# Patient Record
Sex: Female | Born: 1940 | Race: White | Hispanic: No | Marital: Married | Smoking: Former smoker
Health system: Southern US, Community
[De-identification: ages and names within clinical notes are randomized; demographics above are authoritative.]

## PROBLEM LIST (undated history)

## (undated) DIAGNOSIS — N189 Chronic kidney disease, unspecified: Secondary | ICD-10-CM

## (undated) DIAGNOSIS — F32A Depression, unspecified: Secondary | ICD-10-CM

## (undated) DIAGNOSIS — Z9889 Other specified postprocedural states: Secondary | ICD-10-CM

## (undated) DIAGNOSIS — M4302 Spondylolysis, cervical region: Secondary | ICD-10-CM

## (undated) DIAGNOSIS — E8881 Metabolic syndrome: Secondary | ICD-10-CM

## (undated) DIAGNOSIS — E785 Hyperlipidemia, unspecified: Secondary | ICD-10-CM

## (undated) DIAGNOSIS — E88819 Insulin resistance, unspecified: Secondary | ICD-10-CM

## (undated) DIAGNOSIS — J45909 Unspecified asthma, uncomplicated: Secondary | ICD-10-CM

## (undated) DIAGNOSIS — I739 Peripheral vascular disease, unspecified: Secondary | ICD-10-CM

## (undated) DIAGNOSIS — R55 Syncope and collapse: Secondary | ICD-10-CM

## (undated) DIAGNOSIS — M1990 Unspecified osteoarthritis, unspecified site: Secondary | ICD-10-CM

## (undated) DIAGNOSIS — D751 Secondary polycythemia: Secondary | ICD-10-CM

## (undated) DIAGNOSIS — F329 Major depressive disorder, single episode, unspecified: Secondary | ICD-10-CM

## (undated) DIAGNOSIS — I10 Essential (primary) hypertension: Secondary | ICD-10-CM

## (undated) DIAGNOSIS — R112 Nausea with vomiting, unspecified: Secondary | ICD-10-CM

## (undated) DIAGNOSIS — E063 Autoimmune thyroiditis: Secondary | ICD-10-CM

## (undated) DIAGNOSIS — F419 Anxiety disorder, unspecified: Secondary | ICD-10-CM

## (undated) HISTORY — PX: CHOLECYSTECTOMY: SHX55

## (undated) HISTORY — PX: BREAST SURGERY: SHX581

## (undated) HISTORY — PX: APPENDECTOMY: SHX54

## (undated) HISTORY — DX: Essential (primary) hypertension: I10

## (undated) HISTORY — PX: OVARY SURGERY: SHX727

## (undated) HISTORY — PX: TONSILLECTOMY: SUR1361

## (undated) HISTORY — DX: Hyperlipidemia, unspecified: E78.5

## (undated) HISTORY — PX: DENTAL SURGERY: SHX609

---

## 2009-11-30 ENCOUNTER — Encounter: Admission: RE | Admit: 2009-11-30 | Discharge: 2009-11-30 | Payer: Self-pay | Admitting: Internal Medicine

## 2010-04-06 ENCOUNTER — Encounter: Admit: 2010-04-06 | Payer: Self-pay | Admitting: Internal Medicine

## 2011-02-28 ENCOUNTER — Other Ambulatory Visit: Payer: Self-pay | Admitting: Family Medicine

## 2011-02-28 DIAGNOSIS — Z1231 Encounter for screening mammogram for malignant neoplasm of breast: Secondary | ICD-10-CM

## 2011-03-23 ENCOUNTER — Ambulatory Visit: Payer: Self-pay

## 2011-04-05 ENCOUNTER — Ambulatory Visit
Admission: RE | Admit: 2011-04-05 | Discharge: 2011-04-05 | Disposition: A | Discharge status: Home / Self Care | Payer: Federal, State, Local not specified - PPO | Source: Ambulatory Visit | Attending: Family Medicine | Admitting: Family Medicine

## 2011-04-05 DIAGNOSIS — Z1231 Encounter for screening mammogram for malignant neoplasm of breast: Secondary | ICD-10-CM

## 2011-10-13 ENCOUNTER — Other Ambulatory Visit (HOSPITAL_COMMUNITY)
Admission: RE | Admit: 2011-10-13 | Discharge: 2011-10-13 | Disposition: A | Discharge status: Home / Self Care | Payer: Federal, State, Local not specified - PPO | Source: Ambulatory Visit | Attending: Family Medicine | Admitting: Family Medicine

## 2011-10-13 DIAGNOSIS — Z0000 Encounter for general adult medical examination without abnormal findings: Secondary | ICD-10-CM | POA: Insufficient documentation

## 2012-02-23 ENCOUNTER — Other Ambulatory Visit: Payer: Self-pay | Admitting: Family Medicine

## 2012-02-23 DIAGNOSIS — Z780 Asymptomatic menopausal state: Secondary | ICD-10-CM

## 2012-02-23 DIAGNOSIS — M549 Dorsalgia, unspecified: Secondary | ICD-10-CM

## 2012-02-23 DIAGNOSIS — Z1231 Encounter for screening mammogram for malignant neoplasm of breast: Secondary | ICD-10-CM

## 2012-04-11 ENCOUNTER — Ambulatory Visit
Admission: RE | Admit: 2012-04-11 | Discharge: 2012-04-11 | Disposition: A | Discharge status: Home / Self Care | Payer: 59 | Source: Ambulatory Visit | Attending: Family Medicine | Admitting: Family Medicine

## 2012-04-11 ENCOUNTER — Other Ambulatory Visit: Payer: Federal, State, Local not specified - PPO

## 2012-04-11 DIAGNOSIS — Z1231 Encounter for screening mammogram for malignant neoplasm of breast: Secondary | ICD-10-CM

## 2012-12-17 ENCOUNTER — Telehealth: Payer: Self-pay | Admitting: Hematology & Oncology

## 2012-12-17 NOTE — Telephone Encounter (Signed)
Faxed pt referral to Dr. Myna Hidalgo.   Pt lives in area

## 2013-02-14 ENCOUNTER — Telehealth: Payer: Self-pay | Admitting: Hematology & Oncology

## 2013-02-14 NOTE — Telephone Encounter (Signed)
I spoke w NEW PATIENT today to remind them of their appointment with Dr. Myna Hidalgo. Also, advised them to bring all meds and insurance information.  Pt advised she has to be out by 3p. She will have to be at work at 5p.

## 2013-02-17 ENCOUNTER — Other Ambulatory Visit (HOSPITAL_BASED_OUTPATIENT_CLINIC_OR_DEPARTMENT_OTHER): Payer: 59 | Admitting: Lab

## 2013-02-17 ENCOUNTER — Ambulatory Visit: Payer: 59

## 2013-02-17 ENCOUNTER — Ambulatory Visit (HOSPITAL_BASED_OUTPATIENT_CLINIC_OR_DEPARTMENT_OTHER): Payer: 59 | Admitting: Hematology & Oncology

## 2013-02-17 VITALS — BP 169/70 | HR 71 | Temp 98.1°F | Resp 14 | Ht 62.0 in | Wt 179.0 lb

## 2013-02-17 DIAGNOSIS — D45 Polycythemia vera: Secondary | ICD-10-CM

## 2013-02-17 LAB — CBC WITH DIFFERENTIAL (CANCER CENTER ONLY)
BASO#: 0.1 10*3/uL (ref 0.0–0.2)
BASO%: 0.6 % (ref 0.0–2.0)
EOS%: 4 % (ref 0.0–7.0)
Eosinophils Absolute: 0.3 10*3/uL (ref 0.0–0.5)
HCT: 43.5 % (ref 34.8–46.6)
HGB: 14.9 g/dL (ref 11.6–15.9)
LYMPH#: 2.4 10*3/uL (ref 0.9–3.3)
LYMPH%: 30.7 % (ref 14.0–48.0)
MCH: 31.1 pg (ref 26.0–34.0)
MCHC: 34.3 g/dL (ref 32.0–36.0)
MCV: 91 fL (ref 81–101)
MONO#: 0.8 10*3/uL (ref 0.1–0.9)
MONO%: 10.3 % (ref 0.0–13.0)
NEUT#: 4.3 10*3/uL (ref 1.5–6.5)
NEUT%: 54.4 % (ref 39.6–80.0)
Platelets: 275 10*3/uL (ref 145–400)
RBC: 4.79 10*6/uL (ref 3.70–5.32)
RDW: 14.2 % (ref 11.1–15.7)
WBC: 7.8 10*3/uL (ref 3.9–10.0)

## 2013-02-17 LAB — IRON AND TIBC
%SAT: 46 % (ref 20–55)
Iron: 170 ug/dL — ABNORMAL HIGH (ref 42–145)
TIBC: 371 ug/dL (ref 250–470)
UIBC: 201 ug/dL (ref 125–400)

## 2013-02-17 LAB — FERRITIN: Ferritin: 26 ng/mL (ref 10–291)

## 2013-02-17 LAB — CHCC SATELLITE - SMEAR

## 2013-02-17 NOTE — Progress Notes (Signed)
This office note has been dictated.

## 2013-02-18 LAB — LACTATE DEHYDROGENASE: LDH: 148 U/L (ref 94–250)

## 2013-02-18 LAB — ERYTHROPOIETIN: Erythropoietin: 11.2 m[IU]/mL (ref 2.6–18.5)

## 2013-03-11 NOTE — Progress Notes (Signed)
CC:   Regina Fulp, MD  DIAGNOSIS:  Polycythemia vera.  HISTORY OF PRESENT ILLNESS:  Regina [name] is a very charming 72-year-old white female.  She and her husband are from [place].  They have been down here now probably about 4 years.  They have just been seen by I think their family doctor.  She apparently was diagnosed with polycythemia up in [place] back in 2000.  She did not do well with phlebotomies.  She gets severe vasovagal reaction and really has not been phlebotomized.  She sees Dr. Jillyn Hidden at [facility].  It is felt that we needed to have her establish with Hematology down here.  She is feeling well.  She had no problems with headaches.  She has had no issues with the polycythemia.  She has had no burning in the hands or feet.  She has had no nausea or vomiting.  She has had no cough or shortness of breath.  There has been no change in bowel or bladder habits.  She has had no rashes.  There may be some occasional pruritus.  She has had no weight loss or weight gain.  She has had no change in medications.  She really do not have any records from [place].  A lot of the history is provided from the patient as she is very reliable.  PAST MEDICAL HISTORY:  Remarkable for: 1. Hypothyroidism. 2. Insulin resistance. 3. Hypertension.  ALLERGIES: 1. Cephalosporins. 2. Sulfa antibiotics. 3. Naprosyn.  MEDICATIONS:  Xanax 0.25 mg p.o. daily p.r.n., Norvasc 5 mg p.o. daily, Synthroid 0.075 mg p.o. daily, and Glucophage 850 mg p.o. b.i.d.  SOCIAL HISTORY:  Remarkable for remote tobacco use.  She has planned not to smoke for 20 years or more.  There is no significant alcohol use. She currently works at [employer].  FAMILY HISTORY:  Remarkable for I think her mother with breast cancer.  REVIEW OF SYSTEMS:  As stated in history of present illness.  No additional findings noted on a 12-system review.  PHYSICAL EXAMINATION:  General:  This is a  well-developed, well- nourished white female, in no obvious distress.  Vital signs: Temperature of 98.1, pulse 71, respiratory rate 14, blood pressure 169/70, weight is 179 pounds.  Head and Neck:  Normocephalic, atraumatic skull.  There are no ocular or oral lesions.  She has no palpable cervical or supraclavicular lymph nodes.  She has no facial plethora. There is no conjunctival inflammation.  Thyroid is nonpalpable.  Lungs: Clear bilaterally.  Cardiac:  Regular rate and rhythm with a normal S1 and S2.  There are no murmurs, rubs, or bruits.  Abdomen:  Soft.  She has good bowel sounds.  There is no fluid wave.  There is no palpable hepatosplenomegaly.  Back:  No tenderness over the spine, ribs, or hips. Extremities:  Shows no clubbing, cyanosis or edema.  She has good range motion of her joints.  Appears there is good strength in upper and lower extremities.  Skin:  No rashes, ecchymosis, or petechia.  Neurological: Shows no focal neurological deficits.  LABORATORY STUDIES:  White cell count is 7.8, hemoglobin 14.9, hematocrit 43.5, platelet count 275.  MCV is 91.  Peripheral smear shows a normochromic, normocytic population of red blood cells.  There are no nucleated red blood cells.  I see no teardrop cells.  She has no target cells.  There is no schistocytes or spherocytes.  White cells appear normal in morphology and maturation. There are no immature  myeloid or lymphoid forms.  I see no atypical lymphocytes.  There are no blasts.  Platelets are adequate in number and size.  IMPRESSION:  Regina [name] is a very nice 72 year old white female.  She has polycythemia.  She has been pretty asymptomatic with this.  Her blood smear certainly looks unremarkable.  She does not need to be phlebotomized.  Again, she really cannot be phlebotomized because of severe vasovagal reaction that she has.  We will see what her iron studies show.  I will check an erythropoietin level on  her.  She never had a bone marrow biopsy for this.  I do not think that we have to do this by any means.  She definitely has been on aspirin.  I told her that this is a must for her.  I told that 1 baby aspirin clearly has been shown to improve survival with polycythemia as it does tend to help prevent thromboembolic events associated with polycythemia.  She promises that she will take 1 baby aspirin a day.  I told her to make sure she takes it with food.  Again, I do not see that we have to embark upon a major workup for this since this apparently has been diagnosed up in [place].  I would like to get a JAK2 assay on her.  I will do this when we see her back.  For now, we will plan to get her back in 4 months' time.  I think this is reasonable.  I spent a good 45 minutes with Regina [name] and her husband.  It was very nice talking to them about [place].  He is [place] and my family is [place], so we had a lot to talk about there.  Again, we will get her back in 4 months.   ______________________________ Josph Macho, M.D. PRE/MEDQ  D:  02/17/2013  T:  02/22/2013  Job:  [id number]

## 2013-06-13 ENCOUNTER — Telehealth: Payer: Self-pay | Admitting: Hematology & Oncology

## 2013-06-13 NOTE — Telephone Encounter (Signed)
Pt cx 3-16 said she had people coming into town and she would call back to reschedule

## 2013-06-16 ENCOUNTER — Ambulatory Visit: Payer: 59 | Admitting: Hematology & Oncology

## 2013-06-16 ENCOUNTER — Other Ambulatory Visit: Payer: 59 | Admitting: Lab

## 2013-07-04 ENCOUNTER — Other Ambulatory Visit: Payer: Self-pay

## 2013-07-22 ENCOUNTER — Other Ambulatory Visit: Payer: Self-pay

## 2013-07-22 DIAGNOSIS — Z1231 Encounter for screening mammogram for malignant neoplasm of breast: Secondary | ICD-10-CM

## 2013-07-30 ENCOUNTER — Ambulatory Visit
Admission: RE | Admit: 2013-07-30 | Discharge: 2013-07-30 | Disposition: A | Discharge status: Home / Self Care | Payer: 59 | Source: Ambulatory Visit

## 2013-07-30 ENCOUNTER — Encounter (INDEPENDENT_AMBULATORY_CARE_PROVIDER_SITE_OTHER): Payer: Self-pay

## 2013-07-30 DIAGNOSIS — Z1231 Encounter for screening mammogram for malignant neoplasm of breast: Secondary | ICD-10-CM

## 2014-07-20 ENCOUNTER — Ambulatory Visit
Admission: RE | Admit: 2014-07-20 | Discharge: 2014-07-20 | Disposition: A | Discharge status: Home / Self Care | Payer: 59 | Source: Ambulatory Visit | Attending: Family Medicine | Admitting: Family Medicine

## 2014-07-20 ENCOUNTER — Other Ambulatory Visit: Payer: Self-pay | Admitting: Family Medicine

## 2014-07-20 DIAGNOSIS — R05 Cough: Secondary | ICD-10-CM

## 2014-07-20 DIAGNOSIS — R059 Cough, unspecified: Secondary | ICD-10-CM

## 2015-08-06 ENCOUNTER — Other Ambulatory Visit: Payer: Self-pay | Admitting: Family Medicine

## 2015-08-06 DIAGNOSIS — Z1231 Encounter for screening mammogram for malignant neoplasm of breast: Secondary | ICD-10-CM

## 2015-08-23 ENCOUNTER — Ambulatory Visit
Admission: RE | Admit: 2015-08-23 | Discharge: 2015-08-23 | Disposition: A | Discharge status: Home / Self Care | Payer: Federal, State, Local not specified - PPO | Source: Ambulatory Visit | Attending: Family Medicine | Admitting: Family Medicine

## 2015-08-23 DIAGNOSIS — Z1231 Encounter for screening mammogram for malignant neoplasm of breast: Secondary | ICD-10-CM

## 2015-09-14 ENCOUNTER — Other Ambulatory Visit: Payer: Self-pay | Admitting: Family Medicine

## 2015-09-14 ENCOUNTER — Ambulatory Visit
Admission: RE | Admit: 2015-09-14 | Discharge: 2015-09-14 | Disposition: A | Discharge status: Home / Self Care | Payer: Federal, State, Local not specified - PPO | Source: Ambulatory Visit | Attending: Family Medicine | Admitting: Family Medicine

## 2015-09-14 DIAGNOSIS — R52 Pain, unspecified: Secondary | ICD-10-CM

## 2015-09-14 DIAGNOSIS — R059 Cough, unspecified: Secondary | ICD-10-CM

## 2015-09-14 DIAGNOSIS — R05 Cough: Secondary | ICD-10-CM

## 2017-11-05 ENCOUNTER — Other Ambulatory Visit: Payer: Self-pay | Admitting: Family Medicine

## 2017-11-05 DIAGNOSIS — M5412 Radiculopathy, cervical region: Secondary | ICD-10-CM

## 2017-11-06 ENCOUNTER — Encounter: Payer: Self-pay | Admitting: Neurology

## 2017-11-15 ENCOUNTER — Ambulatory Visit
Admission: RE | Admit: 2017-11-15 | Discharge: 2017-11-15 | Disposition: A | Discharge status: Home / Self Care | Payer: Federal, State, Local not specified - PPO | Source: Ambulatory Visit | Attending: Family Medicine | Admitting: Family Medicine

## 2017-11-15 DIAGNOSIS — M5412 Radiculopathy, cervical region: Secondary | ICD-10-CM

## 2017-11-19 ENCOUNTER — Other Ambulatory Visit: Payer: Federal, State, Local not specified - PPO

## 2017-11-21 ENCOUNTER — Other Ambulatory Visit: Payer: Self-pay | Admitting: Neurosurgery

## 2017-11-22 ENCOUNTER — Other Ambulatory Visit: Payer: Self-pay | Admitting: Neurosurgery

## 2017-11-28 NOTE — Pre-Procedure Instructions (Signed)
Regina [name]  11/28/2017      [facility] [address] [place] [address] [place] [address] Phone: [phone] Fax: [phone]    Your procedure is scheduled on Thursday September 5th.  Report to [facility] Admitting at 1030 A.M.  Call this number if you have problems the morning of surgery:  [phone]   Remember:  Do not eat or drink after midnight.     Take these medicines the morning of surgery with A SIP OF WATER    Tylenol (if needed)  Xanax  Debrox ear drops (if needed)  Lexapro  Allegra  Synthroid    WHAT DO I DO ABOUT MY DIABETES MEDICATION?   Marland Kitchen Do not take oral diabetes medicines (pills) the morning of surgery.   How to Manage Your Diabetes Before and After Surgery  Why is it important to control my blood sugar before and after surgery? . Improving blood sugar levels before and after surgery helps healing and can limit problems. . A way of improving blood sugar control is eating a healthy diet by: o  Eating less sugar and carbohydrates o  Increasing activity/exercise o  Talking with your doctor about reaching your blood sugar goals . High blood sugars (greater than 180 mg/dL) can raise your risk of infections and slow your recovery, so you will need to focus on controlling your diabetes during the weeks before surgery. . Make sure that the doctor who takes care of your diabetes knows about your planned surgery including the date and location.  How do I manage my blood sugar before surgery? . Check your blood sugar at least 4 times a day, starting 2 days before surgery, to make sure that the level is not too high or low. o Check your blood sugar the morning of your surgery when you wake up and every 2 hours until you get to the Short Stay unit. . If your blood sugar is less than 70 mg/dL, you will need to treat for low blood sugar: o Do not take insulin. o Treat a low blood  sugar (less than 70 mg/dL) with  cup of clear juice (cranberry or apple), 4 glucose tablets, OR glucose gel. o Recheck blood sugar in 15 minutes after treatment (to make sure it is greater than 70 mg/dL). If your blood sugar is not greater than 70 mg/dL on recheck, call [phone] for further instructions. . Report your blood sugar to the short stay nurse when you get to Short Stay.  . If you are admitted to the hospital after surgery: o Your blood sugar will be checked by the staff and you will probably be given insulin after surgery (instead of oral diabetes medicines) to make sure you have good blood sugar levels. o The goal for blood sugar control after surgery is 80-180 mg/dL.     Do not wear jewelry, make-up or nail polish.  Do not wear lotions, powders, or perfumes, or deodorant.  Do not shave 48 hours prior to surgery.  Men may shave face and neck.  Do not bring valuables to the hospital.  [facility] is not responsible for any belongings or valuables.  Contacts, dentures or bridgework may not be worn into surgery.  Leave your suitcase in the car.  After surgery it may be brought to your room.  For patients admitted to the hospital, discharge time will be determined by your treatment team.  Patients discharged the day of  surgery will not be allowed to drive home.   [facility]- Preparing For Surgery  Before surgery, you can play an important role. Because skin is not sterile, your skin needs to be as free of germs as possible. You can reduce the number of germs on your skin by washing with CHG (chlorahexidine gluconate) Soap before surgery.  CHG is an antiseptic cleaner which kills germs and bonds with the skin to continue killing germs even after washing.    Oral Hygiene is also important to reduce your risk of infection.  Remember - BRUSH YOUR TEETH THE MORNING OF SURGERY WITH YOUR REGULAR TOOTHPASTE  Please do not use if you have an allergy to CHG or antibacterial soaps. If  your skin becomes reddened/irritated stop using the CHG.  Do not shave (including legs and underarms) for at least 48 hours prior to first CHG shower. It is OK to shave your face.  Please follow these instructions carefully.   1. Shower the NIGHT BEFORE SURGERY and the MORNING OF SURGERY with CHG.   2. If you chose to wash your hair, wash your hair first as usual with your normal shampoo.  3. After you shampoo, rinse your hair and body thoroughly to remove the shampoo.  4. Use CHG as you would any other liquid soap. You can apply CHG directly to the skin and wash gently with a scrungie or a clean washcloth.   5. Apply the CHG Soap to your body ONLY FROM THE NECK DOWN.  Do not use on open wounds or open sores. Avoid contact with your eyes, ears, mouth and genitals (private parts). Wash Face and genitals (private parts)  with your normal soap.  6. Wash thoroughly, paying special attention to the area where your surgery will be performed.  7. Thoroughly rinse your body with warm water from the neck down.  8. DO NOT shower/wash with your normal soap after using and rinsing off the CHG Soap.  9. Pat yourself dry with a CLEAN TOWEL.  10. Wear CLEAN PAJAMAS to bed the night before surgery, wear comfortable clothes the morning of surgery  11. Place CLEAN SHEETS on your bed the night of your first shower and DO NOT SLEEP WITH PETS.    Day of Surgery:  Do not apply any deodorants/lotions.  Please wear clean clothes to the hospital/surgery center.   Remember to brush your teeth WITH YOUR REGULAR TOOTHPASTE.    Please read over the following fact sheets that you were given. Coughing and Deep Breathing, MRSA Information and Surgical Site Infection Prevention

## 2017-11-29 ENCOUNTER — Encounter (HOSPITAL_COMMUNITY): Payer: Self-pay

## 2017-11-29 ENCOUNTER — Other Ambulatory Visit: Payer: Self-pay

## 2017-11-29 ENCOUNTER — Encounter (HOSPITAL_COMMUNITY)
Admission: RE | Admit: 2017-11-29 | Discharge: 2017-11-29 | Disposition: A | Discharge status: Home / Self Care | Payer: Federal, State, Local not specified - PPO | Source: Ambulatory Visit | Attending: Neurosurgery | Admitting: Neurosurgery

## 2017-11-29 DIAGNOSIS — Z79890 Hormone replacement therapy (postmenopausal): Secondary | ICD-10-CM | POA: Insufficient documentation

## 2017-11-29 DIAGNOSIS — Z87891 Personal history of nicotine dependence: Secondary | ICD-10-CM | POA: Diagnosis not present

## 2017-11-29 DIAGNOSIS — F419 Anxiety disorder, unspecified: Secondary | ICD-10-CM | POA: Insufficient documentation

## 2017-11-29 DIAGNOSIS — Z79899 Other long term (current) drug therapy: Secondary | ICD-10-CM | POA: Diagnosis not present

## 2017-11-29 DIAGNOSIS — J45909 Unspecified asthma, uncomplicated: Secondary | ICD-10-CM | POA: Insufficient documentation

## 2017-11-29 DIAGNOSIS — Z01812 Encounter for preprocedural laboratory examination: Secondary | ICD-10-CM | POA: Diagnosis present

## 2017-11-29 DIAGNOSIS — Z7982 Long term (current) use of aspirin: Secondary | ICD-10-CM | POA: Diagnosis not present

## 2017-11-29 DIAGNOSIS — E063 Autoimmune thyroiditis: Secondary | ICD-10-CM | POA: Insufficient documentation

## 2017-11-29 DIAGNOSIS — F329 Major depressive disorder, single episode, unspecified: Secondary | ICD-10-CM | POA: Diagnosis not present

## 2017-11-29 DIAGNOSIS — I10 Essential (primary) hypertension: Secondary | ICD-10-CM | POA: Diagnosis not present

## 2017-11-29 DIAGNOSIS — M47812 Spondylosis without myelopathy or radiculopathy, cervical region: Secondary | ICD-10-CM | POA: Insufficient documentation

## 2017-11-29 DIAGNOSIS — Z7984 Long term (current) use of oral hypoglycemic drugs: Secondary | ICD-10-CM | POA: Diagnosis not present

## 2017-11-29 HISTORY — DX: Other specified postprocedural states: Z98.890

## 2017-11-29 HISTORY — DX: Unspecified asthma, uncomplicated: J45.909

## 2017-11-29 HISTORY — DX: Depression, unspecified: F32.A

## 2017-11-29 HISTORY — DX: Spondylolysis, cervical region: M43.02

## 2017-11-29 HISTORY — DX: Nausea with vomiting, unspecified: R11.2

## 2017-11-29 HISTORY — DX: Unspecified osteoarthritis, unspecified site: M19.90

## 2017-11-29 HISTORY — DX: Anxiety disorder, unspecified: F41.9

## 2017-11-29 HISTORY — DX: Major depressive disorder, single episode, unspecified: F32.9

## 2017-11-29 HISTORY — DX: Metabolic syndrome: E88.81

## 2017-11-29 HISTORY — DX: Insulin resistance, unspecified: E88.819

## 2017-11-29 HISTORY — DX: Autoimmune thyroiditis: E06.3

## 2017-11-29 LAB — CBC
HCT: 38.6 % (ref 36.0–46.0)
Hemoglobin: 12.8 g/dL (ref 12.0–15.0)
MCH: 28.6 pg (ref 26.0–34.0)
MCHC: 33.2 g/dL (ref 30.0–36.0)
MCV: 86.4 fL (ref 78.0–100.0)
Platelets: 344 10*3/uL (ref 150–400)
RBC: 4.47 MIL/uL (ref 3.87–5.11)
RDW: 14.5 % (ref 11.5–15.5)
WBC: 7.7 10*3/uL (ref 4.0–10.5)

## 2017-11-29 LAB — BASIC METABOLIC PANEL
Anion gap: 13 (ref 5–15)
BUN: 13 mg/dL (ref 8–23)
CO2: 27 mmol/L (ref 22–32)
Calcium: 9.5 mg/dL (ref 8.9–10.3)
Chloride: 88 mmol/L — ABNORMAL LOW (ref 98–111)
Creatinine, Ser: 0.86 mg/dL (ref 0.44–1.00)
GFR calc Af Amer: >60 mL/min (ref >60)
GFR calc non Af Amer: >60 mL/min (ref >60)
Glucose, Bld: 95 mg/dL (ref 70–99)
Potassium: 3.7 mmol/L (ref 3.5–5.1)
Sodium: 128 mmol/L — ABNORMAL LOW (ref 135–145)

## 2017-11-29 LAB — SURGICAL PCR SCREEN
MRSA, PCR: NEGATIVE
Staphylococcus aureus: NEGATIVE

## 2017-11-29 LAB — TYPE AND SCREEN
ABO/RH(D): O POS
Antibody Screen: NEGATIVE

## 2017-11-29 LAB — ABO/RH: ABO/RH(D): O POS

## 2017-11-29 NOTE — Progress Notes (Signed)
PCP - Maurice Small MD  EKG- 11/29/17  Aspirin Instructions: Last does 11/29/17  Anesthesia review: none  Patient denies shortness of breath, fever, cough and chest pain at PAT appointment   Patient verbalized understanding of instructions that were given to them at the PAT appointment. Patient was also instructed that they will need to review over the PAT instructions again at home before surgery.

## 2017-11-30 ENCOUNTER — Encounter (HOSPITAL_COMMUNITY): Payer: Self-pay

## 2017-11-30 NOTE — Progress Notes (Signed)
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 Pertinent hx includes PONV, Hashimoto's, Anxiety, Asthma, Depression, HTN.  On PAT labs pt with mild hypoNA 128. No history for comparison in Epic or care everywhere. Faxed results to PCP Dr. Maurice Small for input on whether or not this is aberrant from pt baseline. Results also called to Dr. Cleotilde Neer office.  Will order istat4 DOS. Anticipate she can proceed with surgery as planned barring acute status change and labs acceptable on DOS.  VS: BP 124/61   Pulse 83   Temp 36.8 C   Resp 20   Ht 5\' 2"  (1.575 m)   Wt 71.9 kg   SpO2 100%   BMI 28.99 kg/m   PROVIDERS: Maurice Small, MD is PCP   LABS: HypoNA. istat4 ordered DOS. Dr. Kathyrn Sheriff and pt PCP notified (all labs ordered are listed, but only abnormal results are displayed)  Labs Reviewed  BASIC METABOLIC PANEL - Abnormal; Notable for the following components:      Result Value   Sodium 128 (*)    Chloride 88 (*)    All other components within normal limits  SURGICAL PCR SCREEN  CBC  TYPE AND SCREEN  ABO/RH     IMAGES:  CHEST  2 VIEW 09/14/15  COMPARISON:  July 20, 2014  FINDINGS: There is no edema or consolidation. The heart size and pulmonary vascularity are normal. No adenopathy. There is degenerative change in the thoracic spine.  IMPRESSION: No edema or consolidation.  EKG: 11/29/17: Normal sinus rhythm. Nonspecific ST abnormality  CV: N/A  Past  Medical History:  Diagnosis Date  . Anxiety   . Arthritis    neck  . Asthma   . Cervical spondylolysis   . Cervical spondylolysis   . Depression   . Hashimoto's disease   . Insulin resistance   . PONV (postoperative nausea and vomiting)    hasn't had surgery since 1993    Past Surgical History:  Procedure Laterality Date  . APPENDECTOMY    . BREAST SURGERY     left biopsy  . CESAREAN SECTION     x2  . CHOLECYSTECTOMY    . DENTAL SURGERY     teeth removal  . OVARY SURGERY     ovarian wedge  . TONSILLECTOMY      MEDICATIONS: . triamcinolone cream (KENALOG) 0.1 %  . acetaminophen (TYLENOL) 500 MG tablet  . albuterol (PROVENTIL HFA;VENTOLIN HFA) 108 (90 Base) MCG/ACT inhaler  . ALPRAZolam (XANAX) 0.25 MG tablet  . APPLE CIDER VINEGAR PO  . aspirin EC 325 MG tablet  . carbamide peroxide (DEBROX) 6.5 % OTIC solution  . escitalopram (LEXAPRO) 10 MG tablet  . fexofenadine (ALLEGRA) 180 MG tablet  . ibuprofen (ADVIL,MOTRIN) 200 MG tablet  . levothyroxine (SYNTHROID) 75 MCG tablet  . metaxalone (SKELAXIN) 800 MG tablet  . metFORMIN (GLUCOPHAGE) 850 MG tablet  . montelukast (SINGULAIR) 10 MG tablet  . triamterene-hydrochlorothiazide (MAXZIDE-25) 37.5-25 MG tablet   No current  facility-administered medications for this encounter.     Wynonia Musty [facility] Short Stay Center/Anesthesiology Phone [phone] 11/30/2017 10:39 AM

## 2017-12-01 ENCOUNTER — Inpatient Hospital Stay (HOSPITAL_COMMUNITY)
Admission: EM | Admit: 2017-12-01 | Discharge: 2017-12-10 | DRG: 472 | Disposition: A | Discharge status: Rehabilitation Facility | Payer: Medicare Other | Attending: Internal Medicine | Admitting: Internal Medicine

## 2017-12-01 ENCOUNTER — Emergency Department (HOSPITAL_COMMUNITY): Payer: Medicare Other

## 2017-12-01 ENCOUNTER — Encounter (HOSPITAL_COMMUNITY): Payer: Self-pay

## 2017-12-01 DIAGNOSIS — F329 Major depressive disorder, single episode, unspecified: Secondary | ICD-10-CM | POA: Diagnosis present

## 2017-12-01 DIAGNOSIS — E119 Type 2 diabetes mellitus without complications: Secondary | ICD-10-CM | POA: Diagnosis present

## 2017-12-01 DIAGNOSIS — E063 Autoimmune thyroiditis: Secondary | ICD-10-CM | POA: Diagnosis present

## 2017-12-01 DIAGNOSIS — R1310 Dysphagia, unspecified: Secondary | ICD-10-CM | POA: Diagnosis not present

## 2017-12-01 DIAGNOSIS — E222 Syndrome of inappropriate secretion of antidiuretic hormone: Secondary | ICD-10-CM | POA: Diagnosis present

## 2017-12-01 DIAGNOSIS — M5000 Cervical disc disorder with myelopathy, unspecified cervical region: Secondary | ICD-10-CM

## 2017-12-01 DIAGNOSIS — E871 Hypo-osmolality and hyponatremia: Secondary | ICD-10-CM | POA: Diagnosis present

## 2017-12-01 DIAGNOSIS — E88819 Insulin resistance, unspecified: Secondary | ICD-10-CM | POA: Diagnosis present

## 2017-12-01 DIAGNOSIS — R55 Syncope and collapse: Secondary | ICD-10-CM | POA: Diagnosis not present

## 2017-12-01 DIAGNOSIS — Z79899 Other long term (current) drug therapy: Secondary | ICD-10-CM

## 2017-12-01 DIAGNOSIS — J45909 Unspecified asthma, uncomplicated: Secondary | ICD-10-CM

## 2017-12-01 DIAGNOSIS — E8881 Metabolic syndrome: Secondary | ICD-10-CM | POA: Diagnosis present

## 2017-12-01 DIAGNOSIS — Z79890 Hormone replacement therapy (postmenopausal): Secondary | ICD-10-CM

## 2017-12-01 DIAGNOSIS — Z882 Allergy status to sulfonamides status: Secondary | ICD-10-CM

## 2017-12-01 DIAGNOSIS — Z886 Allergy status to analgesic agent status: Secondary | ICD-10-CM

## 2017-12-01 DIAGNOSIS — W1830XA Fall on same level, unspecified, initial encounter: Secondary | ICD-10-CM | POA: Diagnosis present

## 2017-12-01 DIAGNOSIS — Z23 Encounter for immunization: Secondary | ICD-10-CM

## 2017-12-01 DIAGNOSIS — R2681 Unsteadiness on feet: Secondary | ICD-10-CM | POA: Diagnosis present

## 2017-12-01 DIAGNOSIS — Z880 Allergy status to penicillin: Secondary | ICD-10-CM

## 2017-12-01 DIAGNOSIS — E039 Hypothyroidism, unspecified: Secondary | ICD-10-CM | POA: Diagnosis present

## 2017-12-01 DIAGNOSIS — I10 Essential (primary) hypertension: Secondary | ICD-10-CM

## 2017-12-01 DIAGNOSIS — M2578 Osteophyte, vertebrae: Secondary | ICD-10-CM | POA: Diagnosis present

## 2017-12-01 DIAGNOSIS — M4302 Spondylolysis, cervical region: Secondary | ICD-10-CM

## 2017-12-01 DIAGNOSIS — Z888 Allergy status to other drugs, medicaments and biological substances status: Secondary | ICD-10-CM

## 2017-12-01 DIAGNOSIS — E860 Dehydration: Secondary | ICD-10-CM | POA: Diagnosis present

## 2017-12-01 DIAGNOSIS — M436 Torticollis: Secondary | ICD-10-CM | POA: Diagnosis present

## 2017-12-01 DIAGNOSIS — Z7982 Long term (current) use of aspirin: Secondary | ICD-10-CM

## 2017-12-01 DIAGNOSIS — Z87891 Personal history of nicotine dependence: Secondary | ICD-10-CM

## 2017-12-01 DIAGNOSIS — M1990 Unspecified osteoarthritis, unspecified site: Secondary | ICD-10-CM | POA: Diagnosis present

## 2017-12-01 DIAGNOSIS — D72829 Elevated white blood cell count, unspecified: Secondary | ICD-10-CM | POA: Diagnosis present

## 2017-12-01 DIAGNOSIS — M4712 Other spondylosis with myelopathy, cervical region: Principal | ICD-10-CM | POA: Diagnosis present

## 2017-12-01 DIAGNOSIS — J449 Chronic obstructive pulmonary disease, unspecified: Secondary | ICD-10-CM | POA: Diagnosis present

## 2017-12-01 DIAGNOSIS — Z7984 Long term (current) use of oral hypoglycemic drugs: Secondary | ICD-10-CM

## 2017-12-01 DIAGNOSIS — Y92002 Bathroom of unspecified non-institutional (private) residence single-family (private) house as the place of occurrence of the external cause: Secondary | ICD-10-CM

## 2017-12-01 DIAGNOSIS — F419 Anxiety disorder, unspecified: Secondary | ICD-10-CM | POA: Diagnosis present

## 2017-12-01 DIAGNOSIS — M4802 Spinal stenosis, cervical region: Secondary | ICD-10-CM | POA: Diagnosis present

## 2017-12-01 DIAGNOSIS — Z419 Encounter for procedure for purposes other than remedying health state, unspecified: Secondary | ICD-10-CM

## 2017-12-01 DIAGNOSIS — Z881 Allergy status to other antibiotic agents status: Secondary | ICD-10-CM

## 2017-12-01 DIAGNOSIS — M50222 Other cervical disc displacement at C5-C6 level: Secondary | ICD-10-CM | POA: Diagnosis present

## 2017-12-01 LAB — CBC WITH DIFFERENTIAL/PLATELET
Abs Immature Granulocytes: 0 10*3/uL (ref 0.0–0.1)
Basophils Absolute: 0 10*3/uL (ref 0.0–0.1)
Basophils Relative: 0 %
Eosinophils Absolute: 0 10*3/uL (ref 0.0–0.7)
Eosinophils Relative: 0 %
HCT: 38.2 % (ref 36.0–46.0)
Hemoglobin: 12.9 g/dL (ref 12.0–15.0)
Immature Granulocytes: 0 %
Lymphocytes Relative: 6 %
Lymphs Abs: 0.7 10*3/uL (ref 0.7–4.0)
MCH: 28.6 pg (ref 26.0–34.0)
MCHC: 33.8 g/dL (ref 30.0–36.0)
MCV: 84.7 fL (ref 78.0–100.0)
Monocytes Absolute: 1.1 10*3/uL — ABNORMAL HIGH (ref 0.1–1.0)
Monocytes Relative: 9 %
Neutro Abs: 9.9 10*3/uL — ABNORMAL HIGH (ref 1.7–7.7)
Neutrophils Relative %: 85 %
Platelets: 314 10*3/uL (ref 150–400)
RBC: 4.51 MIL/uL (ref 3.87–5.11)
RDW: 14.3 % (ref 11.5–15.5)
WBC: 11.8 10*3/uL — ABNORMAL HIGH (ref 4.0–10.5)

## 2017-12-01 LAB — COMPREHENSIVE METABOLIC PANEL
ALT: 12 U/L (ref 0–44)
AST: 17 U/L (ref 15–41)
Albumin: 3.8 g/dL (ref 3.5–5.0)
Alkaline Phosphatase: 59 U/L (ref 38–126)
Anion gap: 14 (ref 5–15)
BUN: 14 mg/dL (ref 8–23)
CO2: 23 mmol/L (ref 22–32)
Calcium: 9.1 mg/dL (ref 8.9–10.3)
Chloride: 85 mmol/L — ABNORMAL LOW (ref 98–111)
Creatinine, Ser: 0.8 mg/dL (ref 0.44–1.00)
GFR calc Af Amer: >60 mL/min (ref >60)
GFR calc non Af Amer: >60 mL/min (ref >60)
Glucose, Bld: 131 mg/dL — ABNORMAL HIGH (ref 70–99)
Potassium: 3.5 mmol/L (ref 3.5–5.1)
Sodium: 122 mmol/L — ABNORMAL LOW (ref 135–145)
Total Bilirubin: 0.7 mg/dL (ref 0.3–1.2)
Total Protein: 6.2 g/dL — ABNORMAL LOW (ref 6.5–8.1)

## 2017-12-01 LAB — I-STAT CHEM 8, ED
BUN: 17 mg/dL (ref 8–23)
Calcium, Ion: 1.1 mmol/L — ABNORMAL LOW (ref 1.15–1.40)
Chloride: 87 mmol/L — ABNORMAL LOW (ref 98–111)
Creatinine, Ser: 0.7 mg/dL (ref 0.44–1.00)
Glucose, Bld: 127 mg/dL — ABNORMAL HIGH (ref 70–99)
HCT: 40 % (ref 36.0–46.0)
Hemoglobin: 13.6 g/dL (ref 12.0–15.0)
Potassium: 3.5 mmol/L (ref 3.5–5.1)
Sodium: 124 mmol/L — ABNORMAL LOW (ref 135–145)
TCO2: 28 mmol/L (ref 22–32)

## 2017-12-01 LAB — CK: Total CK: 96 U/L (ref 38–234)

## 2017-12-01 LAB — I-STAT TROPONIN, ED: Troponin i, poc: 0.02 ng/mL (ref 0.00–0.08)

## 2017-12-01 LAB — TSH: TSH: 0.895 u[IU]/mL (ref 0.350–4.500)

## 2017-12-01 MED ORDER — DEXAMETHASONE SODIUM PHOSPHATE 4 MG/ML IJ SOLN
4.0000 mg | Freq: Once | INTRAMUSCULAR | Status: AC
  Administered 2017-12-01: 4 mg via INTRAVENOUS
  Filled 2017-12-01: qty 1

## 2017-12-01 MED ORDER — METAXALONE 800 MG PO TABS
800.0000 mg | ORAL_TABLET | Freq: Three times a day (TID) | ORAL | Status: DC | PRN
  Administered 2017-12-03 – 2017-12-10 (×11): 800 mg via ORAL
  Filled 2017-12-01 (×16): qty 1

## 2017-12-01 MED ORDER — LEVOTHYROXINE SODIUM 75 MCG PO TABS
75.0000 ug | ORAL_TABLET | Freq: Every day | ORAL | Status: DC
  Administered 2017-12-02 – 2017-12-10 (×9): 75 ug via ORAL
  Filled 2017-12-01 (×9): qty 1

## 2017-12-01 MED ORDER — ACETAMINOPHEN 500 MG PO TABS
1000.0000 mg | ORAL_TABLET | Freq: Four times a day (QID) | ORAL | Status: DC | PRN
  Administered 2017-12-02 – 2017-12-09 (×6): 1000 mg via ORAL
  Filled 2017-12-01 (×7): qty 2

## 2017-12-01 MED ORDER — ALBUTEROL SULFATE (2.5 MG/3ML) 0.083% IN NEBU
3.0000 mL | INHALATION_SOLUTION | Freq: Four times a day (QID) | RESPIRATORY_TRACT | Status: DC | PRN
  Administered 2017-12-04 (×2): 3 mL via RESPIRATORY_TRACT
  Filled 2017-12-01 (×2): qty 3

## 2017-12-01 MED ORDER — SODIUM CHLORIDE 0.9 % IV BOLUS
1000.0000 mL | Freq: Once | INTRAVENOUS | Status: AC
  Administered 2017-12-01: 1000 mL via INTRAVENOUS

## 2017-12-01 MED ORDER — DEXAMETHASONE SODIUM PHOSPHATE 10 MG/ML IJ SOLN
4.0000 mg | Freq: Four times a day (QID) | INTRAMUSCULAR | Status: DC
  Administered 2017-12-01 – 2017-12-03 (×7): 4 mg via INTRAVENOUS
  Filled 2017-12-01 (×6): qty 1

## 2017-12-01 MED ORDER — ENOXAPARIN SODIUM 40 MG/0.4ML ~~LOC~~ SOLN
40.0000 mg | SUBCUTANEOUS | Status: DC
  Administered 2017-12-01 – 2017-12-05 (×5): 40 mg via SUBCUTANEOUS
  Filled 2017-12-01 (×5): qty 0.4

## 2017-12-01 MED ORDER — LORATADINE 10 MG PO TABS
10.0000 mg | ORAL_TABLET | Freq: Every day | ORAL | Status: DC
  Administered 2017-12-03: 10 mg via ORAL
  Filled 2017-12-01 (×2): qty 1

## 2017-12-01 MED ORDER — ALPRAZOLAM 0.25 MG PO TABS
0.2500 mg | ORAL_TABLET | Freq: Three times a day (TID) | ORAL | Status: DC
  Administered 2017-12-01 – 2017-12-02 (×2): 0.25 mg via ORAL
  Filled 2017-12-01 (×2): qty 1

## 2017-12-01 MED ORDER — SODIUM CHLORIDE 0.9 % IV SOLN
INTRAVENOUS | Status: DC
  Administered 2017-12-01 – 2017-12-04 (×3): via INTRAVENOUS

## 2017-12-01 MED ORDER — MONTELUKAST SODIUM 10 MG PO TABS
10.0000 mg | ORAL_TABLET | Freq: Every day | ORAL | Status: DC
  Administered 2017-12-01 – 2017-12-09 (×9): 10 mg via ORAL
  Filled 2017-12-01 (×9): qty 1

## 2017-12-01 NOTE — H&P (Signed)
History and Physical    [name] PFX:[id number] DOB: 09-11-1940 DOA: 12/01/2017  PCP: Maurice Small, MD Patient coming from:  Home.   I have personally briefly reviewed patient's old medical records in [place]  Chief Complaint: syncope.   HPI: Regina [name] is a 77 y.o. female with medical history significant of cervical spondylosis, was seen by neuro surgery last week and was tentatively on schedule for surgery on Thursday, comes in for an episode of syncope, . She reports she got up to go to the bathroom last night and felt dizzy and passed out. She denies any chest pain or sob, palpitations, urinary symptoms, fever , chills, nausea, vomiting, or diarrhea. She reports headache, generalized weakness, stiff neck, and weakness of the upper extremities.    ED Course: on arrival to ED, she underwent CT head and neck  Which did not show any acute changes.  Lab work significant for hyponatremia , and mild leukocytosis.  Neuro surgery consulted and recommended IV decadron.  She was referred to medical service for evaluation and management of hyponatremia  Review of Systems: As per HPI otherwise 10 point review of systems negative.  .    Past Medical History:  Diagnosis Date  . Anxiety   . Arthritis    neck  . Asthma   . Cervical spondylolysis   . Cervical spondylolysis   . Depression   . Hashimoto's disease   . Insulin resistance   . PONV (postoperative nausea and vomiting)    hasn't had surgery since 1993    Past Surgical History:  Procedure Laterality Date  . APPENDECTOMY    . BREAST SURGERY     left biopsy  . CESAREAN SECTION     x2  . CHOLECYSTECTOMY    . DENTAL SURGERY     teeth removal  . OVARY SURGERY     ovarian wedge  . TONSILLECTOMY       reports that she quit smoking about 37 years ago. She has never used smokeless tobacco. She reports that she drinks alcohol. She reports that she does not use drugs.  Allergies    Allergen Reactions  . Ceclor [Cefaclor] Rash  . Naproxen Hives and Swelling    Mouth swelling  . Sulfa Antibiotics Nausea And Vomiting and Other (See Comments)    Syncope/headaches   . Amlodipine Cough  . Augmentin [Amoxicillin-Pot Clavulanate] Nausea And Vomiting    Has patient had a PCN reaction causing immediate rash, facial/tongue/throat swelling, SOB or lightheadedness with hypotension: No Has patient had a PCN reaction causing severe rash involving mucus membranes or skin necrosis: No Has patient had a PCN reaction that required hospitalization: No Has patient had a PCN reaction occurring within the last 10 years: No If all of the above answers are "NO", then may proceed with Cephalosporin use.   . Other Nausea And Vomiting    "pain medications" or opoids/Anesthesia  . Valsartan Cough    History reviewed. No pertinent family history.   Prior to Admission medications   Medication Sig Start Date End Date Taking? Authorizing Provider  acetaminophen (TYLENOL) 500 MG tablet Take 1,000 mg by mouth every 6 (six) hours as needed (for headaches.).   Yes [provider]  albuterol (PROVENTIL HFA;VENTOLIN HFA) 108 (90 Base) MCG/ACT inhaler Inhale 1-2 puffs into the lungs every 6 (six) hours as needed for wheezing or shortness of breath (asthma/coughing).   Yes [provider]  ALPRAZolam (XANAX) 0.25 MG tablet Take 0.25 mg  by mouth 3 (three) times daily.    Yes [provider]  carbamide peroxide (DEBROX) 6.5 % OTIC solution Place 5 drops into both ears daily as needed (wax build up).   Yes [provider]  CHERATUSSIN AC 100-10 MG/5ML syrup Take 5 mLs by mouth 4 (four) times daily. For 5 days. 11/23/17  Yes [provider]  diphenhydrAMINE (BENADRYL) 25 MG tablet Take 25 mg by mouth every 6 (six) hours as needed for allergies.   Yes [provider]  escitalopram (LEXAPRO) 10 MG tablet Take 10 mg by mouth at bedtime.   Yes [provider]  fexofenadine (ALLEGRA) 180 MG tablet Take 180 mg by mouth daily.   Yes [provider]  ibuprofen (ADVIL,MOTRIN) 200 MG tablet Take 600 mg by mouth every 8 (eight) hours as needed (for pain).   Yes [provider]  levothyroxine (SYNTHROID) 75 MCG tablet Take 75 mcg by mouth daily before breakfast.   Yes [provider]  metaxalone (SKELAXIN) 800 MG tablet Take 800 mg by mouth 3 (three) times daily as needed for muscle spasms.   Yes [provider]  metFORMIN (GLUCOPHAGE) 850 MG tablet Take 850 mg by mouth 2 (two) times daily.    Yes [provider]  montelukast (SINGULAIR) 10 MG tablet Take 10 mg by mouth at bedtime. 09/07/17  Yes [provider]  triamcinolone cream (KENALOG) 0.1 % Apply 1 application topically as needed (once a week).   Yes [provider]  triamterene-hydrochlorothiazide (MAXZIDE-25) 37.5-25 MG tablet Take 1 tablet by mouth every morning.  11/19/17  Yes [provider]  APPLE CIDER VINEGAR PO Take 1 capsule by mouth daily with lunch.    [provider]  aspirin EC 325 MG tablet Take 325 mg by mouth at bedtime.    [provider]    Physical Exam: Vitals:   12/01/17 1600 12/01/17 1615 12/01/17 1630 12/01/17 1645  BP: (!) 154/66 (!) 154/63 (!) 147/62 (!) 146/58  Pulse: 72 64 72 75  Resp: 17 18 17 16   Temp:      TempSrc:      SpO2: 99% 99% 98% 98%  Weight:      Height:        Constitutional: NAD, calm, comfortable Vitals:   12/01/17 1600 12/01/17 1615 12/01/17 1630 12/01/17 1645  BP: (!) 154/66 (!) 154/63 (!) 147/62 (!) 146/58  Pulse: 72 64 72 75  Resp: 17 18 17 16   Temp:      TempSrc:      SpO2: 99% 99% 98% 98%  Weight:      Height:       Eyes: PERRL, lids and conjunctivae normal ENMT: Mucous membranes are dry.  Neck: normal, supple, no masses, no thyromegaly Respiratory: clear to auscultation bilaterally, no wheezing, no crackles. Normal respiratory  effort. No accessory muscle use.  Cardiovascular: Regular rate and rhythm, no murmurs / rubs / gallops.  No emity edema. 2+ pedal pulses.  Abdomen: no tenderness, no masses palpated. No hepatosplenomegaly. Bowel sounds positive.  Musculoskeletal: no clubbing / cyanosis.  Skin: no rashes, lesions, ulcers. No induration Neurologic: CN 2-12 grossly intact. Sensation intact, DTR normal. Strength 5/5 in all 4.  Psychiatric: Normal judgment and insight. Alert and oriented x 3. Normal mood.     Labs on Admission: I have personally reviewed following labs and imaging studies  CBC: Recent Labs  Lab 11/29/17 1337 12/01/17 1202 12/01/17 1209  WBC 7.7 11.8*  --  NEUTROABS  --  9.9*  --   HGB 12.8 12.9 13.6  HCT 38.6 38.2 40.0  MCV 86.4 84.7  --   PLT 344 314  --    Basic Metabolic Panel: Recent Labs  Lab 11/29/17 1337 12/01/17 1202 12/01/17 1209  NA 128* 122* 124*  K 3.7 3.5 3.5  CL 88* 85* 87*  CO2 27 23  --   GLUCOSE 95 131* 127*  BUN 13 14 17   CREATININE 0.86 0.80 0.70  CALCIUM 9.5 9.1  --    GFR: Estimated Creatinine Clearance: 54.6 mL/min (by C-G formula based on SCr of 0.7 mg/dL). Liver Function Tests: Recent Labs  Lab 12/01/17 1202  AST 17  ALT 12  ALKPHOS 59  BILITOT 0.7  PROT 6.2*  ALBUMIN 3.8   No results for input(s): LIPASE, AMYLASE in the last 168 hours. No results for input(s): AMMONIA in the last 168 hours. Coagulation Profile: No results for input(s): INR, PROTIME in the last 168 hours. Cardiac Enzymes: Recent Labs  Lab 12/01/17 1202  CKTOTAL 96   BNP (last 3 results) No results for input(s): PROBNP in the last 8760 hours. HbA1C: No results for input(s): HGBA1C in the last 72 hours. CBG: No results for input(s): GLUCAP in the last 168 hours. Lipid Profile: No results for input(s): CHOL, HDL, LDLCALC, TRIG, CHOLHDL, LDLDIRECT in the last 72 hours. Thyroid Function Tests: No results for input(s): TSH, T4TOTAL, FREET4, T3FREE, THYROIDAB in the  last 72 hours. Anemia Panel: No results for input(s): VITAMINB12, FOLATE, FERRITIN, TIBC, IRON, RETICCTPCT in the last 72 hours. Urine analysis: No results found for: COLORURINE, APPEARANCEUR, LABSPEC, PHURINE, GLUCOSEU, HGBUR, BILIRUBINUR, KETONESUR, PROTEINUR, UROBILINOGEN, NITRITE, LEUKOCYTESUR  Radiological Exams on Admission: Dg Chest 2 View  Result Date: 12/01/2017 CLINICAL DATA:  Syncopal episode earlier this morning at approximately 1:30 a.m., causing a fall in her bathroom. Acute chest pain. EXAM: CHEST - 2 VIEW COMPARISON:  09/14/2015, 07/20/2014. FINDINGS: AP SEMI-ERECT and LATERAL images were obtained. Cardiac silhouette normal in size, unchanged. Thoracic aorta atherosclerotic, unchanged. Hilar and mediastinal contours otherwise unremarkable. Lungs clear. Bronchovascular markings normal. Pulmonary vascularity normal. No visible pleural effusions. No pneumothorax. Degenerative changes involving the thoracic spine. IMPRESSION: No acute cardiopulmonary disease.  Stable examination. Electronically Signed   By: Evangeline Dakin M.D.   On: 12/01/2017 13:12   Ct Head Wo Contrast  Result Date: 12/01/2017 CLINICAL DATA:  Syncopal episode at 1:30 a.m. last night resulting in a fall and blow to the left side of the head. Initial encounter. EXAM: CT HEAD WITHOUT CONTRAST CT CERVICAL SPINE WITHOUT CONTRAST TECHNIQUE: Multidetector CT imaging of the head and cervical spine was performed following the standard protocol without intravenous contrast. Multiplanar CT image reconstructions of the cervical spine were also generated. COMPARISON:  Cervical spine MRI 11/15/2017. FINDINGS: CT HEAD FINDINGS Brain: No evidence of acute infarction, hemorrhage, hydrocephalus, extra-axial collection or mass lesion/mass effect. Extensive hypoattenuation in the periventricular and subcortical deep white matter consistent chronic microvascular ischemic change is noted. Vascular: No hyperdense vessel or unexpected  calcification. Skull: Intact. Sinuses/Orbits: Mucosal thickening is seen in the right sphenoid sinus. Mild right ethmoid air cell disease also noted. Other: None. CT CERVICAL SPINE FINDINGS Alignment: Trace facet mediated anterolisthesis C3 and C4 noted, unchanged. Skull base and vertebrae: No acute fracture. No primary bone lesion or focal pathologic process. Soft tissues and spinal canal: No prevertebral fluid or swelling. No visible canal hematoma. Disc levels: Severe multilevel degenerative disc disease identified as seen on prior  MRI. Upper chest: Lung apices clear. Other: None. IMPRESSION: No acute abnormality head or cervical spine. 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 Electronically Signed   By: Inge Rise M.D.   On: 12/01/2017 13:56   Ct Cervical Spine Wo Contrast  Result Date: 12/01/2017 CLINICAL DATA:  Syncopal episode at 1:30 a.m. last night resulting in a fall and blow to the left side of the head. Initial encounter. EXAM: CT HEAD WITHOUT CONTRAST CT CERVICAL SPINE WITHOUT CONTRAST TECHNIQUE: Multidetector CT imaging of the head and cervical spine was performed following the standard protocol without intravenous contrast. Multiplanar CT image reconstructions of the cervical spine were also generated. COMPARISON:  Cervical spine MRI 11/15/2017. FINDINGS: CT HEAD FINDINGS Brain: No evidence of acute infarction, hemorrhage, hydrocephalus, extra-axial collection or mass lesion/mass effect. Extensive hypoattenuation in the periventricular and subcortical deep white matter consistent chronic microvascular ischemic change is noted. Vascular: No hyperdense vessel or unexpected calcification. Skull: Intact. Sinuses/Orbits: Mucosal thickening is seen in the right sphenoid sinus. Mild right ethmoid air cell disease also noted. Other: None. CT CERVICAL SPINE FINDINGS Alignment: Trace facet mediated anterolisthesis C3 and C4 noted, unchanged.  Skull base and vertebrae: No acute fracture. No primary bone lesion or focal pathologic process. Soft tissues and spinal canal: No prevertebral fluid or swelling. No visible canal hematoma. Disc levels: Severe multilevel degenerative disc disease identified as seen on prior MRI. Upper chest: Lung apices clear. Other: None. IMPRESSION: No acute abnormality head or cervical spine. 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 Electronically Signed   By: Inge Rise M.D.   On: 12/01/2017 13:56    EKG: Independently reviewed. NSR, non specific st t wave abnormalities.   Assessment/Plan Active Problems:   Hyponatremia   Hyponatremia:  Probably secondary to a combination of hydrochlorothiazide and lexapro, dehydration, severe pain in the neck.  Start her on gentle hydration, stop HCTZ, and lexapro.  Pain control. Evaluate for SIADH if sodium does not improve.     Syncope:  Suspect from hyponatremia, orthostatic hypotension and dehydration.  Initial CT head and neck does not show acute changes. poc troponin neg.  EKG does not show any ischemic changes.  Get EKG in am, monitor on telemetry overnight for arrhythmias, and get echocardiogram for any valvular abnormalities.     Hypothyroidism: Resume synthroid.    Cervical spondylosis:  She has neck collar, and was seen by neuro surgery in ED, recommended decadron.   Mild leukocytosis:  - get UA.  She has been afebrile.    DVT prophylaxis: lovenox.  Code Status: full code.  Family Communication: family at bedside.  Disposition Plan: pending further evaluation.  Consults called: neurosurgery.  Admission status: obs/ tele.    Hosie Poisson MD [facility] Pager [phone]  If 7PM-7AM, please contact night-coverage [email or web address] Password [facility]  12/01/2017, 5:21 PM

## 2017-12-01 NOTE — ED Notes (Signed)
Pt back from CT

## 2017-12-01 NOTE — ED Triage Notes (Signed)
[facility] reports pt had syncopal episode around 1:30 am and has been down on bathroom floor since. Pt has hx of vasovagal response.   Pt reports hitting head on left side and 2/10 neck pain and right arm heaviness. Pt is scheduled for sx next week for cervical issues.  Denies N, V. Husband was home and with her since 3:30 am.  160/100, hr 80, 98% RA, NSR, cbg 121

## 2017-12-01 NOTE — ED Notes (Signed)
Pt given food and beverage per MD

## 2017-12-01 NOTE — ED Notes (Signed)
c-collar removed and beverage provided per Dr. Roderic Palau

## 2017-12-01 NOTE — ED Provider Notes (Addendum)
[facility] EMERGENCY DEPARTMENT Provider Note   CSN: [id number] Arrival date & time: 12/01/17  1139     History   Chief Complaint Chief Complaint  Patient presents with  . Loss of Consciousness    HPI Regina [name] is a 77 y.o. female.  Patient states that she fell last night and was unable to get up in the bathroom.  She was helped up by the paramedics this morning.  Patient has weakness in her arms and is scheduled Thursday to have this surgery on her neck.  Now she is complaining of some additional weakness her legs  The history is provided by the patient. No language interpreter was used.  Loss of Consciousness   This is a new problem. The current episode started 6 to 12 hours ago. The problem occurs rarely. The problem has been resolved. She lost consciousness for a period of less than one minute. The problem is associated with normal activity. Pertinent negatives include abdominal pain, back pain, chest pain, congestion, fever, headaches and seizures. She has tried nothing for the symptoms. The treatment provided no relief.    Past Medical History:  Diagnosis Date  . Anxiety   . Arthritis    neck  . Asthma   . Cervical spondylolysis   . Cervical spondylolysis   . Depression   . Hashimoto's disease   . Insulin resistance   . PONV (postoperative nausea and vomiting)    hasn't had surgery since 1993    There are no active problems to display for this patient.   Past Surgical History:  Procedure Laterality Date  . APPENDECTOMY    . BREAST SURGERY     left biopsy  . CESAREAN SECTION     x2  . CHOLECYSTECTOMY    . DENTAL SURGERY     teeth removal  . OVARY SURGERY     ovarian wedge  . TONSILLECTOMY       OB History   None      Home Medications    Prior to Admission medications   Medication Sig Start Date End Date Taking? Authorizing Provider  acetaminophen (TYLENOL) 500 MG tablet Take 1,000 mg by mouth every 6 (six)  hours as needed (for headaches.).   Yes [provider]  albuterol (PROVENTIL HFA;VENTOLIN HFA) 108 (90 Base) MCG/ACT inhaler Inhale 1-2 puffs into the lungs every 6 (six) hours as needed for wheezing or shortness of breath (asthma/coughing).   Yes [provider]  ALPRAZolam (XANAX) 0.25 MG tablet Take 0.25 mg by mouth 3 (three) times daily.    Yes [provider]  carbamide peroxide (DEBROX) 6.5 % OTIC solution Place 5 drops into both ears daily as needed (wax build up).   Yes [provider]  CHERATUSSIN AC 100-10 MG/5ML syrup Take 5 mLs by mouth 4 (four) times daily. For 5 days. 11/23/17  Yes [provider]  diphenhydrAMINE (BENADRYL) 25 MG tablet Take 25 mg by mouth every 6 (six) hours as needed for allergies.   Yes [provider]  escitalopram (LEXAPRO) 10 MG tablet Take 10 mg by mouth at bedtime.   Yes [provider]  fexofenadine (ALLEGRA) 180 MG tablet Take 180 mg by mouth daily.   Yes [provider]  ibuprofen (ADVIL,MOTRIN) 200 MG tablet Take 600 mg by mouth every 8 (eight) hours as needed (for pain).   Yes [provider]  levothyroxine (SYNTHROID) 75 MCG tablet Take 75 mcg by mouth daily before breakfast.  Yes [provider]  metaxalone (SKELAXIN) 800 MG tablet Take 800 mg by mouth 3 (three) times daily as needed for muscle spasms.   Yes [provider]  metFORMIN (GLUCOPHAGE) 850 MG tablet Take 850 mg by mouth 2 (two) times daily.    Yes [provider]  montelukast (SINGULAIR) 10 MG tablet Take 10 mg by mouth at bedtime. 09/07/17  Yes [provider]  triamcinolone cream (KENALOG) 0.1 % Apply 1 application topically as needed (once a week).   Yes [provider]  triamterene-hydrochlorothiazide (MAXZIDE-25) 37.5-25 MG tablet Take 1 tablet by mouth every morning.  11/19/17  Yes [provider]  APPLE CIDER VINEGAR PO Take 1 capsule by mouth daily with  lunch.    [provider]  aspirin EC 325 MG tablet Take 325 mg by mouth at bedtime.    [provider]    Family History History reviewed. No pertinent family history.  Social History Social History   Tobacco Use  . Smoking status: Former Smoker    Last attempt to quit: 1982    Years since quitting: 37.6  . Smokeless tobacco: Never Used  Substance Use Topics  . Alcohol use: Yes    Comment: very rare  . Drug use: Never     Allergies   Ceclor [cefaclor]; Naproxen; Sulfa antibiotics; Amlodipine; Augmentin [amoxicillin-pot clavulanate]; Other; and Valsartan   Review of Systems Review of Systems  Constitutional: Negative for appetite change, fatigue and fever.  HENT: Negative for congestion, ear discharge and sinus pressure.   Eyes: Negative for discharge.  Respiratory: Negative for cough.   Cardiovascular: Positive for syncope. Negative for chest pain.  Gastrointestinal: Negative for abdominal pain and diarrhea.  Genitourinary: Negative for frequency and hematuria.  Musculoskeletal: Negative for back pain.       Weakness in upper and lower extremities  Skin: Negative for rash.  Neurological: Negative for seizures and headaches.  Psychiatric/Behavioral: Negative for hallucinations.     Physical Exam Updated Vital Signs BP 133/66   Pulse 79   Temp 98.2 F (36.8 C) (Oral)   Resp 18   Ht 5\' 2"  (1.575 m)   Wt 71.7 kg   SpO2 98%   BMI 28.90 kg/m   Physical Exam  Constitutional: She is oriented to person, place, and time. She appears well-developed.  HENT:  Head: Normocephalic.  Eyes: Conjunctivae and EOM are normal. No scleral icterus.  Neck: Neck supple. No thyromegaly present.  Cardiovascular: Normal rate and regular rhythm. Exam reveals no gallop and no friction rub.  No murmur heard. Pulmonary/Chest: No stridor. She has no wheezes. She has no rales. She exhibits no tenderness.  Abdominal: She exhibits no distension. There is no tenderness.  There is no rebound.  Musculoskeletal: Normal range of motion. She exhibits no edema.  Mild weakness in the upper extremities with very minimal weakness in her legs  Lymphadenopathy:    She has no cervical adenopathy.  Neurological: She is oriented to person, place, and time. She exhibits normal muscle tone. Coordination normal.  Skin: No rash noted. No erythema.  Psychiatric: She has a normal mood and affect. Her behavior is normal.     ED Treatments / Results  Labs (all labs ordered are listed, but only abnormal results are displayed) Labs Reviewed  CBC WITH DIFFERENTIAL/PLATELET - Abnormal; Notable for the following components:      Result Value   WBC 11.8 (*)    Neutro Abs 9.9 (*)    Monocytes Absolute  1.1 (*)    All other components within normal limits  COMPREHENSIVE METABOLIC PANEL - Abnormal; Notable for the following components:   Sodium 122 (*)    Chloride 85 (*)    Glucose, Bld 131 (*)    Total Protein 6.2 (*)    All other components within normal limits  I-STAT CHEM 8, ED - Abnormal; Notable for the following components:   Sodium 124 (*)    Chloride 87 (*)    Glucose, Bld 127 (*)    Calcium, Ion 1.10 (*)    All other components within normal limits  CK  I-STAT TROPONIN, ED    EKG None  Radiology Dg Chest 2 View  Result Date: 12/01/2017 CLINICAL DATA:  Syncopal episode earlier this morning at approximately 1:30 a.m., causing a fall in her bathroom. Acute chest pain. EXAM: CHEST - 2 VIEW COMPARISON:  09/14/2015, 07/20/2014. FINDINGS: AP SEMI-ERECT and LATERAL images were obtained. Cardiac silhouette normal in size, unchanged. Thoracic aorta atherosclerotic, unchanged. Hilar and mediastinal contours otherwise unremarkable. Lungs clear. Bronchovascular markings normal. Pulmonary vascularity normal. No visible pleural effusions. No pneumothorax. Degenerative changes involving the thoracic spine. IMPRESSION: No acute cardiopulmonary disease.  Stable examination.  Electronically Signed   By: Evangeline Dakin M.D.   On: 12/01/2017 13:12   Ct Head Wo Contrast  Result Date: 12/01/2017 CLINICAL DATA:  Syncopal episode at 1:30 a.m. last night resulting in a fall and blow to the left side of the head. Initial encounter. EXAM: CT HEAD WITHOUT CONTRAST CT CERVICAL SPINE WITHOUT CONTRAST TECHNIQUE: Multidetector CT imaging of the head and cervical spine was performed following the standard protocol without intravenous contrast. Multiplanar CT image reconstructions of the cervical spine were also generated. COMPARISON:  Cervical spine MRI 11/15/2017. FINDINGS: CT HEAD FINDINGS Brain: No evidence of acute infarction, hemorrhage, hydrocephalus, extra-axial collection or mass lesion/mass effect. Extensive hypoattenuation in the periventricular and subcortical deep white matter consistent chronic microvascular ischemic change is noted. Vascular: No hyperdense vessel or unexpected calcification. Skull: Intact. Sinuses/Orbits: Mucosal thickening is seen in the right sphenoid sinus. Mild right ethmoid air cell disease also noted. Other: None. CT CERVICAL SPINE FINDINGS Alignment: Trace facet mediated anterolisthesis C3 and C4 noted, unchanged. Skull base and vertebrae: No acute fracture. No primary bone lesion or focal pathologic process. Soft tissues and spinal canal: No prevertebral fluid or swelling. No visible canal hematoma. Disc levels: Severe multilevel degenerative disc disease identified as seen on prior MRI. Upper chest: Lung apices clear. Other: None. IMPRESSION: No acute abnormality head or cervical spine. 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 Electronically Signed   By: Inge Rise M.D.   On: 12/01/2017 13:56   Ct Cervical Spine Wo Contrast  Result Date: 12/01/2017 CLINICAL DATA:  Syncopal episode at 1:30 a.m. last night resulting in a fall and blow to the left side of the head. Initial encounter. EXAM: CT  HEAD WITHOUT CONTRAST CT CERVICAL SPINE WITHOUT CONTRAST TECHNIQUE: Multidetector CT imaging of the head and cervical spine was performed following the standard protocol without intravenous contrast. Multiplanar CT image reconstructions of the cervical spine were also generated. COMPARISON:  Cervical spine MRI 11/15/2017. FINDINGS: CT HEAD FINDINGS Brain: No evidence of acute infarction, hemorrhage, hydrocephalus, extra-axial collection or mass lesion/mass effect. Extensive hypoattenuation in the periventricular and subcortical deep white matter consistent chronic microvascular ischemic change is noted. Vascular: No hyperdense vessel or unexpected calcification. Skull: Intact. Sinuses/Orbits: Mucosal thickening is seen in the right sphenoid sinus. Mild right ethmoid  air cell disease also noted. Other: None. CT CERVICAL SPINE FINDINGS Alignment: Trace facet mediated anterolisthesis C3 and C4 noted, unchanged. Skull base and vertebrae: No acute fracture. No primary bone lesion or focal pathologic process. Soft tissues and spinal canal: No prevertebral fluid or swelling. No visible canal hematoma. Disc levels: Severe multilevel degenerative disc disease identified as seen on prior MRI. Upper chest: Lung apices clear. Other: None. IMPRESSION: No acute abnormality head or cervical spine. 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 Electronically Signed   By: Inge Rise M.D.   On: 12/01/2017 13:56    Procedures Procedures (including critical care time)  Medications Ordered in ED Medications  sodium chloride 0.9 % bolus 1,000 mL (1,000 mLs Intravenous New Bag/Given 12/01/17 1513)     Initial Impression / Assessment and Plan / ED Course  I have reviewed the triage vital signs and the nursing notes.  Pertinent labs & imaging results that were available during my care of the patient were reviewed by me and considered in my medical decision making (see  chart for details).     Patient with hyponatremia and cervical disc disease causing weakness in extremities.  She will be admitted by medicine with neurosurgery consult.   Neurosurgery has recommended an Aspen collar and Decadron 4 mg every 6 IV Final Clinical Impressions(s) / ED Diagnoses   Final diagnoses:  Hyponatremia    ED Discharge Orders    None       Milton Ferguson, MD 12/01/17 [place]    Milton Ferguson, MD 12/01/17 [id number]

## 2017-12-01 NOTE — Consult Note (Signed)
Reason for Consult:weakness, fall, LOC Referring Physician: Sonia Bromell Regina [name] is an 77 y.o. female.  HPI:   Patient states that she fell last night and was unable to get up in the bathroom.  She was helped up by the paramedics this morning.  Patient has weakness in her arms and is scheduled Thursday to have this surgery on her neck with Dr. Kathyrn Sheriff.  Now she is complaining of some additional weakness her legs  The history is provided by the patient. No language interpreter was used.  Loss of Consciousness   This is a new problem. The current episode started 6 to 12 hours ago. The problem occurs rarely. The problem has been resolved. She lost consciousness for a period of less than one minute. The problem is associated with normal activity. Pertinent negatives include abdominal pain, back pain, chest pain, congestion, fever, headaches and seizures. She has tried nothing for the symptoms. The treatment provided no relief.   Since the patient has been in the ER, she was given decadron and says that she is getting better in terms of her strength.  She was found to have severe hyponatremia (Na 122) and is being admitted to Medicine to correct this.  Past Medical History:  Diagnosis Date  . Anxiety   . Arthritis    neck  . Asthma   . Cervical spondylolysis   . Cervical spondylolysis   . Depression   . Hashimoto's disease   . Insulin resistance   . PONV (postoperative nausea and vomiting)    hasn't had surgery since 1993    Past Surgical History:  Procedure Laterality Date  . APPENDECTOMY    . BREAST SURGERY     left biopsy  . CESAREAN SECTION     x2  . CHOLECYSTECTOMY    . DENTAL SURGERY     teeth removal  . OVARY SURGERY     ovarian wedge  . TONSILLECTOMY      History reviewed. No pertinent family history.  Social History:  reports that she quit smoking about 37 years ago. She has never used smokeless tobacco. She reports that she drinks alcohol. She  reports that she does not use drugs.  Allergies:  Allergies  Allergen Reactions  . Ceclor [Cefaclor] Rash  . Naproxen Hives and Swelling    Mouth swelling  . Sulfa Antibiotics Nausea And Vomiting and Other (See Comments)    Syncope/headaches   . Amlodipine Cough  . Augmentin [Amoxicillin-Pot Clavulanate] Nausea And Vomiting    Has patient had a PCN reaction causing immediate rash, facial/tongue/throat swelling, SOB or lightheadedness with hypotension: No Has patient had a PCN reaction causing severe rash involving mucus membranes or skin necrosis: No Has patient had a PCN reaction that required hospitalization: No Has patient had a PCN reaction occurring within the last 10 years: No If all of the above answers are "NO", then may proceed with Cephalosporin use.   . Other Nausea And Vomiting    "pain medications" or opoids/Anesthesia  . Valsartan Cough    Medications: I have reviewed the patient's current medications.  Results for orders placed or performed during the hospital encounter of 12/01/17 (from the past 48 hour(s))  CK     Status: None   Collection Time: 12/01/17 12:02 PM  Result Value Ref Range   Total CK 96 38 - 234 U/L    Comment: Performed at [facility], [place] [address], [place], [place] [address]  CBC with Differential/Platelet  Status: Abnormal   Collection Time: 12/01/17 12:02 PM  Result Value Ref Range   WBC 11.8 (H) 4.0 - 10.5 K/uL   RBC 4.51 3.87 - 5.11 MIL/uL   Hemoglobin 12.9 12.0 - 15.0 g/dL   HCT 38.2 36.0 - 46.0 %   MCV 84.7 78.0 - 100.0 fL   MCH 28.6 26.0 - 34.0 pg   MCHC 33.8 30.0 - 36.0 g/dL   RDW 14.3 11.5 - 15.5 %   Platelets 314 150 - 400 K/uL   Neutrophils Relative % 85 %   Neutro Abs 9.9 (H) 1.7 - 7.7 K/uL   Lymphocytes Relative 6 %   Lymphs Abs 0.7 0.7 - 4.0 K/uL   Monocytes Relative 9 %   Monocytes Absolute 1.1 (H) 0.1 - 1.0 K/uL   Eosinophils Relative 0 %   Eosinophils Absolute 0.0 0.0 - 0.7 K/uL   Basophils Relative 0 %    Basophils Absolute 0.0 0.0 - 0.1 K/uL   Immature Granulocytes 0 %   Abs Immature Granulocytes 0.0 0.0 - 0.1 K/uL    Comment: Performed at [facility], [address], [place], [place] [address]  Comprehensive metabolic panel     Status: Abnormal   Collection Time: 12/01/17 12:02 PM  Result Value Ref Range   Sodium 122 (L) 135 - 145 mmol/L   Potassium 3.5 3.5 - 5.1 mmol/L   Chloride 85 (L) 98 - 111 mmol/L   CO2 23 22 - 32 mmol/L   Glucose, Bld 131 (H) 70 - 99 mg/dL   BUN 14 8 - 23 mg/dL   Creatinine, Ser 0.80 0.44 - 1.00 mg/dL   Calcium 9.1 8.9 - 10.3 mg/dL   Total Protein 6.2 (L) 6.5 - 8.1 g/dL   Albumin 3.8 3.5 - 5.0 g/dL   AST 17 15 - 41 U/L   ALT 12 0 - 44 U/L   Alkaline Phosphatase 59 38 - 126 U/L   Total Bilirubin 0.7 0.3 - 1.2 mg/dL   GFR calc non Af Amer >60 >60 mL/min   GFR calc Af Amer >60 >60 mL/min    Comment: (NOTE) The eGFR has been calculated using the CKD EPI equation. This calculation has not been validated in all clinical situations. eGFR's persistently <60 mL/min signify possible Chronic Kidney Disease.    Anion gap 14 5 - 15    Comment: Performed at [facility] [address] [place],  [address]  I-stat troponin, ED     Status: None   Collection Time: 12/01/17 12:07 PM  Result Value Ref Range   Troponin i, poc 0.02 0.00 - 0.08 ng/mL   Comment 3            Comment: Due to the release kinetics of cTnI, a negative result within the first hours of the onset of symptoms does not rule out myocardial infarction with certainty. If myocardial infarction is still suspected, repeat the test at appropriate intervals.   I-stat chem 8, ed     Status: Abnormal   Collection Time: 12/01/17 12:09 PM  Result Value Ref Range   Sodium 124 (L) 135 - 145 mmol/L   Potassium 3.5 3.5 - 5.1 mmol/L   Chloride 87 (L) 98 - 111 mmol/L   BUN 17 8 - 23 mg/dL   Creatinine, Ser 0.70 0.44 - 1.00 mg/dL   Glucose, Bld 127 (H) 70 - 99 mg/dL   Calcium, Ion  1.10 (L) 1.15 - 1.40 mmol/L   TCO2 28  22 - 32 mmol/L   Hemoglobin 13.6 12.0 - 15.0 g/dL   HCT 40.0 36.0 - 46.0 %    Dg Chest 2 View  Result Date: 12/01/2017 CLINICAL DATA:  Syncopal episode earlier this morning at approximately 1:30 a.m., causing a fall in her bathroom. Acute chest pain. EXAM: CHEST - 2 VIEW COMPARISON:  09/14/2015, 07/20/2014. FINDINGS: AP SEMI-ERECT and LATERAL images were obtained. Cardiac silhouette normal in size, unchanged. Thoracic aorta atherosclerotic, unchanged. Hilar and mediastinal contours otherwise unremarkable. Lungs clear. Bronchovascular markings normal. Pulmonary vascularity normal. No visible pleural effusions. No pneumothorax. Degenerative changes involving the thoracic spine. IMPRESSION: No acute cardiopulmonary disease.  Stable examination. Electronically Signed   By: Evangeline Dakin M.D.   On: 12/01/2017 13:12   Ct Head Wo Contrast  Result Date: 12/01/2017 CLINICAL DATA:  Syncopal episode at 1:30 a.m. last night resulting in a fall and blow to the left side of the head. Initial encounter. EXAM: CT HEAD WITHOUT CONTRAST CT CERVICAL SPINE WITHOUT CONTRAST TECHNIQUE: Multidetector CT imaging of the head and cervical spine was performed following the standard protocol without intravenous contrast. Multiplanar CT image reconstructions of the cervical spine were also generated. COMPARISON:  Cervical spine MRI 11/15/2017. FINDINGS: CT HEAD FINDINGS Brain: No evidence of acute infarction, hemorrhage, hydrocephalus, extra-axial collection or mass lesion/mass effect. Extensive hypoattenuation in the periventricular and subcortical deep white matter consistent chronic microvascular ischemic change is noted. Vascular: No hyperdense vessel or unexpected calcification. Skull: Intact. Sinuses/Orbits: Mucosal thickening is seen in the right sphenoid sinus. Mild right ethmoid air cell disease also noted. Other: None. CT CERVICAL SPINE FINDINGS Alignment: Trace facet mediated  anterolisthesis C3 and C4 noted, unchanged. Skull base and vertebrae: No acute fracture. No primary bone lesion or focal pathologic process. Soft tissues and spinal canal: No prevertebral fluid or swelling. No visible canal hematoma. Disc levels: Severe multilevel degenerative disc disease identified as seen on prior MRI. Upper chest: Lung apices clear. Other: None. IMPRESSION: No acute abnormality head or cervical spine. 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 Electronically Signed   By: Inge Rise M.D.   On: 12/01/2017 13:56   Ct Cervical Spine Wo Contrast  Result Date: 12/01/2017 CLINICAL DATA:  Syncopal episode at 1:30 a.m. last night resulting in a fall and blow to the left side of the head. Initial encounter. EXAM: CT HEAD WITHOUT CONTRAST CT CERVICAL SPINE WITHOUT CONTRAST TECHNIQUE: Multidetector CT imaging of the head and cervical spine was performed following the standard protocol without intravenous contrast. Multiplanar CT image reconstructions of the cervical spine were also generated. COMPARISON:  Cervical spine MRI 11/15/2017. FINDINGS: CT HEAD FINDINGS Brain: No evidence of acute infarction, hemorrhage, hydrocephalus, extra-axial collection or mass lesion/mass effect. Extensive hypoattenuation in the periventricular and subcortical deep white matter consistent chronic microvascular ischemic change is noted. Vascular: No hyperdense vessel or unexpected calcification. Skull: Intact. Sinuses/Orbits: Mucosal thickening is seen in the right sphenoid sinus. Mild right ethmoid air cell disease also noted. Other: None. CT CERVICAL SPINE FINDINGS Alignment: Trace facet mediated anterolisthesis C3 and C4 noted, unchanged. Skull base and vertebrae: No acute fracture. No primary bone lesion or focal pathologic process. Soft tissues and spinal canal: No prevertebral fluid or swelling. No visible canal hematoma. Disc levels: Severe multilevel  degenerative disc disease identified as seen on prior MRI. Upper chest: Lung apices clear. Other: None. IMPRESSION: No acute abnormality head or cervical spine. 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 Electronically Signed  By: Inge Rise M.D.   On: 12/01/2017 13:56    Review of Systems - Negative except per HPI    Blood pressure (!) 146/58, pulse 75, temperature 98.2 F (36.8 C), temperature source Oral, resp. rate 16, height _0  (1.575 m), weight 71.7 kg, SpO2 98 %. Physical Exam  Constitutional: She is oriented to person, place, and time. She appears well-developed and well-nourished.  HENT:  Head: Normocephalic and atraumatic.  Eyes: Pupils are equal, round, and reactive to light. EOM are normal.  Neck:  In cervical collar  Neurological: She is oriented to person, place, and time. She displays abnormal reflex. No cranial nerve deficit or sensory deficit. GCS eye subscore is 4. GCS verbal subscore is 5. GCS motor subscore is 6.  Mild bilateral deltoid weakness, otherwise full strength both upper and lower extremities    Assessment/Plan: Since the patient has been in the ER, she was given decadron and says that she is getting better in terms of her strength.  She was found to have severe hyponatremia (Na 122) and is being admitted to Medicine to correct this.  On exam, she does not appear to have significant weakness.  She is wearing a cervical collar.  She will be admitted to Medicine and observed.  Dr. Kathyrn Sheriff will follow patient.  I reviewed patient's head CT and cervical CT.  I do not see any worrisome new findings.  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   Peggyann Shoals, MD 12/01/2017, 5:06 PM

## 2017-12-01 NOTE — ED Notes (Signed)
Pt reports that she was lying on right side with right arm tucked underneath her body, which may be related to the heaviness on the right side.

## 2017-12-01 NOTE — ED Notes (Signed)
HH dinner tray ordered

## 2017-12-01 NOTE — ED Notes (Signed)
neurosurgeon at bedside

## 2017-12-02 ENCOUNTER — Observation Stay (HOSPITAL_BASED_OUTPATIENT_CLINIC_OR_DEPARTMENT_OTHER): Payer: Medicare Other

## 2017-12-02 ENCOUNTER — Encounter (HOSPITAL_COMMUNITY): Payer: Self-pay | Admitting: Internal Medicine

## 2017-12-02 ENCOUNTER — Other Ambulatory Visit: Payer: Self-pay

## 2017-12-02 DIAGNOSIS — I340 Nonrheumatic mitral (valve) insufficiency: Secondary | ICD-10-CM

## 2017-12-02 DIAGNOSIS — M4302 Spondylolysis, cervical region: Secondary | ICD-10-CM

## 2017-12-02 DIAGNOSIS — E871 Hypo-osmolality and hyponatremia: Secondary | ICD-10-CM | POA: Diagnosis not present

## 2017-12-02 DIAGNOSIS — E039 Hypothyroidism, unspecified: Secondary | ICD-10-CM | POA: Diagnosis not present

## 2017-12-02 DIAGNOSIS — E063 Autoimmune thyroiditis: Secondary | ICD-10-CM | POA: Diagnosis present

## 2017-12-02 DIAGNOSIS — F419 Anxiety disorder, unspecified: Secondary | ICD-10-CM | POA: Diagnosis present

## 2017-12-02 DIAGNOSIS — E88819 Insulin resistance, unspecified: Secondary | ICD-10-CM | POA: Diagnosis present

## 2017-12-02 DIAGNOSIS — E8881 Metabolic syndrome: Secondary | ICD-10-CM | POA: Diagnosis present

## 2017-12-02 LAB — GLUCOSE, CAPILLARY
Glucose-Capillary: 123 mg/dL — ABNORMAL HIGH (ref 70–99)
Glucose-Capillary: 141 mg/dL — ABNORMAL HIGH (ref 70–99)

## 2017-12-02 LAB — BASIC METABOLIC PANEL
Anion gap: 10 (ref 5–15)
BUN: 9 mg/dL (ref 8–23)
CO2: 24 mmol/L (ref 22–32)
Calcium: 8.8 mg/dL — ABNORMAL LOW (ref 8.9–10.3)
Chloride: 94 mmol/L — ABNORMAL LOW (ref 98–111)
Creatinine, Ser: 0.78 mg/dL (ref 0.44–1.00)
GFR calc Af Amer: >60 mL/min (ref >60)
GFR calc non Af Amer: >60 mL/min (ref >60)
Glucose, Bld: 132 mg/dL — ABNORMAL HIGH (ref 70–99)
Potassium: 3.5 mmol/L (ref 3.5–5.1)
Sodium: 128 mmol/L — ABNORMAL LOW (ref 135–145)

## 2017-12-02 LAB — URINALYSIS, ROUTINE W REFLEX MICROSCOPIC
Bilirubin Urine: NEGATIVE
Glucose, UA: NEGATIVE mg/dL
Hgb urine dipstick: NEGATIVE
Ketones, ur: NEGATIVE mg/dL
Leukocytes, UA: NEGATIVE
Nitrite: NEGATIVE
Protein, ur: NEGATIVE mg/dL
Specific Gravity, Urine: 1.008 (ref 1.005–1.030)
pH: 6 (ref 5.0–8.0)

## 2017-12-02 LAB — OSMOLALITY: Osmolality: 269 mOsm/kg — ABNORMAL LOW (ref 275–295)

## 2017-12-02 LAB — ECHOCARDIOGRAM COMPLETE
Height: 62 in
Weight: 2567.92 oz

## 2017-12-02 LAB — SODIUM, URINE, RANDOM: Sodium, Ur: 44 mmol/L

## 2017-12-02 LAB — OSMOLALITY, URINE: Osmolality, Ur: 298 mOsm/kg — ABNORMAL LOW (ref 300–900)

## 2017-12-02 MED ORDER — ALPRAZOLAM 0.25 MG PO TABS
0.2500 mg | ORAL_TABLET | Freq: Two times a day (BID) | ORAL | Status: DC | PRN
  Administered 2017-12-02 – 2017-12-03 (×2): 0.25 mg via ORAL
  Filled 2017-12-02 (×2): qty 1

## 2017-12-02 NOTE — Progress Notes (Signed)
[facility] PROGRESS NOTE  Regina [name] TIW:[id number] DOB: [birth year] DOA: 12/01/2017 PCP: Maurice Small, MD  Assessment/Plan:  #1.Hyponatremia - likely related to  hydrochlorothiazide and lexapro, dehydration, severe pain in the neck.  -sodium 128 this am from 122 yesterday -holding HCTZ and lexapro -continue IV fluids -serum osmolality and urine sodium and osmolality -recheck in am. Pt reports baseline is low end of normal. Likely discharge tomorrow   #2.Syncope:  -Suspect from hyponatremia, orthostatic hypotension and dehydration. Mild leukocytosis otherwise no s/sx infection. Initial CT head and neck does not show acute changes. poc troponin neg. EKG does not show any ischemic changes. No events on tele -follow echo -awaiting orthostatic VS  -obtain urinalysis -see #1  #3.Hypothyroidism: -tsh 0.895 -continue synthroid.  #4. Cervical spondylosis:  -She has neck collar, and was seen by neuro surgery in ED, recommended decadron.  -scheduled for surgery 4 days from now -reports improvement with decadron -physical therapy as patient complains of unsteady gait  #5. Mild leukocytosis:  - get UA.    Code Status: full Family Communication: husband and daughter at bedside Disposition Plan: home hopefully tomorrow   Consultants:  Costella PA for neurosurgery  Procedures:  none  Antibiotics:  none  HPI/Subjective: Regina [name] is a 77 y.o. female with medical history significant of cervical spondylosis, was seen by neuro surgery last week and was tentatively on schedule for surgery on Thursday, presented for an episode of syncope, . She reported she got up to go to the bathroom and felt dizzy and passed out. She denied any chest pain or sob, palpitations, urinary symptoms, fever , chills, nausea, vomiting, or diarrhea. She reported headache, generalized weakness, stiff neck, and weakness of the upper extremities  Objective: Vitals:    12/01/17 2140 12/02/17 0441  BP: (!) 144/62 (!) 168/62  Pulse: 76 71  Resp: 16 18  Temp: 99.8 F (37.7 C) 98.5 F (36.9 C)  SpO2: 96% 94%    Intake/Output Summary (Last 24 hours) at 12/02/2017 1212 Last data filed at 12/02/2017 0538 Gross per 24 hour  Intake 1750 ml  Output 1 ml  Net 1749 ml   Filed Weights   12/01/17 1131 12/01/17 1133 12/01/17 1909  Weight: 71.7 kg 71.7 kg 72.8 kg    Exam:   General:  Sitting up in bed in no acute distress  Cardiovascular: rrr no mgr no le edema PPP  Respiratory: normal effort bs clear bilaterally no wheeze  Abdomen: obese soft +BS non tender to palpation no guarding or rebounding  Musculoskeletal: joints without swelling/eerythema    Data Reviewed: Basic Metabolic Panel: Recent Labs  Lab 11/29/17 1337 12/01/17 1202 12/01/17 1209 12/02/17 0514  NA 128* 122* 124* 128*  K 3.7 3.5 3.5 3.5  CL 88* 85* 87* 94*  CO2 27 23  --  24  GLUCOSE 95 131* 127* 132*  BUN 13 14 17 9   CREATININE 0.86 0.80 0.70 0.78  CALCIUM 9.5 9.1  --  8.8*   Liver Function Tests: Recent Labs  Lab 12/01/17 1202  AST 17  ALT 12  ALKPHOS 59  BILITOT 0.7  PROT 6.2*  ALBUMIN 3.8   No results for input(s): LIPASE, AMYLASE in the last 168 hours. No results for input(s): AMMONIA in the last 168 hours. CBC: Recent Labs  Lab 11/29/17 1337 12/01/17 1202 12/01/17 1209  WBC 7.7 11.8*  --   NEUTROABS  --  9.9*  --   HGB 12.8 12.9 13.6  HCT 38.6 38.2 40.0  MCV 86.4 84.7  --   PLT 344 314  --    Cardiac Enzymes: Recent Labs  Lab 12/01/17 1202  CKTOTAL 96   BNP (last 3 results) No results for input(s): BNP in the last 8760 hours.  ProBNP (last 3 results) No results for input(s): PROBNP in the last 8760 hours.  CBG: Recent Labs  Lab 12/02/17 0909  GLUCAP 123*    Recent Results (from the past 240 hour(s))  Surgical pcr screen     Status: None   Collection Time: 11/29/17  1:37 PM  Result Value Ref Range Status   MRSA, PCR NEGATIVE  NEGATIVE Final   Staphylococcus aureus NEGATIVE NEGATIVE Final    Comment: (NOTE) The Xpert SA Assay (FDA approved for NASAL specimens in patients 18 years of age and older), is one component of a comprehensive surveillance program. It is not intended to diagnose infection nor to guide or monitor treatment. Performed at [facility], [place] [address], [place], [place] [address]      Studies: Dg Chest 2 View  Result Date: 12/01/2017 CLINICAL DATA:  Syncopal episode earlier this morning at approximately 1:30 a.m., causing a fall in her bathroom. Acute chest pain. EXAM: CHEST - 2 VIEW COMPARISON:  09/14/2015, 07/20/2014. FINDINGS: AP SEMI-ERECT and LATERAL images were obtained. Cardiac silhouette normal in size, unchanged. Thoracic aorta atherosclerotic, unchanged. Hilar and mediastinal contours otherwise unremarkable. Lungs clear. Bronchovascular markings normal. Pulmonary vascularity normal. No visible pleural effusions. No pneumothorax. Degenerative changes involving the thoracic spine. IMPRESSION: No acute cardiopulmonary disease.  Stable examination. Electronically Signed   By: Evangeline Dakin M.D.   On: 12/01/2017 13:12   Ct Head Wo Contrast  Result Date: 12/01/2017 CLINICAL DATA:  Syncopal episode at 1:30 a.m. last night resulting in a fall and blow to the left side of the head. Initial encounter. EXAM: CT HEAD WITHOUT CONTRAST CT CERVICAL SPINE WITHOUT CONTRAST TECHNIQUE: Multidetector CT imaging of the head and cervical spine was performed following the standard protocol without intravenous contrast. Multiplanar CT image reconstructions of the cervical spine were also generated. COMPARISON:  Cervical spine MRI 11/15/2017. FINDINGS: CT HEAD FINDINGS Brain: No evidence of acute infarction, hemorrhage, hydrocephalus, extra-axial collection or mass lesion/mass effect. Extensive hypoattenuation in the periventricular and subcortical deep white matter consistent chronic microvascular ischemic  change is noted. Vascular: No hyperdense vessel or unexpected calcification. Skull: Intact. Sinuses/Orbits: Mucosal thickening is seen in the right sphenoid sinus. Mild right ethmoid air cell disease also noted. Other: None. CT CERVICAL SPINE FINDINGS Alignment: Trace facet mediated anterolisthesis C3 and C4 noted, unchanged. Skull base and vertebrae: No acute fracture. No primary bone lesion or focal pathologic process. Soft tissues and spinal canal: No prevertebral fluid or swelling. No visible canal hematoma. Disc levels: Severe multilevel degenerative disc disease identified as seen on prior MRI. Upper chest: Lung apices clear. Other: None. IMPRESSION: No acute abnormality head or cervical spine. 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 Electronically Signed   By: Inge Rise M.D.   On: 12/01/2017 13:56   Ct Cervical Spine Wo Contrast  Result Date: 12/01/2017 CLINICAL DATA:  Syncopal episode at 1:30 a.m. last night resulting in a fall and blow to the left side of the head. Initial encounter. EXAM: CT HEAD WITHOUT CONTRAST CT CERVICAL SPINE WITHOUT CONTRAST TECHNIQUE: Multidetector CT imaging of the head and cervical spine was performed following the standard protocol without intravenous contrast. Multiplanar CT image reconstructions of the cervical spine were also generated.  COMPARISON:  Cervical spine MRI 11/15/2017. FINDINGS: CT HEAD FINDINGS Brain: No evidence of acute infarction, hemorrhage, hydrocephalus, extra-axial collection or mass lesion/mass effect. Extensive hypoattenuation in the periventricular and subcortical deep white matter consistent chronic microvascular ischemic change is noted. Vascular: No hyperdense vessel or unexpected calcification. Skull: Intact. Sinuses/Orbits: Mucosal thickening is seen in the right sphenoid sinus. Mild right ethmoid air cell disease also noted. Other: None. CT CERVICAL SPINE FINDINGS Alignment: Trace  facet mediated anterolisthesis C3 and C4 noted, unchanged. Skull base and vertebrae: No acute fracture. No primary bone lesion or focal pathologic process. Soft tissues and spinal canal: No prevertebral fluid or swelling. No visible canal hematoma. Disc levels: Severe multilevel degenerative disc disease identified as seen on prior MRI. Upper chest: Lung apices clear. Other: None. IMPRESSION: No acute abnormality head or cervical spine. 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 Electronically Signed   By: Inge Rise M.D.   On: 12/01/2017 13:56    Scheduled Meds: . ALPRAZolam  0.25 mg Oral TID  . dexamethasone  4 mg Intravenous Q6H  . enoxaparin (LOVENOX) injection  40 mg Subcutaneous Q24H  . levothyroxine  75 mcg Oral QAC breakfast  . loratadine  10 mg Oral Daily  . montelukast  10 mg Oral QHS   Continuous Infusions: . sodium chloride 75 mL/hr at 12/01/17 1628    Principal Problem:   Hyponatremia Active Problems:   Hypothyroidism   Insulin resistance   Hashimoto's disease   Cervical spondylolysis   Anxiety    Time spent: 65 minutes    Evan. If 7PM-7AM, please contact night-coverage at [email or web address], password [facility] 12/02/2017, 12:12 PM  LOS: 0 days

## 2017-12-02 NOTE — Progress Notes (Addendum)
Neurosurgery Progress Note  Reviewed admission history. Patient tells me she was using restroom and woke up on bathroom floor with RUE under her. Work up significant for hyponatremia. Currently admitted under [facility] for management.  Initially she was having BUE "heaviness" R>L after the syncopal episode. Denies focal deficit, but just felt as though she couldn't move her UE as well, particular in shoulders. This has since resolved this am. She reports essentially being at baseline strength wise.  Scheduled for ACDF due to known cervical spinal stenosis on Thursday with Dr Kathyrn Sheriff.   EXAM:  BP (!) 168/62 (BP Location: Right Arm)   Pulse 71   Temp 98.5 F (36.9 C) (Oral)   Resp 18   Ht 5\' 2"  (1.575 m)   Wt 72.8 kg   SpO2 94%   BMI 29.35 kg/m   Awake, alert, oriented  Speech fluent, appropriate  CN grossly intact  MAEW with 5/5 strength  PLAN Doing well this am from NS standpoint. She is at baseline strength wise.  She is scheduled for ACDF C3-6 on Thursday due to known cervical spinal stenosis. Will await medical clearance to proceed with surgery. If deemed medically stable & she can be d/c home, she can return Thursday as originally planned for surgery, or can remain in hospital until surgery. Will follow from [facility].

## 2017-12-02 NOTE — Plan of Care (Signed)

## 2017-12-02 NOTE — Progress Notes (Signed)
  Echocardiogram 2D Echocardiogram has been performed.  Regina [name] Regina [name] 12/02/2017, 8:49 AM

## 2017-12-03 DIAGNOSIS — E039 Hypothyroidism, unspecified: Secondary | ICD-10-CM | POA: Diagnosis not present

## 2017-12-03 DIAGNOSIS — M4302 Spondylolysis, cervical region: Secondary | ICD-10-CM | POA: Diagnosis not present

## 2017-12-03 DIAGNOSIS — E871 Hypo-osmolality and hyponatremia: Secondary | ICD-10-CM | POA: Diagnosis not present

## 2017-12-03 LAB — BASIC METABOLIC PANEL
Anion gap: 8 (ref 5–15)
BUN: 9 mg/dL (ref 8–23)
CO2: 27 mmol/L (ref 22–32)
Calcium: 9.1 mg/dL (ref 8.9–10.3)
Chloride: 99 mmol/L (ref 98–111)
Creatinine, Ser: 0.72 mg/dL (ref 0.44–1.00)
GFR calc Af Amer: >60 mL/min (ref >60)
GFR calc non Af Amer: >60 mL/min (ref >60)
Glucose, Bld: 137 mg/dL — ABNORMAL HIGH (ref 70–99)
Potassium: 3.9 mmol/L (ref 3.5–5.1)
Sodium: 134 mmol/L — ABNORMAL LOW (ref 135–145)

## 2017-12-03 MED ORDER — ESCITALOPRAM OXALATE 10 MG PO TABS
10.0000 mg | ORAL_TABLET | Freq: Every day | ORAL | Status: DC
  Administered 2017-12-03 – 2017-12-09 (×7): 10 mg via ORAL
  Filled 2017-12-03 (×7): qty 1

## 2017-12-03 MED ORDER — DEXAMETHASONE SODIUM PHOSPHATE 10 MG/ML IJ SOLN
4.0000 mg | Freq: Two times a day (BID) | INTRAMUSCULAR | Status: DC
  Administered 2017-12-03 – 2017-12-04 (×2): 4 mg via INTRAVENOUS
  Filled 2017-12-03 (×2): qty 1

## 2017-12-03 MED ORDER — HYDRALAZINE HCL 10 MG PO TABS
10.0000 mg | ORAL_TABLET | Freq: Three times a day (TID) | ORAL | Status: DC
  Administered 2017-12-03 – 2017-12-05 (×6): 10 mg via ORAL
  Filled 2017-12-03 (×6): qty 1

## 2017-12-03 MED ORDER — FLUTICASONE PROPIONATE 50 MCG/ACT NA SUSP
1.0000 | Freq: Every day | NASAL | Status: DC
  Administered 2017-12-03 – 2017-12-10 (×7): 1 via NASAL
  Filled 2017-12-03 (×2): qty 16

## 2017-12-03 MED ORDER — ALPRAZOLAM 0.25 MG PO TABS
0.2500 mg | ORAL_TABLET | Freq: Three times a day (TID) | ORAL | Status: DC | PRN
  Administered 2017-12-03 – 2017-12-10 (×13): 0.25 mg via ORAL
  Filled 2017-12-03 (×13): qty 1

## 2017-12-03 NOTE — Evaluation (Signed)
Physical Therapy Evaluation Patient Details Name: Regina [name] MRN: [id number] DOB: 03-25-1941 Today's Date: 12/03/2017   History of Present Illness  Pt is a 77 y/o female admitted secondary to possible syncope. Pt found to have hyponatremia as well. 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 PMH includes hashimotos disease, asthma, and cervical spondylosis.   Clinical Impression  Pt admitted secondary to problem above with deficits below. Pt presenting with weakness and numbness in bilateral UEs and weakness in LEs, poor balance, and dizziness. BP fluctuating when taking orthostatics (see vitals flowsheet) and pt with increased dizziness with prolonged standing. Required mod A for bed mobility and to stand at EOB. Pt with increased sway when standing at EOB. Feel pt is at increased risk for falls and pt very concerned about falling at home as she has a cervical surgery scheduled for later in the week. Feel pt would be excellent candidate for CIR as she was very independent prior to admission and was still working/travelling. Will continue to follow acutely to maximize functional mobility independence and safety.     Follow Up Recommendations CIR;Supervision/Assistance - 24 hour    Equipment Recommendations  Rolling walker with 5" wheels    Recommendations for Other Services OT consult     Precautions / Restrictions Precautions Precautions: Fall Restrictions Weight Bearing Restrictions: No      Mobility  Bed Mobility Overal bed mobility: Needs Assistance Bed Mobility: Supine to Sit;Sit to Supine     Supine to sit: Mod assist Sit to supine: Min assist   General bed mobility comments: Mod A for trunk elevation and assist to scoot hips to EOB. Increased time required. Min A for LE assist to return to supine.   Transfers Overall transfer level: Needs assistance Equipment used: Rolling walker (2 wheeled) Transfers: Sit  to/from Stand Sit to Stand: Mod assist         General transfer comment: Mod A for lift assist and steadying. Pt with increased sway during standing and increased dizziness. Was able to tolerate orthostatics, but further mobility deferred secondary to worsening dizziness. See vitals flowsheets for orthostatics.   Ambulation/Gait             General Gait Details: Deferred   Stairs            Wheelchair Mobility    Modified Rankin (Stroke Patients Only)       Balance Overall balance assessment: Needs assistance Sitting-balance support: No upper extremity supported;Feet supported Sitting balance-Leahy Scale: Fair     Standing balance support: Bilateral upper extremity supported;During functional activity Standing balance-Leahy Scale: Poor Standing balance comment: Reliant on BUE support                              Pertinent Vitals/Pain Pain Assessment: No/denies pain    Home Living Family/patient expects to be discharged to:: Private residence Living Arrangements: Spouse/significant other;Children Available Help at Discharge: Family;Available 24 hours/day Type of Home: House Home Access: Stairs to enter Entrance Stairs-Rails: None Entrance Stairs-Number of Steps: 1(threshold step ) Home Layout: One level Home Equipment: None Additional Comments: Wife is caregiver for husband who has had stroke. Mostly cognitive side and no physical assist.     Prior Function Level of Independence: Independent               Hand Dominance   Dominant Hand: Right    Extremity/Trunk Assessment  Upper Extremity Assessment Upper Extremity Assessment: Defer to OT evaluation;Generalized weakness(Reports numbness in bilat hands and weakness in BUE )    Lower Extremity Assessment Lower Extremity Assessment: Generalized weakness    Cervical / Trunk Assessment Cervical / Trunk Assessment: Other exceptions Cervical / Trunk Exceptions: cervical  spondylosis   Communication   Communication: No difficulties  Cognition Arousal/Alertness: Awake/alert Behavior During Therapy: WFL for tasks assessed/performed Overall Cognitive Status: Within Functional Limits for tasks assessed                                        General Comments General comments (skin integrity, edema, etc.): [name] present throughout session. Very worried about returning home prior to surgery.     Exercises     Assessment/Plan    PT Assessment Patient needs continued PT services  PT Problem List Decreased strength;Decreased balance;Decreased mobility;Decreased knowledge of use of DME;Decreased knowledge of precautions       PT Treatment Interventions DME instruction;Gait training;Functional mobility training;Therapeutic activities;Therapeutic exercise;Balance training;Patient/family education    PT Goals (Current goals can be found in the Care Plan section)  Acute Rehab PT Goals Patient Stated Goal: "I don't want to fall again before my surgery"  PT Goal Formulation: With patient Time For Goal Achievement: 12/17/17 Potential to Achieve Goals: Good    Frequency Min 3X/week   Barriers to discharge Decreased caregiver support Husband unable to physically assist.     Co-evaluation               AM-PAC PT "6 Clicks" Daily Activity  Outcome Measure Difficulty turning over in bed (including adjusting bedclothes, sheets and blankets)?: A Little Difficulty moving from lying on back to sitting on the side of the bed? : Unable Difficulty sitting down on and standing up from a chair with arms (e.g., wheelchair, bedside commode, etc,.)?: Unable Help needed moving to and from a bed to chair (including a wheelchair)?: A Lot Help needed walking in hospital room?: A Lot Help needed climbing 3-5 steps with a railing? : Total 6 Click Score: 10    End of Session Equipment Utilized During Treatment: Gait belt Activity Tolerance:  Treatment limited secondary to medical complications (Comment)(dizziness ) Patient left: in bed;with bed alarm set;with call bell/phone within reach Nurse Communication: Mobility status;Other (comment)(orthostatics ) PT Visit Diagnosis: Unsteadiness on feet (R26.81);Muscle weakness (generalized) (M62.81);History of falling (Z91.81)    Time: 3664-4034 PT Time Calculation (min) (ACUTE ONLY): 35 min   Charges:   PT Evaluation $PT Eval Moderate Complexity: 1 Mod PT Treatments $Therapeutic Activity: 8-22 mins        Leighton Ruff, PT, DPT  Acute Rehabilitation Services  Pager: [phone]   Rudean Hitt 12/03/2017, 3:37 PM

## 2017-12-03 NOTE — Progress Notes (Signed)
Rehab Admissions Coordinator Note:  Per PT recommendation, Patient was screened by Jhonnie Garner for appropriateness for an Inpatient Acute Rehab Consult. Noted pt has scheduled surgery on 9/5. Due to pending surgery, we will hold on consult request until completion of surgery. If, after surgery, pt continues to demonsrates functional deficits, AC will ask for consult order. If pending surgery is cancelled and pt remains in house with deficits, please contact AC for immediate screen.   Jhonnie Garner 12/03/2017, 5:39 PM  I can be reached at [phone].

## 2017-12-03 NOTE — Evaluation (Signed)
Occupational Therapy Evaluation Patient Details Name: Regina [name] MRN: [id number] DOB: 1941/01/19 Today's Date: 12/03/2017    History of Present Illness Pt is a 77 y/o female admitted secondary to possible syncope. Pt found to have hyponatremia as well. 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 PMH includes hashimotos disease, asthma, and cervical spondylosis.    Clinical Impression   This 77 yo female admitted with above presents with decreased use of Bil UEs, decreased bed mobility, decreased standing balance and mobility all affecting her PLOF of being Independent to Mod I with all basic ADLs and IADLs. She will benefit from acute OT with follow up OT on CIR.    Follow Up Recommendations  CIR;Supervision/Assistance - 24 hour    Equipment Recommendations  Other (comment)(TBD at next venue)       Precautions / Restrictions Precautions Precautions: Fall Restrictions Weight Bearing Restrictions: No      Mobility Bed Mobility Overal bed mobility: Needs Assistance Bed Mobility: Rolling;Sidelying to Sit;Sit to Sidelying Rolling: Min assist(VCs for sequencing) Sidelying to sit: Mod assist(HOB flat and use of rail, A for legs and up with trunk) Supine to sit: Mod assist Sit to supine: Min assist Sit to sidelying: Mod assist(A for legs)  Transfers Overall transfer level: Needs assistance Equipment used: Rolling walker (2 wheeled) Transfers: Sit to/from Stand Sit to Stand: Mod assist            Balance Overall balance assessment: Needs assistance Sitting-balance support: No upper extremity supported;Feet supported Sitting balance-Leahy Scale: Fair     Standing balance support: Bilateral upper extremity supported Standing balance-Leahy Scale: Poor Standing balance comment: Reliant on BUE support                            ADL either performed or assessed with clinical judgement   ADL Overall  ADL's : Needs assistance/impaired Eating/Feeding: Set up;Supervision/ safety;With adaptive utensils;Bed level Eating/Feeding Details (indicate cue type and reason): red tubing for fork and spoon Grooming: Bed level;With adaptive equipment;Moderate assistance Grooming Details (indicate cue type and reason): red tubing for toothbrush Upper Body Bathing: Moderate assistance;Bed level   Lower Body Bathing: Total assistance;Bed level   Upper Body Dressing : Moderate assistance;Sitting Upper Body Dressing Details (indicate cue type and reason): putting on gown as a robe Lower Body Dressing: Total assistance Lower Body Dressing Details (indicate cue type and reason): Mod A sit<>stand Toilet Transfer: Moderate assistance Toilet Transfer Details (indicate cue type and reason): side step 3 steps up towards [place] with Bil HHA Toileting- Clothing Manipulation and Hygiene: Total assistance Toileting - Clothing Manipulation Details (indicate cue type and reason): Mod A sit<>stand       General ADL Comments: Issued pt 3 pieces of red tubing     Vision Patient Visual Report: No change from baseline              Pertinent Vitals/Pain Pain Assessment: No/denies pain     Hand Dominance Right   Extremity/Trunk Assessment Upper Extremity Assessment Upper Extremity Assessment: RUE deficits/detail;LUE deficits/detail RUE Deficits / Details: Supine grossly 2-/5 shoulder flexion; 3/5 elbow flexion/extension with increased effort and time, grip 3/5 (does report dropping items) RUE Sensation: decreased light touch(reports it feels like her fingers are swollen but they are not) RUE Coordination: decreased fine motor;decreased gross motor LUE Deficits / Details: Supine grossly 2-/5 shoulder flexion; 3/5 elbow flexion/extension with increased effort and time, grip 3/5 (  does report dropping items) LUE Sensation: decreased light touch(reports it feels like her fingers are swollen but they are not) LUE  Coordination: decreased fine motor     Communication Communication Communication: No difficulties   Cognition Arousal/Alertness: Awake/alert Behavior During Therapy: WFL for tasks assessed/performed Overall Cognitive Status: Within Functional Limits for tasks assessed                                     General Comments  [name] present throughout session. Very worried about returning home prior to surgery.             Home Living Family/patient expects to be discharged to:: Inpatient rehab Living Arrangements: Spouse/significant other;Children Available Help at Discharge: Family;Available 24 hours/day Type of Home: House Home Access: Stairs to enter [employer] of Steps: threshold Entrance Stairs-Rails: None Home Layout: One level     Bathroom Shower/Tub: Occupational psychologist: Standard     Home Equipment: None   Additional Comments: Wife is caregiver for husband who has had stroke. Mostly cognitive side and no physical assist.       Prior Functioning/Environment Level of Independence: Independent                 OT Problem List: Decreased strength;Decreased range of motion;Impaired balance (sitting and/or standing);Impaired sensation;Impaired UE functional use      OT Treatment/Interventions: Self-care/ADL training;Balance training;DME and/or AE instruction;Patient/family education;Therapeutic activities;Therapeutic exercise    OT Goals(Current goals can be found in the care plan section) Acute Rehab OT Goals Patient Stated Goal: To be able to take care of myself again OT Goal Formulation: With patient Time For Goal Achievement: 12/17/17 Potential to Achieve Goals: Good  OT Frequency: Min 3X/week              AM-PAC PT "6 Clicks" Daily Activity     Outcome Measure Help from another person eating meals?: A Little Help from another person taking care of personal grooming?: A Lot Help from another person  toileting, which includes using toliet, bedpan, or urinal?: Total Help from another person bathing (including washing, rinsing, drying)?: A Lot Help from another person to put on and taking off regular upper body clothing?: A Lot Help from another person to put on and taking off regular lower body clothing?: Total 6 Click Score: 11   End of Session    Activity Tolerance: Patient tolerated treatment well Patient left: in bed;with call bell/phone within reach;with bed alarm set  OT Visit Diagnosis: Other abnormalities of gait and mobility (R26.89);Unsteadiness on feet (R26.81);History of falling (Z91.81);Muscle weakness (generalized) (M62.81)                Time: 8032-1224 OT Time Calculation (min): 30 min Charges:  OT General Charges $OT Visit: 1 Visit OT Evaluation $OT Eval Moderate Complexity: 1 Mod OT Treatments $Self Care/Home Management : 8-22 mins  [address], OTR/L Acute [employer] Pager [phone] Office [phone]

## 2017-12-03 NOTE — Progress Notes (Signed)
[facility] PROGRESS NOTE  Urvi Imes Almquist ZOX:[id number] DOB: 10-15-1940 DOA: 12/01/2017 PCP: Maurice Small, MD  Carolanne Mercier Regina [name] is a 77 y.o. female with medical history significant of cervical spondylosis, was seen by neuro surgery last week and was tentatively on schedule for surgery on Thursday, comes in for an episode of syncope, . She reports she got up to go to the bathroom last night and felt dizzy and passed out.   Assessment/Plan: Hyponatremia - likely related to  hydrochlorothiazide and lexapro, dehydration, severe pain in the neck.  -sodium slowly improving with IVF -holding HCTZ -continue IV fluids  Syncope:  -Suspect from hyponatremia, orthostatic hypotension and dehydration.  -possible pain as well -echo: - Left ventricle: The cavity size was normal. Wall thickness was   normal. Systolic function was normal. The estimated ejection   fraction was in the range of 60% to 65%. Wall motion was normal;   there were no regional wall motion abnormalities. Left   ventricular diastolic function parameters were normal.  Hypothyroidism: -tsh 0.895 -continue synthroid.   Cervical spondylosis:  -surgery scheduled for Thursday -PT eval-- suspect patient is not stable enough to return home prior to surgery -decadron (appears to be started in the ER but not recommended by neurosurgery-- not mentioned in their note so will wean off)   Code Status: full Family Communication: none Disposition Plan: pending PT Eval and safe d/c plan prior to Surgery on Thursday   Consultants:  neurosurgery   HPI/Subjective: Was not stable getting from bed to chair C/o cough due to allergies  Objective: Vitals:   12/02/17 2147 12/03/17 0534  BP: (!) 167/62 (!) 152/65  Pulse: 64 80  Resp:    Temp: 99.2 F (37.3 C) 98.6 F (37 C)  SpO2: 99% 95%    Intake/Output Summary (Last 24 hours) at 12/03/2017 1342 Last data filed at 12/03/2017 1100 Gross per 24 hour   Intake 1182.5 ml  Output 1300 ml  Net -117.5 ml   Filed Weights   12/01/17 1131 12/01/17 1133 12/01/17 1909  Weight: 71.7 kg 71.7 kg 72.8 kg    Exam:  In chair, eating breakfast No increased work of breathing Neck brace removed No LE edema rrr (tele shows: PVCs)  Data Reviewed: Basic Metabolic Panel: Recent Labs  Lab 11/29/17 1337 12/01/17 1202 12/01/17 1209 12/02/17 0514 12/03/17 0424  NA 128* 122* 124* 128* 134*  K 3.7 3.5 3.5 3.5 3.9  CL 88* 85* 87* 94* 99  CO2 27 23  --  24 27  GLUCOSE 95 131* 127* 132* 137*  BUN 13 14 17 9 9   CREATININE 0.86 0.80 0.70 0.78 0.72  CALCIUM 9.5 9.1  --  8.8* 9.1   Liver Function Tests: Recent Labs  Lab 12/01/17 1202  AST 17  ALT 12  ALKPHOS 59  BILITOT 0.7  PROT 6.2*  ALBUMIN 3.8   No results for input(s): LIPASE, AMYLASE in the last 168 hours. No results for input(s): AMMONIA in the last 168 hours. CBC: Recent Labs  Lab 11/29/17 1337 12/01/17 1202 12/01/17 1209  WBC 7.7 11.8*  --   NEUTROABS  --  9.9*  --   HGB 12.8 12.9 13.6  HCT 38.6 38.2 40.0  MCV 86.4 84.7  --   PLT 344 314  --    Cardiac Enzymes: Recent Labs  Lab 12/01/17 1202  CKTOTAL 96   BNP (last 3 results) No results for input(s): BNP in the last 8760 hours.  ProBNP (last 3 results)  No results for input(s): PROBNP in the last 8760 hours.  CBG: Recent Labs  Lab 12/02/17 0909 12/02/17 1212  GLUCAP 123* 141*    Recent Results (from the past 240 hour(s))  Surgical pcr screen     Status: None   Collection Time: 11/29/17  1:37 PM  Result Value Ref Range Status   MRSA, PCR NEGATIVE NEGATIVE Final   Staphylococcus aureus NEGATIVE NEGATIVE Final    Comment: (NOTE) The Xpert SA Assay (FDA approved for NASAL specimens in patients 67 years of age and older), is one component of a comprehensive surveillance program. It is not intended to diagnose infection nor to guide or monitor treatment. Performed at [facility], [place] [address], [place], [place] [address]      Studies: No results found.  Scheduled Meds: . dexamethasone  4 mg Intravenous Q6H  . enoxaparin (LOVENOX) injection  40 mg Subcutaneous Q24H  . escitalopram  10 mg Oral QHS  . fluticasone  1 spray Each Nare Daily  . hydrALAZINE  10 mg Oral Q8H  . levothyroxine  75 mcg Oral QAC breakfast  . montelukast  10 mg Oral QHS   Continuous Infusions: . sodium chloride 75 mL/hr at 12/02/17 2257    Principal Problem:   Hyponatremia Active Problems:   Hypothyroidism   Insulin resistance   Hashimoto's disease   Cervical spondylolysis   Anxiety    Time spent: 35 minutes    [place]. If 7PM-7AM, please contact night-coverage at [email or web address], password [facility] 12/03/2017, 1:42 PM  LOS: 0 days

## 2017-12-04 DIAGNOSIS — E871 Hypo-osmolality and hyponatremia: Secondary | ICD-10-CM | POA: Diagnosis not present

## 2017-12-04 DIAGNOSIS — R2681 Unsteadiness on feet: Secondary | ICD-10-CM | POA: Diagnosis not present

## 2017-12-04 DIAGNOSIS — M4302 Spondylolysis, cervical region: Secondary | ICD-10-CM | POA: Diagnosis not present

## 2017-12-04 DIAGNOSIS — E039 Hypothyroidism, unspecified: Secondary | ICD-10-CM | POA: Diagnosis not present

## 2017-12-04 LAB — BASIC METABOLIC PANEL
Anion gap: 7 (ref 5–15)
BUN: 11 mg/dL (ref 8–23)
CO2: 25 mmol/L (ref 22–32)
Calcium: 8.7 mg/dL — ABNORMAL LOW (ref 8.9–10.3)
Chloride: 100 mmol/L (ref 98–111)
Creatinine, Ser: 0.8 mg/dL (ref 0.44–1.00)
GFR calc Af Amer: >60 mL/min (ref >60)
GFR calc non Af Amer: >60 mL/min (ref >60)
Glucose, Bld: 122 mg/dL — ABNORMAL HIGH (ref 70–99)
Potassium: 3.9 mmol/L (ref 3.5–5.1)
Sodium: 132 mmol/L — ABNORMAL LOW (ref 135–145)

## 2017-12-04 LAB — CBC
HCT: 35.4 % — ABNORMAL LOW (ref 36.0–46.0)
Hemoglobin: 11.6 g/dL — ABNORMAL LOW (ref 12.0–15.0)
MCH: 28 pg (ref 26.0–34.0)
MCHC: 32.8 g/dL (ref 30.0–36.0)
MCV: 85.5 fL (ref 78.0–100.0)
Platelets: 358 10*3/uL (ref 150–400)
RBC: 4.14 MIL/uL (ref 3.87–5.11)
RDW: 14.9 % (ref 11.5–15.5)
WBC: 11.9 10*3/uL — ABNORMAL HIGH (ref 4.0–10.5)

## 2017-12-04 LAB — MAGNESIUM: Magnesium: 1.6 mg/dL — ABNORMAL LOW (ref 1.7–2.4)

## 2017-12-04 MED ORDER — MAGNESIUM SULFATE 2 GM/50ML IV SOLN
2.0000 g | Freq: Once | INTRAVENOUS | Status: AC
  Administered 2017-12-04: 2 g via INTRAVENOUS
  Filled 2017-12-04: qty 50

## 2017-12-04 NOTE — Progress Notes (Signed)
Reviewed notes. I see patient is going to remain inpatient until surgery Thursday due to concerns with home safety. Will continue with plan for ACDF Thursday.

## 2017-12-04 NOTE — Progress Notes (Addendum)
[facility] PROGRESS NOTE  Loraina Stauffer Carchi TFT:[id number] DOB: [birth year] DOA: 12/01/2017 PCP: Maurice Small, MD  Regina [name] is a 77 y.o. female with medical history significant of cervical spondylosis, was seen by neuro surgery last week and was tentatively on schedule for surgery on Thursday, comes in for an episode of syncope, . She reports she got up to go to the bathroom last night and felt dizzy and passed out.   Assessment/Plan: Hyponatremia - likely related to  hydrochlorothiazide and lexapro, dehydration, severe pain in the neck.  -sodium slowly improved with IVF -holding HCTZ -slight drop on 9/3 with resumption of lexapro on 9/2 -fluid restrict  Syncope:  -Suspect from hyponatremia, orthostatic hypotension and dehydration  -possible pain as well or weakness from neck -echo: - Left ventricle: The cavity size was normal. Wall thickness was   normal. Systolic function was normal. The estimated ejection   fraction was in the range of 60% to 65%. Wall motion was normal;   there were no regional wall motion abnormalities. Left   ventricular diastolic function parameters were normal.  Hypothyroidism: -tsh 0.895 -continue synthroid.  hypomagnesium -replace   Cervical spondylosis:  -surgery scheduled for Thursday -PT eval-- PT confirmed my suspicions, that patient is at increased risk of falls due to weakness from cervical spondylosis -d/c decadron (appears to be started in the ER but not recommended by neurosurgery)  Code Status: full Family Communication: none Disposition Plan: surgery on Thursday- CIR is suggested by PT after surgery-- patient is not safe to go home before surgery due to fall risk from unsteady gait   Consultants:  neurosurgery   HPI/Subjective: Continues to have trouble with ambulation  Objective: Vitals:   12/04/17 0255 12/04/17 0500  BP: (!) 158/90 (!) 155/67  Pulse:  69  Resp:  18  Temp:  98.5 F (36.9  C)  SpO2:  98%    Intake/Output Summary (Last 24 hours) at 12/04/2017 1250 Last data filed at 12/04/2017 7062 Gross per 24 hour  Intake 1053.99 ml  Output -  Net 1053.99 ml   Filed Weights   12/01/17 1131 12/01/17 1133 12/01/17 1909  Weight: 71.7 kg 71.7 kg 72.8 kg    Exam: Requiring assistance getting up from chair to go to restroom rrr No increased work of breathing, lung fields clear No LE edema No rashes or lesions In good spirits  Data Reviewed: Basic Metabolic Panel: Recent Labs  Lab 11/29/17 1337 12/01/17 1202 12/01/17 1209 12/02/17 0514 12/03/17 0424 12/04/17 0351  NA 128* 122* 124* 128* 134* 132*  K 3.7 3.5 3.5 3.5 3.9 3.9  CL 88* 85* 87* 94* 99 100  CO2 27 23  --  24 27 25   GLUCOSE 95 131* 127* 132* 137* 122*  BUN 13 14 17 9 9 11   CREATININE 0.86 0.80 0.70 0.78 0.72 0.80  CALCIUM 9.5 9.1  --  8.8* 9.1 8.7*  MG  --   --   --   --   --  1.6*   Liver Function Tests: Recent Labs  Lab 12/01/17 1202  AST 17  ALT 12  ALKPHOS 59  BILITOT 0.7  PROT 6.2*  ALBUMIN 3.8   No results for input(s): LIPASE, AMYLASE in the last 168 hours. No results for input(s): AMMONIA in the last 168 hours. CBC: Recent Labs  Lab 11/29/17 1337 12/01/17 1202 12/01/17 1209 12/04/17 0351  WBC 7.7 11.8*  --  11.9*  NEUTROABS  --  9.9*  --   --  HGB 12.8 12.9 13.6 11.6*  HCT 38.6 38.2 40.0 35.4*  MCV 86.4 84.7  --  85.5  PLT 344 314  --  358   Cardiac Enzymes: Recent Labs  Lab 12/01/17 1202  CKTOTAL 96   BNP (last 3 results) No results for input(s): BNP in the last 8760 hours.  ProBNP (last 3 results) No results for input(s): PROBNP in the last 8760 hours.  CBG: Recent Labs  Lab 12/02/17 0909 12/02/17 1212  GLUCAP 123* 141*    Recent Results (from the past 240 hour(s))  Surgical pcr screen     Status: None   Collection Time: 11/29/17  1:37 PM  Result Value Ref Range Status   MRSA, PCR NEGATIVE NEGATIVE Final   Staphylococcus aureus NEGATIVE NEGATIVE  Final    Comment: (NOTE) The Xpert SA Assay (FDA approved for NASAL specimens in patients 55 years of age and older), is one component of a comprehensive surveillance program. It is not intended to diagnose infection nor to guide or monitor treatment. Performed at [facility], [place] [address] [place], [place] [address]      Studies: No results found.  Scheduled Meds: . dexamethasone  4 mg Intravenous Q12H  . enoxaparin (LOVENOX) injection  40 mg Subcutaneous Q24H  . escitalopram  10 mg Oral QHS  . fluticasone  1 spray Each Nare Daily  . hydrALAZINE  10 mg Oral Q8H  . levothyroxine  75 mcg Oral QAC breakfast  . montelukast  10 mg Oral QHS   Continuous Infusions:   Principal Problem:   Hyponatremia Active Problems:   Hypothyroidism   Insulin resistance   Hashimoto's disease   Cervical spondylolysis   Anxiety    Time spent: 35 minutes    [place]. If 7PM-7AM, please contact night-coverage at [email or web address], password [facility] 12/04/2017, 12:50 PM  LOS: 0 days

## 2017-12-05 DIAGNOSIS — E871 Hypo-osmolality and hyponatremia: Secondary | ICD-10-CM | POA: Diagnosis not present

## 2017-12-05 LAB — OSMOLALITY: Osmolality: 272 mOsm/kg — ABNORMAL LOW (ref 275–295)

## 2017-12-05 LAB — BASIC METABOLIC PANEL
Anion gap: 9 (ref 5–15)
BUN: 10 mg/dL (ref 8–23)
CO2: 26 mmol/L (ref 22–32)
Calcium: 8.8 mg/dL — ABNORMAL LOW (ref 8.9–10.3)
Chloride: 99 mmol/L (ref 98–111)
Creatinine, Ser: 0.91 mg/dL (ref 0.44–1.00)
GFR calc Af Amer: >60 mL/min (ref >60)
GFR calc non Af Amer: 59 mL/min — ABNORMAL LOW (ref >60)
Glucose, Bld: 98 mg/dL (ref 70–99)
Potassium: 3.8 mmol/L (ref 3.5–5.1)
Sodium: 134 mmol/L — ABNORMAL LOW (ref 135–145)

## 2017-12-05 LAB — GLUCOSE, CAPILLARY: Glucose-Capillary: 127 mg/dL — ABNORMAL HIGH (ref 70–99)

## 2017-12-05 LAB — MAGNESIUM: Magnesium: 1.9 mg/dL (ref 1.7–2.4)

## 2017-12-05 MED ORDER — HYDRALAZINE HCL 25 MG PO TABS
25.0000 mg | ORAL_TABLET | Freq: Three times a day (TID) | ORAL | Status: DC
  Administered 2017-12-05 – 2017-12-09 (×11): 25 mg via ORAL
  Filled 2017-12-05 (×10): qty 1

## 2017-12-05 NOTE — Progress Notes (Signed)
Occupational Therapy Treatment Patient Details Name: Regina [name] MRN: [id number] DOB: 1940/09/12 Today's Date: 12/05/2017    History of present illness Pt is a 77 y/o female admitted secondary to possible syncope. Pt found to have hyponatremia as well. 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 PMH includes hashimotos disease, asthma, and cervical spondylosis.    OT comments  Pt ambulated in room with RW and min guard assist. Stood at sink to comb hair and then needed to sit to complete denture care. Instructed in compensatory strategies to avoid denture breakage as pt has decreased sensation and strength in hands. Pt highly motivated and determined to return to independence. Pt to undergo cervical surgery tomorrow.   Follow Up Recommendations  CIR;Supervision/Assistance - 24 hour    Equipment Recommendations       Recommendations for Other Services      Precautions / Restrictions Precautions Precautions: Fall Restrictions Weight Bearing Restrictions: No       Mobility Bed Mobility               General bed mobility comments: pt in chair  Transfers Overall transfer level: Needs assistance Equipment used: Rolling walker (2 wheeled) Transfers: Sit to/from Stand Sit to Stand: Min guard         General transfer comment: no physical assist, increased time    Balance     Sitting balance-Leahy Scale: Fair       Standing balance-Leahy Scale: Poor Standing balance comment: able to stand at sink with min guard assist                           ADL either performed or assessed with clinical judgement   ADL Overall ADL's : Needs assistance/impaired Eating/Feeding: Minimal assistance;Sitting;With adaptive utensils Eating/Feeding Details (indicate cue type and reason): assist to open containers Grooming: Oral care;Brushing hair;Sitting;Standing;Min guard Grooming Details (indicate cue type and  reason): cues to use towel under dentures to avoid breakage, use of red tubing on toothbrush                 Toilet Transfer: Min guard;Ambulation;RW   Toileting- Clothing Manipulation and Hygiene: Minimal assistance;Sit to/from stand       Functional mobility during ADLs: Min guard;Rolling walker General ADL Comments: Pt requires visual monitoring during fine motor activities due to impaired sensation.     Vision       Perception     Praxis      Cognition Arousal/Alertness: Awake/alert Behavior During Therapy: WFL for tasks assessed/performed Overall Cognitive Status: Within Functional Limits for tasks assessed                                          Exercises     Shoulder Instructions       General Comments      Pertinent Vitals/ Pain       Pain Assessment: No/denies pain  Home Living                                          Prior Functioning/Environment              Frequency  Min 3X/week  Progress Toward Goals  OT Goals(current goals can now be found in the care plan section)  Progress towards OT goals: Progressing toward goals  Acute Rehab OT Goals Patient Stated Goal: To be able to take care of myself again OT Goal Formulation: With patient Time For Goal Achievement: 12/17/17 Potential to Achieve Goals: Good  Plan Discharge plan remains appropriate    Co-evaluation                 AM-PAC PT "6 Clicks" Daily Activity     Outcome Measure   Help from another person eating meals?: A Little Help from another person taking care of personal grooming?: A Little Help from another person toileting, which includes using toliet, bedpan, or urinal?: A Lot Help from another person bathing (including washing, rinsing, drying)?: A Little Help from another person to put on and taking off regular upper body clothing?: A Lot Help from another person to put on and taking off regular lower body  clothing?: Total 6 Click Score: 14    End of Session Equipment Utilized During Treatment: Rolling walker;Gait belt  OT Visit Diagnosis: Other abnormalities of gait and mobility (R26.89);Unsteadiness on feet (R26.81);History of falling (Z91.81);Muscle weakness (generalized) (M62.81)   Activity Tolerance Patient tolerated treatment well   Patient Left in chair;with call bell/phone within reach   Nurse Communication          Time: 8882-8003 OT Time Calculation (min): 58 min  Charges: OT General Charges $OT Visit: 1 Visit OT Treatments $Self Care/Home Management : 53-67 mins  12/05/2017 Nestor Lewandowsky, OTR/L Pager: [phone]   Werner Lean, Haze Boyden 12/05/2017, 11:31 AM

## 2017-12-05 NOTE — Progress Notes (Signed)
[facility] PROGRESS NOTE  Regina [name] EYC:[id number] DOB: 09/14/1940 DOA: 12/01/2017 PCP: Maurice Small, MD  Regina [name] is a 77 y.o. female with medical history significant of cervical spondylosis, was seen by neuro surgery last week and was tentatively scheduled for surgery tomorrow.  Patient was admitted with syncope and hyponatremia.  According to the patient, the cervical spine is impinging on the spinal cause, leading to weakness of the hands and legs.  Hyponatremia has improved significantly.  We will continue to work patient will follow with primary etiology.  No fever or chills, no chest pain, no shortness of breath, no other constitutional symptoms reported.  Assessment/Plan: Hyponatremia - likely related to  hydrochlorothiazide and lexapro, dehydration, severe pain in the neck.  -sodium slowly improved with IVF -holding HCTZ -slight drop on 9/3 with resumption of lexapro on 9/2 -fluid restrict  12/05/2017: Will repeat urine sodium, urine osmolality and serum osmolality.  Syncope:  -Suspect from hyponatremia, orthostatic hypotension and dehydration  -possible pain as well or weakness from neck -echo: - Left ventricle: The cavity size was normal. Wall thickness was   normal. Systolic function was normal. The estimated ejection   fraction was in the range of 60% to 65%. Wall motion was normal;   there were no regional wall motion abnormalities. Left   ventricular diastolic function parameters were normal.  12/05/2017: Definitive cause is unclear, however, thought to be secondary to volume depletion and orthostasis.  Hypothyroidism: -tsh 0.895 -continue synthroid.  hypomagnesium -Monitor and replace   Cervical spondylosis:  -surgery scheduled for Thursday -PT eval-- PT confirmed my suspicions, that patient is at increased risk of falls due to weakness from cervical spondylosis -d/c decadron (appears to be started in the ER but not  recommended by neurosurgery) 12/05/2017: As per Neurosurgery team.  Code Status: full Family Communication: none Disposition Plan: surgery on Thursday- CIR is suggested by PT after surgery-- patient is not safe to go home before surgery due to fall risk from unsteady gait   Consultants:  neurosurgery   HPI/Subjective: No new complaints.  No fever chills.  No chest pain or shortness of breath.    Objective: Vitals:   12/05/17 0545 12/05/17 1515  BP: (!) 177/68   Pulse: 66   Resp:  20  Temp: 98.3 F (36.8 C) 98 F (36.7 C)  SpO2: 100% 99%    Intake/Output Summary (Last 24 hours) at 12/05/2017 1940 Last data filed at 12/04/2017 2300 Gross per 24 hour  Intake -  Output 1 ml  Net -1 ml   Filed Weights   12/01/17 1131 12/01/17 1133 12/01/17 1909  Weight: 71.7 kg 71.7 kg 72.8 kg    Exam: Nothing in any distress.  Patient is awake and alert.   Neck is supple.  There is no JVD.  Lungs clear to auscultation. CVS: S1-S2  Abdomen: Soft and nontender.  Organs are not palpable.  Bowel sounds are present. Neuro: Awake and alert.  Patient moves all limbs.   Extremities: No leg edema.  Data Reviewed: Basic Metabolic Panel: Recent Labs  Lab 12/01/17 1202 12/01/17 1209 12/02/17 0514 12/03/17 0424 12/04/17 0351 12/05/17 0347 12/05/17 1444  NA 122* 124* 128* 134* 132* 134*  --   K 3.5 3.5 3.5 3.9 3.9 3.8  --   CL 85* 87* 94* 99 100 99  --   CO2 23  --  24 27 25 26   --   GLUCOSE 131* 127* 132* 137* 122* 98  --  BUN 14 17 9 9 11 10   --   CREATININE 0.80 0.70 0.78 0.72 0.80 0.91  --   CALCIUM 9.1  --  8.8* 9.1 8.7* 8.8*  --   MG  --   --   --   --  1.6*  --  1.9   Liver Function Tests: Recent Labs  Lab 12/01/17 1202  AST 17  ALT 12  ALKPHOS 59  BILITOT 0.7  PROT 6.2*  ALBUMIN 3.8   No results for input(s): LIPASE, AMYLASE in the last 168 hours. No results for input(s): AMMONIA in the last 168 hours. CBC: Recent Labs  Lab 11/29/17 1337 12/01/17 1202  12/01/17 1209 12/04/17 0351  WBC 7.7 11.8*  --  11.9*  NEUTROABS  --  9.9*  --   --   HGB 12.8 12.9 13.6 11.6*  HCT 38.6 38.2 40.0 35.4*  MCV 86.4 84.7  --  85.5  PLT 344 314  --  358   Cardiac Enzymes: Recent Labs  Lab 12/01/17 1202  CKTOTAL 96   BNP (last 3 results) No results for input(s): BNP in the last 8760 hours.  ProBNP (last 3 results) No results for input(s): PROBNP in the last 8760 hours.  CBG: Recent Labs  Lab 12/02/17 0909 12/02/17 1212 12/05/17 1741  GLUCAP 123* 141* 127*    Recent Results (from the past 240 hour(s))  Surgical pcr screen     Status: None   Collection Time: 11/29/17  1:37 PM  Result Value Ref Range Status   MRSA, PCR NEGATIVE NEGATIVE Final   Staphylococcus aureus NEGATIVE NEGATIVE Final    Comment: (NOTE) The Xpert SA Assay (FDA approved for NASAL specimens in patients 48 years of age and older), is one component of a comprehensive surveillance program. It is not intended to diagnose infection nor to guide or monitor treatment. Performed at [facility], [place] [address], [place],  [address]      Studies: No results found.  Scheduled Meds: . enoxaparin (LOVENOX) injection  40 mg Subcutaneous Q24H  . escitalopram  10 mg Oral QHS  . fluticasone  1 spray Each Nare Daily  . hydrALAZINE  25 mg Oral Q8H  . levothyroxine  75 mcg Oral QAC breakfast  . montelukast  10 mg Oral QHS   Continuous Infusions:   Principal Problem:   Hyponatremia Active Problems:   Hypothyroidism   Insulin resistance   Hashimoto's disease   Cervical spondylolysis   Anxiety    Time spent: 25 minutes    [place]. If 7PM-7AM, please contact night-coverage at [email or web address], password [facility] 12/05/2017, 7:40 PM  LOS: 0 days

## 2017-12-06 ENCOUNTER — Encounter (HOSPITAL_COMMUNITY)
Admission: EM | Disposition: A | Discharge status: Rehabilitation Facility | Payer: Self-pay | Source: Home / Self Care | Attending: Internal Medicine

## 2017-12-06 ENCOUNTER — Inpatient Hospital Stay (HOSPITAL_COMMUNITY): Admission: RE | Admit: 2017-12-06 | Payer: Medicare Other | Source: Ambulatory Visit | Admitting: Neurosurgery

## 2017-12-06 ENCOUNTER — Inpatient Hospital Stay (HOSPITAL_COMMUNITY): Payer: Medicare Other

## 2017-12-06 ENCOUNTER — Encounter (HOSPITAL_COMMUNITY): Payer: Self-pay | Admitting: Certified Registered"

## 2017-12-06 ENCOUNTER — Observation Stay (HOSPITAL_COMMUNITY): Payer: Medicare Other | Admitting: Physician Assistant

## 2017-12-06 ENCOUNTER — Observation Stay (HOSPITAL_COMMUNITY): Payer: Medicare Other | Admitting: Certified Registered"

## 2017-12-06 DIAGNOSIS — Z79899 Other long term (current) drug therapy: Secondary | ICD-10-CM | POA: Diagnosis not present

## 2017-12-06 DIAGNOSIS — R2681 Unsteadiness on feet: Secondary | ICD-10-CM | POA: Diagnosis present

## 2017-12-06 DIAGNOSIS — M4302 Spondylolysis, cervical region: Secondary | ICD-10-CM | POA: Diagnosis not present

## 2017-12-06 DIAGNOSIS — Z880 Allergy status to penicillin: Secondary | ICD-10-CM | POA: Diagnosis not present

## 2017-12-06 DIAGNOSIS — E876 Hypokalemia: Secondary | ICD-10-CM | POA: Diagnosis present

## 2017-12-06 DIAGNOSIS — J45909 Unspecified asthma, uncomplicated: Secondary | ICD-10-CM | POA: Diagnosis not present

## 2017-12-06 DIAGNOSIS — M7989 Other specified soft tissue disorders: Secondary | ICD-10-CM | POA: Diagnosis not present

## 2017-12-06 DIAGNOSIS — E860 Dehydration: Secondary | ICD-10-CM | POA: Diagnosis present

## 2017-12-06 DIAGNOSIS — M4712 Other spondylosis with myelopathy, cervical region: Secondary | ICD-10-CM | POA: Diagnosis present

## 2017-12-06 DIAGNOSIS — M50222 Other cervical disc displacement at C5-C6 level: Secondary | ICD-10-CM | POA: Diagnosis present

## 2017-12-06 DIAGNOSIS — Z882 Allergy status to sulfonamides status: Secondary | ICD-10-CM | POA: Diagnosis not present

## 2017-12-06 DIAGNOSIS — Z886 Allergy status to analgesic agent status: Secondary | ICD-10-CM | POA: Diagnosis not present

## 2017-12-06 DIAGNOSIS — R1310 Dysphagia, unspecified: Secondary | ICD-10-CM | POA: Diagnosis not present

## 2017-12-06 DIAGNOSIS — D62 Acute posthemorrhagic anemia: Secondary | ICD-10-CM | POA: Diagnosis present

## 2017-12-06 DIAGNOSIS — E46 Unspecified protein-calorie malnutrition: Secondary | ICD-10-CM | POA: Diagnosis not present

## 2017-12-06 DIAGNOSIS — E063 Autoimmune thyroiditis: Secondary | ICD-10-CM | POA: Diagnosis present

## 2017-12-06 DIAGNOSIS — Z4789 Encounter for other orthopedic aftercare: Secondary | ICD-10-CM | POA: Diagnosis present

## 2017-12-06 DIAGNOSIS — Z881 Allergy status to other antibiotic agents status: Secondary | ICD-10-CM | POA: Diagnosis not present

## 2017-12-06 DIAGNOSIS — M2578 Osteophyte, vertebrae: Secondary | ICD-10-CM | POA: Diagnosis present

## 2017-12-06 DIAGNOSIS — E871 Hypo-osmolality and hyponatremia: Secondary | ICD-10-CM | POA: Diagnosis not present

## 2017-12-06 DIAGNOSIS — D72829 Elevated white blood cell count, unspecified: Secondary | ICD-10-CM | POA: Diagnosis present

## 2017-12-06 DIAGNOSIS — E8809 Other disorders of plasma-protein metabolism, not elsewhere classified: Secondary | ICD-10-CM | POA: Diagnosis present

## 2017-12-06 DIAGNOSIS — K5900 Constipation, unspecified: Secondary | ICD-10-CM | POA: Diagnosis present

## 2017-12-06 DIAGNOSIS — F419 Anxiety disorder, unspecified: Secondary | ICD-10-CM | POA: Diagnosis present

## 2017-12-06 DIAGNOSIS — Z888 Allergy status to other drugs, medicaments and biological substances status: Secondary | ICD-10-CM | POA: Diagnosis not present

## 2017-12-06 DIAGNOSIS — Z23 Encounter for immunization: Secondary | ICD-10-CM | POA: Diagnosis present

## 2017-12-06 DIAGNOSIS — E119 Type 2 diabetes mellitus without complications: Secondary | ICD-10-CM | POA: Diagnosis present

## 2017-12-06 DIAGNOSIS — Y92002 Bathroom of unspecified non-institutional (private) residence single-family (private) house as the place of occurrence of the external cause: Secondary | ICD-10-CM | POA: Diagnosis not present

## 2017-12-06 DIAGNOSIS — E8881 Metabolic syndrome: Secondary | ICD-10-CM | POA: Diagnosis present

## 2017-12-06 DIAGNOSIS — Z7982 Long term (current) use of aspirin: Secondary | ICD-10-CM | POA: Diagnosis not present

## 2017-12-06 DIAGNOSIS — W1830XA Fall on same level, unspecified, initial encounter: Secondary | ICD-10-CM | POA: Diagnosis present

## 2017-12-06 DIAGNOSIS — G959 Disease of spinal cord, unspecified: Secondary | ICD-10-CM | POA: Diagnosis present

## 2017-12-06 DIAGNOSIS — I10 Essential (primary) hypertension: Secondary | ICD-10-CM | POA: Diagnosis present

## 2017-12-06 DIAGNOSIS — Z7984 Long term (current) use of oral hypoglycemic drugs: Secondary | ICD-10-CM | POA: Diagnosis not present

## 2017-12-06 DIAGNOSIS — E222 Syndrome of inappropriate secretion of antidiuretic hormone: Secondary | ICD-10-CM | POA: Diagnosis present

## 2017-12-06 DIAGNOSIS — M1990 Unspecified osteoarthritis, unspecified site: Secondary | ICD-10-CM | POA: Diagnosis present

## 2017-12-06 DIAGNOSIS — I169 Hypertensive crisis, unspecified: Secondary | ICD-10-CM | POA: Diagnosis not present

## 2017-12-06 DIAGNOSIS — M4802 Spinal stenosis, cervical region: Secondary | ICD-10-CM | POA: Diagnosis present

## 2017-12-06 DIAGNOSIS — R55 Syncope and collapse: Secondary | ICD-10-CM | POA: Diagnosis present

## 2017-12-06 DIAGNOSIS — G8918 Other acute postprocedural pain: Secondary | ICD-10-CM | POA: Diagnosis not present

## 2017-12-06 DIAGNOSIS — F329 Major depressive disorder, single episode, unspecified: Secondary | ICD-10-CM | POA: Diagnosis present

## 2017-12-06 DIAGNOSIS — J449 Chronic obstructive pulmonary disease, unspecified: Secondary | ICD-10-CM | POA: Diagnosis present

## 2017-12-06 DIAGNOSIS — M5000 Cervical disc disorder with myelopathy, unspecified cervical region: Secondary | ICD-10-CM | POA: Diagnosis not present

## 2017-12-06 DIAGNOSIS — M436 Torticollis: Secondary | ICD-10-CM | POA: Diagnosis present

## 2017-12-06 DIAGNOSIS — Z9049 Acquired absence of other specified parts of digestive tract: Secondary | ICD-10-CM | POA: Diagnosis not present

## 2017-12-06 DIAGNOSIS — Z87891 Personal history of nicotine dependence: Secondary | ICD-10-CM | POA: Diagnosis not present

## 2017-12-06 HISTORY — PX: ANTERIOR CERVICAL DECOMP/DISCECTOMY FUSION: SHX1161

## 2017-12-06 LAB — RENAL FUNCTION PANEL
Albumin: 3.1 g/dL — ABNORMAL LOW (ref 3.5–5.0)
Anion gap: 7 (ref 5–15)
BUN: 11 mg/dL (ref 8–23)
CO2: 27 mmol/L (ref 22–32)
Calcium: 8.7 mg/dL — ABNORMAL LOW (ref 8.9–10.3)
Chloride: 100 mmol/L (ref 98–111)
Creatinine, Ser: 0.93 mg/dL (ref 0.44–1.00)
GFR calc Af Amer: >60 mL/min (ref >60)
GFR calc non Af Amer: 58 mL/min — ABNORMAL LOW (ref >60)
Glucose, Bld: 94 mg/dL (ref 70–99)
Phosphorus: 4.1 mg/dL (ref 2.5–4.6)
Potassium: 4.8 mmol/L (ref 3.5–5.1)
Sodium: 134 mmol/L — ABNORMAL LOW (ref 135–145)

## 2017-12-06 LAB — SURGICAL PCR SCREEN
MRSA, PCR: NEGATIVE
Staphylococcus aureus: NEGATIVE

## 2017-12-06 LAB — MAGNESIUM: Magnesium: 1.9 mg/dL (ref 1.7–2.4)

## 2017-12-06 SURGERY — ANTERIOR CERVICAL DECOMPRESSION/DISCECTOMY FUSION 3 LEVELS
Anesthesia: General | Site: Spine Cervical

## 2017-12-06 MED ORDER — VANCOMYCIN HCL IN DEXTROSE 1-5 GM/200ML-% IV SOLN
1000.0000 mg | Freq: Two times a day (BID) | INTRAVENOUS | Status: AC
  Administered 2017-12-06: 1000 mg via INTRAVENOUS
  Filled 2017-12-06: qty 200

## 2017-12-06 MED ORDER — CHLORHEXIDINE GLUCONATE CLOTH 2 % EX PADS: 6.0000 | MEDICATED_PAD | Freq: Once | CUTANEOUS | Status: DC

## 2017-12-06 MED ORDER — POLYETHYLENE GLYCOL 3350 17 G PO PACK: 17.0000 g | PACK | Freq: Every day | ORAL | Status: DC | PRN

## 2017-12-06 MED ORDER — LIDOCAINE 2% (20 MG/ML) 5 ML SYRINGE
INTRAMUSCULAR | Status: AC
  Filled 2017-12-06: qty 5

## 2017-12-06 MED ORDER — OXYCODONE HCL 5 MG/5ML PO SOLN: 5.0000 mg | Freq: Once | ORAL | Status: DC | PRN

## 2017-12-06 MED ORDER — FENTANYL CITRATE (PF) 100 MCG/2ML IJ SOLN
25.0000 ug | INTRAMUSCULAR | Status: DC | PRN
  Administered 2017-12-06 (×2): 50 ug via INTRAVENOUS

## 2017-12-06 MED ORDER — FENTANYL CITRATE (PF) 250 MCG/5ML IJ SOLN
INTRAMUSCULAR | Status: DC | PRN
  Administered 2017-12-06: 25 ug via INTRAVENOUS
  Administered 2017-12-06 (×2): 50 ug via INTRAVENOUS
  Administered 2017-12-06: 75 ug via INTRAVENOUS
  Administered 2017-12-06: 50 ug via INTRAVENOUS

## 2017-12-06 MED ORDER — DOCUSATE SODIUM 100 MG PO CAPS
100.0000 mg | ORAL_CAPSULE | Freq: Two times a day (BID) | ORAL | Status: DC
  Administered 2017-12-06: 100 mg via ORAL
  Filled 2017-12-06: qty 1

## 2017-12-06 MED ORDER — ONDANSETRON HCL 4 MG/2ML IJ SOLN
INTRAMUSCULAR | Status: DC | PRN
  Administered 2017-12-06: 4 mg via INTRAVENOUS

## 2017-12-06 MED ORDER — BUPIVACAINE HCL 0.5 % IJ SOLN
INTRAMUSCULAR | Status: DC | PRN
  Administered 2017-12-06: 4 mL

## 2017-12-06 MED ORDER — ROCURONIUM BROMIDE 10 MG/ML (PF) SYRINGE
PREFILLED_SYRINGE | INTRAVENOUS | Status: DC | PRN
  Administered 2017-12-06: 20 mg via INTRAVENOUS
  Administered 2017-12-06: 10 mg via INTRAVENOUS
  Administered 2017-12-06: 50 mg via INTRAVENOUS

## 2017-12-06 MED ORDER — DEXAMETHASONE SODIUM PHOSPHATE 10 MG/ML IJ SOLN
INTRAMUSCULAR | Status: DC | PRN
  Administered 2017-12-06: 10 mg via INTRAVENOUS

## 2017-12-06 MED ORDER — THROMBIN 5000 UNITS EX SOLR
OROMUCOSAL | Status: DC | PRN
  Administered 2017-12-06: 5 mL via TOPICAL

## 2017-12-06 MED ORDER — SUGAMMADEX SODIUM 200 MG/2ML IV SOLN
INTRAVENOUS | Status: DC | PRN
  Administered 2017-12-06: 160 mg via INTRAVENOUS

## 2017-12-06 MED ORDER — PROPOFOL 10 MG/ML IV BOLUS
INTRAVENOUS | Status: DC | PRN
  Administered 2017-12-06: 140 mg via INTRAVENOUS

## 2017-12-06 MED ORDER — PANTOPRAZOLE SODIUM 20 MG PO TBEC
20.0000 mg | DELAYED_RELEASE_TABLET | Freq: Every day | ORAL | Status: DC
  Administered 2017-12-06: 20 mg via ORAL
  Filled 2017-12-06 (×2): qty 1

## 2017-12-06 MED ORDER — GLYCOPYRROLATE PF 0.2 MG/ML IJ SOSY
PREFILLED_SYRINGE | INTRAMUSCULAR | Status: DC | PRN
  Administered 2017-12-06: .2 mg via INTRAVENOUS

## 2017-12-06 MED ORDER — ONDANSETRON HCL 4 MG PO TABS: 4.0000 mg | ORAL_TABLET | Freq: Four times a day (QID) | ORAL | Status: DC | PRN

## 2017-12-06 MED ORDER — VANCOMYCIN HCL IN DEXTROSE 1-5 GM/200ML-% IV SOLN
INTRAVENOUS | Status: AC
  Filled 2017-12-06: qty 200

## 2017-12-06 MED ORDER — FLEET ENEMA 7-19 GM/118ML RE ENEM: 1.0000 | ENEMA | Freq: Once | RECTAL | Status: DC | PRN

## 2017-12-06 MED ORDER — ROCURONIUM BROMIDE 50 MG/5ML IV SOSY
PREFILLED_SYRINGE | INTRAVENOUS | Status: AC
  Filled 2017-12-06: qty 10

## 2017-12-06 MED ORDER — SODIUM CHLORIDE 0.9 % IV SOLN
INTRAVENOUS | Status: DC | PRN
  Administered 2017-12-06: 50 ug/min via INTRAVENOUS

## 2017-12-06 MED ORDER — ESMOLOL HCL 100 MG/10ML IV SOLN
INTRAVENOUS | Status: DC | PRN
  Administered 2017-12-06: 30 mg via INTRAVENOUS

## 2017-12-06 MED ORDER — 0.9 % SODIUM CHLORIDE (POUR BTL) OPTIME
TOPICAL | Status: DC | PRN
  Administered 2017-12-06: 1000 mL

## 2017-12-06 MED ORDER — SODIUM CHLORIDE 0.9 % IV SOLN
INTRAVENOUS | Status: DC
  Administered 2017-12-06 – 2017-12-07 (×2): via INTRAVENOUS

## 2017-12-06 MED ORDER — GLYCOPYRROLATE PF 0.2 MG/ML IJ SOSY
PREFILLED_SYRINGE | INTRAMUSCULAR | Status: AC
  Filled 2017-12-06: qty 1

## 2017-12-06 MED ORDER — LIDOCAINE-EPINEPHRINE 1 %-1:100000 IJ SOLN
INTRAMUSCULAR | Status: DC | PRN
  Administered 2017-12-06: 4 mL

## 2017-12-06 MED ORDER — INFLUENZA VAC SPLIT HIGH-DOSE 0.5 ML IM SUSY
0.5000 mL | PREFILLED_SYRINGE | INTRAMUSCULAR | Status: AC
  Administered 2017-12-07: 0.5 mL via INTRAMUSCULAR
  Filled 2017-12-06: qty 0.5

## 2017-12-06 MED ORDER — OXYCODONE HCL 5 MG PO TABS
5.0000 mg | ORAL_TABLET | ORAL | Status: DC | PRN
  Administered 2017-12-07 – 2017-12-09 (×2): 5 mg via ORAL
  Filled 2017-12-06 (×2): qty 1

## 2017-12-06 MED ORDER — THROMBIN 20000 UNITS EX KIT
PACK | CUTANEOUS | Status: DC | PRN
  Administered 2017-12-06: 20000 [IU] via TOPICAL

## 2017-12-06 MED ORDER — SODIUM CHLORIDE 0.9 % IV SOLN
250.0000 mL | INTRAVENOUS | Status: DC
  Administered 2017-12-08: 1000 mL via INTRAVENOUS

## 2017-12-06 MED ORDER — DEXAMETHASONE SODIUM PHOSPHATE 10 MG/ML IJ SOLN
INTRAMUSCULAR | Status: AC
  Filled 2017-12-06: qty 1

## 2017-12-06 MED ORDER — EPHEDRINE 5 MG/ML INJ
INTRAVENOUS | Status: AC
  Filled 2017-12-06: qty 10

## 2017-12-06 MED ORDER — FENTANYL CITRATE (PF) 250 MCG/5ML IJ SOLN
INTRAMUSCULAR | Status: AC
  Filled 2017-12-06: qty 5

## 2017-12-06 MED ORDER — SODIUM CHLORIDE 0.9% FLUSH
3.0000 mL | Freq: Two times a day (BID) | INTRAVENOUS | Status: DC
  Administered 2017-12-06 – 2017-12-10 (×6): 3 mL via INTRAVENOUS

## 2017-12-06 MED ORDER — ONDANSETRON HCL 4 MG/2ML IJ SOLN: 4.0000 mg | Freq: Once | INTRAMUSCULAR | Status: DC | PRN

## 2017-12-06 MED ORDER — OXYCODONE HCL 5 MG PO TABS
10.0000 mg | ORAL_TABLET | ORAL | Status: DC | PRN
  Administered 2017-12-06 – 2017-12-08 (×9): 10 mg via ORAL
  Filled 2017-12-06 (×9): qty 2

## 2017-12-06 MED ORDER — BISACODYL 10 MG RE SUPP: 10.0000 mg | Freq: Every day | RECTAL | Status: DC | PRN

## 2017-12-06 MED ORDER — MUPIROCIN 2 % EX OINT
1.0000 "application " | TOPICAL_OINTMENT | Freq: Two times a day (BID) | CUTANEOUS | Status: AC
  Administered 2017-12-06 – 2017-12-08 (×4): 1 via NASAL
  Filled 2017-12-06: qty 22

## 2017-12-06 MED ORDER — CHLORHEXIDINE GLUCONATE 4 % EX LIQD
Freq: Once | CUTANEOUS | Status: DC
  Filled 2017-12-06: qty 15

## 2017-12-06 MED ORDER — LACTATED RINGERS IV SOLN
INTRAVENOUS | Status: DC | PRN
  Administered 2017-12-06 (×2): via INTRAVENOUS

## 2017-12-06 MED ORDER — SODIUM CHLORIDE 0.9 % IV SOLN
INTRAVENOUS | Status: DC | PRN
  Administered 2017-12-06: 500 mL

## 2017-12-06 MED ORDER — SODIUM CHLORIDE 0.9% FLUSH: 3.0000 mL | INTRAVENOUS | Status: DC | PRN

## 2017-12-06 MED ORDER — PHENOL 1.4 % MT LIQD: 1.0000 | OROMUCOSAL | Status: DC | PRN

## 2017-12-06 MED ORDER — PROPOFOL 10 MG/ML IV BOLUS
INTRAVENOUS | Status: AC
  Filled 2017-12-06: qty 20

## 2017-12-06 MED ORDER — MENTHOL 3 MG MT LOZG
1.0000 | LOZENGE | OROMUCOSAL | Status: DC | PRN
  Administered 2017-12-08: 3 mg via ORAL
  Filled 2017-12-06: qty 9

## 2017-12-06 MED ORDER — HEMOSTATIC AGENTS (NO CHARGE) OPTIME
TOPICAL | Status: DC | PRN
  Administered 2017-12-06: 1 via TOPICAL

## 2017-12-06 MED ORDER — OXYCODONE HCL 5 MG PO TABS: 5.0000 mg | ORAL_TABLET | Freq: Once | ORAL | Status: DC | PRN

## 2017-12-06 MED ORDER — ESMOLOL HCL 100 MG/10ML IV SOLN
INTRAVENOUS | Status: AC
  Filled 2017-12-06: qty 10

## 2017-12-06 MED ORDER — ONDANSETRON HCL 4 MG/2ML IJ SOLN
INTRAMUSCULAR | Status: AC
  Filled 2017-12-06: qty 2

## 2017-12-06 MED ORDER — LIDOCAINE 2% (20 MG/ML) 5 ML SYRINGE
INTRAMUSCULAR | Status: DC | PRN
  Administered 2017-12-06: 80 mg via INTRAVENOUS

## 2017-12-06 MED ORDER — ONDANSETRON HCL 4 MG/2ML IJ SOLN: 4.0000 mg | Freq: Four times a day (QID) | INTRAMUSCULAR | Status: DC | PRN

## 2017-12-06 MED ORDER — VANCOMYCIN HCL IN DEXTROSE 1-5 GM/200ML-% IV SOLN
1000.0000 mg | INTRAVENOUS | Status: AC
  Administered 2017-12-06: 1000 mg via INTRAVENOUS

## 2017-12-06 MED ORDER — SENNA 8.6 MG PO TABS
1.0000 | ORAL_TABLET | Freq: Two times a day (BID) | ORAL | Status: DC
  Administered 2017-12-06 – 2017-12-09 (×7): 8.6 mg via ORAL
  Filled 2017-12-06 (×7): qty 1

## 2017-12-06 MED ORDER — PHENYLEPHRINE 40 MCG/ML (10ML) SYRINGE FOR IV PUSH (FOR BLOOD PRESSURE SUPPORT)
PREFILLED_SYRINGE | INTRAVENOUS | Status: AC
  Filled 2017-12-06: qty 10

## 2017-12-06 SURGICAL SUPPLY — 68 items
BAG DECANTER FOR FLEXI CONT (MISCELLANEOUS) ×2 IMPLANT
BENZOIN TINCTURE PRP APPL 2/3 (GAUZE/BANDAGES/DRESSINGS) IMPLANT
BLADE CLIPPER SURG (BLADE) IMPLANT
BLADE SURG 11 STRL SS (BLADE) ×2 IMPLANT
BLADE ULTRA TIP 2M (BLADE) IMPLANT
BUR MATCHSTICK NEURO 3.0 LAGG (BURR) ×2 IMPLANT
CAGE EXPANSE 8X6X6 (Cage) ×6 IMPLANT
CAGE PEEK 6X14X11 (Cage) ×6 IMPLANT
CANISTER SUCT 3000ML PPV (MISCELLANEOUS) ×2 IMPLANT
CARTRIDGE OIL MAESTRO DRILL (MISCELLANEOUS) ×1 IMPLANT
DECANTER SPIKE VIAL GLASS SM (MISCELLANEOUS) ×2 IMPLANT
DERMABOND ADVANCED (GAUZE/BANDAGES/DRESSINGS) ×1
DERMABOND ADVANCED .7 DNX12 (GAUZE/BANDAGES/DRESSINGS) ×1 IMPLANT
DIFFUSER DRILL AIR PNEUMATIC (MISCELLANEOUS) ×2 IMPLANT
DRAIN CHANNEL 10M FLAT 3/4 FLT (DRAIN) IMPLANT
DRAPE C-ARM 42X72 X-RAY (DRAPES) ×4 IMPLANT
DRAPE HALF SHEET 40X57 (DRAPES) ×2 IMPLANT
DRAPE LAPAROTOMY 100X72 PEDS (DRAPES) ×2 IMPLANT
DRAPE MICROSCOPE LEICA (MISCELLANEOUS) ×2 IMPLANT
DRSG OPSITE POSTOP 3X4 (GAUZE/BANDAGES/DRESSINGS) ×4 IMPLANT
DURAPREP 6ML APPLICATOR 50/CS (WOUND CARE) ×2 IMPLANT
ELECT COATED BLADE 2.86 ST (ELECTRODE) ×2 IMPLANT
EVACUATOR SILICONE 100CC (DRAIN) IMPLANT
GAUZE 4X4 16PLY RFD (DISPOSABLE) IMPLANT
GLOVE BIO SURGEON STRL SZ 6.5 (GLOVE) ×2 IMPLANT
GLOVE BIO SURGEON STRL SZ7.5 (GLOVE) ×2 IMPLANT
GLOVE BIO SURGEON STRL SZ8 (GLOVE) ×2 IMPLANT
GLOVE BIOGEL PI IND STRL 6.5 (GLOVE) ×1 IMPLANT
GLOVE BIOGEL PI IND STRL 7.0 (GLOVE) ×4 IMPLANT
GLOVE BIOGEL PI IND STRL 7.5 (GLOVE) ×2 IMPLANT
GLOVE BIOGEL PI INDICATOR 6.5 (GLOVE) ×1
GLOVE BIOGEL PI INDICATOR 7.0 (GLOVE) ×4
GLOVE BIOGEL PI INDICATOR 7.5 (GLOVE) ×2
GLOVE ECLIPSE 7.0 STRL STRAW (GLOVE) ×4 IMPLANT
GLOVE EXAM NITRILE LRG STRL (GLOVE) IMPLANT
GLOVE EXAM NITRILE XL STR (GLOVE) IMPLANT
GLOVE EXAM NITRILE XS STR PU (GLOVE) IMPLANT
GLOVE INDICATOR 8.5 STRL (GLOVE) ×2 IMPLANT
GOWN STRL REUS W/ TWL LRG LVL3 (GOWN DISPOSABLE) ×3 IMPLANT
GOWN STRL REUS W/ TWL XL LVL3 (GOWN DISPOSABLE) ×3 IMPLANT
GOWN STRL REUS W/TWL 2XL LVL3 (GOWN DISPOSABLE) IMPLANT
GOWN STRL REUS W/TWL LRG LVL3 (GOWN DISPOSABLE) ×3
GOWN STRL REUS W/TWL XL LVL3 (GOWN DISPOSABLE) ×3
HEMOSTAT POWDER KIT SURGIFOAM (HEMOSTASIS) ×2 IMPLANT
KIT BASIN OR (CUSTOM PROCEDURE TRAY) ×2 IMPLANT
KIT TURNOVER KIT B (KITS) ×2 IMPLANT
NEEDLE HYPO 22GX1.5 SAFETY (NEEDLE) ×2 IMPLANT
NEEDLE SPNL 22GX3.5 QUINCKE BK (NEEDLE) ×2 IMPLANT
NS IRRIG 1000ML POUR BTL (IV SOLUTION) ×2 IMPLANT
OIL CARTRIDGE MAESTRO DRILL (MISCELLANEOUS) ×2
PACK LAMINECTOMY NEURO (CUSTOM PROCEDURE TRAY) ×2 IMPLANT
PAD ARMBOARD 7.5X6 YLW CONV (MISCELLANEOUS) ×6 IMPLANT
PIN DISTRACTION 14MM (PIN) ×4 IMPLANT
PLATE 3 57.5XLCK NS SPNE CVD (Plate) ×1 IMPLANT
PLATE 3 ATLANTIS TRANS (Plate) ×1 IMPLANT
RUBBERBAND STERILE (MISCELLANEOUS) ×4 IMPLANT
SCREW SELF TAP VAR 4.0X13 (Screw) ×16 IMPLANT
SPONGE INTESTINAL PEANUT (DISPOSABLE) ×2 IMPLANT
SPONGE SURGIFOAM ABS GEL 100 (HEMOSTASIS) ×2 IMPLANT
STRIP CLOSURE SKIN 1/2X4 (GAUZE/BANDAGES/DRESSINGS) IMPLANT
SUT VIC AB 0 CT1 27 (SUTURE) ×1
SUT VIC AB 0 CT1 27XBRD ANBCTR (SUTURE) ×1 IMPLANT
SUT VIC AB 3-0 SH 8-18 (SUTURE) ×2 IMPLANT
SUT VICRYL 3-0 RB1 18 ABS (SUTURE) ×2 IMPLANT
TAPE CLOTH 3X10 TAN LF (GAUZE/BANDAGES/DRESSINGS) ×2 IMPLANT
TOWEL GREEN STERILE (TOWEL DISPOSABLE) ×2 IMPLANT
TOWEL GREEN STERILE FF (TOWEL DISPOSABLE) ×2 IMPLANT
WATER STERILE IRR 1000ML POUR (IV SOLUTION) ×2 IMPLANT

## 2017-12-06 NOTE — Progress Notes (Signed)
Patient has been kept NPO after midnight for ACDF. BP elevated 189/79, scheduled hydralazine 25 mg administered. Will recheck BP in an hour and continue to monitor.

## 2017-12-06 NOTE — Anesthesia Preprocedure Evaluation (Addendum)
Anesthesia Evaluation  Patient identified by MRN, date of birth, ID band Patient awake    Reviewed: Allergy & Precautions, NPO status , Patient's Chart, lab work & pertinent test results  History of Anesthesia Complications (+) PONV and history of anesthetic complications  Airway Mallampati: III  TM Distance: >3 FB Neck ROM: Limited    Dental  (+) Dental Advisory Given, Edentulous Upper   Pulmonary asthma , former smoker,    breath sounds clear to auscultation       Cardiovascular hypertension, Pt. on medications + Valvular Problems/Murmurs  Rhythm:Regular Rate:Normal   '19 TTE - EF 60% to 65%. Moderate MR. Left atrium was at the upper limits of normal in size. Mild-moderate TR. PASP: 41 mm Hg    Neuro/Psych Anxiety Depression  Bilateral UE weakness, numbness, tingling - constant, no change with neck movement     GI/Hepatic negative GI ROS, Neg liver ROS,   Endo/Other  diabetes, Type 2, Oral Hypoglycemic AgentsHypothyroidism  Hashimoto's thyroiditis   Renal/GU negative Renal ROS  negative genitourinary   Musculoskeletal  (+) Arthritis ,   Abdominal   Peds  Hematology  (+) anemia ,   Anesthesia Other Findings Admitted earlier in week with syncopal episode. Workup thus far not pointing towards cardiac cause, more likely dehydration/hyponatremia and orthostasis. Hyponatremia improved and fluid deficit corrected.  Reproductive/Obstetrics                            Anesthesia Physical Anesthesia Plan  ASA: II  Anesthesia Plan: General   Post-op Pain Management:    Induction: Intravenous  PONV Risk Score and Plan: 4 or greater and Treatment may vary due to age or medical condition, Ondansetron, Propofol infusion and Dexamethasone  Airway Management Planned: Oral ETT and Video Laryngoscope Planned  Additional Equipment: None  Intra-op Plan:   Post-operative Plan: Extubation in  OR  Informed Consent: I have reviewed the patients History and Physical, chart, labs and discussed the procedure including the risks, benefits and alternatives for the proposed anesthesia with the patient or authorized representative who has indicated his/her understanding and acceptance.   Dental advisory given  Plan Discussed with: CRNA and Anesthesiologist  Anesthesia Plan Comments:        Anesthesia Quick Evaluation

## 2017-12-06 NOTE — Anesthesia Postprocedure Evaluation (Signed)
Anesthesia Post Note  Patient: Regina [name]  Procedure(s) Performed: ANTERIOR CERVICAL DECOMPRESSION/DISCECTOMY FUSION, CERVICAL 3- CERVICAL 4, CERVICAL 4- CERVICAL 5, CERVICAL 5- CERVICAL 6 (N/A Spine Cervical)     Patient location during evaluation: PACU Anesthesia Type: General Level of consciousness: awake Pain management: pain level controlled Vital Signs Assessment: post-procedure vital signs reviewed and stable Respiratory status: spontaneous breathing Cardiovascular status: stable Postop Assessment: no apparent nausea or vomiting Anesthetic complications: no    Last Vitals:  Vitals:   12/06/17 1830 12/06/17 1838  BP:  (!) 147/59  Pulse: 74 72  Resp: 17 18  Temp: 36.4 C   SpO2: 96% 93%    Last Pain:  Vitals:   12/06/17 1830  TempSrc:   PainSc: Asleep    LLE Motor Response: Purposeful movement;Responds to commands (12/06/17 1830) LLE Sensation: Numbness (12/06/17 1830) RLE Motor Response: Purposeful movement;Responds to commands (12/06/17 1830) RLE Sensation: Numbness(as prev) (12/06/17 1830)      Shaka Zech

## 2017-12-06 NOTE — Transfer of Care (Signed)
Immediate Anesthesia Transfer of Care Note  Patient: Regina [name]  Procedure(s) Performed: ANTERIOR CERVICAL DECOMPRESSION/DISCECTOMY FUSION, CERVICAL 3- CERVICAL 4, CERVICAL 4- CERVICAL 5, CERVICAL 5- CERVICAL 6 (N/A Spine Cervical)  Patient Location: PACU  Anesthesia Type:General  Level of Consciousness: awake and alert   Airway & Oxygen Therapy: Patient Spontanous Breathing and Patient connected to nasal cannula oxygen  Post-op Assessment: Report given to RN and Post -op Vital signs reviewed and stable  Post vital signs: Reviewed and stable  Last Vitals:  Vitals Value Taken Time  BP 179/89 12/06/2017  5:37 PM  Temp    Pulse 103 12/06/2017  5:39 PM  Resp 15 12/06/2017  5:39 PM  SpO2 100 % 12/06/2017  5:39 PM  Vitals shown include unvalidated device data.  Last Pain:  Vitals:   12/06/17 0800  TempSrc: Oral  PainSc: 0-No pain      Patients Stated Pain Goal: 3 (67/20/94 7096)  Complications: No apparent anesthesia complications

## 2017-12-06 NOTE — H&P (Signed)
Interval H&P  Chief Complaint   Chief Complaint  Patient presents with  . Loss of Consciousness    HPI   HPI: Regina [name] is a 77 y.o. female who initially presented outpatient for gait instability and generalized weakness over the past 2 years.  In addition she was having difficulties with fine motor skills like buttoning buttons and with handwriting. She underwent MRI of her cervical spine which showed severe cervical spinal stenosis with myelopathy. She was scheduled for elective surgery, but unfortunately was admitted over the weekend due to a syncopal episode likely due to hyponatremia. She remained inpatient due to safety reasons at home. She has been given medical clearance to proceed with surgery today. She is without any concerns.   Patient Active Problem List   Diagnosis Date Noted  . Insulin resistance   . Hashimoto's disease   . Cervical spondylolysis   . Anxiety   . Hyponatremia 12/01/2017  . Hypothyroidism 12/01/2017    PMH: Past Medical History:  Diagnosis Date  . Anxiety   . Arthritis    neck  . Asthma   . Cervical spondylolysis   . Cervical spondylolysis   . Depression   . Hashimoto's disease   . Insulin resistance   . PONV (postoperative nausea and vomiting)    hasn't had surgery since 1993    PSH: Past Surgical History:  Procedure Laterality Date  . APPENDECTOMY    . BREAST SURGERY     left biopsy  . CESAREAN SECTION     x2  . CHOLECYSTECTOMY    . DENTAL SURGERY     teeth removal  . OVARY SURGERY     ovarian wedge  . TONSILLECTOMY      Medications Prior to Admission  Medication Sig Dispense Refill Last Dose  . acetaminophen (TYLENOL) 500 MG tablet Take 1,000 mg by mouth every 6 (six) hours as needed (for headaches.).   11/30/2017 at Unknown time  . albuterol (PROVENTIL HFA;VENTOLIN HFA) 108 (90 Base) MCG/ACT inhaler Inhale 1-2 puffs into the lungs every 6 (six) hours as needed for wheezing or shortness of breath  (asthma/coughing).   unk at prn  . ALPRAZolam (XANAX) 0.25 MG tablet Take 0.25 mg by mouth 3 (three) times daily.    12/01/2017 at Unknown time  . carbamide peroxide (DEBROX) 6.5 % OTIC solution Place 5 drops into both ears daily as needed (wax build up).   unk at prn  . CHERATUSSIN AC 100-10 MG/5ML syrup Take 5 mLs by mouth 4 (four) times daily. For 5 days.  0 Past Week at Unknown time  . diphenhydrAMINE (BENADRYL) 25 MG tablet Take 25 mg by mouth every 6 (six) hours as needed for allergies.    at prn  . escitalopram (LEXAPRO) 10 MG tablet Take 10 mg by mouth at bedtime.   11/30/2017 at Unknown time  . fexofenadine (ALLEGRA) 180 MG tablet Take 180 mg by mouth daily.   11/30/2017 at Unknown time  . ibuprofen (ADVIL,MOTRIN) 200 MG tablet Take 600 mg by mouth every 8 (eight) hours as needed (for pain).   11/30/2017 at Unknown time  . levothyroxine (SYNTHROID) 75 MCG tablet Take 75 mcg by mouth daily before breakfast.   11/30/2017 at Unknown time  . metaxalone (SKELAXIN) 800 MG tablet Take 800 mg by mouth 3 (three) times daily as needed for muscle spasms.   11/30/2017 at Unknown time  . metFORMIN (GLUCOPHAGE) 850 MG tablet Take 850 mg by mouth 2 (two) times daily.  11/30/2017 at Unknown time  . montelukast (SINGULAIR) 10 MG tablet Take 10 mg by mouth at bedtime.  3 11/30/2017 at Unknown time  . triamcinolone cream (KENALOG) 0.1 % Apply 1 application topically as needed (once a week).   unk at prn  . triamterene-hydrochlorothiazide (MAXZIDE-25) 37.5-25 MG tablet Take 1 tablet by mouth every morning.   11 11/30/2017 at Unknown time  . APPLE CIDER VINEGAR PO Take 1 capsule by mouth daily with lunch.   Not Taking at Unknown time  . aspirin EC 325 MG tablet Take 325 mg by mouth at bedtime.   Not Taking at Unknown time    SH: Social History   Tobacco Use  . Smoking status: Former Smoker    Last attempt to quit: 1982    Years since quitting: 37.7  . Smokeless tobacco: Never Used  Substance Use Topics  .  Alcohol use: Yes    Comment: very rare  . Drug use: Never    MEDS: Prior to Admission medications   Medication Sig Start Date End Date Taking? Authorizing Provider  acetaminophen (TYLENOL) 500 MG tablet Take 1,000 mg by mouth every 6 (six) hours as needed (for headaches.).   Yes [provider]  albuterol (PROVENTIL HFA;VENTOLIN HFA) 108 (90 Base) MCG/ACT inhaler Inhale 1-2 puffs into the lungs every 6 (six) hours as needed for wheezing or shortness of breath (asthma/coughing).   Yes [provider]  ALPRAZolam (XANAX) 0.25 MG tablet Take 0.25 mg by mouth 3 (three) times daily.    Yes [provider]  carbamide peroxide (DEBROX) 6.5 % OTIC solution Place 5 drops into both ears daily as needed (wax build up).   Yes [provider]  CHERATUSSIN AC 100-10 MG/5ML syrup Take 5 mLs by mouth 4 (four) times daily. For 5 days. 11/23/17  Yes [provider]  diphenhydrAMINE (BENADRYL) 25 MG tablet Take 25 mg by mouth every 6 (six) hours as needed for allergies.   Yes [provider]  escitalopram (LEXAPRO) 10 MG tablet Take 10 mg by mouth at bedtime.   Yes [provider]  fexofenadine (ALLEGRA) 180 MG tablet Take 180 mg by mouth daily.   Yes [provider]  ibuprofen (ADVIL,MOTRIN) 200 MG tablet Take 600 mg by mouth every 8 (eight) hours as needed (for pain).   Yes [provider]  levothyroxine (SYNTHROID) 75 MCG tablet Take 75 mcg by mouth daily before breakfast.   Yes [provider]  metaxalone (SKELAXIN) 800 MG tablet Take 800 mg by mouth 3 (three) times daily as needed for muscle spasms.   Yes [provider]  metFORMIN (GLUCOPHAGE) 850 MG tablet Take 850 mg by mouth 2 (two) times daily.    Yes [provider]  montelukast (SINGULAIR) 10 MG tablet Take 10 mg by mouth at bedtime. 09/07/17  Yes [provider]  triamcinolone cream (KENALOG) 0.1 % Apply 1 application topically as needed  (once a week).   Yes [provider]  triamterene-hydrochlorothiazide (MAXZIDE-25) 37.5-25 MG tablet Take 1 tablet by mouth every morning.  11/19/17  Yes [provider]  APPLE CIDER VINEGAR PO Take 1 capsule by mouth daily with lunch.    [provider]  aspirin EC 325 MG tablet Take 325 mg by mouth at bedtime.    [provider]    ALLERGY: Allergies  Allergen Reactions  . Ceclor [Cefaclor] Rash  . Naproxen Hives and Swelling    Mouth swelling  . Sulfa Antibiotics Nausea  And Vomiting and Other (See Comments)    Syncope/headaches   . Amlodipine Cough  . Augmentin [Amoxicillin-Pot Clavulanate] Nausea And Vomiting    Has patient had a PCN reaction causing immediate rash, facial/tongue/throat swelling, SOB or lightheadedness with hypotension: No Has patient had a PCN reaction causing severe rash involving mucus membranes or skin necrosis: No Has patient had a PCN reaction that required hospitalization: No Has patient had a PCN reaction occurring within the last 10 years: No If all of the above answers are "NO", then may proceed with Cephalosporin use.   . Other Nausea And Vomiting    "pain medications" or opoids/Anesthesia  . Valsartan Cough    Social History   Tobacco Use  . Smoking status: Former Smoker    Last attempt to quit: 1982    Years since quitting: 37.7  . Smokeless tobacco: Never Used  Substance Use Topics  . Alcohol use: Yes    Comment: very rare     History reviewed. No pertinent family history.   ROS   ROS  Exam   Vitals:   12/06/17 0648 12/06/17 0800  BP: (!) 188/79 (!) 162/77  Pulse:  61  Resp:    Temp:    SpO2:  100%   General appearance: WDWN, NAD Eyes: PERRL, Fundoscopic: normal Cardiovascular: Regular rate and rhythm without murmurs, rubs, gallops. No edema or variciosities. Distal pulses normal. Pulmonary: Clear to auscultation Musculoskeletal:     Muscle tone upper extremities: Normal    Muscle tone  lower extremities: Normal    Motor exam: Upper Extremities Deltoid Bicep Tricep Grip  Right 5/5 5/5 5/5 4/5  Left 5/5 5/5 5/5 4/5   Lower Extremity IP Quad PF DF EHL  Right 5/5 5/5 5/5 5/5 5/5  Left 5/5 5/5 5/5 5/5 5/5   Neurological Awake, alert, oriented Memory and concentration grossly intact Speech fluent, appropriate CNII: Visual fields normal CNIII/IV/VI: EOMI CNV: Facial sensation normal CNVII: Symmetric, normal strength CNVIII: Grossly normal CNIX: Normal palate movement CNXI: Trap and SCM strength normal CN XII: Tongue protrusion normal Sensation grossly intact to LT DTR: diffusely increased Coordination (finger/nose & heel/shin): Normal + hoffmans b/l  Results - Imaging/Labs   Results for orders placed or performed during the hospital encounter of 12/01/17 (from the past 48 hour(s))  Basic metabolic panel     Status: Abnormal   Collection Time: 12/05/17  3:47 AM  Result Value Ref Range   Sodium 134 (L) 135 - 145 mmol/L   Potassium 3.8 3.5 - 5.1 mmol/L   Chloride 99 98 - 111 mmol/L   CO2 26 22 - 32 mmol/L   Glucose, Bld 98 70 - 99 mg/dL   BUN 10 8 - 23 mg/dL   Creatinine, Ser 0.91 0.44 - 1.00 mg/dL   Calcium 8.8 (L) 8.9 - 10.3 mg/dL   GFR calc non Af Amer 59 (L) >60 mL/min   GFR calc Af Amer >60 >60 mL/min    Comment: (NOTE) The eGFR has been calculated using the CKD EPI equation. This calculation has not been validated in all clinical situations. eGFR's persistently <60 mL/min signify possible Chronic Kidney Disease.    Anion gap 9 5 - 15    Comment: Performed at [facility] [address], [place], [place] [address]  Magnesium     Status: None   Collection Time: 12/05/17  2:44 PM  Result Value Ref Range   Magnesium 1.9 1.7 - 2.4 mg/dL    Comment: Performed at  [facility], [place] [address], [place], [place] [address]  Osmolality     Status: Abnormal   Collection Time: 12/05/17  2:44 PM  Result Value Ref Range   Osmolality 272 (L) 275 -  295 mOsm/kg    Comment: Performed at [facility], [place] [address], [place], [place] [address]  Glucose, capillary     Status: Abnormal   Collection Time: 12/05/17  5:41 PM  Result Value Ref Range   Glucose-Capillary 127 (H) 70 - 99 mg/dL  Renal function panel     Status: Abnormal   Collection Time: 12/06/17  4:30 AM  Result Value Ref Range   Sodium 134 (L) 135 - 145 mmol/L   Potassium 4.8 3.5 - 5.1 mmol/L    Comment: DELTA CHECK NOTED   Chloride 100 98 - 111 mmol/L   CO2 27 22 - 32 mmol/L   Glucose, Bld 94 70 - 99 mg/dL   BUN 11 8 - 23 mg/dL   Creatinine, Ser 0.93 0.44 - 1.00 mg/dL   Calcium 8.7 (L) 8.9 - 10.3 mg/dL   Phosphorus 4.1 2.5 - 4.6 mg/dL   Albumin 3.1 (L) 3.5 - 5.0 g/dL   GFR calc non Af Amer 58 (L) >60 mL/min   GFR calc Af Amer >60 >60 mL/min    Comment: (NOTE) The eGFR has been calculated using the CKD EPI equation. This calculation has not been validated in all clinical situations. eGFR's persistently <60 mL/min signify possible Chronic Kidney Disease.    Anion gap 7 5 - 15    Comment: Performed at [facility] [address] [place], [place] [address]  Magnesium     Status: None   Collection Time: 12/06/17  4:30 AM  Result Value Ref Range   Magnesium 1.9 1.7 - 2.4 mg/dL    Comment: Performed at [facility] [address] [place], [place] [address]    No results found.  MRI of cervical spine dated 11/15/17 was reviewed. Primary finding is at C3-4, C4-5 and C5-6 where there is a broad based disc protusion with resultant severe canal stenosis. There is subtle T2 signal change within the spinal cord extending from C3-4 to C5-6.  Impression/Plan   77 y.o. 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 It was recommended that she undergo treatment in the form of ACDF at C3-4, C4-5 and C5-6.   While in the office risks, benefits and alternatives to procedure were discussed. Patient states in  own language understand and wishes to proceed. She has received medical clearance from recent syncopal episode. Will proceed today as planned.

## 2017-12-06 NOTE — Progress Notes (Signed)
[facility] PROGRESS NOTE  Regina [name] VOJ:[id number] DOB: 1940-10-17 DOA: 12/01/2017 PCP: Maurice Small, MD  Assessment/Plan: Hyponatremia -resolved 12/06/17 - likely related to  hydrochlorothiazide and lexapro, dehydration, severe pain in the neck.  -continue to hold HCTZ -slight drop on 9/3 with resumption of lexapro on 9/2 -serum osmolality 272 -urine sodium and urine osmolality pending -fluid restrict  Syncope:  -Suspect from hyponatremia, orthostatic hypotension and dehydration  -possible pain as well or weakness from neck -echo: - Left ventricle: The cavity size was normal. Wall thickness was normal. Systolic function was normal. The estimated ejection fraction was in the range of 60% to 65%. Wall motion was normal; there were no regional wall motion abnormalities. Left ventricular diastolic function parameters were normal. -etiology remains unclear, presumably related to volume depletion and orthostasis -surgery today, will monitor closely post op  Hypothyroidism: -tsh 0.895 -continue synthroid.  hypomagnesium -mag level 1.9 12/05/17 -Monitor and replace   Cervical spondylosis:  -surgery today -PT eval-- PT confirmed  that patient was at increased risk of falls due to weakness from cervical spondylosis -As per Neurosurgery team.  Code Status: full Family Communication: daughter at bedside Disposition Plan: home when ready   Consultants:  [facility] neurosurgery  Procedures:  ACDF 12/06/17  Antibiotics:  none  HPI/Subjective: Regina [name] a very pleasant 77 y.o.femalewith medical history significant ofcervical spondylosis, was seen by neuro surgery last week and was tentatively scheduled for surgery 9/5.  Patient was admitted 9/1 with syncope and hyponatremia.  According to the patient, the cervical spine is impinging on the spinal cause, leading to weakness of the hands and legs.  Hyponatremia has improved  significantly.  [facility] continue to work patient will follow with primary etiology.  No fever or chills, no chest pain, no shortness of breath, no other constitutional symptoms reported  Objective: Vitals:   12/06/17 0648 12/06/17 0800  BP: (!) 188/79   Pulse:    Resp:    Temp:    SpO2:  100%   No intake or output data in the 24 hours ending 12/06/17 0850 Filed Weights   12/01/17 1131 12/01/17 1133 12/01/17 1909  Weight: 71.7 kg 71.7 kg 72.8 kg    Exam:   General:  Eyes closed lying in bed. Arouses to verbal stimuli. No acute distress  Cardiovascular: RRR no mgr no LE edema  Respiratory: normal effort BS clear bilaterally no wheeze  Abdomen: obese, non-distended, soft +BS non tender to palpation  Musculoskeletal: joints without swelling/erythema   Data Reviewed: Basic Metabolic Panel: Recent Labs  Lab 12/02/17 0514 12/03/17 0424 12/04/17 0351 12/05/17 0347 12/05/17 1444 12/06/17 0430  NA 128* 134* 132* 134*  --  134*  K 3.5 3.9 3.9 3.8  --  4.8  CL 94* 99 100 99  --  100  CO2 24 27 25 26   --  27  GLUCOSE 132* 137* 122* 98  --  94  BUN 9 9 11 10   --  11  CREATININE 0.78 0.72 0.80 0.91  --  0.93  CALCIUM 8.8* 9.1 8.7* 8.8*  --  8.7*  MG  --   --  1.6*  --  1.9 1.9  PHOS  --   --   --   --   --  4.1   Liver Function Tests: Recent Labs  Lab 12/01/17 1202 12/06/17 0430  AST 17  --   ALT 12  --   ALKPHOS 59  --   BILITOT 0.7  --  PROT 6.2*  --   ALBUMIN 3.8 3.1*   No results for input(s): LIPASE, AMYLASE in the last 168 hours. No results for input(s): AMMONIA in the last 168 hours. CBC: Recent Labs  Lab 11/29/17 1337 12/01/17 1202 12/01/17 1209 12/04/17 0351  WBC 7.7 11.8*  --  11.9*  NEUTROABS  --  9.9*  --   --   HGB 12.8 12.9 13.6 11.6*  HCT 38.6 38.2 40.0 35.4*  MCV 86.4 84.7  --  85.5  PLT 344 314  --  358   Cardiac Enzymes: Recent Labs  Lab 12/01/17 1202  CKTOTAL 96   BNP (last 3 results) No results for input(s): BNP in  the last 8760 hours.  ProBNP (last 3 results) No results for input(s): PROBNP in the last 8760 hours.  CBG: Recent Labs  Lab 12/02/17 0909 12/02/17 1212 12/05/17 1741  GLUCAP 123* 141* 127*    Recent Results (from the past 240 hour(s))  Surgical pcr screen     Status: None   Collection Time: 11/29/17  1:37 PM  Result Value Ref Range Status   MRSA, PCR NEGATIVE NEGATIVE Final   Staphylococcus aureus NEGATIVE NEGATIVE Final    Comment: (NOTE) The Xpert SA Assay (FDA approved for NASAL specimens in patients 73 years of age and older), is one component of a comprehensive surveillance program. It is not intended to diagnose infection nor to guide or monitor treatment. Performed at [facility], [place] [address], [place],  [address]      Studies: No results found.  Scheduled Meds: . chlorhexidine   Topical Once  . enoxaparin (LOVENOX) injection  40 mg Subcutaneous Q24H  . escitalopram  10 mg Oral QHS  . fluticasone  1 spray Each Nare Daily  . hydrALAZINE  25 mg Oral Q8H  . levothyroxine  75 mcg Oral QAC breakfast  . montelukast  10 mg Oral QHS   Continuous Infusions:  Principal Problem:   Hyponatremia Active Problems:   Hypothyroidism   Insulin resistance   Hashimoto's disease   Cervical spondylolysis   Anxiety    Time spent: 65 minutes    [facility]  If 7PM-7AM, please contact night-coverage at [email or web address], password [facility] 12/06/2017, 8:50 AM  LOS: 0 days

## 2017-12-06 NOTE — Anesthesia Procedure Notes (Signed)
Procedure Name: Intubation Date/Time: 12/06/2017 1:40 PM Performed by: Imagene Riches, CRNA Pre-anesthesia Checklist: Patient identified, Emergency Drugs available, Suction available and Patient being monitored Patient Re-evaluated:Patient Re-evaluated prior to induction Oxygen Delivery Method: Circle System Utilized Preoxygenation: Pre-oxygenation with 100% oxygen Induction Type: IV induction Ventilation: Mask ventilation without difficulty Laryngoscope Size: Glidescope and 3 Grade View: Grade I Tube type: Oral Tube size: 7.0 mm Number of attempts: 1 Airway Equipment and Method: Stylet and Oral airway Placement Confirmation: ETT inserted through vocal cords under direct vision,  positive ETCO2 and breath sounds checked- equal and bilateral Secured at: 20 cm Tube secured with: Tape Dental Injury: Teeth and Oropharynx as per pre-operative assessment

## 2017-12-06 NOTE — Op Note (Signed)
NEUROSURGERY OPERATIVE NOTE   PREOP DIAGNOSIS: Cervical stenosis with myelopathy, C3-4, C4-5, C5-6  POSTOP DIAGNOSIS: Same  PROCEDURE: 1. Discectomy at C3-4, C4-5, C5-6 for decompression of spinal cord and exiting nerve roots  2. Placement of intervertebral biomechanical device, Medtronic PTC PEEK 80m lordotic cage x3 3. Placement of anterior instrumentation consisting of interbody plate and screws spanning C3-C6 - Medtronic Atlantis translation plate 4. Use of morselized bone allograft  5. Arthrodesis C3-4, C4-5, C5-6, anterior interbody technique  6. Use of intraoperative microscope  SURGEON: Dr. NConsuella Lose MD  ASSISTANT: Dr. GKary Kos MD  ANESTHESIA: General Endotracheal  EBL: 50cc  SPECIMENS: None  DRAINS: None  COMPLICATIONS: None immediate  CONDITION: Hemodynamically stable to PACU  HISTORY: Regina [name] is a 77y.o. woman initially followed in the outpatient neurosurgery clinic with progressive symptoms of myelopathy.  Her MRI did demonstrate significant cervical stenosis due primarily to broad-based disc protrusions at C3-4, C4-5, and C5-6.  She was therefore electively scheduled for surgery.  She presented to the hospital approximately 5 days prior to surgery after a syncopal episode likely related to hyponatremia.  She was medically stabilized, however preoperative therapy evaluations suggested she would be a good candidate for inpatient rehab given her myelopathy.  She was therefore kept as an inpatient and brought to the operating room for the planned surgery.  The risks and benefits of the surgery were explained in detail to the patient and her family previously in the office.  After all their questions were answered, informed consent was obtained and witnessed.  PROCEDURE IN DETAIL: The patient was brought to the operating room and transferred to the operative table. After induction of general anesthesia, the patient was positioned on the  operative table in the supine position with all pressure points meticulously padded. The skin of the neck was then prepped and draped in the usual sterile fashion.  After timeout was conducted, the skin was infiltrated with local anesthetic. Skin incision was then made sharply and Bovie electrocautery was used to dissect the subcutaneous tissue until the platysma was identified. The platysma was then divided and undermined. The sternocleidomastoid muscle was then identified and, utilizing natural fascial planes in the neck, the prevertebral fascia was identified and the carotid sheath was retracted laterally and the trachea and esophagus retracted medially.  There was a relatively large external jugular vein coursing across the operative field which required skeletonization and mobilization laterally.  Again using fluoroscopy, the correct disc spaces were identified. Bovie electrocautery was used to dissect in the subperiosteal plane and elevate the bilateral longus coli muscles. Table mounted retractors were then placed. At this point, the microscope was draped and brought into the field, and the remainder of the case was done under the microscope using microdissecting technique.  The C3-4 disc space was incised sharply and rongeurs were use to initially complete a discectomy. The high-speed drill was then used to complete discectomy until the posterior annulus was identified and removed and the posterior longitudinal ligament was identified. Using a nerve hook, the PLL was elevated, and Kerrison rongeurs were used to remove the posterior longitudinal ligament and the ventral thecal sac was identified. Using a combination of curettes and rongeurs, complete decompression of the thecal sac and exiting nerve roots at this level was completed, and verified using micro-nerve hook. The PLL was noted to be significantly hypertrophic, and partially calcified.  At this point, a 6 mm lordotic interbody cage was sized  and packed with morcellized bone allograft.  This was then inserted and tapped into place.  Attention was then turned to the C4-5 level.  There were significant bridging osteophytes across the disc space.  Once these were removed, the disc space was noted to be significantly collapsed.  I therefore elected to place a cast bar distractor within the C4 and C5 vertebral body in order to gain access to the disc space. Discectomy was completed initially with curettes and rongeurs, and completed with the drill. The PLL was again identified, elevated and incised. Using Kerrison rongeurs, decompression of the spinal cord and exiting roots was completed and confirmed with a dissector.  Similar to the C3-4 level, I did note significant thickening of the posterior longitudinal ligament, as well as partial calcification.  A 6 mm lordotic interbody cage was then sized and filled with bone allograft, and tapped into place.  Vertebral body distraction was then released, and the C4 cast bar pin was removed.  This pin was then replaced in the C6 vertebral body.  Attention was then turned to the C5-6 level.  Initially, large anterior osteophytes were drilled down, and the bridging osteophytes across the anterior portion of the disc space were removed.  Caspar distractor was then placed to allow access to the C5-6 disc space.  In a similar fashion, discectomy was completed initially with curettes and rongeurs, and completed with the drill. The PLL was again identified, elevated and incised.  The thecal sac and exiting nerve roots at C5-6 were then decompressed.  Similar to the above levels, the posterior longitudinal ligament was significantly thickened, although there was not a significant amount of calcification at this level.  A 6 mm lordotic interbody cage was then sized and filled with bone allograft, and tapped into place.  Caspari distraction was then released, and the pins were removed.  The pin holes were filled with  morselized Gelfoam with thrombin.  After placement of the intervertebral devices, the anterior cervical plate was selected, and placed across the interspaces. Using a high-speed drill, the cortex of the cervical vertebral bodies was punctured, and screws inserted in the C3, C4, C5, and C6 levels. Final fluoroscopic images in lateral projection was taken to confirm good hardware placement.  At this point, after all counts were verified to be correct, meticulous hemostasis was secured using a combination of bipolar electrocautery and passive hemostatics. The platysma muscle was then closed using interrupted 3-0 Vicryl sutures, and the skin was closed with a interrupted subcuticular stitch. Sterile dressings were then applied and the drapes removed.  The patient tolerated the procedure well and was extubated in the room and taken to the postanesthesia care unit in stable condition.

## 2017-12-07 ENCOUNTER — Encounter (HOSPITAL_COMMUNITY): Payer: Self-pay | Admitting: Neurosurgery

## 2017-12-07 DIAGNOSIS — M4302 Spondylolysis, cervical region: Secondary | ICD-10-CM

## 2017-12-07 LAB — COMPREHENSIVE METABOLIC PANEL
ALT: 13 U/L (ref 0–44)
AST: 16 U/L (ref 15–41)
Albumin: 2.9 g/dL — ABNORMAL LOW (ref 3.5–5.0)
Alkaline Phosphatase: 44 U/L (ref 38–126)
Anion gap: 9 (ref 5–15)
BUN: 9 mg/dL (ref 8–23)
CO2: 24 mmol/L (ref 22–32)
Calcium: 8.4 mg/dL — ABNORMAL LOW (ref 8.9–10.3)
Chloride: 97 mmol/L — ABNORMAL LOW (ref 98–111)
Creatinine, Ser: 0.82 mg/dL (ref 0.44–1.00)
GFR calc Af Amer: >60 mL/min (ref >60)
GFR calc non Af Amer: >60 mL/min (ref >60)
Glucose, Bld: 120 mg/dL — ABNORMAL HIGH (ref 70–99)
Potassium: 4 mmol/L (ref 3.5–5.1)
Sodium: 130 mmol/L — ABNORMAL LOW (ref 135–145)
Total Bilirubin: 0.8 mg/dL (ref 0.3–1.2)
Total Protein: 5 g/dL — ABNORMAL LOW (ref 6.5–8.1)

## 2017-12-07 LAB — CBC
HCT: 34.7 % — ABNORMAL LOW (ref 36.0–46.0)
Hemoglobin: 11.5 g/dL — ABNORMAL LOW (ref 12.0–15.0)
MCH: 28.5 pg (ref 26.0–34.0)
MCHC: 33.1 g/dL (ref 30.0–36.0)
MCV: 85.9 fL (ref 78.0–100.0)
Platelets: 348 10*3/uL (ref 150–400)
RBC: 4.04 MIL/uL (ref 3.87–5.11)
RDW: 14.8 % (ref 11.5–15.5)
WBC: 10.6 10*3/uL — ABNORMAL HIGH (ref 4.0–10.5)

## 2017-12-07 MED ORDER — PANTOPRAZOLE SODIUM 40 MG PO PACK
20.0000 mg | PACK | Freq: Every day | ORAL | Status: DC
  Administered 2017-12-07: 20 mg via ORAL
  Filled 2017-12-07 (×2): qty 20

## 2017-12-07 MED ORDER — DOCUSATE SODIUM 50 MG/5ML PO LIQD
100.0000 mg | Freq: Two times a day (BID) | ORAL | Status: DC
  Administered 2017-12-07 – 2017-12-10 (×7): 100 mg via ORAL
  Filled 2017-12-07 (×8): qty 10

## 2017-12-07 NOTE — Progress Notes (Signed)
Physical Therapy Treatment Patient Details Name: Regina [name] MRN: [id number] DOB: September 30, 1940 Today's Date: 12/07/2017    History of Present Illness Pt is a 77 y/o female admitted secondary to possible syncope. Pt found to have hyponatremia as well. 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 PMH includes hashimotos disease, asthma, and cervical spondylosis. Pt now s/p ANTERIOR CERVICAL DECOMPRESSION/DISCECTOMY FUSION 3 levels    PT Comments    Pt admitted with above diagnosis. Pt currently with functional limitations due to balance and endurance deficits. Pt was able to ambulate with RW with min assist and cues.  Right LE weakness noted and pt states it feels like it is "concrete".  Progressing slowly.  CIR appropriate for pt to reach maximal functional level.   Pt will benefit from skilled PT to increase their independence and safety with mobility to allow discharge to the venue listed below.     Follow Up Recommendations  CIR;Supervision/Assistance - 24 hour     Equipment Recommendations  Rolling walker with 5" wheels    Recommendations for Other Services OT consult     Precautions / Restrictions Precautions Precautions: Fall Restrictions Weight Bearing Restrictions: No    Mobility  Bed Mobility Overal bed mobility: Needs Assistance Bed Mobility: Sit to Sidelying         Sit to sidelying: Mod assist(A for legs) General bed mobility comments: pt in chair on arrival  Transfers Overall transfer level: Needs assistance Equipment used: Rolling walker (2 wheeled) Transfers: Sit to/from Stand Sit to Stand: Min assist         General transfer comment: min A to stand with cues. Steadying assist  thereafter  Ambulation/Gait Ambulation/Gait assistance: Min assist;+2 safety/equipment Gait Distance (Feet): 65 Feet(50 feet and then 15 feet) Assistive device: Rolling walker (2 wheeled) Gait Pattern/deviations:  Step-through pattern;Decreased step length - right;Decreased weight shift to right;Decreased dorsiflexion - right;Decreased stride length;Wide base of support;Antalgic   Gait velocity interpretation: <1.31 ft/sec, indicative of household ambulator General Gait Details: Pt was able to ambulate wtih RW with right foot decr stride length.  As pt fatigued, right foot more difficult to move per pt and pt states it feels like "concrete".  Followed with chair and pt needed seated rest break.   Did walk from door of room back to bed to rest at end of session.    Stairs             Wheelchair Mobility    Modified Rankin (Stroke Patients Only)       Balance Overall balance assessment: Needs assistance Sitting-balance support: No upper extremity supported;Feet supported Sitting balance-Leahy Scale: Fair     Standing balance support: Bilateral upper extremity supported Standing balance-Leahy Scale: Poor Standing balance comment: needed min assist to steady pt in standing with RW.                             Cognition Arousal/Alertness: Awake/alert Behavior During Therapy: WFL for tasks assessed/performed Overall Cognitive Status: Within Functional Limits for tasks assessed                                        Exercises      General Comments General comments (skin integrity, edema, etc.): husband present and attentive.      Pertinent Vitals/Pain Pain Assessment: Faces  Faces Pain Scale: Hurts a little bit Pain Location: neck Pain Descriptors / Indicators: Aching;Grimacing;Guarding Pain Intervention(s): Limited activity within patient's tolerance;Monitored during session;Premedicated before session;Repositioned    Home Living                      Prior Function            PT Goals (current goals can now be found in the care plan section) Acute Rehab PT Goals Patient Stated Goal: To be able to take care of myself again Progress  towards PT goals: Progressing toward goals    Frequency    Min 3X/week      PT Plan Current plan remains appropriate    Co-evaluation              AM-PAC PT "6 Clicks" Daily Activity  Outcome Measure  Difficulty turning over in bed (including adjusting bedclothes, sheets and blankets)?: A Little Difficulty moving from lying on back to sitting on the side of the bed? : Unable Difficulty sitting down on and standing up from a chair with arms (e.g., wheelchair, bedside commode, etc,.)?: A Little Help needed moving to and from a bed to chair (including a wheelchair)?: A Little Help needed walking in hospital room?: A Little Help needed climbing 3-5 steps with a railing? : A Lot 6 Click Score: 15    End of Session Equipment Utilized During Treatment: Gait belt Activity Tolerance: Patient limited by fatigue(dizziness ) Patient left: with call bell/phone within reach;with family/visitor present;in bed Nurse Communication: Mobility status PT Visit Diagnosis: Unsteadiness on feet (R26.81);Muscle weakness (generalized) (M62.81);History of falling (Z91.81)     Time: 1572-6203 PT Time Calculation (min) (ACUTE ONLY): 27 min  Charges:  $Gait Training: 23-37 mins                     [place] Pager:  [phone]  Office:  [place] 12/07/2017, 3:38 PM

## 2017-12-07 NOTE — Progress Notes (Signed)
Occupational Therapy Re-Evaluation Patient Details Name: Regina [name] MRN: [id number] DOB: 09/02/1940 Today's Date: 12/07/2017    History of Present Illness Pt is a 77 y/o female admitted secondary to possible syncope. Pt found to have hyponatremia as well. 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 PMH includes hashimotos disease, asthma, and cervical spondylosis. Pt now s/p ANTERIOR CERVICAL DECOMPRESSION/DISCECTOMY FUSION 3 levels   Clinical Impression   Pt seen for OT re-evaluation following surgery. Pt remains with deficits in sensation, strength, gross and FMC of BUE and impaired balance impacting level of assist with ADLs. Pt does report she is now able to hold a light cup and bring to mouth which is an improvement since surgery. Pt remains very motivated to participate in therapy and eager to return home and to PLOF.     Follow Up Recommendations  CIR;Supervision/Assistance - 24 hour    Equipment Recommendations  Other (comment)(TBD at next venue)    Recommendations for Other Services       Precautions / Restrictions Precautions Precautions: Fall Restrictions Weight Bearing Restrictions: No      Mobility Bed Mobility               General bed mobility comments: pt in chair  Transfers Overall transfer level: Needs assistance Equipment used: Rolling walker (2 wheeled) Transfers: Sit to/from Stand Sit to Stand: Min assist;Mod assist         General transfer comment: mod A on initial stand, min guard thereafter    Balance Overall balance assessment: Needs assistance Sitting-balance support: No upper extremity supported;Feet supported Sitting balance-Leahy Scale: Fair     Standing balance support: Bilateral upper extremity supported Standing balance-Leahy Scale: Poor Standing balance comment: able to stand at sink with min guard assist                           ADL either performed  or assessed with clinical judgement   ADL Overall ADL's : Needs assistance/impaired Eating/Feeding: Sitting;Set up;With adaptive utensils;Minimal assistance Eating/Feeding Details (indicate cue type and reason): pt reports difficulty grasping red tubing on fork. able to hold cup and drink from straw Grooming: Moderate assistance;Standing Grooming Details (indicate cue type and reason): min guard for balance. assist due to decreased [facility] and gross motor Upper Body Bathing: Moderate assistance;Sitting   Lower Body Bathing: Sit to/from stand;Maximal assistance   Upper Body Dressing : Moderate assistance;Sitting   Lower Body Dressing: Maximal assistance;Sit to/from stand   Toilet Transfer: Min guard;Ambulation;RW   Toileting- Clothing Manipulation and Hygiene: Minimal assistance;Sit to/from stand       Functional mobility during ADLs: Min guard;Rolling walker General ADL Comments: Close min guard for functional mobility. Completed toilet transfer and grooming task. Assist to powerup at times during transfers and assist for gross and [facility] tasks.     Vision         Perception     Praxis      Pertinent Vitals/Pain Pain Assessment: Faces Faces Pain Scale: Hurts a little bit Pain Location: neck Pain Intervention(s): Monitored during session     Hand Dominance Right   Extremity/Trunk Assessment Upper Extremity Assessment Upper Extremity Assessment: RUE deficits/detail;LUE deficits/detail RUE Deficits / Details: 3/5 elbow and distal, 2+/5 at shoulder level. Pt reports being able to hold and drink from lidded cup with straw which she was not able to do just prior to surgery. Struggled with reaching soap and paper  towel dispensers. Using whole hand movement to turn faucet on and off.  RUE Sensation: decreased light touch RUE Coordination: decreased fine motor;decreased gross motor LUE Deficits / Details: 3/5 elbow and distal, 2+/5 at shoulder level. Struggled with reaching soap and  paper towel dispensers. Using whole hand movement to turn faucet on and off.  LUE Sensation: decreased light touch LUE Coordination: decreased fine motor;decreased gross motor   Lower Extremity Assessment Lower Extremity Assessment: Defer to PT evaluation   Cervical / Trunk Assessment Cervical / Trunk Assessment: Other exceptions Cervical / Trunk Exceptions: cervical spondylosis    Communication Communication Communication: No difficulties   Cognition Arousal/Alertness: Awake/alert Behavior During Therapy: WFL for tasks assessed/performed Overall Cognitive Status: Within Functional Limits for tasks assessed                                     General Comments       Exercises     Shoulder Instructions      Home Living Family/patient expects to be discharged to:: Inpatient rehab Living Arrangements: Spouse/significant other;Children Available Help at Discharge: Family;Available 24 hours/day Type of Home: House Home Access: Stairs to enter [employer] of Steps: threshold Entrance Stairs-Rails: None Home Layout: One level     Bathroom Shower/Tub: Occupational psychologist: Standard     Home Equipment: None   Additional Comments: Wife is caregiver for husband who has had stroke. Mostly cognitive side and no physical assist.       Prior Functioning/Environment Level of Independence: Independent                 OT Problem List: Decreased strength;Decreased range of motion;Impaired balance (sitting and/or standing);Impaired sensation;Impaired UE functional use;Decreased knowledge of use of DME or AE;Decreased knowledge of precautions;Decreased coordination      OT Treatment/Interventions: Self-care/ADL training;Balance training;DME and/or AE instruction;Patient/family education;Therapeutic activities;Therapeutic exercise    OT Goals(Current goals can be found in the care plan section) Acute Rehab OT Goals Patient Stated Goal: To  be able to take care of myself again OT Goal Formulation: With patient Time For Goal Achievement: 12/17/17 Potential to Achieve Goals: Good  OT Frequency: Min 3X/week   Barriers to D/C:            Co-evaluation              AM-PAC PT "6 Clicks" Daily Activity     Outcome Measure Help from another person eating meals?: A Little Help from another person taking care of personal grooming?: A Little Help from another person toileting, which includes using toliet, bedpan, or urinal?: A Lot Help from another person bathing (including washing, rinsing, drying)?: A Little Help from another person to put on and taking off regular upper body clothing?: A Lot Help from another person to put on and taking off regular lower body clothing?: Total 6 Click Score: 14   End of Session Equipment Utilized During Treatment: Rolling walker;Gait belt  Activity Tolerance: Patient tolerated treatment well Patient left: in chair;with call bell/phone within reach  OT Visit Diagnosis: Other abnormalities of gait and mobility (R26.89);Unsteadiness on feet (R26.81);History of falling (Z91.81);Muscle weakness (generalized) (M62.81)                Time: 4580-9983 OT Time Calculation (min): 25 min Charges:  OT General Charges $OT Visit: 1 Visit OT Evaluation $OT Re-eval: 1 Re-eval OT Treatments $Self Care/Home Management :  8-22 mins    Hortencia Pilar 12/07/2017, 11:55 AM

## 2017-12-07 NOTE — Progress Notes (Signed)
Inpatient Rehabilitation Admissions Coordinator  I met with patient and her spouse at bedside. We discussed goals and expectations of an inpt rehab admit. They are in agreement. I am hopeful for a bed available Monday. I will follow up on Monday.  Barbara Boyette, RN, MSN Rehab Admissions Coordinator [phone] 12/07/2017 3:21 PM  

## 2017-12-07 NOTE — Consult Note (Signed)
Physical Medicine and Rehabilitation Consult Reason for Consult: Decreased functional mobility Referring Physician: Internal medicine   HPI: Regina [name] is a 77 y.o. right-handed female with history of Hashimoto's disease, diabetes mellitus.  Per chart review patient lives with spouse reported to be independent prior to admission.  One level home.  Husband with history of CVA and limited physically.  Noted recent hospital admission for syncope and hyponatremia.  Presented 12/01/2017 with history of cervical stenosis and myelopathy followed outpatient by neurosurgery.  Recent MRI and imaging revealed broad-based disc protrusions C3-4, C4-5 and C5-6 with cervical stenosis and myelopathy.  Underwent discectomy C3-4, 4-5 and 5-6 for decompression of spinal cord and existing nerve roots 12/06/2017 per Dr. Kathyrn Sheriff.  Hospital course pain management-cervical collar.  Hospital course pain management.  Sodium stable 130.  Therapy evaluations completed with recommendations of physical medicine rehab consult.   Review of Systems  Constitutional: Negative for chills and fever.  HENT: Negative for hearing loss.   Eyes: Negative for blurred vision and double vision.  Respiratory: Negative for cough and shortness of breath.   Cardiovascular: Negative for chest pain, palpitations and leg swelling.  Gastrointestinal: Positive for constipation. Negative for nausea and vomiting.  Genitourinary: Negative for dysuria, flank pain and hematuria.  Musculoskeletal: Positive for joint pain and myalgias.  Skin: Negative for rash.  Neurological: Positive for tingling and weakness.  Psychiatric/Behavioral: Positive for depression.       Anxiety  All other systems reviewed and are negative.  Past Medical History:  Diagnosis Date  . Anxiety   . Arthritis    neck  . Asthma   . Cervical spondylolysis   . Cervical spondylolysis   . Depression   . Hashimoto's disease   . Insulin resistance   .  PONV (postoperative nausea and vomiting)    hasn't had surgery since 1993   Past Surgical History:  Procedure Laterality Date  . APPENDECTOMY    . BREAST SURGERY     left biopsy  . CESAREAN SECTION     x2  . CHOLECYSTECTOMY    . DENTAL SURGERY     teeth removal  . OVARY SURGERY     ovarian wedge  . TONSILLECTOMY     History reviewed. No pertinent family history. Social History:  reports that she quit smoking about 37 years ago. She has never used smokeless tobacco. She reports that she drinks alcohol. She reports that she does not use drugs. Allergies:  Allergies  Allergen Reactions  . Ceclor [Cefaclor] Rash  . Naproxen Hives and Swelling    Mouth swelling  . Sulfa Antibiotics Nausea And Vomiting and Other (See Comments)    Syncope/headaches   . Amlodipine Cough  . Augmentin [Amoxicillin-Pot Clavulanate] Nausea And Vomiting    Has patient had a PCN reaction causing immediate rash, facial/tongue/throat swelling, SOB or lightheadedness with hypotension: No Has patient had a PCN reaction causing severe rash involving mucus membranes or skin necrosis: No Has patient had a PCN reaction that required hospitalization: No Has patient had a PCN reaction occurring within the last 10 years: No If all of the above answers are "NO", then may proceed with Cephalosporin use.   . Other Nausea And Vomiting    "pain medications" or opoids/Anesthesia  . Valsartan Cough   Medications Prior to Admission  Medication Sig Dispense Refill  . acetaminophen (TYLENOL) 500 MG tablet Take 1,000 mg by mouth every 6 (six) hours as needed (for headaches.).    Marland Kitchen  albuterol (PROVENTIL HFA;VENTOLIN HFA) 108 (90 Base) MCG/ACT inhaler Inhale 1-2 puffs into the lungs every 6 (six) hours as needed for wheezing or shortness of breath (asthma/coughing).    . ALPRAZolam (XANAX) 0.25 MG tablet Take 0.25 mg by mouth 3 (three) times daily.     . carbamide peroxide (DEBROX) 6.5 % OTIC solution Place 5 drops into both  ears daily as needed (wax build up).    . CHERATUSSIN AC 100-10 MG/5ML syrup Take 5 mLs by mouth 4 (four) times daily. For 5 days.  0  . diphenhydrAMINE (BENADRYL) 25 MG tablet Take 25 mg by mouth every 6 (six) hours as needed for allergies.    Marland Kitchen escitalopram (LEXAPRO) 10 MG tablet Take 10 mg by mouth at bedtime.    . fexofenadine (ALLEGRA) 180 MG tablet Take 180 mg by mouth daily.    Marland Kitchen ibuprofen (ADVIL,MOTRIN) 200 MG tablet Take 600 mg by mouth every 8 (eight) hours as needed (for pain).    Marland Kitchen levothyroxine (SYNTHROID) 75 MCG tablet Take 75 mcg by mouth daily before breakfast.    . metaxalone (SKELAXIN) 800 MG tablet Take 800 mg by mouth 3 (three) times daily as needed for muscle spasms.    . metFORMIN (GLUCOPHAGE) 850 MG tablet Take 850 mg by mouth 2 (two) times daily.     . montelukast (SINGULAIR) 10 MG tablet Take 10 mg by mouth at bedtime.  3  . triamcinolone cream (KENALOG) 0.1 % Apply 1 application topically as needed (once a week).    . triamterene-hydrochlorothiazide (MAXZIDE-25) 37.5-25 MG tablet Take 1 tablet by mouth every morning.   11  . APPLE CIDER VINEGAR PO Take 1 capsule by mouth daily with lunch.    Marland Kitchen aspirin EC 325 MG tablet Take 325 mg by mouth at bedtime.      Home: Home Living Family/patient expects to be discharged to:: Inpatient rehab Living Arrangements: Spouse/significant other, Children Available Help at Discharge: Family, Available 24 hours/day Type of Home: House Home Access: Stairs to enter [employer] of Steps: threshold Entrance Stairs-Rails: None Home Layout: One level Bathroom Shower/Tub: Multimedia programmer: Standard Home Equipment: None Additional Comments: Wife is caregiver for husband who has had stroke. Mostly cognitive side and no physical assist.   Functional History: Prior Function Level of Independence: Independent Functional Status:  Mobility: Bed Mobility Overal bed mobility: Needs Assistance Bed Mobility:  Rolling, Sidelying to Sit, Sit to Sidelying Rolling: Min assist(VCs for sequencing) Sidelying to sit: Mod assist(HOB flat and use of rail, A for legs and up with trunk) Supine to sit: Mod assist Sit to supine: Min assist Sit to sidelying: Mod assist(A for legs) General bed mobility comments: pt in chair Transfers Overall transfer level: Needs assistance Equipment used: Rolling walker (2 wheeled) Transfers: Sit to/from Stand Sit to Stand: Min assist, Mod assist General transfer comment: mod A on initial stand, min guard thereafter Ambulation/Gait General Gait Details: Deferred     ADL: ADL Overall ADL's : Needs assistance/impaired Eating/Feeding: Sitting, Set up, With adaptive utensils, Minimal assistance Eating/Feeding Details (indicate cue type and reason): pt reports difficulty grasping red tubing on fork. able to hold cup and drink from straw Grooming: Moderate assistance, Standing Grooming Details (indicate cue type and reason): min guard for balance. assist due to decreased [facility] and gross motor Upper Body Bathing: Moderate assistance, Sitting Lower Body Bathing: Sit to/from stand, Maximal assistance Upper Body Dressing : Moderate assistance, Sitting Upper Body Dressing Details (indicate cue type and reason): putting  on gown as a robe Lower Body Dressing: Maximal assistance, Sit to/from stand Lower Body Dressing Details (indicate cue type and reason): Mod A sit<>stand Toilet Transfer: Min guard, Ambulation, RW Toilet Transfer Details (indicate cue type and reason): side step 3 steps up towards [place] with Bil HHA Toileting- Clothing Manipulation and Hygiene: Minimal assistance, Sit to/from stand Toileting - Clothing Manipulation Details (indicate cue type and reason): Mod A sit<>stand Functional mobility during ADLs: Min guard, Rolling walker General ADL Comments: Close min guard for functional mobility. Completed toilet transfer and grooming task. Assist to powerup at times during  transfers and assist for gross and [facility] tasks.  Cognition: Cognition Overall Cognitive Status: Within Functional Limits for tasks assessed Orientation Level: Oriented X4 Cognition Arousal/Alertness: Awake/alert Behavior During Therapy: WFL for tasks assessed/performed Overall Cognitive Status: Within Functional Limits for tasks assessed  Blood pressure (!) 116/46, pulse 67, temperature 98.3 F (36.8 C), temperature source Oral, resp. rate 13, height 5\' 2"  (1.575 m), weight 72.8 kg, SpO2 97 %. Physical Exam  Vitals reviewed. Constitutional: She is oriented to person, place, and time. She appears well-developed. No distress.  HENT:  Head: Normocephalic and atraumatic.  Eyes: EOM are normal.  Neck:  c- collar  Cardiovascular: Normal rate.  Respiratory: Effort normal. No respiratory distress.  GI: Soft. She exhibits no distension.  Neurological: She is alert and oriented to person, place, and time. She exhibits normal muscle tone.  UE 3+ to 4/5. Decreased FMC of fingers/hands. LE: 3+/5 HF, 4/5 KE and ADF/PF. Senses pain and LT for the most part with some decrease in fingers.   Skin: Skin is warm.  Psychiatric: She has a normal mood and affect. Her behavior is normal. Judgment and thought content normal.    Results for orders placed or performed during the hospital encounter of 12/01/17 (from the past 24 hour(s))  Comprehensive metabolic panel     Status: Abnormal   Collection Time: 12/07/17  3:38 AM  Result Value Ref Range   Sodium 130 (L) 135 - 145 mmol/L   Potassium 4.0 3.5 - 5.1 mmol/L   Chloride 97 (L) 98 - 111 mmol/L   CO2 24 22 - 32 mmol/L   Glucose, Bld 120 (H) 70 - 99 mg/dL   BUN 9 8 - 23 mg/dL   Creatinine, Ser 0.82 0.44 - 1.00 mg/dL   Calcium 8.4 (L) 8.9 - 10.3 mg/dL   Total Protein 5.0 (L) 6.5 - 8.1 g/dL   Albumin 2.9 (L) 3.5 - 5.0 g/dL   AST 16 15 - 41 U/L   ALT 13 0 - 44 U/L   Alkaline Phosphatase 44 38 - 126 U/L   Total Bilirubin 0.8 0.3 - 1.2 mg/dL   GFR calc  non Af Amer >60 >60 mL/min   GFR calc Af Amer >60 >60 mL/min   Anion gap 9 5 - 15  CBC     Status: Abnormal   Collection Time: 12/07/17  3:38 AM  Result Value Ref Range   WBC 10.6 (H) 4.0 - 10.5 K/uL   RBC 4.04 3.87 - 5.11 MIL/uL   Hemoglobin 11.5 (L) 12.0 - 15.0 g/dL   HCT 34.7 (L) 36.0 - 46.0 %   MCV 85.9 78.0 - 100.0 fL   MCH 28.5 26.0 - 34.0 pg   MCHC 33.1 30.0 - 36.0 g/dL   RDW 14.8 11.5 - 15.5 %   Platelets 348 150 - 400 K/uL   Dg Cervical Spine 1 View  Result Date: 12/06/2017 CLINICAL  DATA:  ACDF of C3-4, C4-5, C5-6.   OR-19. EXAM: DG CERVICAL SPINE - 1 VIEW; DG C-ARM 61-120 MIN COMPARISON:  None. FINDINGS: Single lateral intraoperative fluoroscopic spot image of the cervical spine is provided. Anterior cervical fusion hardware appears appropriately positioned at the C3 through C6 levels, with intervening disc grafts. Hardware appears intact. Fluoroscopy was provided for 3 seconds. IMPRESSION: Intraoperative fluoroscopic spot image of the cervical spine demonstrating anterior cervical fusion hardware at the C3 through C6 levels. No evidence of surgical complicating feature. Electronically Signed   By: Franki Cabot M.D.   On: 12/06/2017 17:30   Dg C-arm 1-60 Min  Result Date: 12/06/2017 CLINICAL DATA:  ACDF of C3-4, C4-5, C5-6.   OR-19. EXAM: DG CERVICAL SPINE - 1 VIEW; DG C-ARM 61-120 MIN COMPARISON:  None. FINDINGS: Single lateral intraoperative fluoroscopic spot image of the cervical spine is provided. Anterior cervical fusion hardware appears appropriately positioned at the C3 through C6 levels, with intervening disc grafts. Hardware appears intact. Fluoroscopy was provided for 3 seconds. IMPRESSION: Intraoperative fluoroscopic spot image of the cervical spine demonstrating anterior cervical fusion hardware at the C3 through C6 levels. No evidence of surgical complicating feature. Electronically Signed   By: Franki Cabot M.D.   On: 12/06/2017 17:30      Assessment/Plan: Diagnosis: Cervical stenosis/spondylosis with myelopathy 1. Does the need for close, 24 hr/day medical supervision in concert with the patient's rehab needs make it unreasonable for this patient to be served in a less intensive setting? Yes 2. Co-Morbidities requiring supervision/potential complications: hyponatremia, dysphagia, pain mgt, post-op considerations 3. Due to bladder management, bowel management, safety, skin/wound care, disease management, medication administration, pain management and patient education, does the patient require 24 hr/day rehab nursing? Yes 4. Does the patient require coordinated care of a physician, rehab nurse, PT (1-2 hrs/day, 5 days/week) and OT (1-2 hrs/day, 5 days/week) to address physical and functional deficits in the context of the above medical diagnosis(es)? Yes Addressing deficits in the following areas: balance, endurance, locomotion, strength, transferring, bowel/bladder control, bathing, dressing, feeding, grooming, toileting, psychosocial support and potentially swallowing 5. Can the patient actively participate in an intensive therapy program of at least 3 hrs of therapy per day at least 5 days per week? Yes 6. The potential for patient to make measurable gains while on inpatient rehab is excellent 7. Anticipated functional outcomes upon discharge from inpatient rehab are modified independent  with PT, modified independent with OT, modified independent with SLP. 8. Estimated rehab length of stay to reach the above functional goals is: 7-10 days 9. Anticipated D/C setting: Home 10. Anticipated post D/C treatments: HH therapy and Outpatient therapy 11. Overall Rehab/Functional Prognosis: excellent  RECOMMENDATIONS: This patient's condition is appropriate for continued rehabilitative care in the following setting: CIR Patient has agreed to participate in recommended program. Yes Note that insurance prior authorization may be  required for reimbursement for recommended care.  Comment: Rehab Admissions Coordinator to follow up.  Thanks,  Meredith Staggers, MD, Mellody Drown  I have personally performed a face to face diagnostic evaluation of this patient. Additionally, I have reviewed and concur with the physician assistant's documentation above.    Lavon Paganini Angiulli, PA-C 12/07/2017

## 2017-12-07 NOTE — Progress Notes (Signed)
Pt c/o right thigh feeling heavy. She stated it feels like lead when she tries to move it, otherwise does not c/o pain, numbness or tingling to the area. Will continue to monitor for improvement and/or worsening.

## 2017-12-07 NOTE — Plan of Care (Signed)

## 2017-12-07 NOTE — Progress Notes (Addendum)
  NEUROSURGERY PROGRESS NOTE   No issues overnight. Pt has appropriate neck soreness, reports subjective improvement in BUE coordination.  EXAM:  BP (!) 158/52 (BP Location: Left Arm)   Pulse 80   Temp 99.7 F (37.6 C) (Oral)   Resp 20   Ht 5\' 2"  (1.575 m)   Wt 72.8 kg   SpO2 98%   BMI 29.35 kg/m   Awake, alert, oriented  Speech fluent, appropriate  CN grossly intact  5/5 BUE/BLE, 4/5 bilateral grip Wound c/d/i, soft  IMPRESSION:  77 y.o. female POD#1 s/p C34, C45, C56 ACDF, doing well  PLAN: - Cont to mobilize - Was seen preop by PT/OT, will likely be good CIR candidate

## 2017-12-07 NOTE — Progress Notes (Signed)
PROGRESS NOTE    Regina [name]  DPO:[id number] DOB: 04/03/1941 DOA: 12/01/2017 PCP: Maurice Small, MD    Brief Narrative:  77 year old with past medical history relevant for hypothyroidism, hypertension, anxiety/depression, type 2 diabetes on oral hypoglycemics, sniffing and cervical spondylolysis pending neurosurgical evaluation who was admitted with syncope and significant hyponatremia.   Assessment & Plan:   Principal Problem:   Hyponatremia Active Problems:   Hypothyroidism   Insulin resistance   Hashimoto's disease   Cervical spondylolysis   Anxiety   #) Syncope secondary to hyponatremia: Suspect there is a degree of pain, medications, dehydration that contributed to this.  She has had no evidence of arrhythmias and her echo is reassuring.  Her hyponatremia has been somewhat difficult to manage.  Currently her HCTZ is being held however her S-Citalopram was restarted.  Her TSH is normal.  There is no evidence of adrenal insufficiency.  It is clearly hypoosmolar based on the serum osmolality.  It is responded somewhat to IV fluids. -Monitor, patient is not particularly symptomatic -Discontinue IV fluids - Echo on 12/02/2017 was unremarkable  #) Cervical spondylolysis with myelopathy: Patient is status post cervical spine surgery on 12/06/2017.  Patient appears to recovering some increasing bilateral upper extremity strength. -Physical therapy is recommending [facility] - Continue pain control -Hold aspirin 325  #) Hypertension: - Hold full dose aspirin - Hold HCTZ and triamterene  #) Hypothyroidism: -Continue levothyroxine 75 mcg  #) Type 2 diabetes: -Hold home metformin 850 mg twice daily -Sliding scale insulin, AC at bedtime  #)  pain/psych: -Continue escitalopram 10 mg nightly  #) Asthma/COPD: -Due PRN albuterol -Continue montelukast 10 mg nightly  Fluids: Tolerating p.o. Electrodes: Monitor and supplement Nutrition: Regular  Prophylaxis:  SCDs  Disposition: Pending discussion with [facility] inpatient rehab  Full code  Consultants:   Neurosurgery  [facility]  Procedures:  Echo 12/02/17: - Left ventricle: The cavity size was normal. Wall thickness was   normal. Systolic function was normal. The estimated ejection   fraction was in the range of 60% to 65%. Wall motion was normal;   there were no regional wall motion abnormalities. Left   ventricular diastolic function parameters were normal. - Aortic valve: Mildly calcified annulus. Trileaflet; mildly   calcified leaflets. - Mitral valve: Mildly thickened leaflets. There was moderate   regurgitation. - Left atrium: The atrium was at the upper limits of normal in   size. - Right atrium: Central venous pressure (est): 8 mm Hg. - Atrial septum: No defect or patent foramen ovale was identified. - Tricuspid valve: There was mild-moderate regurgitation. - Pulmonary arteries: PA peak pressure: 41 mm Hg (S).  - Pericardium, extracardiac: There was no pericardial effusion.  12/05/17 NSU surgery: PROCEDURE: 1. Discectomy at C3-4, C4-5, C5-6 for decompression of spinal cord and exiting nerve roots  2. Placement of intervertebral biomechanical device, Medtronic PTC PEEK 60m lordotic cage x3 3. Placement of anterior instrumentation consisting of interbody plate and screws spanning C3-C6 - Medtronic Atlantis translation plate 4. Use of morselized bone allograft  5. Arthrodesis C3-4, C4-5, C5-6, anterior interbody technique  6. Use of intraoperative microscope   Antimicrobials:  None   Subjective: Patient reports she is doing fairly well after the surgery.  She does not have any complaints at this time.  Her pain is well controlled.  She reports improving upper extremity strength however reports that her sensation continues to be diminished.  Objective: Vitals:   12/07/17 0431509/06/19 0400809/06/19 0676109/06/19  1151  BP: (!) 138/51 (!) 138/51 120/68 (!) 116/46    Pulse: 72  74 67  Resp:      Temp:   98.5 F (36.9 C) 98.3 F (36.8 C)  TempSrc:   Oral Oral  SpO2: 99%  97% 97%  Weight:      Height:        Intake/Output Summary (Last 24 hours) at 12/07/2017 1222 Last data filed at 12/07/2017 0500 Gross per 24 hour  Intake 1670 ml  Output 1350 ml  Net 320 ml   Filed Weights   12/01/17 1131 12/01/17 1133 12/01/17 1909  Weight: 71.7 kg 71.7 kg 72.8 kg    Examination:  General exam: Appears calm and comfortable, currently in neck brace Respiratory system: Clear to auscultation. Respiratory effort normal. Cardiovascular system: Regular rate and rhythm, no murmurs Gastrointestinal system: Abdomen is nondistended, soft and nontender. No organomegaly or masses felt. Normal bowel sounds heard. Central nervous system: Alert and oriented.  Bilateral upper extremity strength is 3-4 out of 5, diminished sensation in upper extremity, bilateral lower extremity strength is 5 out of 5, intact sensation Extremities: Lower extremity edema Skin: Incision site is covered Psychiatry: Judgement and insight appear normal. Mood & affect appropriate.     Data Reviewed: I have personally reviewed following labs and imaging studies  CBC: Recent Labs  Lab 12/01/17 1202 12/01/17 1209 12/04/17 0351 12/07/17 0338  WBC 11.8*  --  11.9* 10.6*  NEUTROABS 9.9*  --   --   --   HGB 12.9 13.6 11.6* 11.5*  HCT 38.2 40.0 35.4* 34.7*  MCV 84.7  --  85.5 85.9  PLT 314  --  358 683   Basic Metabolic Panel: Recent Labs  Lab 12/03/17 0424 12/04/17 0351 12/05/17 0347 12/05/17 1444 12/06/17 0430 12/07/17 0338  NA 134* 132* 134*  --  134* 130*  K 3.9 3.9 3.8  --  4.8 4.0  CL 99 100 99  --  100 97*  CO2 '27 25 26  ' --  27 24  GLUCOSE 137* 122* 98  --  94 120*  BUN '9 11 10  ' --  11 9  CREATININE 0.72 0.80 0.91  --  0.93 0.82  CALCIUM 9.1 8.7* 8.8*  --  8.7* 8.4*  MG  --  1.6*  --  1.9 1.9  --   PHOS  --   --   --   --  4.1  --    GFR: Estimated Creatinine  Clearance: 53.7 mL/min (by C-G formula based on SCr of 0.82 mg/dL). Liver Function Tests: Recent Labs  Lab 12/01/17 1202 12/06/17 0430 12/07/17 0338  AST 17  --  16  ALT 12  --  13  ALKPHOS 59  --  44  BILITOT 0.7  --  0.8  PROT 6.2*  --  5.0*  ALBUMIN 3.8 3.1* 2.9*   No results for input(s): LIPASE, AMYLASE in the last 168 hours. No results for input(s): AMMONIA in the last 168 hours. Coagulation Profile: No results for input(s): INR, PROTIME in the last 168 hours. Cardiac Enzymes: Recent Labs  Lab 12/01/17 1202  CKTOTAL 96   BNP (last 3 results) No results for input(s): PROBNP in the last 8760 hours. HbA1C: No results for input(s): HGBA1C in the last 72 hours. CBG: Recent Labs  Lab 12/02/17 0909 12/02/17 1212 12/05/17 1741  GLUCAP 123* 141* 127*   Lipid Profile: No results for input(s): CHOL, HDL, LDLCALC, TRIG, CHOLHDL, LDLDIRECT in the last  72 hours. Thyroid Function Tests: No results for input(s): TSH, T4TOTAL, FREET4, T3FREE, THYROIDAB in the last 72 hours. Anemia Panel: No results for input(s): VITAMINB12, FOLATE, FERRITIN, TIBC, IRON, RETICCTPCT in the last 72 hours. Sepsis Labs: No results for input(s): PROCALCITON, LATICACIDVEN in the last 168 hours.  Recent Results (from the past 240 hour(s))  Surgical pcr screen     Status: None   Collection Time: 11/29/17  1:37 PM  Result Value Ref Range Status   MRSA, PCR NEGATIVE NEGATIVE Final   Staphylococcus aureus NEGATIVE NEGATIVE Final    Comment: (NOTE) The Xpert SA Assay (FDA approved for NASAL specimens in patients 60 years of age and older), is one component of a comprehensive surveillance program. It is not intended to diagnose infection nor to guide or monitor treatment. Performed at [facility], [place] [address], [place], [place] [address]   Surgical PCR screen     Status: None   Collection Time: 12/06/17 12:42 PM  Result Value Ref Range Status   MRSA, PCR NEGATIVE NEGATIVE Final    Staphylococcus aureus NEGATIVE NEGATIVE Final    Comment: (NOTE) The Xpert SA Assay (FDA approved for NASAL specimens in patients 10 years of age and older), is one component of a comprehensive surveillance program. It is not intended to diagnose infection nor to guide or monitor treatment. Performed at [facility], [place] [address] [place], [place] [address]          Radiology Studies: Dg Cervical Spine 1 View  Result Date: 12/06/2017 CLINICAL DATA:  ACDF of C3-4, C4-5, C5-6.  [place] OR-19. EXAM: DG CERVICAL SPINE - 1 VIEW; DG C-ARM 61-120 MIN COMPARISON:  None. FINDINGS: Single lateral intraoperative fluoroscopic spot image of the cervical spine is provided. Anterior cervical fusion hardware appears appropriately positioned at the C3 through C6 levels, with intervening disc grafts. Hardware appears intact. Fluoroscopy was provided for 3 seconds. IMPRESSION: Intraoperative fluoroscopic spot image of the cervical spine demonstrating anterior cervical fusion hardware at the C3 through C6 levels. No evidence of surgical complicating feature. Electronically Signed   By: Franki Cabot M.D.   On: 12/06/2017 17:30   Dg C-arm 1-60 Min  Result Date: 12/06/2017 CLINICAL DATA:  ACDF of C3-4, C4-5, C5-6.  [place] OR-19. EXAM: DG CERVICAL SPINE - 1 VIEW; DG C-ARM 61-120 MIN COMPARISON:  None. FINDINGS: Single lateral intraoperative fluoroscopic spot image of the cervical spine is provided. Anterior cervical fusion hardware appears appropriately positioned at the C3 through C6 levels, with intervening disc grafts. Hardware appears intact. Fluoroscopy was provided for 3 seconds. IMPRESSION: Intraoperative fluoroscopic spot image of the cervical spine demonstrating anterior cervical fusion hardware at the C3 through C6 levels. No evidence of surgical complicating feature. Electronically Signed   By: Franki Cabot M.D.   On: 12/06/2017 17:30        Scheduled Meds: . chlorhexidine   Topical  Once  . docusate  100 mg Oral BID  . escitalopram  10 mg Oral QHS  . fluticasone  1 spray Each Nare Daily  . hydrALAZINE  25 mg Oral Q8H  . levothyroxine  75 mcg Oral QAC breakfast  . montelukast  10 mg Oral QHS  . mupirocin ointment  1 application Nasal BID  . pantoprazole sodium  20 mg Oral Daily  . senna  1 tablet Oral BID  . sodium chloride flush  3 mL Intravenous Q12H   Continuous Infusions: . sodium chloride 75 mL/hr at 12/07/17 1021  .  sodium chloride       LOS: 1 day    Time spent: [place], MD [facility]  If 7PM-7AM, please contact night-coverage [email or web address] Password [email or web address] 12/07/2017, 12:22 PM

## 2017-12-08 LAB — BASIC METABOLIC PANEL
Anion gap: 10 (ref 5–15)
BUN: 7 mg/dL — ABNORMAL LOW (ref 8–23)
CO2: 23 mmol/L (ref 22–32)
Calcium: 8.4 mg/dL — ABNORMAL LOW (ref 8.9–10.3)
Chloride: 92 mmol/L — ABNORMAL LOW (ref 98–111)
Creatinine, Ser: 0.74 mg/dL (ref 0.44–1.00)
GFR calc Af Amer: >60 mL/min (ref >60)
GFR calc non Af Amer: >60 mL/min (ref >60)
Glucose, Bld: 120 mg/dL — ABNORMAL HIGH (ref 70–99)
Potassium: 3.6 mmol/L (ref 3.5–5.1)
Sodium: 125 mmol/L — ABNORMAL LOW (ref 135–145)

## 2017-12-08 LAB — SODIUM: Sodium: 126 mmol/L — ABNORMAL LOW (ref 135–145)

## 2017-12-08 MED ORDER — DEXAMETHASONE SODIUM PHOSPHATE 10 MG/ML IJ SOLN
10.0000 mg | Freq: Four times a day (QID) | INTRAMUSCULAR | Status: AC
  Administered 2017-12-08 – 2017-12-09 (×4): 10 mg via INTRAVENOUS
  Filled 2017-12-08 (×4): qty 1

## 2017-12-08 MED ORDER — SODIUM CHLORIDE 0.9 % IV BOLUS
1000.0000 mL | Freq: Once | INTRAVENOUS | Status: AC
  Administered 2017-12-08: 1000 mL via INTRAVENOUS

## 2017-12-08 MED ORDER — PANTOPRAZOLE SODIUM 40 MG PO TBEC
40.0000 mg | DELAYED_RELEASE_TABLET | Freq: Two times a day (BID) | ORAL | Status: DC
  Administered 2017-12-08 – 2017-12-10 (×5): 40 mg via ORAL
  Filled 2017-12-08 (×5): qty 1

## 2017-12-08 MED ORDER — ALUM & MAG HYDROXIDE-SIMETH 200-200-20 MG/5ML PO SUSP
30.0000 mL | ORAL | Status: DC | PRN
  Administered 2017-12-08 – 2017-12-09 (×2): 30 mL via ORAL
  Filled 2017-12-08 (×2): qty 30

## 2017-12-08 MED ORDER — SODIUM CHLORIDE 1 G PO TABS
2.0000 g | ORAL_TABLET | Freq: Three times a day (TID) | ORAL | Status: DC
  Administered 2017-12-08 – 2017-12-10 (×7): 2 g via ORAL
  Filled 2017-12-08 (×9): qty 2

## 2017-12-08 NOTE — Progress Notes (Signed)
Subjective: Patient reports Doing well although still fairly sore in the back of her neck difficulty swallowing her arms and hands a little bit better from preop.  She does say that she is having a little bit of difficulty swallowing but it is not improved from yesterday as well as a little difficulty breathing but that is also improved from yesterday.  Objective: Vital signs in last 24 hours: Temp:  [98.3 F (36.8 C)-99.7 F (37.6 C)] 98.8 F (37.1 C) (09/07 0732) Pulse Rate:  [67-86] 79 (09/07 0732) Resp:  [17-20] 17 (09/07 0457) BP: (116-175)/(46-70) 175/70 (09/07 0732) SpO2:  [93 %-98 %] 93 % (09/07 0732)  Intake/Output from previous day: 09/06 0701 - 09/07 0700 In: 243 [P.O.:240; I.V.:3] Out: 751 [Urine:751] Intake/Output this shift: No intake/output data recorded.  Generalized weakness no focal deficits strength globally about 4 to 4+ out of 5 incision shows moderate amount of swelling possible subcutaneous seroma trachea appears midline  Lab Results: Recent Labs    12/07/17 0338  WBC 10.6*  HGB 11.5*  HCT 34.7*  PLT 348   BMET Recent Labs    12/07/17 0338 12/08/17 0305  NA 130* 125*  K 4.0 3.6  CL 97* 92*  CO2 24 23  GLUCOSE 120* 120*  BUN 9 7*  CREATININE 0.82 0.74  CALCIUM 8.4* 8.4*    Studies/Results: Dg Cervical Spine 1 View  Result Date: 12/06/2017 CLINICAL DATA:  ACDF of C3-4, C4-5, C5-6.  [place] OR-19. EXAM: DG CERVICAL SPINE - 1 VIEW; DG C-ARM 61-120 MIN COMPARISON:  None. FINDINGS: Single lateral intraoperative fluoroscopic spot image of the cervical spine is provided. Anterior cervical fusion hardware appears appropriately positioned at the C3 through C6 levels, with intervening disc grafts. Hardware appears intact. Fluoroscopy was provided for 3 seconds. IMPRESSION: Intraoperative fluoroscopic spot image of the cervical spine demonstrating anterior cervical fusion hardware at the C3 through C6 levels. No evidence of surgical complicating feature.  Electronically Signed   By: Franki Cabot M.D.   On: 12/06/2017 17:30   Dg C-arm 1-60 Min  Result Date: 12/06/2017 CLINICAL DATA:  ACDF of C3-4, C4-5, C5-6.   OR-19. EXAM: DG CERVICAL SPINE - 1 VIEW; DG C-ARM 61-120 MIN COMPARISON:  None. FINDINGS: Single lateral intraoperative fluoroscopic spot image of the cervical spine is provided. Anterior cervical fusion hardware appears appropriately positioned at the C3 through C6 levels, with intervening disc grafts. Hardware appears intact. Fluoroscopy was provided for 3 seconds. IMPRESSION: Intraoperative fluoroscopic spot image of the cervical spine demonstrating anterior cervical fusion hardware at the C3 through C6 levels. No evidence of surgical complicating feature. Electronically Signed   By: Franki Cabot M.D.   On: 12/06/2017 17:30    Assessment/Plan: Recommend continued observation in the progressive unit to ensure that the breathing and swallowing continue to improve.  Decadron for throat swelling.  Continue physical therapy and Occupational Therapy  LOS: 2 days     Vraj Denardo P 12/08/2017, 8:44 AM

## 2017-12-08 NOTE — Plan of Care (Signed)

## 2017-12-08 NOTE — Progress Notes (Signed)
PROGRESS NOTE    Regina [name]  UTM:[id number] DOB: 01/06/1941 DOA: 12/01/2017 PCP: Maurice Small, MD    Brief Narrative:  77 year old with past medical history relevant for hypothyroidism, hypertension, anxiety/depression, type 2 diabetes on oral hypoglycemics, sniffing and cervical spondylolysis pending neurosurgical evaluation who was admitted with syncope and significant hyponatremia.   Assessment & Plan:   Principal Problem:   Hyponatremia Active Problems:   Hypothyroidism   Insulin resistance   Hashimoto's disease   Cervical spondylolysis   Anxiety   #) Syncope secondary to hyponatremia: Hyponatremia is worse today.  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 -Monitor, patient is not particularly symptomatic -Give IV bolus and IV fluids for 1 day, will start sodium tablets -Recheck sodium, urine sodium, urine osmolality, urine creatinine - Echo on 12/02/2017 was unremarkable  #) Cervical spondylolysis with myelopathy: Patient is status post cervical spine surgery on 12/06/2017.  Patient appears to recovering some increasing bilateral upper extremity strength. -Physical therapy is recommending [facility] - Continue pain control -Hold aspirin 325  #) Hypertension: - Hold full dose aspirin - Hold HCTZ and triamterene  #) Hypothyroidism: -Continue levothyroxine 75 mcg  #) Type 2 diabetes: -Hold home metformin 850 mg twice daily -Sliding scale insulin, AC at bedtime  #)  pain/psych: -Continue escitalopram 10 mg nightly  #) Asthma/COPD: -Due PRN albuterol -Continue montelukast 10 mg nightly  Fluids: Tolerating p.o. Electrodes: Per above Nutrition: Regular  Prophylaxis: SCDs  Disposition: Pending stabilization of sodium and admission to [facility] inpatient [facility]  Full code  Consultants:   Neurosurgery  [facility]  Procedures:  Echo 12/02/17: - Left ventricle: The cavity size was normal.  Wall thickness was   normal. Systolic function was normal. The estimated ejection   fraction was in the range of 60% to 65%. Wall motion was normal;   there were no regional wall motion abnormalities. Left   ventricular diastolic function parameters were normal. - Aortic valve: Mildly calcified annulus. Trileaflet; mildly   calcified leaflets. - Mitral valve: Mildly thickened leaflets. There was moderate   regurgitation. - Left atrium: The atrium was at the upper limits of normal in   size. - Right atrium: Central venous pressure (est): 8 mm Hg. - Atrial septum: No defect or patent foramen ovale was identified. - Tricuspid valve: There was mild-moderate regurgitation. - Pulmonary arteries: PA peak pressure: 41 mm Hg (S).  - Pericardium, extracardiac: There was no pericardial effusion.  12/05/17 NSU surgery: PROCEDURE: 1. Discectomy at C3-4, C4-5, C5-6 for decompression of spinal cord and exiting nerve roots  2. Placement of intervertebral biomechanical device, Medtronic PTC PEEK 48m lordotic cage x3 3. Placement of anterior instrumentation consisting of interbody plate and screws spanning C3-C6 - Medtronic Atlantis translation plate 4. Use of morselized bone allograft  5. Arthrodesis C3-4, C4-5, C5-6, anterior interbody technique  6. Use of intraoperative microscope   Antimicrobials:  None   Subjective: Patient reports she i today that she is somewhat tired.  She continues to have significant pain and some dysphasia.  She denies any nausea, vomiting, diarrhea.  She is easily fatigable in her upper extremities.  Objective: Vitals:   12/07/17 2322 12/08/17 0457 12/08/17 0650 12/08/17 0732  BP: (!) 156/59 (!) 168/70 (!) 162/69 (!) 175/70  Pulse: 85 86  79  Resp: 18 17    Temp: 99.7 F (37.6 C) 99.5 F (37.5 C)  98.8 F (37.1 C)  TempSrc: Oral Oral  Oral  SpO2: 96% 95%  93%  Weight:      Height:        Intake/Output Summary (Last 24 hours) at 12/08/2017 1037 Last data  filed at 12/08/2017 0648 Gross per 24 hour  Intake 243 ml  Output 751 ml  Net -508 ml   Filed Weights   12/01/17 1131 12/01/17 1133 12/01/17 1909  Weight: 71.7 kg 71.7 kg 72.8 kg    Examination:  General exam: Appears calm and comfortable, currently in neck brace Respiratory system: Clear to auscultation. Respiratory effort normal. Cardiovascular system: Regular rate and rhythm, no murmurs Gastrointestinal system: Abdomen is nondistended, soft and nontender. No organomegaly or masses felt. Normal bowel sounds heard. Central nervous system: Alert and oriented.  Bilateral upper extremity strength is 3-4 out of 5, diminished sensation in upper extremity, bilateral lower extremity strength is 5 out of 5, intact sensation Extremities: Lower extremity edema Skin: Incision site is covered Psychiatry: Judgement and insight appear normal. Mood & affect appropriate.     Data Reviewed: I have personally reviewed following labs and imaging studies  CBC: Recent Labs  Lab 12/01/17 1202 12/01/17 1209 12/04/17 0351 12/07/17 0338  WBC 11.8*  --  11.9* 10.6*  NEUTROABS 9.9*  --   --   --   HGB 12.9 13.6 11.6* 11.5*  HCT 38.2 40.0 35.4* 34.7*  MCV 84.7  --  85.5 85.9  PLT 314  --  358 412   Basic Metabolic Panel: Recent Labs  Lab 12/04/17 0351 12/05/17 0347 12/05/17 1444 12/06/17 0430 12/07/17 0338 12/08/17 0305  NA 132* 134*  --  134* 130* 125*  K 3.9 3.8  --  4.8 4.0 3.6  CL 100 99  --  100 97* 92*  CO2 25 26  --  '27 24 23  ' GLUCOSE 122* 98  --  94 120* 120*  BUN 11 10  --  11 9 7*  CREATININE 0.80 0.91  --  0.93 0.82 0.74  CALCIUM 8.7* 8.8*  --  8.7* 8.4* 8.4*  MG 1.6*  --  1.9 1.9  --   --   PHOS  --   --   --  4.1  --   --    GFR: Estimated Creatinine Clearance: 55 mL/min (by C-G formula based on SCr of 0.74 mg/dL). Liver Function Tests: Recent Labs  Lab 12/01/17 1202 12/06/17 0430 12/07/17 0338  AST 17  --  16  ALT 12  --  13  ALKPHOS 59  --  44  BILITOT 0.7  --   0.8  PROT 6.2*  --  5.0*  ALBUMIN 3.8 3.1* 2.9*   No results for input(s): LIPASE, AMYLASE in the last 168 hours. No results for input(s): AMMONIA in the last 168 hours. Coagulation Profile: No results for input(s): INR, PROTIME in the last 168 hours. Cardiac Enzymes: Recent Labs  Lab 12/01/17 1202  CKTOTAL 96   BNP (last 3 results) No results for input(s): PROBNP in the last 8760 hours. HbA1C: No results for input(s): HGBA1C in the last 72 hours. CBG: Recent Labs  Lab 12/02/17 0909 12/02/17 1212 12/05/17 1741  GLUCAP 123* 141* 127*   Lipid Profile: No results for input(s): CHOL, HDL, LDLCALC, TRIG, CHOLHDL, LDLDIRECT in the last 72 hours. Thyroid Function Tests: No results for input(s): TSH, T4TOTAL, FREET4, T3FREE, THYROIDAB in the last 72 hours. Anemia Panel: No results for input(s): VITAMINB12, FOLATE, FERRITIN, TIBC, IRON, RETICCTPCT in the last 72 hours. Sepsis Labs: No results  for input(s): PROCALCITON, LATICACIDVEN in the last 168 hours.  Recent Results (from the past 240 hour(s))  Surgical pcr screen     Status: None   Collection Time: 11/29/17  1:37 PM  Result Value Ref Range Status   MRSA, PCR NEGATIVE NEGATIVE Final   Staphylococcus aureus NEGATIVE NEGATIVE Final    Comment: (NOTE) The Xpert SA Assay (FDA approved for NASAL specimens in patients 50 years of age and older), is one component of a comprehensive surveillance program. It is not intended to diagnose infection nor to guide or monitor treatment. Performed at [facility], [place] [address], [place], [place] [address]   Surgical PCR screen     Status: None   Collection Time: 12/06/17 12:42 PM  Result Value Ref Range Status   MRSA, PCR NEGATIVE NEGATIVE Final   Staphylococcus aureus NEGATIVE NEGATIVE Final    Comment: (NOTE) The Xpert SA Assay (FDA approved for NASAL specimens in patients 80 years of age and older), is one component of a comprehensive surveillance program. It is not  intended to diagnose infection nor to guide or monitor treatment. Performed at [facility], [place] [address], [place], [place] [address]          Radiology Studies: Dg Cervical Spine 1 View  Result Date: 12/06/2017 CLINICAL DATA:  ACDF of C3-4, C4-5, C5-6.  [place] OR-19. EXAM: DG CERVICAL SPINE - 1 VIEW; DG C-ARM 61-120 MIN COMPARISON:  None. FINDINGS: Single lateral intraoperative fluoroscopic spot image of the cervical spine is provided. Anterior cervical fusion hardware appears appropriately positioned at the C3 through C6 levels, with intervening disc grafts. Hardware appears intact. Fluoroscopy was provided for 3 seconds. IMPRESSION: Intraoperative fluoroscopic spot image of the cervical spine demonstrating anterior cervical fusion hardware at the C3 through C6 levels. No evidence of surgical complicating feature. Electronically Signed   By: Franki Cabot M.D.   On: 12/06/2017 17:30   Dg C-arm 1-60 Min  Result Date: 12/06/2017 CLINICAL DATA:  ACDF of C3-4, C4-5, C5-6.  [place] OR-19. EXAM: DG CERVICAL SPINE - 1 VIEW; DG C-ARM 61-120 MIN COMPARISON:  None. FINDINGS: Single lateral intraoperative fluoroscopic spot image of the cervical spine is provided. Anterior cervical fusion hardware appears appropriately positioned at the C3 through C6 levels, with intervening disc grafts. Hardware appears intact. Fluoroscopy was provided for 3 seconds. IMPRESSION: Intraoperative fluoroscopic spot image of the cervical spine demonstrating anterior cervical fusion hardware at the C3 through C6 levels. No evidence of surgical complicating feature. Electronically Signed   By: Franki Cabot M.D.   On: 12/06/2017 17:30        Scheduled Meds: . chlorhexidine   Topical Once  . dexamethasone  10 mg Intravenous Q6H  . docusate  100 mg Oral BID  . escitalopram  10 mg Oral QHS  . fluticasone  1 spray Each Nare Daily  . hydrALAZINE  25 mg Oral Q8H  . levothyroxine  75 mcg Oral QAC breakfast  .  montelukast  10 mg Oral QHS  . mupirocin ointment  1 application Nasal BID  . pantoprazole  40 mg Oral BID  . senna  1 tablet Oral BID  . sodium chloride flush  3 mL Intravenous Q12H  . sodium chloride  2 g Oral TID WC   Continuous Infusions: . sodium chloride    . sodium chloride 1,000 mL (12/08/17 1010)     LOS: 2 days    Time spent: 35    Regina Anchondo C  Shamya Macfadden, MD [facility]  If 7PM-7AM, please contact night-coverage [email or web address] Password [email or web address] 12/08/2017, 10:37 AM

## 2017-12-09 LAB — BASIC METABOLIC PANEL
Anion gap: 13 (ref 5–15)
BUN: 9 mg/dL (ref 8–23)
CO2: 23 mmol/L (ref 22–32)
Calcium: 8.8 mg/dL — ABNORMAL LOW (ref 8.9–10.3)
Chloride: 93 mmol/L — ABNORMAL LOW (ref 98–111)
Creatinine, Ser: 0.64 mg/dL (ref 0.44–1.00)
GFR calc Af Amer: >60 mL/min (ref >60)
GFR calc non Af Amer: >60 mL/min (ref >60)
Glucose, Bld: 133 mg/dL — ABNORMAL HIGH (ref 70–99)
Potassium: 3.4 mmol/L — ABNORMAL LOW (ref 3.5–5.1)
Sodium: 129 mmol/L — ABNORMAL LOW (ref 135–145)

## 2017-12-09 MED ORDER — POTASSIUM CHLORIDE CRYS ER 20 MEQ PO TBCR
40.0000 meq | EXTENDED_RELEASE_TABLET | Freq: Once | ORAL | Status: AC
  Administered 2017-12-09: 40 meq via ORAL
  Filled 2017-12-09: qty 2

## 2017-12-09 MED ORDER — HYDRALAZINE HCL 50 MG PO TABS
50.0000 mg | ORAL_TABLET | Freq: Three times a day (TID) | ORAL | Status: DC
  Administered 2017-12-09 – 2017-12-10 (×4): 50 mg via ORAL
  Filled 2017-12-09 (×4): qty 1

## 2017-12-09 NOTE — Progress Notes (Signed)
Overall patient doing somewhat better today.  Throat pain and swelling improved after starting Decadron.  Patient swallowing reasonably well.  Her voice is strong.  Neurologic exam is stable.  She continues to have some distal sensory loss with this is also slowly improving.  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  Continue efforts at mobilization.  Patient okay for inpatient rehabilitation when bed available.

## 2017-12-09 NOTE — Progress Notes (Addendum)
PROGRESS NOTE    Regina [name]  YIR:[id number] DOB: 17-Dec-1940 DOA: 12/01/2017 PCP: Maurice Small, MD    Brief Narrative:  77 year old with past medical history relevant for hypothyroidism, hypertension, anxiety/depression, type 2 diabetes on oral hypoglycemics, sniffing and cervical spondylolysis pending neurosurgical evaluation who was admitted with syncope and significant hyponatremia.   Assessment & Plan:   Principal Problem:   Hyponatremia Active Problems:   Hypothyroidism   Insulin resistance   Hashimoto's disease   Cervical spondylolysis   Anxiety   #) Syncope secondary to hyponatremia: Hyponatremia improving with IV bolus and salt tablets -Monitor, patient is not particularly symptomatic -Continue salt tablets - Echo on 12/02/2017 was unremarkable  #) Cervical spondylolysis with myelopathy: Patient is status post cervical spine surgery on 12/06/2017.  Patient appears to recovering some increasing bilateral upper extremity strength. -Physical therapy is recommending [facility] inpatient rehab - Continue pain control -Hold aspirin 325  #) Hypertension: - Hold full dose aspirin - Hold HCTZ and triamterene -Increase hydralazine to 50 mg 3 times daily  #) Hypothyroidism: -Continue levothyroxine 75 mcg  #) Type 2 diabetes: -Hold home metformin 850 mg twice daily -Sliding scale insulin, AC at bedtime  #)  pain/psych: -Continue escitalopram 10 mg nightly  #) Asthma/COPD: -Due PRN albuterol -Continue montelukast 10 mg nightly  Fluids: Tolerating p.o. Electrodes: Per above Nutrition: Regular  Prophylaxis: SCDs  Disposition: Pending admission to [facility] inpatient rehab  Full code  Consultants:   Neurosurgery  [facility] inpatient [facility]  Procedures:  Echo 12/02/17: - Left ventricle: The cavity size was normal. Wall thickness was   normal. Systolic function was normal. The estimated ejection   fraction was in the range of 60% to 65%. Wall motion was normal;  there were no regional wall motion abnormalities. Left   ventricular diastolic function parameters were normal. - Aortic valve: Mildly calcified annulus. Trileaflet; mildly   calcified leaflets. - Mitral valve: Mildly thickened leaflets. There was moderate   regurgitation. - Left atrium: The atrium was at the upper limits of normal in   size. - Right atrium: Central venous pressure (est): 8 mm Hg. - Atrial septum: No defect or patent foramen ovale was identified. - Tricuspid valve: There was mild-moderate regurgitation. - Pulmonary arteries: PA peak pressure: 41 mm Hg (S).  - Pericardium, extracardiac: There was no pericardial effusion.  12/05/17 [facility] surgery: PROCEDURE: 1. Discectomy at C3-4, C4-5, C5-6 for decompression of spinal cord and exiting nerve roots  2. Placement of intervertebral biomechanical device, Medtronic PTC PEEK 62m lordotic cage x3 3. Placement of anterior instrumentation consisting of interbody plate and screws spanning C3-C6 - Medtronic Atlantis translation plate 4. Use of morselized bone allograft  5. Arthrodesis C3-4, C4-5, C5-6, anterior interbody technique  6. Use of intraoperative microscope   Antimicrobials:  None   Subjective: Patient reports she is doing better today.  Her upper extremity weakness is improving somewhat however her lack of sensation is still present.  She has some proprioceptive difficulty however this appears to be improving.  She denies any nausea, vomiting, diarrhea, cough, congestion, rhinorrhea.  She does report some throat pain..  Objective: Vitals:   12/08/17 2326 12/09/17 0552 12/09/17 0609 12/09/17 0900  BP: (!) 158/68 (!) 156/58 (!) 170/79   Pulse: 74 62    Resp: 17 18    Temp: 98.1 F (36.7 C) 98 F (36.7 C)  98 F (36.7 C)  TempSrc: Oral Oral    SpO2: 97% 98%    Weight:  Height:        Intake/Output Summary (Last 24 hours) at 12/09/2017 1211 Last data filed at 12/09/2017 0900 Gross per 24 hour  Intake 1696.3  ml  Output -  Net 1696.3 ml   Filed Weights   12/01/17 1131 12/01/17 1133 12/01/17 1909  Weight: 71.7 kg 71.7 kg 72.8 kg    Examination:  General exam: Appears calm and comfortable, currently in neck brace Respiratory system: Clear to auscultation. Respiratory effort normal. Cardiovascular system: Regular rate and rhythm, no murmurs Gastrointestinal system: Abdomen is nondistended, soft and nontender. No organomegaly or masses felt. Normal bowel sounds heard. Central nervous system: Alert and oriented.  Bilateral upper extremity strength is 3-4 out of 5, diminished sensation in upper extremity, bilateral lower extremity strength is 5 out of 5, intact sensation Extremities: Lower extremity edema Skin: Incision site is clean dry and intact Psychiatry: Judgement and insight appear normal. Mood & affect appropriate.     Data Reviewed: I have personally reviewed following labs and imaging studies  CBC: Recent Labs  Lab 12/04/17 0351 12/07/17 0338  WBC 11.9* 10.6*  HGB 11.6* 11.5*  HCT 35.4* 34.7*  MCV 85.5 85.9  PLT 358 725   Basic Metabolic Panel: Recent Labs  Lab 12/04/17 0351 12/05/17 0347 12/05/17 1444 12/06/17 0430 12/07/17 0338 12/08/17 0305 12/08/17 1440 12/09/17 0337  NA 132* 134*  --  134* 130* 125* 126* 129*  K 3.9 3.8  --  4.8 4.0 3.6  --  3.4*  CL 100 99  --  100 97* 92*  --  93*  CO2 25 26  --  _0 --  23  GLUCOSE 122* 98  --  94 120* 120*  --  133*  BUN 11 10  --  11 9 7*  --  9  CREATININE 0.80 0.91  --  0.93 0.82 0.74  --  0.64  CALCIUM 8.7* 8.8*  --  8.7* 8.4* 8.4*  --  8.8*  MG 1.6*  --  1.9 1.9  --   --   --   --   PHOS  --   --   --  4.1  --   --   --   --    GFR: Estimated Creatinine Clearance: 55 mL/min (by C-G formula based on SCr of 0.64 mg/dL). Liver Function Tests: Recent Labs  Lab 12/06/17 0430 12/07/17 0338  AST  --  16  ALT  --  13  ALKPHOS  --  44  BILITOT  --  0.8  PROT  --  5.0*  ALBUMIN 3.1* 2.9*   No results for  input(s): LIPASE, AMYLASE in the last 168 hours. No results for input(s): AMMONIA in the last 168 hours. Coagulation Profile: No results for input(s): INR, PROTIME in the last 168 hours. Cardiac Enzymes: No results for input(s): CKTOTAL, CKMB, CKMBINDEX, TROPONINI in the last 168 hours. BNP (last 3 results) No results for input(s): PROBNP in the last 8760 hours. HbA1C: No results for input(s): HGBA1C in the last 72 hours. CBG: Recent Labs  Lab 12/02/17 1212 12/05/17 1741  GLUCAP 141* 127*   Lipid Profile: No results for input(s): CHOL, HDL, LDLCALC, TRIG, CHOLHDL, LDLDIRECT in the last 72 hours. Thyroid Function Tests: No results for input(s): TSH, T4TOTAL, FREET4, T3FREE, THYROIDAB in the last 72 hours. Anemia Panel: No results for input(s): VITAMINB12, FOLATE, FERRITIN, TIBC, IRON, RETICCTPCT in the last 72 hours. Sepsis Labs: No results for input(s): PROCALCITON, LATICACIDVEN in the last  168 hours.  Recent Results (from the past 240 hour(s))  Surgical pcr screen     Status: None   Collection Time: 11/29/17  1:37 PM  Result Value Ref Range Status   MRSA, PCR NEGATIVE NEGATIVE Final   Staphylococcus aureus NEGATIVE NEGATIVE Final    Comment: (NOTE) The Xpert SA Assay (FDA approved for NASAL specimens in patients 58 years of age and older), is one component of a comprehensive surveillance program. It is not intended to diagnose infection nor to guide or monitor treatment. Performed at [facility], [place] [address], [place], [place] [address]   Surgical PCR screen     Status: None   Collection Time: 12/06/17 12:42 PM  Result Value Ref Range Status   MRSA, PCR NEGATIVE NEGATIVE Final   Staphylococcus aureus NEGATIVE NEGATIVE Final    Comment: (NOTE) The Xpert SA Assay (FDA approved for NASAL specimens in patients 61 years of age and older), is one component of a comprehensive surveillance program. It is not intended to diagnose infection nor to guide or monitor  treatment. Performed at [facility], [place] [address] [place],  [address]          Radiology Studies: No results found.      Scheduled Meds: . chlorhexidine   Topical Once  . docusate  100 mg Oral BID  . escitalopram  10 mg Oral QHS  . fluticasone  1 spray Each Nare Daily  . hydrALAZINE  50 mg Oral Q8H  . levothyroxine  75 mcg Oral QAC breakfast  . montelukast  10 mg Oral QHS  . pantoprazole  40 mg Oral BID  . senna  1 tablet Oral BID  . sodium chloride flush  3 mL Intravenous Q12H  . sodium chloride  2 g Oral TID WC   Continuous Infusions: . sodium chloride 1,000 mL (12/08/17 1113)     LOS: 3 days    Time spent: [place], MD [facility]  If 7PM-7AM, please contact night-coverage [email or web address] Password [email or web address] 12/09/2017, 12:11 PM

## 2017-12-09 NOTE — Discharge Instructions (Signed)
Hyponatremia Hyponatremia is when the amount of salt (sodium) in your blood is too low. When salt levels are low, your cells absorb extra water and they swell. The swelling happens throughout the body, but it mostly affects the brain. Follow these instructions at home:  Take medicines only as told by your doctor. Many medicines can make this condition worse. Talk with your doctor about any medicines that you are currently taking.  Carefully follow a recommended diet as told by your doctor.  Carefully follow instructions from your doctor about fluid restrictions.  Keep all follow-up visits as told by your doctor. This is important.  Do not drink alcohol. Contact a doctor if:  You feel sicker to your stomach (nauseous).  You feel more confused.  You feel more tired (fatigued).  Your headache gets worse.  You feel weaker.  Your symptoms go away and then they come back.  You have trouble following the diet instructions. Get help right away if:  You start to twitch and shake (have a seizure).  You pass out (faint).  You keep having watery poop (diarrhea).  You keep throwing up (vomiting). This information is not intended to replace advice given to you by your health care provider. Make sure you discuss any questions you have with your health care provider. Document Released: 11/30/2010 Document Revised: 08/26/2015 Document Reviewed: 03/16/2014 [employer] Interactive Patient Education  2018 [employer]  

## 2017-12-09 NOTE — Discharge Summary (Signed)
Physician Discharge Summary  Regina [name] BMW:[id number] DOB: [birth year] DOA: 12/01/2017  PCP: Maurice Small, MD  Admit date: 12/01/2017 Discharge date: 12/10/2017  Admitted From: Home Disposition: [facility] inpatient re[facility]  Recommendations for Outpatient Follow-up:  1. Follow up with PCP in 1-2 weeks 2. Please obtain BMP/CBC in one week   Home Health: None Equipment/Devices: None  Discharge Condition: stable CODE STATUS: FULL Diet recommendation: Regular   Brief/Interim Summary:  #) Syncope secondary to hyponatremia: Patient was admitted with syncope and found to be profoundly hyponatremic.  Initially the etiology of her hyponatremia was unclear but is thought to be multifactorial including use of HCTZ, SSRI use, pain.  She was initially given some fluid restriction however her sodium did not improve much with this.  She was given an IV bolus and started on salt tablets.  Her urine electrolytes and urine creatinine were equivocal.  Her osmolality in the serum suggested strongly hypo-osmolar hyponatremia.  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  An echo done on 12/02/2017 was unremarkable.  Patient's sodium improved with holding her HCTZ which was discontinued on discharge.   #) Cervical spondylolysis with myelopathy: Patient was admitted with bilateral upper extremity weakness.  She did receive cervical spine surgery on 12/06/2017 with somewhat improvement in bilateral upper extremity strength but continued upper extremity weakness.  Her home aspirin 325 was held.  It is unclear if this should be restarted as it was primarily for use in primary prevention.  #) Hypertension: Full dose aspirin was held.  Patient's home HCTZ and triamterene were held.  She was started on hydralazine and this dose is being uptitrated.  She should continue the hydralazine for blood pressure.  Patient was on full dose aspirin prior to presenting.  It is unclear why she was on  this dose.  This was held in the setting of spinal surgery and can be restarted at the discretion of neurosurgery.  #) Type 2 diabetes: Patient's home metformin was held.  This may be restarted on discharge.  He was maintained on sliding scale insulin here.  #) Pain/psych: Patient was continued on home escitalopram 10 mg nightly.  #) Asthma/COPD: Patient was continued on PRN albuterol and montelukast 10 mg nightly  Discharge Diagnoses:  Principal Problem:   Hyponatremia Active Problems:   Hypothyroidism   Insulin resistance   Hashimoto's disease   Cervical spondylolysis   Anxiety    Discharge Instructions  Discharge Instructions    Call MD for:  difficulty breathing, headache or visual disturbances   Complete by:  As directed    Call MD for:  extreme fatigue   Complete by:  As directed    Call MD for:  hives   Complete by:  As directed    Call MD for:  persistant dizziness or light-headedness   Complete by:  As directed    Call MD for:  persistant nausea and vomiting   Complete by:  As directed    Call MD for:  redness, tenderness, or signs of infection (pain, swelling, redness, odor or green/yellow discharge around incision site)   Complete by:  As directed    Call MD for:  severe uncontrolled pain   Complete by:  As directed    Call MD for:  temperature >100.4   Complete by:  As directed    Diet - low sodium heart healthy   Complete by:  As directed    Discharge instructions   Complete by:  As directed    Please follow-up with your PCP in 1 to 2 weeks.   Increase activity slowly   Complete by:  As directed      Allergies as of 12/10/2017      Reactions   Ceclor [cefaclor] Rash   Naproxen Hives, Swelling   Mouth swelling   Sulfa Antibiotics Nausea And Vomiting, Other (See Comments)   Syncope/headaches    Amlodipine Cough   Augmentin [amoxicillin-pot Clavulanate] Nausea And Vomiting   Has patient had a PCN reaction causing immediate rash, facial/tongue/throat  swelling, SOB or lightheadedness with hypotension: No Has patient had a PCN reaction causing severe rash involving mucus membranes or skin necrosis: No Has patient had a PCN reaction that required hospitalization: No Has patient had a PCN reaction occurring within the last 10 years: No If all of the above answers are "NO", then may proceed with Cephalosporin use.   Other Nausea And Vomiting   "pain medications" or opoids/Anesthesia   Valsartan Cough      Medication List    STOP taking these medications   aspirin EC 325 MG tablet   triamterene-hydrochlorothiazide 37.5-25 MG tablet Commonly known as:  MAXZIDE-25     TAKE these medications   acetaminophen 500 MG tablet Commonly known as:  TYLENOL Take 1,000 mg by mouth every 6 (six) hours as needed (for headaches.).   albuterol 108 (90 Base) MCG/ACT inhaler Commonly known as:  PROVENTIL HFA;VENTOLIN HFA Inhale 1-2 puffs into the lungs every 6 (six) hours as needed for wheezing or shortness of breath (asthma/coughing).   APPLE CIDER VINEGAR PO Take 1 capsule by mouth daily with lunch.   carbamide peroxide 6.5 % OTIC solution Commonly known as:  DEBROX Place 5 drops into both ears daily as needed (wax build up).   CHERATUSSIN AC 100-10 MG/5ML syrup Generic drug:  guaiFENesin-codeine Take 5 mLs by mouth 4 (four) times daily. For 5 days.   diphenhydrAMINE 25 MG tablet Commonly known as:  BENADRYL Take 25 mg by mouth every 6 (six) hours as needed for allergies.   escitalopram 10 MG tablet Commonly known as:  LEXAPRO Take 10 mg by mouth at bedtime.   fexofenadine 180 MG tablet Commonly known as:  ALLEGRA Take 180 mg by mouth daily.   hydrALAZINE 50 MG tablet Commonly known as:  APRESOLINE Take 1 tablet (50 mg total) by mouth every 8 (eight) hours.   ibuprofen 200 MG tablet Commonly known as:  ADVIL,MOTRIN Take 600 mg by mouth every 8 (eight) hours as needed (for pain).   metaxalone 800 MG tablet Commonly known as:   SKELAXIN Take 800 mg by mouth 3 (three) times daily as needed for muscle spasms.   metFORMIN 850 MG tablet Commonly known as:  GLUCOPHAGE Take 850 mg by mouth 2 (two) times daily.   montelukast 10 MG tablet Commonly known as:  SINGULAIR Take 10 mg by mouth at bedtime.   oxyCODONE 5 MG immediate release tablet Commonly known as:  Oxy IR/ROXICODONE Take 1 tablet (5 mg total) by mouth every 3 (three) hours as needed for moderate pain ((score 4 to 6)).   SYNTHROID 75 MCG tablet Generic drug:  levothyroxine Take 75 mcg by mouth daily before breakfast.   triamcinolone cream 0.1 % Commonly known as:  KENALOG Apply 1 application topically as needed (once a week).   XANAX 0.25 MG tablet Generic drug:  ALPRAZolam Take 0.25 mg by mouth 3 (three) times daily.       Allergies  Allergen Reactions  . Ceclor [Cefaclor] Rash  . Naproxen Hives and Swelling    Mouth swelling  . Sulfa Antibiotics Nausea And Vomiting and Other (See Comments)    Syncope/headaches   . Amlodipine Cough  . Augmentin [Amoxicillin-Pot Clavulanate] Nausea And Vomiting    Has patient had a PCN reaction causing immediate rash, facial/tongue/throat swelling, SOB or lightheadedness with hypotension: No Has patient had a PCN reaction causing severe rash involving mucus membranes or skin necrosis: No Has patient had a PCN reaction that required hospitalization: No Has patient had a PCN reaction occurring within the last 10 years: No If all of the above answers are "NO", then may proceed with Cephalosporin use.   . Other Nausea And Vomiting    "pain medications" or opoids/Anesthesia  . Valsartan Cough    Consultations:  Neurosurgery  Inpatient rehab   Procedures/Studies: Dg Chest 2 View  Result Date: 12/01/2017 CLINICAL DATA:  Syncopal episode earlier this morning at approximately 1:30 a.m., causing a fall in her bathroom. Acute chest pain. EXAM: CHEST - 2 VIEW COMPARISON:  09/14/2015, 07/20/2014. FINDINGS:  AP SEMI-ERECT and LATERAL images were obtained. Cardiac silhouette normal in size, unchanged. Thoracic aorta atherosclerotic, unchanged. Hilar and mediastinal contours otherwise unremarkable. Lungs clear. Bronchovascular markings normal. Pulmonary vascularity normal. No visible pleural effusions. No pneumothorax. Degenerative changes involving the thoracic spine. IMPRESSION: No acute cardiopulmonary disease.  Stable examination. Electronically Signed   By: Evangeline Dakin M.D.   On: 12/01/2017 13:12   Dg Cervical Spine 1 View  Result Date: 12/06/2017 CLINICAL DATA:  ACDF of C3-4, C4-5, C5-6.  [place] OR-19. EXAM: DG CERVICAL SPINE - 1 VIEW; DG C-ARM 61-120 MIN COMPARISON:  None. FINDINGS: Single lateral intraoperative fluoroscopic spot image of the cervical spine is provided. Anterior cervical fusion hardware appears appropriately positioned at the C3 through C6 levels, with intervening disc grafts. Hardware appears intact. Fluoroscopy was provided for 3 seconds. IMPRESSION: Intraoperative fluoroscopic spot image of the cervical spine demonstrating anterior cervical fusion hardware at the C3 through C6 levels. No evidence of surgical complicating feature. Electronically Signed   By: Franki Cabot M.D.   On: 12/06/2017 17:30   Ct Head Wo Contrast  Result Date: 12/01/2017 CLINICAL DATA:  Syncopal episode at 1:30 a.m. last night resulting in a fall and blow to the left side of the head. Initial encounter. EXAM: CT HEAD WITHOUT CONTRAST CT CERVICAL SPINE WITHOUT CONTRAST TECHNIQUE: Multidetector CT imaging of the head and cervical spine was performed following the standard protocol without intravenous contrast. Multiplanar CT image reconstructions of the cervical spine were also generated. COMPARISON:  Cervical spine MRI 11/15/2017. FINDINGS: CT HEAD FINDINGS Brain: No evidence of acute infarction, hemorrhage, hydrocephalus, extra-axial collection or mass lesion/mass effect. Extensive hypoattenuation in the  periventricular and subcortical deep white matter consistent chronic microvascular ischemic change is noted. Vascular: No hyperdense vessel or unexpected calcification. Skull: Intact. Sinuses/Orbits: Mucosal thickening is seen in the right sphenoid sinus. Mild right ethmoid air cell disease also noted. Other: None. CT CERVICAL SPINE FINDINGS Alignment: Trace facet mediated anterolisthesis C3 and C4 noted, unchanged. Skull base and vertebrae: No acute fracture. No primary bone lesion or focal pathologic process. Soft tissues and spinal canal: No prevertebral fluid or swelling. No visible canal hematoma. Disc levels: Severe multilevel degenerative disc disease identified as seen on prior MRI. Upper chest: Lung apices clear. Other: None. IMPRESSION: No acute abnormality head or cervical spine. 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 Electronically Signed  By: Inge Rise M.D.   On: 12/01/2017 13:56   Ct Cervical Spine Wo Contrast  Result Date: 12/01/2017 CLINICAL DATA:  Syncopal episode at 1:30 a.m. last night resulting in a fall and blow to the left side of the head. Initial encounter. EXAM: CT HEAD WITHOUT CONTRAST CT CERVICAL SPINE WITHOUT CONTRAST TECHNIQUE: Multidetector CT imaging of the head and cervical spine was performed following the standard protocol without intravenous contrast. Multiplanar CT image reconstructions of the cervical spine were also generated. COMPARISON:  Cervical spine MRI 11/15/2017. FINDINGS: CT HEAD FINDINGS Brain: No evidence of acute infarction, hemorrhage, hydrocephalus, extra-axial collection or mass lesion/mass effect. Extensive hypoattenuation in the periventricular and subcortical deep white matter consistent chronic microvascular ischemic change is noted. Vascular: No hyperdense vessel or unexpected calcification. Skull: Intact. Sinuses/Orbits: Mucosal thickening is seen in the right sphenoid sinus. Mild right  ethmoid air cell disease also noted. Other: None. CT CERVICAL SPINE FINDINGS Alignment: Trace facet mediated anterolisthesis C3 and C4 noted, unchanged. Skull base and vertebrae: No acute fracture. No primary bone lesion or focal pathologic process. Soft tissues and spinal canal: No prevertebral fluid or swelling. No visible canal hematoma. Disc levels: Severe multilevel degenerative disc disease identified as seen on prior MRI. Upper chest: Lung apices clear. Other: None. IMPRESSION: No acute abnormality head or cervical spine. 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 Electronically Signed   By: Inge Rise M.D.   On: 12/01/2017 13:56   Mr Cervical Spine Wo Contrast  Addendum Date: 11/15/2017   ADDENDUM REPORT: 11/15/2017 15:08 ADDENDUM: Study discussed by telephone with Dr. Darcus Austin who is covering for Dr. Maurice Small, on 11/15/2017 at 1448 hours. Electronically Signed   By: Genevie Ann M.D.   On: 11/15/2017 15:08   Result Date: 11/15/2017 CLINICAL DATA:  77 year old female with loss of feeling in both hands, upper extremity weakness for 2 months. No known injury. EXAM: MRI CERVICAL SPINE WITHOUT CONTRAST TECHNIQUE: Multiplanar, multisequence MR imaging of the cervical spine was performed. No intravenous contrast was administered. COMPARISON:  None. FINDINGS: Alignment: Degenerative appearing anterolisthesis of C3 on C4 measures 2-3 millimeters. Superimposed straightening of cervical lordosis. Vertebrae: Degenerative marrow signal changes in the endplates and the posterior elements of C3 and C4. No convincing No marrow edema or acute osseous abnormality. Background bone marrow signal is normal. 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 There might also be faint abnormal signal within the cord at C3.  Above C3 and below C5 visible cord signal is normal. Posterior Fossa, vertebral arteries, paraspinal tissues: Cervicomedullary junction is within normal limits. Negative visible posterior fossa and brain parenchyma. Preserved major vascular flow voids in the neck. Posterior to the chronically degenerated left C3-C4 facet there is a small area of possible mild soft tissue inflammation (series 5, image 11). Alternatively, this may be an area of muscle denervation/atrophy, as the finding is not impressive on T1 weighted imaging. Otherwise negative neck soft tissues.  Negative visible lung apices. Disc levels: C2-C3: Negative disc. Moderate right facet hypertrophy. Moderate ligament flavum hypertrophy. No spinal stenosis. Mild left and moderate to severe right C3 foraminal stenosis. C3-C4: Mild anterolisthesis with disc space loss. Bulky posterior and foraminal disc bulge and endplate spurring. Superimposed bulky and severe facet and ligament flavum hypertrophy. Subsequent moderate spinal stenosis and spinal cord mass effect (series 7, image 7). Possible  abnormal cord signal here as described above. Severe bilateral C4 foraminal stenosis. C4-C5: Disc space loss with circumferential disc osteophyte complex and bulky posterior central component of disc (series 7, image 11). The appearance is not strongly suggestive of Ossification of the posterior longitudinal ligament (OPLL). Superimposed mild facet and ligament flavum hypertrophy. Moderate to severe spinal stenosis and spinal cord mass effect with abnormal cord signal (same image). Severe bilateral C5 foraminal stenosis. C5-C6: Disc space loss with bulky circumferential disc bulge and endplate spurring and broad-based posterior component, less severe than at the level above. Superimposed mild facet and ligament flavum hypertrophy. Mild to moderate spinal stenosis and spinal cord mass effect. Severe left and moderate to severe right C6 foraminal stenosis. C6-C7: Disc  space loss with bulky circumferential disc bulge and endplate spurring. Broad-based posterior component. Mild to moderate facet and ligament flavum hypertrophy. Mild spinal stenosis with no cord mass effect. Moderate to severe bilateral C7 foraminal stenosis. C7-T1: Mild disc bulging with broad-based posterior component. Mild to moderate facet and ligament flavum hypertrophy. Mild endplate spurring. No spinal stenosis. Mild bilateral C8 foraminal stenosis. No upper thoracic spinal stenosis, although there is moderate to severe ligament flavum hypertrophy at T2-T3 where mild degenerative appearing anterolisthesis is noted. IMPRESSION: 1. 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 2. Multilevel advanced disc and endplate degeneration, and also advanced upper cervical facet arthropathy in the setting of mild C3-C4 spondylolisthesis. At C4-C5 bulky midline disc is reminiscent of abnormal posterior longitudinal ligament, but no strong evidence of ossification of the posterior longitudinal ligament (OPLL). 3. Mild degenerative spinal stenosis at C6-C7. 4. Moderate or severe neural foraminal stenosis at the right C3, and bilateral C4 through C7 nerve levels. Electronically Signed: By: Genevie Ann M.D. On: 11/15/2017 14:26   Dg C-arm 1-60 Min  Result Date: 12/06/2017 CLINICAL DATA:  ACDF of C3-4, C4-5, C5-6.  [place] OR-19. EXAM: DG CERVICAL SPINE - 1 VIEW; DG C-ARM 61-120 MIN COMPARISON:  None. FINDINGS: Single lateral intraoperative fluoroscopic spot image of the cervical spine is provided. Anterior cervical fusion hardware appears appropriately positioned at the C3 through C6 levels, with intervening disc grafts. Hardware appears intact. Fluoroscopy was provided for 3 seconds. IMPRESSION: Intraoperative fluoroscopic spot image of the cervical spine demonstrating anterior  cervical fusion hardware at the C3 through C6 levels. No evidence of surgical complicating feature. Electronically Signed   By: Franki Cabot M.D.   On: 12/06/2017 17:30   Echo 12/02/17: - Left ventricle: The cavity size was normal. Wall thickness was normal. Systolic function was normal. The estimated ejection fraction was in the range of 60% to 65%. Wall motion was normal; there were no regional wall motion abnormalities. Left ventricular diastolic function parameters were normal. - Aortic valve: Mildly calcified annulus. Trileaflet; mildly calcified leaflets. - Mitral valve: Mildly thickened leaflets. There was moderate regurgitation. - Left atrium: The atrium was at the upper limits of normal in size. - Right atrium: Central venous pressure (est): 8 mm Hg. - Atrial septum: No defect or patent foramen ovale was identified. - Tricuspid valve: There was mild-moderate regurgitation. - Pulmonary arteries: PA peak pressure: 41 mm Hg (S).  - Pericardium, extracardiac: There was no pericardial effusion.  12/05/17 [facility] surgery: PROCEDURE: 1. Discectomy atC3-4, C4-5, C5-82fr decompression of spinal cord and exiting nerve roots  2. Placement of intervertebral biomechanical device,Medtronic PTC PEEK 611mlordotic cage x3 3. Placement  of anterior instrumentation consisting of interbody plate and screws [id number] - Medtronic Atlantis translation plate 4. Use of morselized bone allograft  5. ArthrodesisC3-4, C4-5, C5-6, anterior interbody technique  6. Use of intraoperative microscope    Subjective:   Discharge Exam: Vitals:   12/10/17 0610 12/10/17 0735  BP: (!) 143/63   Pulse:    Resp:    Temp:  98.5 F (36.9 C)  SpO2:     Vitals:   12/09/17 2327 12/10/17 0351 12/10/17 0610 12/10/17 0735  BP: (!) 135/58 (!) 133/55 (!) 143/63   Pulse: 63 60    Resp: 18 17    Temp: 98.1 F (36.7 C) 97.6 F (36.4 C)  98.5 F (36.9 C)  TempSrc: Oral Oral    SpO2: 98% 97%     Weight:      Height:       General exam: Appears calm and comfortable, currently in neck brace Respiratory system: Clear to auscultation. Respiratory effort normal. Cardiovascular system: Regular rate and rhythm, no murmurs Gastrointestinal system: Abdomen is nondistended, soft and nontender. No organomegaly or masses felt. Normal bowel sounds heard. Central nervous system: Alert and oriented.  Bilateral upper extremity strength is 4 out of 5, diminished sensation in upper extremity, bilateral lower extremity strength is 5 out of 5, intact sensation Extremities: Lower extremity edema Skin: Incision site is clean dry and intact Psychiatry: Judgement and insight appear normal. Mood & affect appropriate.     The results of significant diagnostics from this hospitalization (including imaging, microbiology, ancillary and laboratory) are listed below for reference.     Microbiology: Recent Results (from the past 240 hour(s))  Surgical PCR screen     Status: None   Collection Time: 12/06/17 12:42 PM  Result Value Ref Range Status   MRSA, PCR NEGATIVE NEGATIVE Final   Staphylococcus aureus NEGATIVE NEGATIVE Final    Comment: (NOTE) The Xpert SA Assay (FDA approved for NASAL specimens in patients 42 years of age and older), is one component of a comprehensive surveillance program. It is not intended to diagnose infection nor to guide or monitor treatment. Performed at [facility], [place] [address], [place], [place] [address]      Labs: BNP (last 3 results) No results for input(s): BNP in the last 8760 hours. Basic Metabolic Panel: Recent Labs  Lab 12/04/17 0351  12/05/17 1444 12/06/17 0430 12/07/17 0338 12/08/17 0305 12/08/17 1440 12/09/17 0337 12/10/17 0206  NA 132*   < >  --  134* 130* 125* 126* 129* 131*  K 3.9   < >  --  4.8 4.0 3.6  --  3.4* 3.4*  CL 100   < >  --  100 97* 92*  --  93* 98  CO2 25   < >  --  _0 --  23 23  GLUCOSE 122*   < >  --  94 120*  120*  --  133* 97  BUN 11   < >  --  11 9 7*  --  9 10  CREATININE 0.80   < >  --  0.93 0.82 0.74  --  0.64 0.71  CALCIUM 8.7*   < >  --  8.7* 8.4* 8.4*  --  8.8* 8.7*  MG 1.6*  --  1.9 1.9  --   --   --   --   --   PHOS  --   --   --  4.1  --   --   --   --   --    < > =  values in this interval not displayed.   Liver Function Tests: Recent Labs  Lab 12/06/17 0430 12/07/17 0338  AST  --  16  ALT  --  13  ALKPHOS  --  44  BILITOT  --  0.8  PROT  --  5.0*  ALBUMIN 3.1* 2.9*   No results for input(s): LIPASE, AMYLASE in the last 168 hours. No results for input(s): AMMONIA in the last 168 hours. CBC: Recent Labs  Lab 12/04/17 0351 12/07/17 0338  WBC 11.9* 10.6*  HGB 11.6* 11.5*  HCT 35.4* 34.7*  MCV 85.5 85.9  PLT 358 348   Cardiac Enzymes: No results for input(s): CKTOTAL, CKMB, CKMBINDEX, TROPONINI in the last 168 hours. BNP: Invalid input(s): POCBNP CBG: Recent Labs  Lab 12/05/17 1741  GLUCAP 127*   D-Dimer No results for input(s): DDIMER in the last 72 hours. Hgb A1c No results for input(s): HGBA1C in the last 72 hours. Lipid Profile No results for input(s): CHOL, HDL, LDLCALC, TRIG, CHOLHDL, LDLDIRECT in the last 72 hours. Thyroid function studies No results for input(s): TSH, T4TOTAL, T3FREE, THYROIDAB in the last 72 hours.  Invalid input(s): FREET3 Anemia work up No results for input(s): VITAMINB12, FOLATE, FERRITIN, TIBC, IRON, RETICCTPCT in the last 72 hours. Urinalysis    Component Value Date/Time   COLORURINE YELLOW 12/02/2017 1747   APPEARANCEUR CLEAR 12/02/2017 1747   LABSPEC 1.008 12/02/2017 1747   PHURINE 6.0 12/02/2017 1747   GLUCOSEU NEGATIVE 12/02/2017 1747   HGBUR NEGATIVE 12/02/2017 1747   BILIRUBINUR NEGATIVE 12/02/2017 1747   KETONESUR NEGATIVE 12/02/2017 1747   PROTEINUR NEGATIVE 12/02/2017 1747   NITRITE NEGATIVE 12/02/2017 1747   LEUKOCYTESUR NEGATIVE 12/02/2017 1747   Sepsis Labs Invalid input(s): PROCALCITONIN,  WBC,   LACTICIDVEN Microbiology Recent Results (from the past 240 hour(s))  Surgical PCR screen     Status: None   Collection Time: 12/06/17 12:42 PM  Result Value Ref Range Status   MRSA, PCR NEGATIVE NEGATIVE Final   Staphylococcus aureus NEGATIVE NEGATIVE Final    Comment: (NOTE) The Xpert SA Assay (FDA approved for NASAL specimens in patients 67 years of age and older), is one component of a comprehensive surveillance program. It is not intended to diagnose infection nor to guide or monitor treatment. Performed at [facility], [place] [address], [place], [place] [address]      Time coordinating discharge: 69  SIGNED:   Cristy Folks, MD  [facility] 12/10/2017, 11:19 AM   If 7PM-7AM, please contact night-coverage [email or web address] Password [email or web address]

## 2017-12-10 ENCOUNTER — Inpatient Hospital Stay (HOSPITAL_COMMUNITY)
Admission: RE | Admit: 2017-12-10 | Discharge: 2017-12-20 | DRG: 560 | Disposition: A | Discharge status: Home Health | Payer: Medicare Other | Source: Intra-hospital | Attending: Physical Medicine & Rehabilitation | Admitting: Physical Medicine & Rehabilitation

## 2017-12-10 DIAGNOSIS — I10 Essential (primary) hypertension: Secondary | ICD-10-CM

## 2017-12-10 DIAGNOSIS — E871 Hypo-osmolality and hyponatremia: Secondary | ICD-10-CM

## 2017-12-10 DIAGNOSIS — Z79899 Other long term (current) drug therapy: Secondary | ICD-10-CM

## 2017-12-10 DIAGNOSIS — D72829 Elevated white blood cell count, unspecified: Secondary | ICD-10-CM | POA: Diagnosis not present

## 2017-12-10 DIAGNOSIS — E8809 Other disorders of plasma-protein metabolism, not elsewhere classified: Secondary | ICD-10-CM | POA: Diagnosis present

## 2017-12-10 DIAGNOSIS — F329 Major depressive disorder, single episode, unspecified: Secondary | ICD-10-CM | POA: Diagnosis present

## 2017-12-10 DIAGNOSIS — E876 Hypokalemia: Secondary | ICD-10-CM | POA: Diagnosis present

## 2017-12-10 DIAGNOSIS — F419 Anxiety disorder, unspecified: Secondary | ICD-10-CM

## 2017-12-10 DIAGNOSIS — R0989 Other specified symptoms and signs involving the circulatory and respiratory systems: Secondary | ICD-10-CM | POA: Diagnosis not present

## 2017-12-10 DIAGNOSIS — G959 Disease of spinal cord, unspecified: Secondary | ICD-10-CM | POA: Diagnosis present

## 2017-12-10 DIAGNOSIS — G8918 Other acute postprocedural pain: Secondary | ICD-10-CM | POA: Diagnosis not present

## 2017-12-10 DIAGNOSIS — Z7982 Long term (current) use of aspirin: Secondary | ICD-10-CM | POA: Diagnosis not present

## 2017-12-10 DIAGNOSIS — M7989 Other specified soft tissue disorders: Secondary | ICD-10-CM | POA: Diagnosis not present

## 2017-12-10 DIAGNOSIS — K5900 Constipation, unspecified: Secondary | ICD-10-CM | POA: Diagnosis present

## 2017-12-10 DIAGNOSIS — I169 Hypertensive crisis, unspecified: Secondary | ICD-10-CM | POA: Diagnosis not present

## 2017-12-10 DIAGNOSIS — E119 Type 2 diabetes mellitus without complications: Secondary | ICD-10-CM

## 2017-12-10 DIAGNOSIS — Z886 Allergy status to analgesic agent status: Secondary | ICD-10-CM

## 2017-12-10 DIAGNOSIS — D62 Acute posthemorrhagic anemia: Secondary | ICD-10-CM | POA: Diagnosis present

## 2017-12-10 DIAGNOSIS — Z9049 Acquired absence of other specified parts of digestive tract: Secondary | ICD-10-CM | POA: Diagnosis not present

## 2017-12-10 DIAGNOSIS — Z881 Allergy status to other antibiotic agents status: Secondary | ICD-10-CM

## 2017-12-10 DIAGNOSIS — Z7984 Long term (current) use of oral hypoglycemic drugs: Secondary | ICD-10-CM | POA: Diagnosis not present

## 2017-12-10 DIAGNOSIS — Z4789 Encounter for other orthopedic aftercare: Secondary | ICD-10-CM | POA: Diagnosis present

## 2017-12-10 DIAGNOSIS — J45909 Unspecified asthma, uncomplicated: Secondary | ICD-10-CM

## 2017-12-10 DIAGNOSIS — Z888 Allergy status to other drugs, medicaments and biological substances status: Secondary | ICD-10-CM | POA: Diagnosis not present

## 2017-12-10 DIAGNOSIS — J449 Chronic obstructive pulmonary disease, unspecified: Secondary | ICD-10-CM | POA: Diagnosis present

## 2017-12-10 DIAGNOSIS — Z87891 Personal history of nicotine dependence: Secondary | ICD-10-CM | POA: Diagnosis not present

## 2017-12-10 DIAGNOSIS — Z882 Allergy status to sulfonamides status: Secondary | ICD-10-CM | POA: Diagnosis not present

## 2017-12-10 DIAGNOSIS — E063 Autoimmune thyroiditis: Secondary | ICD-10-CM

## 2017-12-10 DIAGNOSIS — E46 Unspecified protein-calorie malnutrition: Secondary | ICD-10-CM

## 2017-12-10 DIAGNOSIS — M5000 Cervical disc disorder with myelopathy, unspecified cervical region: Secondary | ICD-10-CM

## 2017-12-10 LAB — GLUCOSE, CAPILLARY
Glucose-Capillary: 137 mg/dL — ABNORMAL HIGH (ref 70–99)
Glucose-Capillary: 114 mg/dL — ABNORMAL HIGH (ref 70–99)

## 2017-12-10 LAB — CBC WITH DIFFERENTIAL/PLATELET
Abs Immature Granulocytes: 0 10*3/uL (ref 0.0–0.1)
Basophils Absolute: 0 10*3/uL (ref 0.0–0.1)
Basophils Relative: 0 %
Eosinophils Absolute: 0.2 10*3/uL (ref 0.0–0.7)
Eosinophils Relative: 2 %
HCT: 33.5 % — ABNORMAL LOW (ref 36.0–46.0)
Hemoglobin: 11 g/dL — ABNORMAL LOW (ref 12.0–15.0)
Immature Granulocytes: 0 %
Lymphocytes Relative: 19 %
Lymphs Abs: 1.9 10*3/uL (ref 0.7–4.0)
MCH: 28.4 pg (ref 26.0–34.0)
MCHC: 32.8 g/dL (ref 30.0–36.0)
MCV: 86.6 fL (ref 78.0–100.0)
Monocytes Absolute: 1.1 10*3/uL — ABNORMAL HIGH (ref 0.1–1.0)
Monocytes Relative: 11 %
Neutro Abs: 6.8 10*3/uL (ref 1.7–7.7)
Neutrophils Relative %: 68 %
Platelets: 396 10*3/uL (ref 150–400)
RBC: 3.87 MIL/uL (ref 3.87–5.11)
RDW: 15.4 % (ref 11.5–15.5)
WBC: 10 10*3/uL (ref 4.0–10.5)

## 2017-12-10 LAB — COMPREHENSIVE METABOLIC PANEL
ALT: 14 U/L (ref 0–44)
AST: 14 U/L — ABNORMAL LOW (ref 15–41)
Albumin: 3.1 g/dL — ABNORMAL LOW (ref 3.5–5.0)
Alkaline Phosphatase: 60 U/L (ref 38–126)
Anion gap: 8 (ref 5–15)
BUN: 12 mg/dL (ref 8–23)
CO2: 26 mmol/L (ref 22–32)
Calcium: 9 mg/dL (ref 8.9–10.3)
Chloride: 99 mmol/L (ref 98–111)
Creatinine, Ser: 0.81 mg/dL (ref 0.44–1.00)
GFR calc Af Amer: >60 mL/min (ref >60)
GFR calc non Af Amer: >60 mL/min (ref >60)
Glucose, Bld: 109 mg/dL — ABNORMAL HIGH (ref 70–99)
Potassium: 4.2 mmol/L (ref 3.5–5.1)
Sodium: 133 mmol/L — ABNORMAL LOW (ref 135–145)
Total Bilirubin: 0.4 mg/dL (ref 0.3–1.2)
Total Protein: 5.4 g/dL — ABNORMAL LOW (ref 6.5–8.1)

## 2017-12-10 LAB — BASIC METABOLIC PANEL
Anion gap: 10 (ref 5–15)
BUN: 10 mg/dL (ref 8–23)
CO2: 23 mmol/L (ref 22–32)
Calcium: 8.7 mg/dL — ABNORMAL LOW (ref 8.9–10.3)
Chloride: 98 mmol/L (ref 98–111)
Creatinine, Ser: 0.71 mg/dL (ref 0.44–1.00)
GFR calc Af Amer: >60 mL/min (ref >60)
GFR calc non Af Amer: >60 mL/min (ref >60)
Glucose, Bld: 97 mg/dL (ref 70–99)
Potassium: 3.4 mmol/L — ABNORMAL LOW (ref 3.5–5.1)
Sodium: 131 mmol/L — ABNORMAL LOW (ref 135–145)

## 2017-12-10 MED ORDER — ACETAMINOPHEN 325 MG PO TABS
325.0000 mg | ORAL_TABLET | ORAL | Status: DC | PRN
  Administered 2017-12-14: 650 mg via ORAL
  Filled 2017-12-10: qty 2

## 2017-12-10 MED ORDER — ESCITALOPRAM OXALATE 10 MG PO TABS
10.0000 mg | ORAL_TABLET | Freq: Every day | ORAL | Status: DC
  Administered 2017-12-10 – 2017-12-19 (×10): 10 mg via ORAL
  Filled 2017-12-10 (×10): qty 1

## 2017-12-10 MED ORDER — SORBITOL 70 % SOLN
30.0000 mL | Freq: Every day | Status: DC | PRN
  Administered 2017-12-13: 30 mL via ORAL
  Filled 2017-12-10 (×2): qty 30

## 2017-12-10 MED ORDER — HYDRALAZINE HCL 50 MG PO TABS: 50.0000 mg | ORAL_TABLET | Freq: Three times a day (TID) | ORAL | 0 refills | Status: DC

## 2017-12-10 MED ORDER — BISACODYL 10 MG RE SUPP
10.0000 mg | Freq: Every day | RECTAL | Status: DC | PRN
  Administered 2017-12-14: 10 mg via RECTAL
  Filled 2017-12-10: qty 1

## 2017-12-10 MED ORDER — SODIUM CHLORIDE 1 G PO TABS
2.0000 g | ORAL_TABLET | Freq: Three times a day (TID) | ORAL | Status: DC
  Administered 2017-12-10 – 2017-12-14 (×12): 2 g via ORAL
  Filled 2017-12-10 (×13): qty 2

## 2017-12-10 MED ORDER — DOCUSATE SODIUM 50 MG/5ML PO LIQD
100.0000 mg | Freq: Two times a day (BID) | ORAL | Status: DC
  Administered 2017-12-10 – 2017-12-12 (×5): 100 mg via ORAL
  Filled 2017-12-10 (×5): qty 10

## 2017-12-10 MED ORDER — POTASSIUM CHLORIDE CRYS ER 20 MEQ PO TBCR
40.0000 meq | EXTENDED_RELEASE_TABLET | Freq: Once | ORAL | Status: AC
  Administered 2017-12-10: 40 meq via ORAL
  Filled 2017-12-10: qty 2

## 2017-12-10 MED ORDER — ALBUTEROL SULFATE (2.5 MG/3ML) 0.083% IN NEBU: 3.0000 mL | INHALATION_SOLUTION | Freq: Four times a day (QID) | RESPIRATORY_TRACT | Status: DC | PRN

## 2017-12-10 MED ORDER — INSULIN ASPART 100 UNIT/ML ~~LOC~~ SOLN
0.0000 [IU] | Freq: Three times a day (TID) | SUBCUTANEOUS | Status: DC
  Administered 2017-12-10 – 2017-12-20 (×3): 2 [IU] via SUBCUTANEOUS

## 2017-12-10 MED ORDER — HYDRALAZINE HCL 50 MG PO TABS
50.0000 mg | ORAL_TABLET | Freq: Three times a day (TID) | ORAL | Status: DC
  Administered 2017-12-10 – 2017-12-20 (×26): 50 mg via ORAL
  Filled 2017-12-10 (×29): qty 1

## 2017-12-10 MED ORDER — SENNA 8.6 MG PO TABS
1.0000 | ORAL_TABLET | Freq: Two times a day (BID) | ORAL | Status: DC
  Administered 2017-12-10 – 2017-12-13 (×6): 8.6 mg via ORAL
  Filled 2017-12-10 (×6): qty 1

## 2017-12-10 MED ORDER — ONDANSETRON HCL 4 MG/2ML IJ SOLN: 4.0000 mg | Freq: Four times a day (QID) | INTRAMUSCULAR | Status: DC | PRN

## 2017-12-10 MED ORDER — MONTELUKAST SODIUM 10 MG PO TABS
10.0000 mg | ORAL_TABLET | Freq: Every day | ORAL | Status: DC
  Administered 2017-12-10 – 2017-12-19 (×10): 10 mg via ORAL
  Filled 2017-12-10 (×10): qty 1

## 2017-12-10 MED ORDER — OXYCODONE HCL 5 MG PO TABS
5.0000 mg | ORAL_TABLET | ORAL | Status: DC | PRN
  Administered 2017-12-10 – 2017-12-20 (×44): 5 mg via ORAL
  Filled 2017-12-10 (×46): qty 1

## 2017-12-10 MED ORDER — ALPRAZOLAM 0.25 MG PO TABS
0.2500 mg | ORAL_TABLET | Freq: Three times a day (TID) | ORAL | Status: DC | PRN
  Administered 2017-12-10 – 2017-12-20 (×14): 0.25 mg via ORAL
  Filled 2017-12-10 (×16): qty 1

## 2017-12-10 MED ORDER — ONDANSETRON HCL 4 MG PO TABS: 4.0000 mg | ORAL_TABLET | Freq: Four times a day (QID) | ORAL | Status: DC | PRN

## 2017-12-10 MED ORDER — FLUTICASONE PROPIONATE 50 MCG/ACT NA SUSP
1.0000 | Freq: Every day | NASAL | Status: DC
  Administered 2017-12-11 – 2017-12-20 (×9): 1 via NASAL

## 2017-12-10 MED ORDER — METAXALONE 800 MG PO TABS
800.0000 mg | ORAL_TABLET | Freq: Three times a day (TID) | ORAL | Status: DC | PRN
  Administered 2017-12-10 – 2017-12-20 (×20): 800 mg via ORAL
  Filled 2017-12-10 (×23): qty 1

## 2017-12-10 MED ORDER — OXYCODONE HCL 5 MG PO TABS: 5.0000 mg | ORAL_TABLET | ORAL | 0 refills | Status: DC | PRN

## 2017-12-10 MED ORDER — LEVOTHYROXINE SODIUM 75 MCG PO TABS
75.0000 ug | ORAL_TABLET | Freq: Every day | ORAL | Status: DC
  Administered 2017-12-11 – 2017-12-20 (×10): 75 ug via ORAL
  Filled 2017-12-10 (×10): qty 1

## 2017-12-10 MED ORDER — POLYETHYLENE GLYCOL 3350 17 G PO PACK
17.0000 g | PACK | Freq: Every day | ORAL | Status: DC | PRN
  Administered 2017-12-10 – 2017-12-11 (×2): 17 g via ORAL
  Filled 2017-12-10 (×2): qty 1

## 2017-12-10 NOTE — Progress Notes (Signed)
Physical Therapy Treatment Patient Details Name: Regina [name] MRN: [id number] DOB: 08/16/1940 Today's Date: 12/10/2017    History of Present Illness Pt is a 77 y/o female admitted secondary to possible syncope. Pt found to have hyponatremia as well. 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 PMH includes hashimotos disease, asthma, and cervical spondylosis. Pt now s/p ANTERIOR CERVICAL DECOMPRESSION/DISCECTOMY FUSION 3 levels    PT Comments    Patient seen for activity progression, tolerated HHA ambulation to hall today. Continues to show deficits in stability and overall physical function. Improvements noted in activity tolerance. Current POC remains appropriate. Plan for CIR.   Follow Up Recommendations  CIR;Supervision/Assistance - 24 hour     Equipment Recommendations  Rolling walker with 5" wheels    Recommendations for Other Services OT consult     Precautions / Restrictions Precautions Precautions: Fall Restrictions Weight Bearing Restrictions: No    Mobility  Bed Mobility Overal bed mobility: Needs Assistance Bed Mobility: Sit to Sidelying           General bed mobility comments: pt in chair on arrival  Transfers Overall transfer level: Needs assistance Equipment used: Rolling walker (2 wheeled) Transfers: Sit to/from Stand Sit to Stand: Min assist         General transfer comment: min assist for stability when powering up to standing. VCs for hand placement and positioning prior to elevation to upright  Ambulation/Gait Ambulation/Gait assistance: Min assist Gait Distance (Feet): 70 Feet Assistive device: 1 person hand held assist Gait Pattern/deviations: Step-through pattern;Decreased step length - right;Decreased weight shift to right;Decreased dorsiflexion - right;Decreased stride length;Wide base of support;Antalgic Gait velocity: decreased Gait velocity interpretation: <1.31 ft/sec,  indicative of household ambulator General Gait Details: Patient required HHA for stability with wrap around support. Max multi modal cues for posture and pacing. ocassional LOB noted   Stairs             Wheelchair Mobility    Modified Rankin (Stroke Patients Only)       Balance Overall balance assessment: Needs assistance Sitting-balance support: No upper extremity supported;Feet supported Sitting balance-Leahy Scale: Fair     Standing balance support: Bilateral upper extremity supported Standing balance-Leahy Scale: Poor Standing balance comment: Hands on assist for static standing via external support                            Cognition Arousal/Alertness: Awake/alert Behavior During Therapy: WFL for tasks assessed/performed Overall Cognitive Status: Within Functional Limits for tasks assessed                                        Exercises      General Comments        Pertinent Vitals/Pain Pain Assessment: Faces Faces Pain Scale: Hurts little more Pain Location: neck Pain Descriptors / Indicators: Aching;Grimacing;Guarding Pain Intervention(s): Monitored during session    Home Living     Available Help at Discharge: (spouse available 24/7; daughter 24 year old Pharmacist, hospital)                Prior Function            PT Goals (current goals can now be found in the care plan section) Acute Rehab PT Goals Patient Stated Goal: To be able to take care of myself again  PT Goal Formulation: With patient Time For Goal Achievement: 12/17/17 Potential to Achieve Goals: Good Progress towards PT goals: Progressing toward goals    Frequency    Min 3X/week      PT Plan Current plan remains appropriate    Co-evaluation PT/OT/SLP Co-Evaluation/Treatment: (dove tailed session)            AM-PAC PT "6 Clicks" Daily Activity  Outcome Measure  Difficulty turning over in bed (including adjusting bedclothes, sheets and  blankets)?: A Little Difficulty moving from lying on back to sitting on the side of the bed? : Unable Difficulty sitting down on and standing up from a chair with arms (e.g., wheelchair, bedside commode, etc,.)?: A Little Help needed moving to and from a bed to chair (including a wheelchair)?: A Little Help needed walking in hospital room?: A Little Help needed climbing 3-5 steps with a railing? : A Lot 6 Click Score: 15    End of Session Equipment Utilized During Treatment: Cervical collar Activity Tolerance: Patient tolerated treatment well Patient left: with call bell/phone within reach;with family/visitor present;in bed Nurse Communication: Mobility status PT Visit Diagnosis: Unsteadiness on feet (R26.81);Muscle weakness (generalized) (M62.81);History of falling (Z91.81)     Time: 4239-5320 PT Time Calculation (min) (ACUTE ONLY): 17 min  Charges:  $Gait Training: 8-22 mins                     Alben Deeds, PT DPT  Board Certified Neurologic Specialist [place] 12/10/2017, 1:30 PM

## 2017-12-10 NOTE — Progress Notes (Signed)
Patient arrived in rehab unit today,informed about rehab process including patient safety plan and rehab booklet.

## 2017-12-10 NOTE — IPOC Note (Signed)
Overall Plan of Care [facility]) Patient Details Name: Regina [name] MRN: [id number] DOB: [birth year]  Admitting Diagnosis: Cervical myelopathy  Hospital Problems: Active Problems:   Myelopathy ([place])   Hypertensive crisis   Diabetes mellitus type 2 in nonobese ([place])   Hypoalbuminemia due to protein-calorie malnutrition ([place])   Acute blood loss anemia     Functional Problem List: Nursing Behavior, Sensory, Skin Integrity, Edema, Medication Management, Nutrition, Pain, Perception, Motor  PT Balance, Endurance, Motor, Safety, Pain  OT Balance, Endurance, Motor, Sensory, Pain  SLP Edema  TR         Basic ADL's: OT Grooming, Bathing, Dressing, Toileting     Advanced  ADL's: OT Simple Meal Preparation     Transfers: PT Bed Mobility, Bed to Chair, Car, Manufacturing systems engineer, Metallurgist: PT Ambulation, Stairs     Additional Impairments: OT None  SLP Swallowing(only due to edema not impacting swallow function with soft diet)      TR      Anticipated Outcomes Item Anticipated Outcome  Self Feeding mod I   Swallowing      Basic self-care  mod I   Toileting  mod I   Bathroom Transfers supervision - mod I for shower transfer   Bowel/Bladder  mod indip  Transfers  modI   Locomotion  S with LRAD  Communication     Cognition     Pain  less<3  Safety/Judgment  mod indip   Therapy Plan: PT Intensity: Minimum of 1-2 x/day ,45 to 90 minutes PT Frequency: 5 out of 7 days PT Duration Estimated Length of Stay: 8-12 days OT Intensity: Minimum of 1-2 x/day, 45 to 90 minutes OT Frequency: 5 out of 7 days OT Duration/Estimated Length of Stay: 10-12 days      Team Interventions: Nursing Interventions Patient/Family Education, Pain Management, Dysphagia/Aspiration Precaution Training, Discharge Planning, Medication Management, Skin Care/Wound Management, Bowel Management  PT interventions Ambulation/gait training, Discharge planning, Functional  mobility training, Psychosocial support, Therapeutic Activities, Visual/perceptual remediation/compensation, Therapeutic Exercise, Skin care/wound management, Neuromuscular re-education, Disease management/prevention, Medical illustrator training, DME/adaptive equipment instruction, Pain management, Splinting/orthotics, UE/LE Strength taining/ROM, UE/LE Coordination activities, Stair training, Patient/family education, Community reintegration  OT Interventions Training and development officer, Discharge planning, Self Care/advanced ADL retraining, UE/LE Coordination activities, Therapeutic Activities, Functional mobility training, Patient/family education, Therapeutic Exercise, DME/adaptive equipment instruction, Neuromuscular re-education, UE/LE Strength taining/ROM, Disease mangement/prevention  SLP Interventions    TR Interventions    SW/CM Interventions Discharge Planning, Psychosocial Support, Patient/Family Education   Barriers to Discharge MD  Medical stability and Wound care  Nursing      PT Decreased caregiver support, Lack of/limited family support husband with baseline cognitive deficits due to multiple CVAs, daughter only available intermittently  OT      SLP      SW Decreased caregiver support Caregiver for husband who has a past history of CVA   Team Discharge Planning: Destination: PT-Home ,OT- Home , SLP-Home Projected Follow-up: PT-Home health PT, 24 hour supervision/assistance, OT-  Home health OT, SLP-None Projected Equipment Needs: PT-To be determined, OT-  , SLP-None recommended by SLP Equipment Details: PT-has no equipment per pt report, OT-TBD Patient/family involved in discharge planning: PT- Patient,  OT-Patient, SLP-Patient, Family member/caregiver  MD ELOS: 7-11 days. Medical Rehab Prognosis:  Excellent Assessment: 77 year old right-handed female with history of Hashimoto's disease, diabetes mellitus. Per chart review, patient, and husband, patient lives with spouse  reported to be independent prior to admission.  Presented 12/01/2017 with recent work-up  for hyponatremia as well as syncope.  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  Recent MRI and imaging revealed broad-based disc protrusion C3-4, 4-5 and C5-6 with cervical stenosis myelopathy.  Underwent discectomy C3-4, 4-5 and 5-6 for decompression of spinal cord existing nerve roots 12/06/2017.  Hospital course pain management.  Hard cervical collar in place.  Sodium level stable 129-131 and her HCTZ was held.  TSH levels were normal.  No evidence of adrenal insufficiency. Bouts of hypokalemia with supplement added.  Echocardiogram with ejection fraction of 65% no wall motion abnormalities.  Patient with resulting functional deficits with mobility, transfers, self-care.  Wil set goals for Mod I with PT/OT.  See Team Conference Notes for weekly updates to the plan of care

## 2017-12-10 NOTE — Care Management Important Message (Signed)
Important Message  Patient Details  Name: Regina [name] MRN: [id number] Date of Birth: 10/29/1940   Medicare Important Message Given:  Yes    [name] 12/10/2017, 3:30 PM

## 2017-12-10 NOTE — PMR Pre-admission (Signed)
PMR Admission Coordinator Pre-Admission Assessment  Patient: Regina [name] is an 77 y.o., female MRN: [id number] DOB: 1940-07-19 Height: 5\' 2"  (157.5 cm) Weight: 72.8 kg              Insurance Information HMO:     PPO:      PCP:      IPA:      80/20:      OTHER: no HMO PRIMARY: Medicare part a only      Policy#: [id number]      Subscriber: pt Benefits:  Phone #: passport one online     Name: 12/07/17 Eff. Date: 08/01/2005     Deduct: $1364      Out of Pocket Max: none      Life Max: none CIR: 100%      SNF: 20 full days Outpatient: 80%     Co-Pay: 20% Home Health: 100%      Co-Pay: none DME: 80%     Co-Pay: 20% Providers: pt choice  SECONDARY: Regina [name]      Policy#: [id number]      Subscriber: pt  Medicaid Application Date:       Case Manager:  Disability Application Date:       Case Worker:   Emergency Contact Information Contact Information    Name Relation Home Work Mobile   [place] Daughter [phone]     Hults,Louis Spouse [phone]       Current Medical History  Patient Admitting Diagnosis: cervical stenosis/spondylosis with myelopathy  History of Present Illness: Regina [name] is a 77 year old right-handed female with history of Hashimoto's disease, diabetes mellitus. Presented 12/01/2017 with recent work-up for hyponatremia as well as syncope.  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  Recent MRI and imaging revealed broad-based disc protrusion C3-4, 4-5 and C5-6 with cervical stenosis myelopathy.  Underwent discectomy C3-4, 4-5 and 5-6 for decompression of spinal cord existing nerve roots 12/06/2017.  Hospital course pain management.  Hard cervical collar in place.  Sodium level stable 129-131 and her HCTZ was held.  TSH levels were normal.  No evidence of adrenal insufficiency.. Bouts of hypokalemia with supplement added.  Echocardiogram with ejection fraction of 65% no wall motion  abnormalities.  Tolerating a regular diet.   Past Medical History  Past Medical History:  Diagnosis Date  . Anxiety   . Arthritis    neck  . Asthma   . Cervical spondylolysis   . Cervical spondylolysis   . Depression   . Hashimoto's disease   . Insulin resistance   . PONV (postoperative nausea and vomiting)    hasn't had surgery since 1993    Family History  family history is not on file.  Prior Rehab/Hospitalizations:  Has the patient had major surgery during 100 days prior to admission? No  Current Medications   Current Facility-Administered Medications:  .  0.9 %  sodium chloride infusion, 250 mL, Intravenous, Continuous, Nundkumar, Nena Polio, MD, Last Rate: 1 mL/hr at 12/08/17 1113, 1,000 mL at 12/08/17 1113 .  acetaminophen (TYLENOL) tablet 1,000 mg, 1,000 mg, Oral, Q6H PRN, [name], MD, 1,000 mg at 12/09/17 0925 .  albuterol (PROVENTIL) (2.5 MG/3ML) 0.083% nebulizer solution 3 mL, 3 mL, Inhalation, Q6H PRN, [name], MD, 3 mL at 12/04/17 2237 .  ALPRAZolam Duanne Moron) tablet 0.25 mg, 0.25 mg, Oral, TID PRN, [name], MD, 0.25 mg at 12/10/17 9563 .  alum & mag hydroxide-simeth (MAALOX/MYLANTA) 200-200-20 MG/5ML suspension 30 mL, 30 mL, Oral,  Q4H PRN, Purohit, Shrey C, MD, 30 mL at 12/09/17 2259 .  bisacodyl (DULCOLAX) suppository 10 mg, 10 mg, Rectal, Daily PRN, [name], MD .  chlorhexidine (HIBICLENS) 4 % liquid, , Topical, Once, [name], MD .  docusate (COLACE) 50 MG/5ML liquid 100 mg, 100 mg, Oral, BID, Purohit, Shrey C, MD, 100 mg at 12/10/17 0841 .  escitalopram (LEXAPRO) tablet 10 mg, 10 mg, Oral, QHS, [name], MD, 10 mg at 12/09/17 2051 .  fluticasone (FLONASE) 50 MCG/ACT nasal spray 1 spray, 1 spray, Each Nare, Daily, [name], MD, 1 spray at 12/10/17 0842 .  hydrALAZINE (APRESOLINE) tablet 50 mg, 50 mg, Oral, Q8H, Purohit, Shrey C, MD, 50 mg at 12/10/17 0610 .  levothyroxine (SYNTHROID, LEVOTHROID)  tablet 75 mcg, 75 mcg, Oral, QAC breakfast, [name], MD, 75 mcg at 12/10/17 0841 .  menthol-cetylpyridinium (CEPACOL) lozenge 3 mg, 1 lozenge, Oral, PRN, 3 mg at 12/08/17 0239 **OR** phenol (CHLORASEPTIC) mouth spray 1 spray, 1 spray, Mouth/Throat, PRN, [name], MD .  metaxalone [facility]) tablet 800 mg, 800 mg, Oral, TID PRN, [name], MD, 800 mg at 12/10/17 0643 .  montelukast (SINGULAIR) tablet 10 mg, 10 mg, Oral, QHS, [name], MD, 10 mg at 12/09/17 2051 .  ondansetron (ZOFRAN) tablet 4 mg, 4 mg, Oral, Q6H PRN **OR** ondansetron (ZOFRAN) injection 4 mg, 4 mg, Intravenous, Q6H PRN, [name], MD .  oxyCODONE (Oxy IR/ROXICODONE) immediate release tablet 10 mg, 10 mg, Oral, Q3H PRN, [name], MD, 10 mg at 12/08/17 1501 .  oxyCODONE (Oxy IR/ROXICODONE) immediate release tablet 5 mg, 5 mg, Oral, Q3H PRN, [name], MD, 5 mg at 12/09/17 2309 .  pantoprazole (PROTONIX) EC tablet 40 mg, 40 mg, Oral, BID, Kary Kos, MD, 40 mg at 12/10/17 0841 .  polyethylene glycol (MIRALAX / GLYCOLAX) packet 17 g, 17 g, Oral, Daily PRN, [name], MD .  senna (SENOKOT) tablet 8.6 mg, 1 tablet, Oral, BID, [name], MD, 8.6 mg at 12/09/17 2052 .  sodium chloride flush (NS) 0.9 % injection 3 mL, 3 mL, Intravenous, Q12H, [name], MD, 3 mL at 12/10/17 0841 .  sodium chloride flush (NS) 0.9 % injection 3 mL, 3 mL, Intravenous, PRN, [name], MD .  sodium chloride tablet 2 g, 2 g, Oral, TID WC, Purohit, Shrey C, MD, 2 g at 12/10/17 0841 .  sodium phosphate (FLEET) 7-19 GM/118ML enema 1 enema, 1 enema, Rectal, Once PRN, [name], MD  Patients Current Diet:  Diet Order            Diet - low sodium heart healthy        Diet regular Room service appropriate? 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 None of the above  Prior Functional Level Prior Function Level of Independence: Independent  Self Care: Did the patient need help bathing, dressing, using the toilet or eating?  Independent  Indoor Mobility: Did the patient need assistance  with walking from room to room (with or without device)? Independent  Stairs: Did the patient need assistance with internal or external stairs (with or without device)? Independent  Functional Cognition: Did the patient need help planning regular tasks such as shopping or remembering to take medications? Independent  Current Functional Level Cognition  Overall Cognitive Status: Within Functional Limits for tasks assessed Orientation Level: Oriented X4    Extremity Assessment (includes Sensation/Coordination)  Upper Extremity Assessment: RUE deficits/detail, LUE deficits/detail RUE Deficits / Details: 3/5 elbow and distal, 2+/5 at shoulder level. Pt reports being able to hold and drink from lidded cup with straw which she was not able to do just prior to surgery. Struggled with reaching soap and paper towel dispensers. Using whole hand movement to turn faucet on and off.  RUE Sensation: decreased light touch RUE Coordination: decreased fine motor, decreased gross motor LUE Deficits / Details: 3/5 elbow and distal, 2+/5 at shoulder level. Struggled with reaching soap and paper towel dispensers. Using whole hand movement to turn faucet on and off.  LUE Sensation: decreased light touch LUE Coordination:  decreased fine motor, decreased gross motor  Lower Extremity Assessment: Defer to PT evaluation    ADLs  Overall ADL's : Needs assistance/impaired Eating/Feeding: Sitting, Set up, With adaptive utensils, Minimal assistance Eating/Feeding Details (indicate cue type and reason): pt reports difficulty grasping red tubing on fork. able to hold cup and drink from straw Grooming: Moderate assistance, Standing Grooming Details (indicate cue type and reason): min guard for balance. assist due to decreased [facility] and gross motor Upper Body Bathing: Moderate assistance, Sitting Lower Body Bathing: Sit to/from stand, Maximal assistance Upper Body Dressing : Moderate assistance, Sitting Upper Body Dressing Details (indicate cue type and reason): putting on gown as a robe Lower Body Dressing: Maximal assistance, Sit to/from stand Lower Body Dressing Details (indicate cue type and reason): Mod A sit<>stand Toilet Transfer: Min guard, Ambulation, RW Toilet Transfer Details (indicate cue type and reason): side step 3 steps up towards [place] with Bil HHA Toileting- Clothing Manipulation and Hygiene: Minimal assistance, Sit to/from stand Toileting - Clothing Manipulation Details (indicate cue type and reason): Mod A sit<>stand Functional mobility during ADLs: Min guard, Rolling walker General ADL Comments: Close min guard for functional mobility. Completed toilet transfer and grooming task. Assist to powerup at times during transfers and assist for gross and [facility] tasks.    Mobility  Overal bed mobility: Needs Assistance Bed Mobility: Sit to Sidelying Rolling: Min assist(VCs for sequencing) Sidelying to sit: Mod assist(HOB flat and use of rail, A for legs and up with trunk) Supine to sit: Mod assist Sit to supine: Min assist Sit to sidelying: Mod assist(A for legs) General bed mobility comments: pt in chair on arrival    Transfers  Overall transfer level: Needs assistance Equipment used: Rolling walker (2  wheeled) Transfers: Sit to/from Stand Sit to Stand: Min assist General transfer comment: min assist for stability when powering up to standing. VCs for hand placement and positioning prior to elevation to upright    Ambulation / Gait / Stairs / Wheelchair Mobility  Ambulation/Gait Ambulation/Gait assistance: Herbalist (Feet): 70 Feet Assistive device: 1 person hand held assist Gait Pattern/deviations: Step-through pattern, Decreased step length - right, Decreased weight shift to right, Decreased dorsiflexion - right, Decreased stride length, Wide base of support, Antalgic General Gait Details: Patient required HHA for stability with wrap around support. Max multi modal cues for posture and pacing. ocassional LOB noted Gait velocity:  decreased Gait velocity interpretation: <1.31 ft/sec, indicative of household ambulator    Posture / Balance Balance Overall balance assessment: Needs assistance Sitting-balance support: No upper extremity supported, Feet supported Sitting balance-Leahy Scale: Fair Standing balance support: Bilateral upper extremity supported Standing balance-Leahy Scale: Poor Standing balance comment: Hands on assist for static standing via external support    Special needs/care consideration BiPAP/CPAP n/a CPM n/a Continuous Drip IV n/a Dialysis n/a Life Vest n/a Oxygen n/a Special Bed n/a Trach Size n/a Wound Vac n/a Skin surgical incision Bowel mgmt: continent LBM 9/7 Bladder mgmt: continent Diabetic mgmt yes   Previous Home Environment Living Arrangements: Spouse/significant other, Children  Lives With: Spouse Available Help at Discharge: (spouse available 24/7; daughter 53 year old Pharmacist, hospital) Type of Home: House Home Layout: One level Home Access: Stairs to enter Entrance Stairs-Rails: None Entrance Stairs-Number of Steps: threshold Bathroom Shower/Tub: Multimedia programmer: Standard Bathroom Accessibility: Yes How Accessible:  Accessible via walker Home Care Services: No Additional Comments: Wife is caregiver for husband who has had stroke. Mostly cognitive side and no physical assist.   Discharge Living Setting Plans for Discharge Living Setting: Patient's home, Lives with (comment)(spouse) Type of Home at Discharge: House Discharge Home Layout: One level Discharge Home Access: Stairs to enter Entrance Stairs-Rails: None Entrance Stairs-Number of Steps: threshold Discharge Bathroom Shower/Tub: Walk-in shower Discharge Bathroom Toilet: Standard Does the patient have any problems obtaining your medications?: No  Social/Family/Support Systems Patient Roles: Spouse, Parent, Risk analyst Information: Santiago Glad, daughter Anticipated Caregiver: spouse and daughter Anticipated Caregiver's Contact Information: see above Ability/Limitations of Caregiver: spouse has had multiple CVAs, cognition is his issue Caregiver Availability: 24/7 Discharge Plan Discussed with Primary Caregiver: Yes Is Caregiver In Agreement with Plan?: Yes Does Caregiver/Family have Issues with Lodging/Transportation while Pt is in Rehab?: No   Patient's spouse history of CVA. Patient states she is his caregiver. Neighbor transports him to hospital daily and then daughter picks him up after work and he then goes home. He is very ambulatory and appropriate at bedside.  Goals/Additional Needs Patient/Family Goal for Rehab: Mod I with PT, OT, and SLP Expected length of stay: ELOS 7 to 10 days Additional Information: Pt's 42 year old sister, Gay Filler, is coming on 9/19 to assist patient in her care at home Pt/Family Agrees to Admission and willing to participate: Yes Program Orientation Provided & Reviewed with Pt/Caregiver Including Roles  & Responsibilities: Yes  Decrease burden of Care through IP rehab admission: n/a  Possible need for SNF placement upon discharge: not anticipated  Patient Condition: This patient's medical and functional  status has changed since the consult dated: 12/07/2017 in which the Rehabilitation Physician determined and documented that the patient's condition is appropriate for intensive rehabilitative care in an inpatient rehabilitation facility. See "History of Present Illness" (above) for medical update. Functional changes are: min to mod assist. Patient's medical and functional status update has been discussed with the Rehabilitation physician and patient remains appropriate for inpatient rehabilitation. Will admit to inpatient rehab today.  Preadmission Screen Completed By:  Cleatrice Burke, 12/10/2017 11:50 AM ______________________________________________________________________   Discussed status with Dr. Posey Pronto on 12/10/2017 at  1157 and received telephone approval for admission today.  Admission Coordinator:  Cleatrice Burke, time [id number] Date 12/10/2017

## 2017-12-10 NOTE — Progress Notes (Addendum)
Inpatient Rehabilitation Admissions Coordinator  I have a bed available to admit pt to inpt rehab today. I met with pt and her spouse at bedside and they are in agreement to admit. I will contact Dr. Herbert Moors to make the arrangements. RN is aware. RN CM, Nira Conn, and SW, [place],  made aware of plan  Danne Baxter, RN, MSN Rehab Admissions Coordinator [phone] 12/10/2017 10:48 AM

## 2017-12-10 NOTE — H&P (Signed)
Physical Medicine and Rehabilitation Admission H&P    Chief Complaint  Patient presents with  . Loss of Consciousness  : HPI: Regina [name] is a 77 year old right-handed female with history of Hashimoto's disease, diabetes mellitus. Per chart review, patient, and husband, patient lives with spouse reported to be independent prior to admission.  One level home.  Husband with history of CVA and limited physically.  She has a daughter in the area that works.  Presented 12/01/2017 with recent work-up for hyponatremia as well as syncope.  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  Recent MRI and imaging revealed broad-based disc protrusion C3-4, 4-5 and C5-6 with cervical stenosis myelopathy.  Underwent discectomy C3-4, 4-5 and 5-6 for decompression of spinal cord existing nerve roots 12/06/2017.  Hospital course pain management.  Hard cervical collar in place.  Sodium level stable 129-131 and her HCTZ was held.  TSH levels were normal.  No evidence of adrenal insufficiency. Bouts of hypokalemia with supplement added.  Echocardiogram with ejection fraction of 65% no wall motion abnormalities.  Tolerating a regular diet.  Therapy evaluations completed.  Patient was admitted for a comprehensive rehab program.  Review of Systems  Constitutional: Negative for chills and fever.  HENT: Negative for hearing loss.   Eyes: Negative for blurred vision and double vision.  Respiratory: Negative for cough and shortness of breath.   Cardiovascular: Negative for chest pain, palpitations and leg swelling.  Gastrointestinal: Positive for constipation. Negative for nausea and vomiting.  Genitourinary: Negative for dysuria, flank pain and hematuria.  Musculoskeletal: Positive for joint pain and myalgias.  Skin: Negative for rash.  Neurological: Positive for tingling, sensory change and weakness.  Psychiatric/Behavioral: Positive for depression.       Anxiety  All  other systems reviewed and are negative.  Past Medical History:  Diagnosis Date  . Anxiety   . Arthritis    neck  . Asthma   . Cervical spondylolysis   . Cervical spondylolysis   . Depression   . Hashimoto's disease   . Insulin resistance   . PONV (postoperative nausea and vomiting)    hasn't had surgery since 1993   Past Surgical History:  Procedure Laterality Date  . APPENDECTOMY    . BREAST SURGERY     left biopsy  . CESAREAN SECTION     x2  . CHOLECYSTECTOMY    . DENTAL SURGERY     teeth removal  . OVARY SURGERY     ovarian wedge  . TONSILLECTOMY     History reviewed. No pertinent family history of cervical myelopathy. Social History:  reports that she quit smoking about 37 years ago. She has never used smokeless tobacco. She reports that she drinks alcohol. She reports that she does not use drugs. Allergies:  Allergies  Allergen Reactions  . Ceclor [Cefaclor] Rash  . Naproxen Hives and Swelling    Mouth swelling  . Sulfa Antibiotics Nausea And Vomiting and Other (See Comments)    Syncope/headaches   . Amlodipine Cough  . Augmentin [Amoxicillin-Pot Clavulanate] Nausea And Vomiting    Has patient had a PCN reaction causing immediate rash, facial/tongue/throat swelling, SOB or lightheadedness with hypotension: No Has patient had a PCN reaction causing severe rash involving mucus membranes or skin necrosis: No Has patient had a PCN reaction that required hospitalization: No Has patient had a PCN reaction occurring within the last 10 years: No If all of the above answers are "NO", then may proceed  with Cephalosporin use.   . Other Nausea And Vomiting    "pain medications" or opoids/Anesthesia  . Valsartan Cough   Medications Prior to Admission  Medication Sig Dispense Refill  . acetaminophen (TYLENOL) 500 MG tablet Take 1,000 mg by mouth every 6 (six) hours as needed (for headaches.).    Marland Kitchen albuterol (PROVENTIL HFA;VENTOLIN HFA) 108 (90 Base) MCG/ACT inhaler  Inhale 1-2 puffs into the lungs every 6 (six) hours as needed for wheezing or shortness of breath (asthma/coughing).    . ALPRAZolam (XANAX) 0.25 MG tablet Take 0.25 mg by mouth 3 (three) times daily.     . carbamide peroxide (DEBROX) 6.5 % OTIC solution Place 5 drops into both ears daily as needed (wax build up).    . CHERATUSSIN AC 100-10 MG/5ML syrup Take 5 mLs by mouth 4 (four) times daily. For 5 days.  0  . diphenhydrAMINE (BENADRYL) 25 MG tablet Take 25 mg by mouth every 6 (six) hours as needed for allergies.    Marland Kitchen escitalopram (LEXAPRO) 10 MG tablet Take 10 mg by mouth at bedtime.    . fexofenadine (ALLEGRA) 180 MG tablet Take 180 mg by mouth daily.    Marland Kitchen ibuprofen (ADVIL,MOTRIN) 200 MG tablet Take 600 mg by mouth every 8 (eight) hours as needed (for pain).    Marland Kitchen levothyroxine (SYNTHROID) 75 MCG tablet Take 75 mcg by mouth daily before breakfast.    . metaxalone (SKELAXIN) 800 MG tablet Take 800 mg by mouth 3 (three) times daily as needed for muscle spasms.    . metFORMIN (GLUCOPHAGE) 850 MG tablet Take 850 mg by mouth 2 (two) times daily.     . montelukast (SINGULAIR) 10 MG tablet Take 10 mg by mouth at bedtime.  3  . triamcinolone cream (KENALOG) 0.1 % Apply 1 application topically as needed (once a week).    . triamterene-hydrochlorothiazide (MAXZIDE-25) 37.5-25 MG tablet Take 1 tablet by mouth every morning.   11  . APPLE CIDER VINEGAR PO Take 1 capsule by mouth daily with lunch.    Marland Kitchen aspirin EC 325 MG tablet Take 325 mg by mouth at bedtime.      Drug Regimen Review Drug regimen was reviewed and remains appropriate with no significant issues identified  Home: Home Living Family/patient expects to be discharged to:: Inpatient rehab Living Arrangements: Spouse/significant other, Children Available Help at Discharge: Family, Available 24 hours/day Type of Home: House Home Access: Stairs to enter [employer] of Steps: threshold Entrance Stairs-Rails: None Home Layout: One  level Bathroom Shower/Tub: Multimedia programmer: Standard Home Equipment: None Additional Comments: Wife is caregiver for husband who has had stroke. Mostly cognitive side and no physical assist.    Functional History: Prior Function Level of Independence: Independent  Functional Status:  Mobility: Bed Mobility Overal bed mobility: Needs Assistance Bed Mobility: Rolling, Sidelying to Sit, Sit to Sidelying Rolling: Min assist(VCs for sequencing) Sidelying to sit: Mod assist(HOB flat and use of rail, A for legs and up with trunk) Supine to sit: Mod assist Sit to supine: Min assist Sit to sidelying: Mod assist(A for legs) General bed mobility comments: pt in chair Transfers Overall transfer level: Needs assistance Equipment used: Rolling walker (2 wheeled) Transfers: Sit to/from Stand Sit to Stand: Min assist, Mod assist General transfer comment: mod A on initial stand, min guard thereafter Ambulation/Gait General Gait Details: Deferred     ADL: ADL Overall ADL's : Needs assistance/impaired Eating/Feeding: Sitting, Set up, With adaptive utensils, Minimal assistance Eating/Feeding Details (indicate  cue type and reason): pt reports difficulty grasping red tubing on fork. able to hold cup and drink from straw Grooming: Moderate assistance, Standing Grooming Details (indicate cue type and reason): min guard for balance. assist due to decreased [facility] and gross motor Upper Body Bathing: Moderate assistance, Sitting Lower Body Bathing: Sit to/from stand, Maximal assistance Upper Body Dressing : Moderate assistance, Sitting Upper Body Dressing Details (indicate cue type and reason): putting on gown as a robe Lower Body Dressing: Maximal assistance, Sit to/from stand Lower Body Dressing Details (indicate cue type and reason): Mod A sit<>stand Toilet Transfer: Min guard, Ambulation, RW Toilet Transfer Details (indicate cue type and reason): side step 3 steps up towards [place]  with Bil HHA Toileting- Clothing Manipulation and Hygiene: Minimal assistance, Sit to/from stand Toileting - Clothing Manipulation Details (indicate cue type and reason): Mod A sit<>stand Functional mobility during ADLs: Min guard, Rolling walker General ADL Comments: Close min guard for functional mobility. Completed toilet transfer and grooming task. Assist to powerup at times during transfers and assist for gross and [facility] tasks.  Cognition: Cognition Overall Cognitive Status: Within Functional Limits for tasks assessed Orientation Level: Oriented X4 Cognition Arousal/Alertness: Awake/alert Behavior During Therapy: WFL for tasks assessed/performed Overall Cognitive Status: Within Functional Limits for tasks assessed  Physical Exam: Blood pressure (!) 116/46, pulse 67, temperature 98.3 F (36.8 C), temperature source Oral, resp. rate 13, height _0  (1.575 m), weight 72.8 kg, SpO2 97 %. Physical Exam  Vitals reviewed. Constitutional: She is oriented to person, place, and time. She appears well-developed and well-nourished.  HENT:  Head: Normocephalic and atraumatic.  Eyes: EOM are normal. Right eye exhibits no discharge. Left eye exhibits no discharge.  Neck:  Cervical collar in place  Cardiovascular: Normal rate, regular rhythm and normal heart sounds.  Respiratory: Effort normal and breath sounds normal. No respiratory distress.  GI: Soft. Bowel sounds are normal. She exhibits no distension.  Musculoskeletal:  No edema or tenderness in extremities  Neurological: She is alert and oriented to person, place, and time.  Follows full commands Motor: Bilateral upper extremities: Shoulder flexion, elbow flexion/extension, wrist extension 4/5, hand grip 4 -/5 Bilateral lower extremities:4+/5 proximal distal Sensation diminished light touch bilateral hands  Skin: Skin is warm and dry.  Psychiatric: She has a normal mood and affect. Her behavior is normal.    Results for orders  placed or performed during the hospital encounter of 12/01/17 (from the past 48 hour(s))  Glucose, capillary     Status: Abnormal   Collection Time: 12/05/17  5:41 PM  Result Value Ref Range   Glucose-Capillary 127 (H) 70 - 99 mg/dL  Renal function panel     Status: Abnormal   Collection Time: 12/06/17  4:30 AM  Result Value Ref Range   Sodium 134 (L) 135 - 145 mmol/L   Potassium 4.8 3.5 - 5.1 mmol/L    Comment: DELTA CHECK NOTED   Chloride 100 98 - 111 mmol/L   CO2 27 22 - 32 mmol/L   Glucose, Bld 94 70 - 99 mg/dL   BUN 11 8 - 23 mg/dL   Creatinine, Ser 0.93 0.44 - 1.00 mg/dL   Calcium 8.7 (L) 8.9 - 10.3 mg/dL   Phosphorus 4.1 2.5 - 4.6 mg/dL   Albumin 3.1 (L) 3.5 - 5.0 g/dL   GFR calc non Af Amer 58 (L) >60 mL/min   GFR calc Af Amer >60 >60 mL/min    Comment: (NOTE) The eGFR has been calculated using the  CKD EPI equation. This calculation has not been validated in all clinical situations. eGFR's persistently <60 mL/min signify possible Chronic Kidney Disease.    Anion gap 7 5 - 15    Comment: Performed at [facility] [address], [place], [place] [address]  Magnesium     Status: None   Collection Time: 12/06/17  4:30 AM  Result Value Ref Range   Magnesium 1.9 1.7 - 2.4 mg/dL    Comment: Performed at [facility] [address] [place], [place] [address]  Surgical PCR screen     Status: None   Collection Time: 12/06/17 12:42 PM  Result Value Ref Range   MRSA, PCR NEGATIVE NEGATIVE   Staphylococcus aureus NEGATIVE NEGATIVE    Comment: (NOTE) The Xpert SA Assay (FDA approved for NASAL specimens in patients 60 years of age and older), is one component of a comprehensive surveillance program. It is not intended to diagnose infection nor to guide or monitor treatment. Performed at [facility], [place] [address], [place], [place] [address]   Comprehensive metabolic panel     Status: Abnormal   Collection Time: 12/07/17  3:38 AM  Result Value Ref  Range   Sodium 130 (L) 135 - 145 mmol/L   Potassium 4.0 3.5 - 5.1 mmol/L   Chloride 97 (L) 98 - 111 mmol/L   CO2 24 22 - 32 mmol/L   Glucose, Bld 120 (H) 70 - 99 mg/dL   BUN 9 8 - 23 mg/dL   Creatinine, Ser 0.82 0.44 - 1.00 mg/dL   Calcium 8.4 (L) 8.9 - 10.3 mg/dL   Total Protein 5.0 (L) 6.5 - 8.1 g/dL   Albumin 2.9 (L) 3.5 - 5.0 g/dL   AST 16 15 - 41 U/L   ALT 13 0 - 44 U/L   Alkaline Phosphatase 44 38 - 126 U/L   Total Bilirubin 0.8 0.3 - 1.2 mg/dL   GFR calc non Af Amer >60 >60 mL/min   GFR calc Af Amer >60 >60 mL/min    Comment: (NOTE) The eGFR has been calculated using the CKD EPI equation. This calculation has not been validated in all clinical situations. eGFR's persistently <60 mL/min signify possible Chronic Kidney Disease.    Anion gap 9 5 - 15    Comment: Performed at [facility] [address], [place], [place] [address]  CBC     Status: Abnormal   Collection Time: 12/07/17  3:38 AM  Result Value Ref Range   WBC 10.6 (H) 4.0 - 10.5 K/uL   RBC 4.04 3.87 - 5.11 MIL/uL   Hemoglobin 11.5 (L) 12.0 - 15.0 g/dL   HCT 34.7 (L) 36.0 - 46.0 %   MCV 85.9 78.0 - 100.0 fL   MCH 28.5 26.0 - 34.0 pg   MCHC 33.1 30.0 - 36.0 g/dL   RDW 14.8 11.5 - 15.5 %   Platelets 348 150 - 400 K/uL    Comment: Performed at [facility], [place] [address], [place],  [address]   Dg Cervical Spine 1 View  Result Date: 12/06/2017 CLINICAL DATA:  ACDF of C3-4, C4-5, C5-6.  [place] OR-19. EXAM: DG CERVICAL SPINE - 1 VIEW; DG C-ARM 61-120 MIN COMPARISON:  None. FINDINGS: Single lateral intraoperative fluoroscopic spot image of the cervical spine is provided. Anterior cervical fusion hardware appears appropriately positioned at the C3 through C6 levels, with intervening disc grafts. Hardware appears intact. Fluoroscopy was provided for 3 seconds.  IMPRESSION: Intraoperative fluoroscopic spot image of the cervical spine demonstrating anterior cervical fusion hardware at the C3 through  C6 levels. No evidence of surgical complicating feature. Electronically Signed   By: Franki Cabot M.D.   On: 12/06/2017 17:30   Dg C-arm 1-60 Min  Result Date: 12/06/2017 CLINICAL DATA:  ACDF of C3-4, C4-5, C5-6.  [place] OR-19. EXAM: DG CERVICAL SPINE - 1 VIEW; DG C-ARM 61-120 MIN COMPARISON:  None. FINDINGS: Single lateral intraoperative fluoroscopic spot image of the cervical spine is provided. Anterior cervical fusion hardware appears appropriately positioned at the C3 through C6 levels, with intervening disc grafts. Hardware appears intact. Fluoroscopy was provided for 3 seconds. IMPRESSION: Intraoperative fluoroscopic spot image of the cervical spine demonstrating anterior cervical fusion hardware at the C3 through C6 levels. No evidence of surgical complicating feature. Electronically Signed   By: Franki Cabot M.D.   On: 12/06/2017 17:30       Medical Problem List and Plan: 1.  Decreased functional mobility secondary to cervical stenosis/spondylosis/myelopathy.  Status post discectomy C3-4, 4-5 and 5-6 for decompression of spinal cord and existing nerve roots 12/06/2017.  Cervical collar 2.  DVT Prophylaxis/Anticoagulation: SCDs 3. Pain Management: Skelaxin and oxycodone as needed 4. Mood: Lexapro 10 mg nightly, Xanax 0.25 mg 3 times daily as needed 5. Neuropsych: This patient is capable of making decisions on her own behalf. 6. Skin/Wound Care: Routine skin checks 7. Fluids/Electrolytes/Nutrition: Routine in and outs with follow-up chemistries 8. Hyponatremia/hypokalemia.  Stabilizing.  HCTZ held.  Follow-up chemistries.  Continue sodium chloride tablets 9.  Bouts of syncope.  Monitor blood pressure when out of bed.  Echocardiogram with ejection fraction of 65%. 10.  Hypertension.  Hydralazine 50 mg every 8 hours 11.  History of diabetes mellitus.   Patient on Glucophage 850 mg twice daily prior to admission. Check CBG ac/HS. Resume as needed 12.  Hypothyroidism.  Synthroid 13.   Asthma/COPD.  Continue nebulizers and Singulair 14.  Constipation.  Laxative assistance   Post Admission Physician Evaluation: 1. Preadmission assessment reviewed and changes made below. 2. Functional deficits secondary  to cervical stenosis/spondylosis/myelopathy. 3. Patient is admitted to receive collaborative, interdisciplinary care between the physiatrist, rehab nursing staff, and therapy team. 4. Patient's level of medical complexity and substantial therapy needs in context of that medical necessity cannot be provided at a lesser intensity of care such as a SNF. 5. Patient has experienced substantial functional loss from his/her baseline which was documented above under the "Functional History" and "Functional Status" headings.  Judging by the patient's diagnosis, physical exam, and functional history, the patient has potential for functional progress which will result in measurable gains while on inpatient rehab.  These gains will be of substantial and practical use upon discharge  in facilitating mobility and self-care at the household level. 6. Physiatrist will provide 24 hour management of medical needs as well as oversight of the therapy plan/treatment and provide guidance as appropriate regarding the interaction of the two. 7. 24 hour rehab nursing will assist with bladder management, safety, skin/wound care, disease management, pain management and patient education  and help integrate therapy concepts, techniques,education, etc. 8. PT will assess and treat for/with: Lower extremity strength, range of motion, stamina, balance, functional mobility, safety, adaptive techniques and equipment, wound care, coping skills, pain control, education. Goals are: supervision. 9. OT will assess and treat for/with: ADL's, functional mobility, safety, upper extremity strength, adaptive techniques and equipment, wound mgt, ego support, and community reintegration.   Goals are:  supervision. Therapy may not  proceed with showering this patient. 10. Case Management and Social Worker will assess and treat for psychological issues and discharge planning. 11. Team conference will be held weekly to assess progress toward goals and to determine barriers to discharge. 12. Patient will receive at least 3 hours of therapy per day at least 5 days per week. 13. ELOS: 8-12 days.       14. Prognosis:  good  I have personally performed a face to face diagnostic evaluation, including, but not limited to relevant history and physical exam findings, of this patient and developed relevant assessment and plan.  Additionally, I have reviewed and concur with the physician assistant's documentation above.   The patient's status has not changed. The original post admission physician evaluation remains appropriate, and any changes from the pre-admission screening or documentation from the acute chart are noted above.   Delice Lesch, MD, ABPMR [facility] Angiulli, PA-C 12/07/2017

## 2017-12-10 NOTE — Progress Notes (Addendum)
Neurosurgery Progress Note  No issues overnight. Complains of mild sinus congestion Dysphagia and dyspnea improved  EXAM:  BP (!) 143/63   Pulse 60   Temp 97.6 F (36.4 C) (Oral)   Resp 17   Ht 5\' 2"  (1.575 m)   Wt 72.8 kg   SpO2 97%   BMI 29.35 kg/m   Awake, alert, oriented  Speech fluent, appropriate  CN grossly intact  5/5 BUE/BLE, 4+/5 left grip, 4/5 right grip Incision: c/d/i  PLAN Doing well this am Plan for CIR when bed available  I have seen and examined Regina [name] and agree with the exam, impression, and plan as documented in the note by Ferne Reus, PA-C.  [name], MD [facility]

## 2017-12-10 NOTE — Progress Notes (Signed)
Pt being discharged to CIR via wheelchair with family. Pt alert and oriented x4. VSS. Pt c/o no pain at this time. No signs of respiratory distress. Education complete and care plans resolved. IV removed with catheter intact and pt tolerated well. No further issues at this time. Pt to follow up with PCP. Leanne Chang, RN

## 2017-12-10 NOTE — Progress Notes (Signed)
Meredith Staggers, MD    Meredith Staggers, MD  Physician  Physical Medicine and Rehabilitation      Consult Note  Signed     Date of Service:  12/07/2017  1:19 PM         Related encounter: ED to Hosp-Admission (Current) from 12/01/2017 in [place] All Collapse All            Expand widget buttonCollapse widget button    Show:Clear all   ManualTemplateCopied  Added by:     Angiulli, Lavon Paganini, PA-C  Meredith Staggers, MD   Hover for detailscustomization button                                                                                                                                                  untitled image              Physical Medicine and Rehabilitation Consult  Reason for Consult: Decreased functional mobility  Referring Physician: Internal medicine        HPI: Regina [name] is a 77 y.o. right-handed female with history of Hashimoto's disease, diabetes mellitus.  Per chart review patient lives with spouse reported to be independent prior to admission.  One level home.  Husband with history of CVA and limited physically.  Noted recent hospital admission for syncope and hyponatremia.  Presented 12/01/2017 with history of cervical stenosis and myelopathy followed outpatient by neurosurgery.  Recent MRI and imaging revealed broad-based disc protrusions C3-4, C4-5 and C5-6 with cervical stenosis and myelopathy.  Underwent discectomy C3-4, 4-5 and 5-6 for decompression of spinal cord and existing nerve roots 12/06/2017 per Dr. Kathyrn Sheriff.  Hospital course pain management-cervical collar.  Hospital course pain management.  Sodium stable 130.  Therapy evaluations completed with  recommendations of physical medicine rehab consult.        Review of Systems   Constitutional: Negative for chills and fever.   HENT: Negative for hearing loss.    Eyes: Negative for blurred vision and double vision.   Respiratory: Negative for cough and shortness of breath.    Cardiovascular: Negative for chest pain, palpitations and leg swelling.   Gastrointestinal: Positive for constipation. Negative for nausea and vomiting.   Genitourinary: Negative for dysuria, flank pain and hematuria.   Musculoskeletal: Positive for joint pain and myalgias.   Skin: Negative for rash.   Neurological: Positive for tingling and weakness.   Psychiatric/Behavioral: Positive for depression.        Anxiety   All other systems reviewed and are negative.  Past Medical History:    Diagnosis   Date    .   Anxiety        .   Arthritis            neck    .   Asthma        .   Cervical spondylolysis        .   Cervical spondylolysis        .   Depression        .   Hashimoto's disease        .   Insulin resistance        .   PONV (postoperative nausea and vomiting)            hasn't had surgery since 1993             Past Surgical History:    Procedure   Laterality   Date    .   APPENDECTOMY            .   BREAST SURGERY                left biopsy    .   CESAREAN SECTION                x2    .   CHOLECYSTECTOMY            .   DENTAL SURGERY                teeth removal    .   OVARY SURGERY                ovarian wedge    .   TONSILLECTOMY               History reviewed. No pertinent family history.  Social History:  reports that she quit smoking about 37 years ago. She has never used smokeless tobacco. She reports that she drinks alcohol. She reports that she does not use drugs.  Allergies:         Allergies     Allergen   Reactions    .   Ceclor [Cefaclor]   Rash    .   Naproxen   Hives and Swelling            Mouth swelling    .   Sulfa Antibiotics   Nausea And Vomiting and Other (See Comments)            Syncope/headaches     .   Amlodipine   Cough    .   Augmentin [Amoxicillin-Pot Clavulanate]   Nausea And Vomiting            Has patient had a PCN reaction causing immediate rash, facial/tongue/throat swelling, SOB or lightheadedness with hypotension: No  Has patient had a PCN reaction causing severe rash involving mucus membranes or skin necrosis: No  Has patient had a PCN reaction that required hospitalization: No  Has patient had a PCN reaction occurring within the last 10 years: No  If all of the above answers are "NO", then may proceed with Cephalosporin use.       .   Other   Nausea And Vomiting            "pain medications" or opoids/Anesthesia    .   Valsartan   Cough              Medications Prior  to Admission    Medication   Sig   Dispense   Refill    .   acetaminophen (TYLENOL) 500 MG tablet   Take 1,000 mg by mouth every 6 (six) hours as needed (for headaches.).            Marland Kitchen   albuterol (PROVENTIL HFA;VENTOLIN HFA) 108 (90 Base) MCG/ACT inhaler   Inhale 1-2 puffs into the lungs every 6 (six) hours as needed for wheezing or shortness of breath (asthma/coughing).            .   ALPRAZolam (XANAX) 0.25 MG tablet   Take 0.25 mg by mouth 3 (three) times daily.             .   carbamide peroxide (DEBROX) 6.5 % OTIC solution   Place 5 drops into both ears daily as needed (wax build up).            .   CHERATUSSIN AC 100-10 MG/5ML syrup   Take 5 mLs by mouth 4 (four) times daily. For 5 days.       0    .   diphenhydrAMINE (BENADRYL) 25 MG tablet   Take 25 mg by mouth every 6 (six) hours as needed for allergies.            Marland Kitchen   escitalopram  (LEXAPRO) 10 MG tablet   Take 10 mg by mouth at bedtime.            .   fexofenadine (ALLEGRA) 180 MG tablet   Take 180 mg by mouth daily.            Marland Kitchen   ibuprofen (ADVIL,MOTRIN) 200 MG tablet   Take 600 mg by mouth every 8 (eight) hours as needed (for pain).            Marland Kitchen   levothyroxine (SYNTHROID) 75 MCG tablet   Take 75 mcg by mouth daily before breakfast.            .   metaxalone (SKELAXIN) 800 MG tablet   Take 800 mg by mouth 3 (three) times daily as needed for muscle spasms.            .   metFORMIN (GLUCOPHAGE) 850 MG tablet   Take 850 mg by mouth 2 (two) times daily.             .   montelukast (SINGULAIR) 10 MG tablet   Take 10 mg by mouth at bedtime.       3    .   triamcinolone cream (KENALOG) 0.1 %   Apply 1 application topically as needed (once a week).            .   triamterene-hydrochlorothiazide (MAXZIDE-25) 37.5-25 MG tablet   Take 1 tablet by mouth every morning.        11    .   APPLE CIDER VINEGAR PO   Take 1 capsule by mouth daily with lunch.            Marland Kitchen   aspirin EC 325 MG tablet   Take 325 mg by mouth at bedtime.                  Home:  Home Living  Family/patient expects to be discharged to:: Inpatient rehab  Living Arrangements: Spouse/significant other, Children  Available Help at Discharge: Family, Available 24 hours/day  Type of Home: House  Home Access: Stairs to enter  Entrance Stairs-Number of Steps: threshold  Entrance Stairs-Rails: None  Home Layout: One level  Bathroom Shower/Tub: Tourist information centre manager: Standard  Home Equipment: None  Additional Comments: Wife is caregiver for husband who has had stroke. Mostly cognitive side and no physical assist.    Functional History:  Prior Function  Level of Independence: Independent  Functional Status:   Mobility:  Bed Mobility  Overal bed mobility: Needs  Assistance  Bed Mobility: Rolling, Sidelying to Sit, Sit to Sidelying  Rolling: Min assist(VCs for sequencing)  Sidelying to sit: Mod assist(HOB flat and use of rail, A for legs and up with trunk)  Supine to sit: Mod assist  Sit to supine: Min assist  Sit to sidelying: Mod assist(A for legs)  General bed mobility comments: pt in chair  Transfers  Overall transfer level: Needs assistance  Equipment used: Rolling walker (2 wheeled)  Transfers: Sit to/from Stand  Sit to Stand: Min assist, Mod assist  General transfer comment: mod A on initial stand, min guard thereafter  Ambulation/Gait  General Gait Details: Deferred        ADL:  ADL  Overall ADL's : Needs assistance/impaired  Eating/Feeding: Sitting, Set up, With adaptive utensils, Minimal assistance  Eating/Feeding Details (indicate cue type and reason): pt reports difficulty grasping red tubing on fork. able to hold cup and drink from straw  Grooming: Moderate assistance, Standing  Grooming Details (indicate cue type and reason): min guard for balance. assist due to decreased [facility] and gross motor  Upper Body Bathing: Moderate assistance, Sitting  Lower Body Bathing: Sit to/from stand, Maximal assistance  Upper Body Dressing : Moderate assistance, Sitting  Upper Body Dressing Details (indicate cue type and reason): putting on gown as a robe  Lower Body Dressing: Maximal assistance, Sit to/from stand  Lower Body Dressing Details (indicate cue type and reason): Mod A sit<>stand  Toilet Transfer: Min guard, Ambulation, RW  Toilet Transfer Details (indicate cue type and reason): side step 3 steps up towards [place] with Bil HHA  Toileting- Clothing Manipulation and Hygiene: Minimal assistance, Sit to/from stand  Toileting - Clothing Manipulation Details (indicate cue type and reason): Mod A sit<>stand  Functional mobility during ADLs: Min guard, Rolling walker  General ADL Comments: Close min guard for  functional mobility. Completed toilet transfer and grooming task. Assist to powerup at times during transfers and assist for gross and [facility] tasks.     Cognition:  Cognition  Overall Cognitive Status: Within Functional Limits for tasks assessed  Orientation Level: Oriented X4  Cognition  Arousal/Alertness: Awake/alert  Behavior During Therapy: WFL for tasks assessed/performed  Overall Cognitive Status: Within Functional Limits for tasks assessed     Blood pressure (!) 116/46, pulse 67, temperature 98.3 F (36.8 C), temperature source Oral, resp. rate 13, height 5\' 2"  (1.575 m), weight 72.8 kg, SpO2 97 %.  Physical Exam   Vitals reviewed.  Constitutional: She is oriented to person, place, and time. She appears well-developed. No distress.   HENT:   Head: Normocephalic and atraumatic.   Eyes: EOM are normal.   Neck:  c- collar   Cardiovascular: Normal rate.   Respiratory: Effort normal. No respiratory distress.   GI: Soft. She exhibits no distension.  Neurological: She is alert and oriented to person, place, and time. She exhibits normal muscle tone.  UE 3+ to 4/5. Decreased FMC of fingers/hands. LE: 3+/5 HF, 4/5 KE and ADF/PF. Senses pain and LT for the most part with some decrease in fingers.  Skin: Skin is warm.  Psychiatric: She has a normal mood and affect. Her behavior is normal. Judgment and thought content normal.         Lab Results Last 24 Hours                                                                                                                                                                                                                                                                                                                                                                                                                  Imaging Results (Last 48 hours)                                 Assessment/Plan:  Diagnosis: Cervical stenosis/spondylosis with myelopathy  1.Does the need for close, 24 hr/day medical supervision in concert with the patient's rehab needs make it unreasonable for this patient to be served in a less intensive setting? Yes   2.Co-Morbidities requiring supervision/potential complications: hyponatremia, dysphagia, pain mgt, post-op considerations   3.Due to bladder management, bowel management, safety, skin/wound care, disease management, medication administration, pain management and patient education, does the patient require 24 hr/day rehab nursing? Yes   4.Does the patient require coordinated care of a physician, rehab nurse, PT (1-2 hrs/day, 5 days/week) and OT (1-2 hrs/day, 5 days/week) to address physical and functional deficits in the context of the above medical diagnosis(es)? Yes Addressing deficits in the following areas: balance, endurance,  locomotion, strength, transferring, bowel/bladder control, bathing, dressing, feeding, grooming, toileting, psychosocial support and potentially swallowing   5.Can the patient actively participate in an intensive therapy program of at least 3 hrs of therapy per day at least 5 days per week? Yes   6.The potential for patient to make measurable gains while on inpatient rehab is excellent   7.Anticipated functional outcomes upon discharge from inpatient rehab are modified independent  with PT, modified independent with OT, modified independent with SLP.   8.Estimated rehab length of stay to reach the above functional goals is: 7-10 days   9.Anticipated D/C setting: Home   10.Anticipated post D/C treatments: HH therapy and Outpatient therapy   11.Overall  Rehab/Functional Prognosis: excellent      RECOMMENDATIONS:  This patient's condition is appropriate for continued rehabilitative care in the following setting: CIR  Patient has agreed to participate in recommended program. Yes  Note that insurance prior authorization may be required for reimbursement for recommended care.     Comment: Rehab Admissions Coordinator to follow up.     Thanks,     Meredith Staggers, MD, Mellody Drown     I have personally performed a face to face diagnostic evaluation of this patient. Additionally, I have reviewed and concur with the physician assistant's documentation above.       Lavon Paganini Angiulli, PA-C  12/07/2017                Revision History                                        Routing History

## 2017-12-10 NOTE — Progress Notes (Signed)
Regina Gong, RN  Rehab Admission Coordinator  Physical Medicine and Rehabilitation  PMR Pre-admission  Signed  Date of Service:  12/10/2017 11:50 AM       Related encounter: ED to Hosp-Admission (Current) from 12/01/2017 in [place]         Show:Clear all [x] Manual[x] Template[x] Copied  Added by: [x] Regina Gong, RN  [] Hover for details PMR Admission Coordinator Pre-Admission Assessment  Patient: Regina [name] is an 77 y.o., female MRN: [id number] DOB: 1940-05-07 Height: 5\' 2"  (157.5 cm) Weight: 72.8 kg                                                                                                                                                  Insurance Information HMO:     PPO:      PCP:      IPA:      80/20:      OTHER: no HMO PRIMARY: Medicare part a only      Policy#: [id number]      Subscriber: pt Benefits:  Phone #: passport one online     Name: 12/07/17 Eff. Date: 08/01/2005     Deduct: $1364      Out of Pocket Max: none      Life Max: none CIR: 100%      SNF: 20 full days Outpatient: 80%     Co-Pay: 20% Home Health: 100%      Co-Pay: none DME: 80%     Co-Pay: 20% Providers: pt choice  SECONDARY: Regina [name]      Policy#: [id number]      Subscriber: pt  Medicaid Application Date:       Case Manager:  Disability Application Date:       Case Worker:   Emergency Contact Information         Contact Information    Name Relation Home Work Mobile   Regina [name] Daughter [phone]     Regina [name] Spouse [phone]       Current Medical History  Patient Admitting Diagnosis: cervical stenosis/spondylosis with myelopathy  History of Present Illness: Regina Vanginderdeurenis a 77 year old right-handed female with history of Hashimoto's disease, diabetes mellitus.Presented 12/01/2017 with recent work-up for hyponatremia as well as syncope. She is followed outpatient by  neurosurgery for cervical stenosis and myelopathy. Recent MRI and imaging revealed broad-based disc protrusion C3-4, 4-5 and C5-6 with cervical stenosis myelopathy. Underwent discectomy C3-4, 4-5 and 5-6 for decompression of spinal cord existing nerve roots 12/06/2017. Hospital course pain management. Hard cervical collar in place. Sodium level stable 129-131and her HCTZ was held. TSH levels were normal. No evidence of adrenal insufficiency..Bouts of hypokalemia with supplement added.Echocardiogram with ejection fraction of 65% no wall motion abnormalities. Tolerating a regular diet.   Past Medical History      Past  Medical History:  Diagnosis Date  . Anxiety   . Arthritis    neck  . Asthma   . Cervical spondylolysis   . Cervical spondylolysis   . Depression   . Hashimoto's disease   . Insulin resistance   . PONV (postoperative nausea and vomiting)    hasn't had surgery since 1993    Family History  family history is not on file.  Prior Rehab/Hospitalizations:  Has the patient had major surgery during 100 days prior to admission? No  Current Medications   Current Facility-Administered Medications:  .  0.9 %  sodium chloride infusion, 250 mL, Intravenous, Continuous, Nundkumar, Nena Polio, MD, Last Rate: 1 mL/hr at 12/08/17 1113, 1,000 mL at 12/08/17 1113 .  acetaminophen (TYLENOL) tablet 1,000 mg, 1,000 mg, Oral, Q6H PRN, [name], MD, 1,000 mg at 12/09/17 0925 .  albuterol (PROVENTIL) (2.5 MG/3ML) 0.083% nebulizer solution 3 mL, 3 mL, Inhalation, Q6H PRN, [name], MD, 3 mL at 12/04/17 2237 .  ALPRAZolam Duanne Moron) tablet 0.25 mg, 0.25 mg, Oral, TID PRN, [name], MD, 0.25 mg at 12/10/17 7408 .  alum & mag hydroxide-simeth (MAALOX/MYLANTA) 200-200-20 MG/5ML suspension 30 mL, 30 mL, Oral, Q4H PRN, Purohit, Shrey C, MD, 30 mL at 12/09/17 2259 .  bisacodyl (DULCOLAX) suppository 10 mg, 10 mg, Rectal, Daily PRN, [name],  MD .  chlorhexidine (HIBICLENS) 4 % liquid, , Topical, Once, [name], MD .  docusate (COLACE) 50 MG/5ML liquid 100 mg, 100 mg, Oral, BID, Purohit, Shrey C, MD, 100 mg at 12/10/17 0841 .  escitalopram (LEXAPRO) tablet 10 mg, 10 mg, Oral, QHS, [name], MD, 10 mg at 12/09/17 2051 .  fluticasone (FLONASE) 50 MCG/ACT nasal spray 1 spray, 1 spray, Each Nare, Daily, [name], MD, 1 spray at 12/10/17 0842 .  hydrALAZINE (APRESOLINE) tablet 50 mg, 50 mg, Oral, Q8H, Purohit, Shrey C, MD, 50 mg at 12/10/17 0610 .  levothyroxine (SYNTHROID, LEVOTHROID) tablet 75 mcg, 75 mcg, Oral, QAC breakfast, [name], MD, 75 mcg at 12/10/17 0841 .  menthol-cetylpyridinium (CEPACOL) lozenge 3 mg, 1 lozenge, Oral, PRN, 3 mg at 12/08/17 0239 **OR** phenol (CHLORASEPTIC) mouth spray 1 spray, 1 spray, Mouth/Throat, PRN, [name], MD .  metaxalone [facility]) tablet 800 mg, 800 mg, Oral, TID PRN, [name], MD, 800 mg at 12/10/17 0643 .  montelukast (SINGULAIR) tablet 10 mg, 10 mg, Oral, QHS, [name], MD, 10 mg at 12/09/17 2051 .  ondansetron (ZOFRAN) tablet 4 mg, 4 mg, Oral, Q6H PRN **OR** ondansetron (ZOFRAN) injection 4 mg, 4 mg, Intravenous, Q6H PRN, [name], MD .  oxyCODONE (Oxy IR/ROXICODONE) immediate release tablet 10 mg, 10 mg, Oral, Q3H PRN, [name], MD, 10 mg at 12/08/17 1501 .  oxyCODONE (Oxy IR/ROXICODONE) immediate release tablet 5 mg, 5 mg, Oral, Q3H PRN, [name], MD, 5 mg at 12/09/17 2309 .  pantoprazole (PROTONIX) EC tablet 40 mg, 40 mg, Oral, BID, Kary Kos, MD, 40 mg at 12/10/17 0841 .  polyethylene glycol (MIRALAX / GLYCOLAX) packet 17 g, 17 g, Oral, Daily PRN, [name], MD .  senna (SENOKOT) tablet 8.6 mg, 1 tablet, Oral, BID, [name], MD, 8.6 mg at 12/09/17 2052 .  sodium chloride flush (NS) 0.9 % injection 3 mL, 3 mL, Intravenous, Q12H, [name], MD, 3 mL at 12/10/17 0841 .   sodium chloride flush (NS) 0.9 % injection 3 mL, 3 mL, Intravenous, PRN, [name], MD .  sodium chloride tablet 2 g, 2 g, Oral, TID WC,  Purohit, Konrad Dolores, MD, 2 g at 12/10/17 0841 .  sodium phosphate (FLEET) 7-19 GM/118ML enema 1 enema, 1 enema, Rectal, Once PRN, [name], MD  Patients Current Diet:     Diet Order                  Diet - low sodium heart healthy         Diet regular Room service appropriate? 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 None of the above  Prior Functional Level Prior Function Level of Independence: Independent  Self Care: Did the patient need help bathing, dressing, using the toilet or eating?  Independent  Indoor Mobility: Did the patient need assistance with walking from room to room (with or without device)? Independent  Stairs: Did the patient need assistance with internal or external stairs (with or without device)? Independent  Functional Cognition: Did the patient need help planning regular tasks such as shopping or remembering to take medications? Independent  Current Functional Level Cognition  Overall Cognitive Status: Within Functional Limits for tasks assessed Orientation Level: Oriented X4    Extremity Assessment (includes Sensation/Coordination)  Upper Extremity Assessment: RUE deficits/detail, LUE deficits/detail RUE Deficits / Details: 3/5  elbow and distal, 2+/5 at shoulder level. Pt reports being able to hold and drink from lidded cup with straw which she was not able to do just prior to surgery. Struggled with reaching soap and paper towel dispensers. Using whole hand movement to turn faucet on and off.  RUE Sensation: decreased light touch RUE Coordination: decreased fine motor, decreased gross motor LUE Deficits / Details: 3/5 elbow and distal, 2+/5 at shoulder level. Struggled with reaching soap and paper towel dispensers. Using whole hand movement to turn faucet on and off.  LUE Sensation: decreased light touch LUE Coordination: decreased fine motor, decreased gross motor  Lower Extremity Assessment: Defer to PT evaluation    ADLs  Overall ADL's : Needs assistance/impaired Eating/Feeding: Sitting, Set up, With adaptive utensils, Minimal assistance Eating/Feeding Details (indicate cue type and reason): pt reports difficulty grasping red tubing on fork. able to hold cup and drink from straw Grooming: Moderate assistance, Standing Grooming Details (indicate cue type and reason): min guard for balance. assist due to decreased [facility] and gross motor Upper Body Bathing: Moderate assistance, Sitting Lower Body Bathing: Sit to/from stand, Maximal assistance Upper Body Dressing : Moderate assistance, Sitting Upper Body Dressing Details (indicate cue type and reason): putting on gown as a robe Lower Body Dressing: Maximal assistance, Sit to/from stand Lower Body Dressing Details (indicate cue type and reason): Mod A sit<>stand Toilet Transfer: Min guard, Ambulation, RW Toilet Transfer Details (indicate cue type and reason): side step 3 steps up towards [place] with Bil HHA Toileting- Clothing Manipulation and Hygiene: Minimal assistance, Sit to/from stand Toileting - Clothing Manipulation Details (indicate cue type and reason): Mod A sit<>stand Functional mobility during ADLs: Min guard, Rolling walker General ADL Comments: Close min  guard  for functional mobility. Completed toilet transfer and grooming task. Assist to powerup at times during transfers and assist for gross and [facility] tasks.    Mobility  Overal bed mobility: Needs Assistance Bed Mobility: Sit to Sidelying Rolling: Min assist(VCs for sequencing) Sidelying to sit: Mod assist(HOB flat and use of rail, A for legs and up with trunk) Supine to sit: Mod assist Sit to supine: Min assist Sit to sidelying: Mod assist(A for legs) General bed mobility comments: pt in chair on arrival    Transfers  Overall transfer level: Needs assistance Equipment used: Rolling walker (2 wheeled) Transfers: Sit to/from Stand Sit to Stand: Min assist General transfer comment: min assist for stability when powering up to standing. VCs for hand placement and positioning prior to elevation to upright    Ambulation / Gait / Stairs / Wheelchair Mobility  Ambulation/Gait Ambulation/Gait assistance: Herbalist (Feet): 70 Feet Assistive device: 1 person hand held assist Gait Pattern/deviations: Step-through pattern, Decreased step length - right, Decreased weight shift to right, Decreased dorsiflexion - right, Decreased stride length, Wide base of support, Antalgic General Gait Details: Patient required HHA for stability with wrap around support. Max multi modal cues for posture and pacing. ocassional LOB noted Gait velocity: decreased Gait velocity interpretation: <1.31 ft/sec, indicative of household ambulator    Posture / Balance Balance Overall balance assessment: Needs assistance Sitting-balance support: No upper extremity supported, Feet supported Sitting balance-Leahy Scale: Fair Standing balance support: Bilateral upper extremity supported Standing balance-Leahy Scale: Poor Standing balance comment: Hands on assist for static standing via external support    Special needs/care consideration BiPAP/CPAP n/a CPM n/a Continuous Drip IV n/a Dialysis  n/a Life Vest n/a Oxygen n/a Special Bed n/a Trach Size n/a Wound Vac n/a Skin surgical incision Bowel mgmt: continent LBM 9/7 Bladder mgmt: continent Diabetic mgmt yes   Previous Home Environment Living Arrangements: Spouse/significant other, Children  Lives With: Spouse Available Help at Discharge: (spouse available 24/7; daughter 71 year old Pharmacist, hospital) Type of Home: House Home Layout: One level Home Access: Stairs to enter Entrance Stairs-Rails: None Entrance Stairs-Number of Steps: threshold Bathroom Shower/Tub: Multimedia programmer: Standard Bathroom Accessibility: Yes How Accessible: Accessible via walker Home Care Services: No Additional Comments: Wife is caregiver for husband who has had stroke. Mostly cognitive side and no physical assist.   Discharge Living Setting Plans for Discharge Living Setting: Patient's home, Lives with (comment)(spouse) Type of Home at Discharge: House Discharge Home Layout: One level Discharge Home Access: Stairs to enter Entrance Stairs-Rails: None Entrance Stairs-Number of Steps: threshold Discharge Bathroom Shower/Tub: Walk-in shower Discharge Bathroom Toilet: Standard Does the patient have any problems obtaining your medications?: No  Social/Family/Support Systems Patient Roles: Spouse, Parent, Risk analyst Information: Santiago Glad, daughter Anticipated Caregiver: spouse and daughter Anticipated Caregiver's Contact Information: see above Ability/Limitations of Caregiver: spouse has had multiple CVAs, cognition is his issue Caregiver Availability: 24/7 Discharge Plan Discussed with Primary Caregiver: Yes Is Caregiver In Agreement with Plan?: Yes Does Caregiver/Family have Issues with Lodging/Transportation while Pt is in Rehab?: No   Patient's spouse history of CVA. Patient states she is his caregiver. Neighbor transports him to hospital daily and then daughter picks him up after work and he then goes home. He is very  ambulatory and appropriate at bedside.  Goals/Additional Needs Patient/Family Goal for Rehab: Mod I with PT, OT, and SLP Expected length of stay: ELOS 7 to 10 days Additional Information: Pt's 25 year old sister, Gay Filler, is coming on 9/19 to  assist patient in her care at home Pt/Family Agrees to Admission and willing to participate: Yes Program Orientation Provided & Reviewed with Pt/Caregiver Including Roles  & Responsibilities: Yes  Decrease burden of Care through IP rehab admission: n/a  Possible need for SNF placement upon discharge: not anticipated  Patient Condition: This patient's medical and functional status has changed since the consult dated: 12/07/2017 in which the Rehabilitation Physician determined and documented that the patient's condition is appropriate for intensive rehabilitative care in an inpatient rehabilitation facility. See "History of Present Illness" (above) for medical update. Functional changes are: min to mod assist. Patient's medical and functional status update has been discussed with the Rehabilitation physician and patient remains appropriate for inpatient rehabilitation. Will admit to inpatient rehab today.  Preadmission Screen Completed By:  Cleatrice Burke, 12/10/2017 11:50 AM ______________________________________________________________________   Discussed status with Dr. Posey Pronto on 12/10/2017 at  1157 and received telephone approval for admission today.  Admission Coordinator:  Cleatrice Burke, time 3716 Date 12/10/2017           Cosigned by: Jamse Arn, MD at 12/10/2017 12:10 PM  Revision History

## 2017-12-10 NOTE — Progress Notes (Signed)
Occupational Therapy Treatment Patient Details Name: Regina [name] MRN: [id number] DOB: 11-Feb-1941 Today's Date: 12/10/2017    History of present illness Pt is a 77 y/o female admitted secondary to possible syncope. Pt found to have hyponatremia as well. 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 PMH includes hashimotos disease, asthma, and cervical spondylosis. Pt now s/p ANTERIOR CERVICAL DECOMPRESSION/DISCECTOMY FUSION 3 levels   OT comments  Pt progressing towards OT goals this session, worked on LB dressing, toilet transfer, sink level grooming, reinforcement of cervical precautions. Pt continues to require assist and CIR level therapy. Pt is very sweet and pleasant.  Follow Up Recommendations  CIR;Supervision/Assistance - 24 hour    Equipment Recommendations       Recommendations for Other Services      Precautions / Restrictions Precautions Precautions: Fall Restrictions Weight Bearing Restrictions: No       Mobility Bed Mobility Overal bed mobility: Needs Assistance Bed Mobility: Sit to Sidelying           General bed mobility comments: pt in chair on arrival  Transfers Overall transfer level: Needs assistance Equipment used: Rolling walker (2 wheeled) Transfers: Sit to/from Stand Sit to Stand: Min assist         General transfer comment: min assist for stability when powering up to standing. VCs for hand placement and positioning prior to elevation to upright    Balance Overall balance assessment: Needs assistance Sitting-balance support: No upper extremity supported;Feet supported Sitting balance-Leahy Scale: Fair     Standing balance support: Bilateral upper extremity supported Standing balance-Leahy Scale: Poor Standing balance comment: Hands on assist for static standing via external support                           ADL either performed or assessed with clinical judgement    ADL Overall ADL's : Needs assistance/impaired     Grooming: Wash/dry hands;Wash/dry face;Min guard;Standing Grooming Details (indicate cue type and reason): min guard for balance             Lower Body Dressing: Maximal assistance;Sit to/from stand Lower Body Dressing Details (indicate cue type and reason): currently unable to adjust socks Toilet Transfer: Min guard;Ambulation;RW Toilet Transfer Details (indicate cue type and reason): requires mod A for ambulation without RW Toileting- Clothing Manipulation and Hygiene: Min guard;Sit to/from stand Toileting - Clothing Manipulation Details (indicate cue type and reason): front peri care     Functional mobility during ADLs: Min guard;Rolling walker General ADL Comments: Close min guard for functional mobility. Completed toilet transfer and grooming task. Assist to powerup at times during transfers and assist for gross and [facility] tasks.     Vision       Perception     Praxis      Cognition Arousal/Alertness: Awake/alert Behavior During Therapy: WFL for tasks assessed/performed Overall Cognitive Status: Within Functional Limits for tasks assessed                                          Exercises     Shoulder Instructions       General Comments husband present throughout session    Pertinent Vitals/ Pain       Pain Assessment: Faces Faces Pain Scale: Hurts little more Pain Location: neck Pain Descriptors / Indicators: Aching;Grimacing;Guarding Pain Intervention(s):  Monitored during session;Repositioned  Home Living     Available Help at Discharge: (spouse available 24/7; daughter 26 year old Pharmacist, hospital)                     Bathroom Accessibility: Yes How Accessible: Accessible via walker        Lives With: Spouse    Prior Functioning/Environment              Frequency  Min 3X/week        Progress Toward Goals  OT Goals(current goals can now be found in the care plan  section)  Progress towards OT goals: Progressing toward goals  Acute Rehab OT Goals Patient Stated Goal: To be able to take care of myself again OT Goal Formulation: With patient Time For Goal Achievement: 12/17/17 Potential to Achieve Goals: Good  Plan Discharge plan remains appropriate    Co-evaluation    PT/OT/SLP Co-Evaluation/Treatment: Yes Reason for Co-Treatment: For patient/therapist safety PT goals addressed during session: Mobility/safety with mobility;Balance;Proper use of DME OT goals addressed during session: ADL's and self-care;Proper use of Adaptive equipment and DME;Strengthening/ROM      AM-PAC PT "6 Clicks" Daily Activity     Outcome Measure   Help from another person eating meals?: A Little Help from another person taking care of personal grooming?: A Little Help from another person toileting, which includes using toliet, bedpan, or urinal?: A Lot Help from another person bathing (including washing, rinsing, drying)?: A Little Help from another person to put on and taking off regular upper body clothing?: A Lot Help from another person to put on and taking off regular lower body clothing?: A Lot 6 Click Score: 15    End of Session Equipment Utilized During Treatment: Rolling walker;Gait belt  OT Visit Diagnosis: Other abnormalities of gait and mobility (R26.89);Unsteadiness on feet (R26.81);History of falling (Z91.81);Muscle weakness (generalized) (M62.81)   Activity Tolerance Patient tolerated treatment well   Patient Left in chair;with call bell/phone within reach   Nurse Communication Mobility status        Time: 4076-8088 OT Time Calculation (min): 25 min  Charges: OT General Charges $OT Visit: 1 Visit OT Treatments $Self Care/Home Management : 8-22 mins  Hulda Humphrey OTR/L Acute Rehabilitation Services Pager: [phone] Office: [place] 12/10/2017, 1:52 PM

## 2017-12-11 ENCOUNTER — Other Ambulatory Visit: Payer: Self-pay

## 2017-12-11 ENCOUNTER — Inpatient Hospital Stay (HOSPITAL_COMMUNITY): Payer: Medicare Other

## 2017-12-11 ENCOUNTER — Inpatient Hospital Stay (HOSPITAL_COMMUNITY): Payer: Federal, State, Local not specified - PPO | Admitting: Physical Therapy

## 2017-12-11 ENCOUNTER — Encounter (HOSPITAL_COMMUNITY): Payer: Self-pay

## 2017-12-11 ENCOUNTER — Inpatient Hospital Stay (HOSPITAL_COMMUNITY): Payer: Federal, State, Local not specified - PPO | Admitting: Occupational Therapy

## 2017-12-11 ENCOUNTER — Inpatient Hospital Stay (HOSPITAL_COMMUNITY): Payer: Federal, State, Local not specified - PPO

## 2017-12-11 DIAGNOSIS — I169 Hypertensive crisis, unspecified: Secondary | ICD-10-CM

## 2017-12-11 DIAGNOSIS — E871 Hypo-osmolality and hyponatremia: Secondary | ICD-10-CM

## 2017-12-11 DIAGNOSIS — M7989 Other specified soft tissue disorders: Secondary | ICD-10-CM

## 2017-12-11 DIAGNOSIS — E46 Unspecified protein-calorie malnutrition: Secondary | ICD-10-CM

## 2017-12-11 DIAGNOSIS — D62 Acute posthemorrhagic anemia: Secondary | ICD-10-CM

## 2017-12-11 DIAGNOSIS — G959 Disease of spinal cord, unspecified: Secondary | ICD-10-CM

## 2017-12-11 DIAGNOSIS — E119 Type 2 diabetes mellitus without complications: Secondary | ICD-10-CM

## 2017-12-11 DIAGNOSIS — E8809 Other disorders of plasma-protein metabolism, not elsewhere classified: Secondary | ICD-10-CM

## 2017-12-11 DIAGNOSIS — I10 Essential (primary) hypertension: Secondary | ICD-10-CM

## 2017-12-11 LAB — GLUCOSE, CAPILLARY
Glucose-Capillary: 98 mg/dL (ref 70–99)
Glucose-Capillary: 100 mg/dL — ABNORMAL HIGH (ref 70–99)
Glucose-Capillary: 99 mg/dL (ref 70–99)
Glucose-Capillary: 99 mg/dL (ref 70–99)

## 2017-12-11 MED ORDER — PRO-STAT SUGAR FREE PO LIQD
30.0000 mL | Freq: Two times a day (BID) | ORAL | Status: DC
  Administered 2017-12-11 – 2017-12-20 (×19): 30 mL via ORAL
  Filled 2017-12-11 (×19): qty 30

## 2017-12-11 NOTE — Care Management Note (Signed)
Inpatient [facility] Individual Statement of Services  Patient Name:  Regina [name]  Date:  12/11/2017  Welcome to the [facility].  Our goal is to provide you with an individualized program based on your diagnosis and situation, designed to meet your specific needs.  With this comprehensive rehabilitation program, you will be expected to participate in at least 3 hours of rehabilitation therapies Monday-Friday, with modified therapy programming on the weekends.  Your rehabilitation program will include the following services:  Physical Therapy (PT), Occupational Therapy (OT), Speech Therapy (ST), 24 hour per day rehabilitation nursing, Case Management (Social Worker), Rehabilitation Medicine, Nutrition Services and Pharmacy Services  Weekly team conferences will be held on Wednesday to discuss your progress.  Your Social Worker will talk with you frequently to get your input and to update you on team discussions.  Team conferences with you and your family in attendance may also be held.  Expected length of stay: 8-12 days  Overall anticipated outcome: supervision-mod/i level  Depending on your progress and recovery, your program may change. Your Social Worker will coordinate services and will keep you informed of any changes. Your Social Worker's name and contact numbers are listed  below.  The following services may also be recommended but are not provided by the [facility] will be made to provide these services after discharge if needed.  Arrangements include referral to agencies that provide these services.  Your insurance has been verified to be:  Medicare part [place] Your primary doctor is:  Maurice Small  Pertinent information will be shared with your doctor and your insurance company.  Social Worker:   Ovidio Kin, [place] or ([phone]  Information discussed with and copy given to patient by: [name], 12/11/2017, 9:18 AM

## 2017-12-11 NOTE — Progress Notes (Signed)
[place] PHYSICAL MEDICINE & REHABILITATION     PROGRESS NOTE  Subjective/Complaints:  Patient seen sitting up in her chair this morning. She states she slept well overnight. She states she is ready to begin therapies today.  ROS: denies CP, SOB, nausea, vomiting, diarrhea.  Objective: Vital Signs: Blood pressure (!) 181/69, pulse 73, temperature 98.2 F (36.8 C), temperature source Oral, resp. rate 12, SpO2 99 %. No results found. Recent Labs    12/10/17 2312  WBC 10.0  HGB 11.0*  HCT 33.5*  PLT 396   Recent Labs    12/10/17 0206 12/10/17 2312  NA 131* 133*  K 3.4* 4.2  CL 98 99  GLUCOSE 97 109*  BUN 10 12  CREATININE 0.71 0.81  CALCIUM 8.7* 9.0   CBG (last 3)  Recent Labs    12/10/17 1737 12/10/17 2215 12/11/17 0812  GLUCAP 137* 114* 98    Wt Readings from Last 3 Encounters:  12/01/17 72.8 kg  11/29/17 71.9 kg  02/17/13 81.2 kg    Physical Exam:  BP (!) 181/69 (BP Location: Right Arm)   Pulse 73   Temp 98.2 F (36.8 C) (Oral)   Resp 12   SpO2 99%  Constitutional: She appears well-developed and well-nourished.  HENT: Normocephalic and atraumatic.  Eyes: EOM are normal. No discharge. Neck: Cervical collar in place  Cardiovascular: Normal rate, regular rhythm and no  JVD.  Respiratory: Effort normal and breath sounds normal.  GI: Bowel sounds are normal. She exhibits no distension.  Musculoskeletal: No edema or tenderness in extremities  Neurological: She is alert and oriented.  Follows full commands Motor: Bilateral upper extremities: Shoulder flexion, elbow flexion/extension, wrist extension 4/5, hand grip 4 -/5, stable Bilateral lower extremities: 4+/5 proximal distal Skin: Skin is warm and dry.  Psychiatric: She has a normal mood and affect. Her behavior is normal.   Assessment/Plan: 1. 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 Physiatrist is providing close team supervision and 24 hour management of active medical problems listed below. Physiatrist and rehab team continue to assess barriers to discharge/monitor patient progress toward functional and medical goals.  Function:  Bathing Bathing position      Bathing parts      Bathing assist        Upper Body Dressing/Undressing Upper body dressing                    Upper body assist        Lower Body Dressing/Undressing Lower body dressing                                  Lower body assist        Toileting Toileting          Toileting assist     Transfers Chair/bed transfer             Locomotion Ambulation           Wheelchair          Cognition Comprehension    Expression    Social Interaction    Problem Solving    Memory      Medical Problem List and Plan: 1.  Decreased functional mobility secondary to cervical stenosis/spondylosis/myelopathy.  Status post discectomy C3-4, 4-5 and 5-6 for decompression of spinal  cord and existing nerve roots 12/06/2017.  Cervical collar  Begin CIR 2.  DVT Prophylaxis/Anticoagulation: SCDs 3. Pain Management: Skelaxin and oxycodone as needed 4. Mood: Lexapro 10 mg nightly, Xanax 0.25 mg 3 times daily as needed 5. Neuropsych: This patient is capable of making decisions on her own behalf. 6. Skin/Wound Care: Routine skin checks 7. Fluids/Electrolytes/Nutrition: Routine in and outs  8. Hyponatremia: HCTZ held.    Sodium 133 on 9/9  Continue sodium chloride tablets   Continue to monitor 9.  Bouts of syncope.  Monitor blood pressure when out of bed.  Echocardiogram with ejection fraction of 65%. 10.  Hypertension.  Hydralazine 50 mg every 8 hours  Monitor with increased mobility  Labile with hypertensive crisis a.m. 11.  History of diabetes mellitus.   Patient on Glucophage 850 mg twice daily prior  to admission. Check CBG ac/HS. Resume as needed  Monitor with increased mobility 12.  Hypothyroidism.  Synthroid 13.  Asthma/COPD.  Continue nebulizers and Singulair 14.  Constipation.  Laxative assistance 15. Hypoalbuminemia  Supplement initiated on 9/10 16. Acute blood loss anemia  Hemoglobin 11.0 on 9/10  Continue to monitor  LOS (Days) 1 A FACE TO FACE EVALUATION WAS PERFORMED  Ankit Lorie Phenix 12/11/2017 8:57 AM

## 2017-12-11 NOTE — Progress Notes (Signed)
Per nursing, patient was given "Data Collection Information Summary for Patients in Inpatient Rehabilitation Facilities with attached [facility] Records" upon admission.     Patient information reviewed and entered into eRehab System by Ileana Ladd, covering PPS coordinator. Information including medical coding and functional independence measure will be reviewed and updated through discharge.

## 2017-12-11 NOTE — Evaluation (Signed)
Physical Therapy Assessment and Plan  Patient Details  Name: Regina [name] MRN: [id number] Date of Birth: 1940/09/28  PT Diagnosis: Abnormality of gait, Difficulty walking and Muscle weakness Rehab Potential: Good ELOS: 8-12 days   Today's Date: 12/11/2017 PT Individual Time: 0800-0900 PT Individual Time Calculation (min): 60 min    Problem List:  Patient Active Problem List   Diagnosis Date Noted  . Hypertensive crisis   . 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 Hypoalbuminemia due to protein-calorie malnutrition ([place])   . Acute blood loss anemia   . Myelopathy ([place]) 12/10/2017  . HNP (herniated nucleus pulposus) with myelopathy, cervical   . Essential hypertension   . Uncomplicated asthma   . Insulin resistance   . Hashimoto's disease   . Cervical spondylolysis   . Anxiety   . Hyponatremia 12/01/2017  . Hypothyroidism 12/01/2017    Past Medical History:  Past Medical History:  Diagnosis Date  . Anxiety   . Arthritis    neck  . Asthma   . Cervical spondylolysis   . Cervical spondylolysis   . Depression   . Hashimoto's disease   . Insulin resistance   . PONV (postoperative nausea and vomiting)    hasn't had surgery since 1993   Past Surgical History:  Past Surgical History:  Procedure Laterality Date  . ANTERIOR CERVICAL DECOMP/DISCECTOMY FUSION N/A 12/06/2017   Procedure: ANTERIOR CERVICAL DECOMPRESSION/DISCECTOMY FUSION, CERVICAL 3- CERVICAL 4, CERVICAL 4- CERVICAL 5, CERVICAL 5- CERVICAL 6;  Surgeon: [name], MD;  Location: [facility];  Service: Neurosurgery;  Laterality: N/A;  ANTERIOR CERVICAL DECOMPRESSION/DISCECTOMY FUSION, CERVICAL THREE- CERVICAL FOUR, CERVICAL FOUR- CERVICAL FIVE, CERVICAL FIVE- CERVICAL SIX  . APPENDECTOMY    . BREAST SURGERY     left biopsy  . CESAREAN SECTION     x2  . CHOLECYSTECTOMY    . DENTAL SURGERY     teeth removal  . OVARY SURGERY     ovarian wedge  . TONSILLECTOMY      Assessment &  Plan Clinical Impression: Regina [name] is a 77 year old right-handed female with history of Hashimoto's disease, diabetes mellitus. Per chart review, patient, and husband, patient lives with spouse reported to be independent prior to admission.  One level home.  Husband with history of CVA and limited physically.  She has a daughter in the area that works.  Presented 12/01/2017 with recent work-up for hyponatremia as well as syncope.  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  Recent MRI and imaging revealed broad-based disc protrusion C3-4, 4-5 and C5-6 with cervical stenosis myelopathy.  Underwent discectomy C3-4, 4-5 and 5-6 for decompression of spinal cord existing nerve roots 12/06/2017.  Hospital course pain management.  Hard cervical collar in place.  Sodium level stable 129-131 and her HCTZ was held.  TSH levels were normal.  No evidence of adrenal insufficiency. Bouts of hypokalemia with supplement added.  Echocardiogram with ejection fraction of 65% no wall motion abnormalities.  Tolerating a regular diet.  Therapy evaluations completed.  Patient was admitted for a comprehensive rehab program. Patient transferred to CIR on 12/10/2017 .   Patient currently requires min with mobility secondary to muscle weakness, decreased cardiorespiratoy endurance, decreased coordination and decreased standing balance, decreased postural control and decreased balance strategies.  Prior to hospitalization, patient was independent  with mobility and lived with Spouse in a House home.  Home access is small thresholdStairs to enter.  Patient will benefit from skilled PT intervention  to maximize safe functional mobility, minimize fall risk and decrease caregiver burden for planned discharge home with 24 hour supervision.  Anticipate patient will benefit from follow up [place] at discharge.  PT - End of Session Activity Tolerance: Tolerates 30+ min activity with multiple  rests Endurance Deficit: Yes Endurance Deficit Description: requires occasional seated rest breaks d/t fatigue with mobility activities PT Assessment Rehab Potential (ACUTE/IP ONLY): Good PT Barriers to Discharge: Decreased caregiver support;Lack of/limited family support PT Barriers to Discharge Comments: husband with baseline cognitive deficits due to multiple CVAs, daughter only available intermittently PT Patient demonstrates impairments in the following area(s): Balance;Endurance;Motor;Safety;Pain PT Transfers Functional Problem(s): Bed Mobility;Bed to Chair;Car;Furniture PT Locomotion Functional Problem(s): Ambulation;Stairs PT Plan PT Intensity: Minimum of 1-2 x/day ,45 to 90 minutes PT Frequency: 5 out of 7 days PT Duration Estimated Length of Stay: 8-12 days PT Treatment/Interventions: Ambulation/gait training;Discharge planning;Functional mobility training;Psychosocial support;Therapeutic Activities;Visual/perceptual remediation/compensation;Therapeutic Exercise;Skin care/wound management;Neuromuscular re-education;Disease management/prevention;Balance/vestibular training;DME/adaptive equipment instruction;Pain management;Splinting/orthotics;UE/LE Strength taining/ROM;UE/LE Coordination activities;Stair training;Patient/family education;Community reintegration PT Transfers Anticipated Outcome(s): modI  PT Locomotion Anticipated Outcome(s): S with LRAD PT Recommendation Recommendations for Other Services: Therapeutic Recreation consult Therapeutic Recreation Interventions: Outing/community reintergration Follow Up Recommendations: Home health PT;24 hour supervision/assistance Patient destination: Home Equipment Recommended: To be determined Equipment Details: has no equipment per pt report  Skilled Therapeutic Intervention Pt received in recliner, denies pain and agreeable to treatment. PT initial evaluation performed and completed with minA overall as described below using RW with  assist d/t BLE incoordination, decreased activity tolerance and mild strength deficits. Educated pt regarding rehab goals, estimated length of stay to be determined once all evaluations completed, falls prevention safety; pt agreeable to all the above. Remained in recliner at end of session, alarm intact and all needs in reach.   PT Evaluation Precautions/Restrictions Precautions Precautions: Cervical;Fall Precaution Comments: Per MD orders: pt can take off brace for showering and walking to the bathroom  Required Braces or Orthoses: Cervical Brace Cervical Brace: Hard collar Restrictions Weight Bearing Restrictions: No General Chart Reviewed: Yes Response to Previous Treatment: Not applicable Family/Caregiver Present: No  Pain Pain Assessment Pain Scale: 0-10 Pain Score: 0-No pain Faces Pain Scale: Hurts a little bit Pain Type: Acute pain Pain Location: Shoulder Pain Intervention(s): Medication (See eMAR);Repositioned Home Living/Prior Functioning Home Living Living Arrangements: Spouse/significant other Available Help at Discharge: Family;Available PRN/intermittently Type of Home: House Home Access: Stairs to enter Entrance Stairs-Number of Steps: small threshold Entrance Stairs-Rails: None Home Layout: One level Bathroom Shower/Tub: Multimedia programmer: Standard Bathroom Accessibility: Yes Additional Comments: Wife is caregiver for husband who has had stroke. Husband requires cognitive assist and no physical assist.   Lives With: Spouse Prior Function Level of Independence: Independent with basic ADLs;Independent with transfers;Independent with homemaking with ambulation;Independent with gait  Able to Take Stairs?: Yes Driving: Yes Vocation: Part time employment Vocation Requirements: ticket taker at [employer] Leisure: Hobbies-yes (Comment) Comments: walking dog Vision/Perception  Perception Perception: Within Functional Limits Praxis Praxis: Intact   Cognition Overall Cognitive Status: Within Functional Limits for tasks assessed Orientation Level: Oriented X4 Attention: Selective Selective Attention: Appears intact Memory: Appears intact Awareness: Appears intact Problem Solving: Appears intact Safety/Judgment: Appears intact Sensation Sensation Light Touch: Appears Intact Hot/Cold: Not tested Proprioception: Not tested Stereognosis: Not tested Additional Comments: Pt with increased time required and use of both hands for stereognosis; noted difficulty distinguishing with R hand only Coordination Gross Motor Movements are Fluid and Coordinated: No Fine Motor Movements are Fluid and Coordinated: Yes Coordination and  Movement Description: Noted difficulty placing items on counter during grooming tasks Finger Nose Finger Test: Increased time due to limited ROM in shoulder Heel Shin Test: impaired BLE Motor  Motor Motor: Other (comment) Motor - Skilled Clinical Observations: generalized weakness, decreased coordination BLEs  Mobility Transfers Transfers: Sit to Stand;Stand to Sit;Stand Pivot Transfers Sit to Stand: Contact Guard/Touching assist Stand to Sit: Contact Guard/Touching assist Stand Pivot Transfers: Contact Guard/Touching assist Transfer (Assistive device): Rolling walker Locomotion  Gait Ambulation: Yes Gait Assistance: Contact Guard/Touching assist;Minimal Assistance - Patient > 75% Gait Distance (Feet): 75 Feet Assistive device: Rolling walker Gait Assistance Details: Verbal cues for precautions/safety Gait Gait: Yes Gait Pattern: Impaired Gait Pattern: Poor foot clearance - left;Poor foot clearance - right;Decreased stride length;Right foot flat;Left foot flat Stairs / Additional Locomotion Stairs: Yes Stairs Assistance: Contact Guard/Touching assist Stair Management Technique: Forwards;Alternating pattern;Two rails Number of Stairs: 4 Height of Stairs: 6 Ramp: Contact Guard/touching  assist Wheelchair Mobility Wheelchair Mobility: No  Trunk/Postural Assessment  Cervical Assessment Cervical Assessment: Exceptions to WFL(limited in cervical collar) Thoracic Assessment Thoracic Assessment: Exceptions to WFL(rounded shoulders) Lumbar Assessment Lumbar Assessment: Exceptions to WFL(reduced lumbar lordosis, posterior pelvic tilt) Postural Control Postural Control: Deficits on evaluation(inefficient stepping/ankle strategies in standing)  Balance Balance Balance Assessed: Yes Standardized Balance Assessment Standardized Balance Assessment: Timed Up and Go Test Timed Up and Go Test TUG: Normal TUG Normal TUG (seconds): 55(RW, min guard) Static Sitting Balance Static Sitting - Balance Support: No upper extremity supported Static Sitting - Level of Assistance: 6: Modified independent (Device/Increase time) Dynamic Sitting Balance Dynamic Sitting - Balance Support: No upper extremity supported;Feet supported Dynamic Sitting - Level of Assistance: 5: Stand by assistance Static Standing Balance Static Standing - Balance Support: During functional activity;Bilateral upper extremity supported Static Standing - Level of Assistance: 5: Stand by assistance Dynamic Standing Balance Dynamic Standing - Balance Support: Bilateral upper extremity supported;During functional activity Dynamic Standing - Level of Assistance: 4: Min assist Extremity Assessment  RUE Assessment RUE Assessment: Exceptions to [facility] Passive Range of Motion (PROM) Comments: shoulder flexion ~100 degrees  Active Range of Motion (AROM) Comments: shoulder flexion ~90 degrees  General Strength Comments: Shouler: not fully tested due to cervical restricitons; elbow flexion/extension: 4/5 LUE Assessment LUE Assessment: Exceptions to [facility] Passive Range of Motion (PROM) Comments: shoulder flexion ~100 degrees  Active Range of Motion (AROM) Comments: shoulder flexion ~90 degrees  General Strength Comments: Shouler:  not fully tested due to cervical restrictions; elbow flexion/extension: 4/5 RLE Assessment RLE Assessment: Exceptions to [facility] RLE Strength RLE Overall Strength: Deficits Right Hip Flexion: 4-/5 Right Hip ABduction: 4/5 Right Hip ADduction: 4+/5 Right Knee Flexion: 4+/5 Right Knee Extension: 4+/5 Right Ankle Dorsiflexion: 5/5 Right Ankle Plantar Flexion: 5/5 LLE Assessment LLE Assessment: Exceptions to WFL LLE Strength LLE Overall Strength: Deficits Left Hip Flexion: 4+/5 Left Hip ABduction: 4+/5 Left Knee Flexion: 4+/5 Left Knee Extension: 4+/5 Left Ankle Dorsiflexion: 5/5 Left Ankle Plantar Flexion: 5/5   See Function Navigator for Current Functional Status.   Refer to Care Plan for Long Term Goals  Recommendations for other services: Therapeutic Recreation  Outing/community reintegration  Discharge Criteria: Patient will be discharged from PT if patient refuses treatment 3 consecutive times without medical reason, if treatment goals not met, if there is a change in medical status, if patient makes no progress towards goals or if patient is discharged from hospital.  The above assessment, treatment plan, treatment alternatives and goals were discussed and mutually agreed upon: by patient  Benjiman Core Seigle 12/11/2017, 4:10 PM

## 2017-12-11 NOTE — Progress Notes (Signed)
Occupational Therapy Assessment and Plan  Patient Details  Name: Regina [name] MRN: [id number] Date of Birth: [birth year]  OT Diagnosis: abnormal posture, acute pain, muscle weakness (generalized), pain in joint and swelling of limb Rehab Potential: Rehab Potential (ACUTE ONLY): Good ELOS: 10-12 days   Today's Date: 12/11/2017 OT Individual Time: 1000-1130 OT Individual Time Calculation (min): 90 min     Problem List:  Patient Active Problem List   Diagnosis Date Noted  . Hypertensive crisis   . 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 Hypoalbuminemia due to protein-calorie malnutrition ([place])   . Acute blood loss anemia   . Myelopathy ([place]) 12/10/2017  . HNP (herniated nucleus pulposus) with myelopathy, cervical   . Essential hypertension   . Uncomplicated asthma   . Insulin resistance   . Hashimoto's disease   . Cervical spondylolysis   . Anxiety   . Hyponatremia 12/01/2017  . Hypothyroidism 12/01/2017    Past Medical History:  Past Medical History:  Diagnosis Date  . Anxiety   . Arthritis    neck  . Asthma   . Cervical spondylolysis   . Cervical spondylolysis   . Depression   . Hashimoto's disease   . Insulin resistance   . PONV (postoperative nausea and vomiting)    hasn't had surgery since 1993   Past Surgical History:  Past Surgical History:  Procedure Laterality Date  . ANTERIOR CERVICAL DECOMP/DISCECTOMY FUSION N/A 12/06/2017   Procedure: ANTERIOR CERVICAL DECOMPRESSION/DISCECTOMY FUSION, CERVICAL 3- CERVICAL 4, CERVICAL 4- CERVICAL 5, CERVICAL 5- CERVICAL 6;  Surgeon: [name], MD;  Location: [facility];  Service: Neurosurgery;  Laterality: N/A;  ANTERIOR CERVICAL DECOMPRESSION/DISCECTOMY FUSION, CERVICAL THREE- CERVICAL FOUR, CERVICAL FOUR- CERVICAL FIVE, CERVICAL FIVE- CERVICAL SIX  . APPENDECTOMY    . BREAST SURGERY     left biopsy  . CESAREAN SECTION     x2  . CHOLECYSTECTOMY    . DENTAL SURGERY     teeth removal  . OVARY  SURGERY     ovarian wedge  . TONSILLECTOMY      Assessment & Plan Clinical Impression: Patient is a 77 y.o. year old female with recent admission to the hospital on 12/01/17 after falling at home in the middle of the night due to syncope episode with hyponaturia. Previously scheduled for spinal surgery prior to ED visit. Underwent discectomy C3-4, 4-5 and 5-6 for decompression of spinal cord existing nerve roots 12/06/2017. Patient transferred to CIR on 12/10/2017 .    Patient currently requires min with basic self-care skills secondary to muscle weakness, decreased cardiorespiratoy endurance, decreased coordination and decreased standing balance and decreased balance strategies.  Prior to hospitalization, patient could complete ADL/IADLs with independent .  Patient will benefit from skilled intervention to decrease level of assist with basic self-care skills, increase independence with basic self-care skills and increase level of independence with iADL prior to discharge home independently.  Anticipate patient will require intermittent supervision and follow up home health.  OT - End of Session Activity Tolerance: Tolerates < 10 min activity with changes in vital signs Endurance Deficit: Yes Endurance Deficit Description: Pt requiring seated rest breaks ~5 minutes of standing activity at sink  OT Assessment Rehab Potential (ACUTE ONLY): Good OT Patient demonstrates impairments in the following area(s): Balance;Endurance;Motor;Sensory;Pain OT Basic ADL's Functional Problem(s): Grooming;Bathing;Dressing;Toileting OT Advanced ADL's Functional Problem(s): Simple Meal Preparation OT Transfers Functional Problem(s): Toilet;Tub/Shower OT Additional Impairment(s): None OT Plan OT Intensity: Minimum of 1-2 x/day, 45 to 90 minutes OT  Frequency: 5 out of 7 days OT Duration/Estimated Length of Stay: 10-12 days OT Treatment/Interventions: Balance/vestibular training;Discharge planning;Self Care/advanced  ADL retraining;UE/LE Coordination activities;Therapeutic Activities;Functional mobility training;Patient/family education;Therapeutic Exercise;DME/adaptive equipment instruction;Neuromuscular re-education;UE/LE Strength taining/ROM;Disease mangement/prevention OT Self Feeding Anticipated Outcome(s): mod I  OT Basic Self-Care Anticipated Outcome(s): mod I  OT Toileting Anticipated Outcome(s): mod I OT Bathroom Transfers Anticipated Outcome(s): supervision-shower transfer; mod I toilet transfer  OT Recommendation Patient destination: Home Follow Up Recommendations: Home health OT Equipment Details: TBD   Skilled Therapeutic Intervention Pt sitting in w/c upon entry eager to begin therapy session with reports of neck pain, RN notifed and pt given muscle relaxer during session. Pt performed all functional transfers this session with min A with RW. Pt performed toileting tasks including hygiene seated on BSC over toilet. Pt transferred to tub transfer bench and performed bathing tasks seated with lateral leans performed for periarea. Pt then performed dressing tasks seated in w/c with ability to perform figure 4 for LB dressing. Standing at sink, pt performed grooming tasks and hygiene with replacement cervical pads with noted coordination deficits when reaching for items. Husband entered room halfway through session with pt demonstrating good attention and ability to process multiple stimuli during functional tasks.   Seated in w/c with adjustable table, pt performed self feeding with apple sauce with good gross motor control, manipulation of spoon and coordination. Discussed and educated pt and her husband regarding inpatient rehab services, goals, PLOF, and daily schedule with verbal comprehension. Pt transferred to recliner with BLE's elevated, chair alarm set and husband present.   OT Evaluation Precautions/Restrictions  Precautions Precautions: Cervical;Fall Precaution Comments: Per MD orders:  pt can take off brace for showering and walking to the bathroom  Required Braces or Orthoses: Cervical Brace Cervical Brace: Hard collar Restrictions Weight Bearing Restrictions: No  Home Living/Prior Functioning Home Living Living Arrangements: Spouse/significant other Available Help at Discharge: Family, Available PRN/intermittently(spouse available 24/7 however pt reports he has cognitive impairments. Daughter is a Pharmacist, hospital but is avilable intermittently. Pt's sister is coming to stay with pt mid September if needed for assistance upon d/c.) Type of Home: House Home Access: Stairs to enter [employer] of Steps: small threshold Entrance Stairs-Rails: None Home Layout: One level Bathroom Shower/Tub: Multimedia programmer: Standard Bathroom Accessibility: Yes Additional Comments: Wife is caregiver for husband who has had stroke. Husband requires cognitive assist and no physical assist.   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 [employer]: Walking their dog Prior Function Level of Independence: Independent with basic ADLs, Independent with transfers, Independent with homemaking with ambulation, Independent with gait  Able to Take Stairs?: Yes Driving: Yes Vocation: Part time employment Leisure: Hobbies-yes (Comment) Comments: walking dog Vision Baseline Vision/History: No visual deficits Patient Visual Report: No change from baseline Vision Assessment?: No apparent visual deficits Perception  Perception: Within Functional Limits Praxis Praxis:  Intact Cognition Overall Cognitive Status: Within Functional Limits for tasks assessed Orientation Level: Person;Place;Situation Person: Oriented Place: Oriented Situation: Oriented Year: 2019 Month: September Day of Week: Correct Memory: Appears intact Immediate Memory Recall: Sock;Blue;Bed Memory Recall: Sock;Blue;Bed Memory Recall Sock: Without Cue Memory Recall Blue: Without Cue Memory Recall Bed: Without Cue Attention: Selective Selective Attention: Appears intact Awareness: Appears intact  Problem Solving: Appears intact Safety/Judgment: Appears intact Sensation Sensation Light Touch: Appears Intact (5/5 correct; however pt reports "normal touch" in L UE compared to R UE ) Hot/Cold: Appears Intact Proprioception: Appears Intact Stereognosis: Appears Intact Additional Comments: Pt with increased time required and use of both hands for stereognosis; noted difficulty distinguishing with R hand only Coordination Gross Motor Movements are Fluid and Coordinated: Yes Fine Motor Movements are Fluid and Coordinated: Yes Coordination and Movement Description: Noted difficulty placing items on counter during grooming tasks Finger Nose Finger Test: Increased time due to limited ROM in shoulder Motor  Motor Motor: Other (comment) Motor - Skilled Clinical Observations: Generalized weakness  Trunk/Postural Assessment  Cervical Assessment Cervical Assessment: Exceptions to WFL(Pt with anterior cervical incision with hard cervical collar in place) Thoracic Assessment Thoracic Assessment: Exceptions to WFL(kyphotic- rounded shoulders) Lumbar Assessment Lumbar Assessment: Exceptions to WFL(posterior pelvic tilt ) Postural Control Postural Control: Deficits on evaluation(Posterior leaning )  Balance Balance Balance Assessed: Yes Extremity/Trunk Assessment RUE Assessment RUE Assessment: Exceptions to [facility] Passive Range of Motion (PROM) Comments: shoulder flexion ~100 degrees,  limited by pain  Active Range of Motion (AROM) Comments: shoulder flexion ~90 degrees  General Strength Comments: Shouler: not fully tested due to cervical restricitons; elbow flexion/extension: 4/5 LUE Assessment LUE Assessment: Exceptions to [facility] Passive Range of Motion (PROM) Comments: shoulder flexion ~100 degrees, limited by pain  Active Range of Motion (AROM) Comments: shoulder flexion ~90 degrees  General Strength Comments: Shouler: not fully tested due to cervical restricitons; elbow flexion/extension: 4/5   See Function Navigator for Current Functional Status.   Refer to Care Plan for Long Term Goals  Recommendations for other services: Therapeutic Recreation  Kitchen group and Outing/community reintegration   Discharge Criteria: Patient will be discharged from OT if patient refuses treatment 3 consecutive times without medical reason, if treatment goals not met, if there is a change in medical status, if patient makes no progress towards goals or if patient is discharged from hospital.  The above assessment, treatment plan, treatment alternatives and goals were discussed and mutually agreed upon: by patient  Desirie Minteer 12/11/2017, 3:09 PM

## 2017-12-11 NOTE — Progress Notes (Signed)
Social Work  Social Work Assessment and Plan  Patient Details  Name: Regina [name] MRN: [id number] Date of Birth: Nov 13, 1940  Today's Date: 12/11/2017  Problem List:  Patient Active Problem List   Diagnosis Date Noted  . Hypertensive crisis   . 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 Hypoalbuminemia due to protein-calorie malnutrition ([place])   . Acute blood loss anemia   . Myelopathy ([place]) 12/10/2017  . HNP (herniated nucleus pulposus) with myelopathy, cervical   . Essential hypertension   . Uncomplicated asthma   . Insulin resistance   . Hashimoto's disease   . Cervical spondylolysis   . Anxiety   . Hyponatremia 12/01/2017  . Hypothyroidism 12/01/2017   Past Medical History:  Past Medical History:  Diagnosis Date  . Anxiety   . Arthritis    neck  . Asthma   . Cervical spondylolysis   . Cervical spondylolysis   . Depression   . Hashimoto's disease   . Insulin resistance   . PONV (postoperative nausea and vomiting)    hasn't had surgery since 1993   Past Surgical History:  Past Surgical History:  Procedure Laterality Date  . ANTERIOR CERVICAL DECOMP/DISCECTOMY FUSION N/A 12/06/2017   Procedure: ANTERIOR CERVICAL DECOMPRESSION/DISCECTOMY FUSION, CERVICAL 3- CERVICAL 4, CERVICAL 4- CERVICAL 5, CERVICAL 5- CERVICAL 6;  Surgeon: [name], MD;  Location: [facility];  Service: Neurosurgery;  Laterality: N/A;  ANTERIOR CERVICAL DECOMPRESSION/DISCECTOMY FUSION, CERVICAL THREE- CERVICAL FOUR, CERVICAL FOUR- CERVICAL FIVE, CERVICAL FIVE- CERVICAL SIX  . APPENDECTOMY    . BREAST SURGERY     left biopsy  . CESAREAN SECTION     x2  . CHOLECYSTECTOMY    . DENTAL SURGERY     teeth removal  . OVARY SURGERY     ovarian wedge  . TONSILLECTOMY     Social History:  reports that she quit smoking about 37 years ago. She has never used smokeless tobacco. She reports that she drinks alcohol. She reports that she does not use drugs.  Family / Support  Systems Marital Status: Married Patient Roles: Spouse, Parent, Caregiver Spouse/Significant OtherLuiz Ochoa [phone] cell Children: Kinnie Scales [phone] Other Supports: neighbors and friends Anticipated Caregiver: Husband and daughter Ability/Limitations of Caregiver: Husband requires supervision level due to his history of CVA's and daughter is a Chief Technology Officer Caregiver Availability: 24/7 Family Dynamics: Close knit with their four children all are involved but one is local the others are out of state. Pt's sister is coming 9/19 to assist if needed. [name] stays with her during the day then daughter takes him home after work. Couple has been here for nine years from [place]  Social History Preferred language: English Religion: None Cultural Background: No issues Education: High School Read: Yes Write: Yes Employment Status: Retired Freight forwarder Issues: No issues  Guardian/Conservator: None-according to MD pt is capable of making her own decisions while here   Abuse/Neglect Abuse/Neglect Assessment Can Be Completed: Yes Physical Abuse: Denies Verbal Abuse: Denies Sexual Abuse: Denies Exploitation of patient/patient's resources: Denies Self-Neglect: Denies  Emotional Status Pt's affect, behavior adn adjustment status: Pt is motivated to do well but uis having pain today probably due to moving in therapies. This is her first day of therapies. She realizes it will be painful and is trying to be patient with herself. Husband is supportive and willing to assist pt. 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 Pyschiatric History: History  of anxiety takes medications for this whcih she finds helpful and is able to verbalize her concerns and feelings. She may benefit from seeing neuro-psych while here due to high level of stress she has. Substance Abuse History: No issues  Patient / Family  Perceptions, Expectations & Goals Pt/Family understanding of illness & functional limitations: Pt and husband can explain her back surgery and limitations. Both do talk with the MD and feel they have a good understanding of her treatment plan going forward. She is motivated to do well and get back home. Premorbid pt/family roles/activities: Wife, caregiver, mom, grandmother, retiree, church member, sibling, etc Anticipated changes in roles/activities/participation: resume Pt/family expectations/goals: Pt states: " I want to be able to move around on my own and not need much assist."  Husband states: " I can help if she tells me what to do, my memory is just bad."  [employer]: None Premorbid Home Care/DME Agencies: None Transportation available at discharge: Daughter Resource referrals recommended: Support group (specify), Neuropsychology  Discharge Planning Living Arrangements: Spouse/significant other Support Systems: Spouse/significant other, Children, Friends/neighbors, Immunologist, Other relatives Type of Residence: Private residence Insurance Resources: Multimedia programmer (specify), Medicare(Fed Nurse, mental health) Museum/gallery curator Resources: Fish farm manager, Other (Comment)(pension) Financial Screen Referred: No Living Expenses: Own Money Management: Patient Does the patient have any problems obtaining your medications?: No Home Management: Patient daughter to assist until she is able to return to this Patient/Family Preliminary Plans: Return home with husband who can assist and her sister is coming 9/19 to assist if needed. Daughter works during the day but in the evenings she can assist. Will work on discharge needs. Sw Barriers to Discharge: Decreased caregiver support Sw Barriers to Discharge Comments: Caregiver for husband who has a past history of CVA Social Work Anticipated Follow Up Needs: HH/OP, Support Group  Clinical Impression Pleasant couple who are  willing to assist one another. Pt is realistic regarding the pain she will have with her surgery and is trying to fight through it. Their local is involved and transports husband home from the hospital each day. Husband stays here with pt during the day. Pt's sister coming to assist 9/19, pt should be home by then if not before. Will work on discharge needs.  [name] 12/11/2017, 1:11 PM

## 2017-12-11 NOTE — Plan of Care (Signed)
  Problem: Consults Goal: RH GENERAL PATIENT EDUCATION Description See Patient Education module for education specifics. Outcome: Progressing Goal: Skin Care Protocol Initiated - if Braden Score 18 or less Description If consults are not indicated, leave blank or document N/A Outcome: Progressing Goal: Nutrition Consult-if indicated Outcome: Progressing Goal: Diabetes Guidelines if Diabetic/Glucose > 140 Description If diabetic or lab glucose is > 140 mg/dl - Initiate Diabetes/Hyperglycemia Guidelines & Document Interventions  Outcome: Progressing   Problem: RH BOWEL ELIMINATION Goal: RH STG MANAGE BOWEL WITH ASSISTANCE Description STG Manage Bowel with Assistance. Outcome: Progressing Flowsheets (Taken 12/11/2017 1409) STG: Pt will manage bowels with assistance: 4-Minimum assistance Goal: RH STG MANAGE BOWEL W/MEDICATION W/ASSISTANCE Description STG Manage Bowel with Medication with Assistance. Outcome: Progressing Flowsheets (Taken 12/11/2017 1409) STG: Pt will manage bowels with medication with assistance: 4-Minimal assistance   Problem: RH BLADDER ELIMINATION Goal: RH STG MANAGE BLADDER WITH ASSISTANCE Description STG Manage Bladder With Assistance Outcome: Progressing Flowsheets (Taken 12/11/2017 1409) STG: Pt will manage bladder with assistance: 4-Minimal assistance Goal: RH STG MANAGE BLADDER WITH MEDICATION WITH ASSISTANCE Description STG Manage Bladder With Medication With Assistance. Outcome: Progressing Flowsheets (Taken 12/11/2017 1409) STG: Pt will manage bladder with medication with assistance: 6-Modified independent Goal: RH STG MANAGE BLADDER WITH EQUIPMENT WITH ASSISTANCE Description STG Manage Bladder With Equipment With Assistance Outcome: Progressing Flowsheets (Taken 12/11/2017 1409) STG: Pt will manage bladder with equipment with assistance: 6-Modified independent   Problem: RH SKIN INTEGRITY Goal: RH STG SKIN FREE OF INFECTION/BREAKDOWN Outcome:  Progressing Goal: RH STG MAINTAIN SKIN INTEGRITY WITH ASSISTANCE Description STG Maintain Skin Integrity With Assistance. Outcome: Progressing Flowsheets (Taken 12/11/2017 1409) STG: Maintain skin integrity with assistance: 5-Supervision/set up Goal: RH STG ABLE TO PERFORM INCISION/WOUND CARE W/ASSISTANCE Description STG Able To Perform Incision/Wound Care With Assistance. Outcome: Progressing Flowsheets (Taken 12/11/2017 1409) STG: Pt will be able to perform incision/wound care with assistance: 5-Supervision/set up   Problem: RH SAFETY Goal: RH STG ADHERE TO SAFETY PRECAUTIONS W/ASSISTANCE/DEVICE Description STG Adhere to Safety Precautions With Assistance/Device. Outcome: Progressing Flowsheets (Taken 12/11/2017 1409) STG:Pt will adhere to safety precautions with assistance/device: 5-Supervision/set up Goal: RH STG DECREASED RISK OF FALL WITH ASSISTANCE Description STG Decreased Risk of Fall With Assistance. Outcome: Progressing Flowsheets (Taken 12/11/2017 1409) [id number] risk of fall  with assistance/device: 5-Supervison/set up   Problem: RH PAIN MANAGEMENT Goal: RH STG PAIN MANAGED AT OR BELOW PT'S PAIN GOAL Outcome: Progressing   Problem: RH KNOWLEDGE DEFICIT GENERAL Goal: RH STG INCREASE KNOWLEDGE OF SELF CARE AFTER HOSPITALIZATION Outcome: Progressing   Problem: RH Vision Goal: RH LTG Vision (Specify) Outcome: Progressing   Problem: RH Pre-functional (Specify) Goal: RH LTG Pre-functional (Specify) Outcome: Progressing   Problem: RH Other (Specify) Goal: RH LTG Interdisciplinary (Specify) 1 Description RH LTG Interdisciplinary (Specify)1 Outcome: Progressing Goal: RH LTG Interdisciplinary (Specify) 2 Description RH LTG Interdisciplinary (Specify) 2  Outcome: Progressing

## 2017-12-11 NOTE — Evaluation (Signed)
Speech Language Pathology Assessment and Plan  Patient Details  Name: Regina [name] MRN: [id number] Date of Birth: [birth year]  SLP Diagnosis:   N/A Rehab Potential: Excellent ELOS:   N/A   Today's Date: 12/11/2017 SLP Individual Time: 1430-1530 SLP Individual Time Calculation (min): 60 min   Problem List:  Patient Active Problem List   Diagnosis Date Noted  . Hypertensive crisis   . 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 Hypoalbuminemia due to protein-calorie malnutrition ([place])   . Acute blood loss anemia   . Myelopathy ([place]) 12/10/2017  . HNP (herniated nucleus pulposus) with myelopathy, cervical   . Essential hypertension   . Uncomplicated asthma   . Insulin resistance   . Hashimoto's disease   . Cervical spondylolysis   . Anxiety   . Hyponatremia 12/01/2017  . Hypothyroidism 12/01/2017   Past Medical History:  Past Medical History:  Diagnosis Date  . Anxiety   . Arthritis    neck  . Asthma   . Cervical spondylolysis   . Cervical spondylolysis   . Depression   . Hashimoto's disease   . Insulin resistance   . PONV (postoperative nausea and vomiting)    hasn't had surgery since 1993   Past Surgical History:  Past Surgical History:  Procedure Laterality Date  . ANTERIOR CERVICAL DECOMP/DISCECTOMY FUSION N/A 12/06/2017   Procedure: ANTERIOR CERVICAL DECOMPRESSION/DISCECTOMY FUSION, CERVICAL 3- CERVICAL 4, CERVICAL 4- CERVICAL 5, CERVICAL 5- CERVICAL 6;  Surgeon: [name], MD;  Location: [place];  Service: Neurosurgery;  Laterality: N/A;  ANTERIOR CERVICAL DECOMPRESSION/DISCECTOMY FUSION, CERVICAL THREE- CERVICAL FOUR, CERVICAL FOUR- CERVICAL FIVE, CERVICAL FIVE- CERVICAL SIX  . APPENDECTOMY    . BREAST SURGERY     left biopsy  . CESAREAN SECTION     x2  . CHOLECYSTECTOMY    . DENTAL SURGERY     teeth removal  . OVARY SURGERY     ovarian wedge  . TONSILLECTOMY      Assessment / Plan / Recommendation Clinical Impression Regina [name] is a 77 year old right-handed female with history of Hashimoto's disease, diabetes mellitus. Per chart review, patient, and husband, patient lives with spouse reported to be independent prior to admission.  One level home.  Husband with history of CVA and limited physically.  She has a daughter in the area that works.  Presented 12/01/2017 with recent work-up for hyponatremia as well as syncope.  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  Recent MRI and imaging revealed broad-based disc protrusion C3-4, 4-5 and C5-6 with cervical stenosis myelopathy.  Underwent discectomy C3-4, 4-5 and 5-6 for decompression of spinal cord existing nerve roots 12/06/2017.  Hospital course pain management.  Hard cervical collar in place.  Sodium level stable 129-131 and her HCTZ was held.  TSH levels were normal.  No evidence of adrenal insufficiency. Bouts of hypokalemia with supplement added.  Echocardiogram with ejection fraction of 65% no wall motion abnormalities.  Tolerating a regular diet.  Therapy evaluations completed.  Patient was admitted for a comprehensive rehab program.  Pt presents within functional limits of cognitive linguistic abilities, including complex problem solving, anticipatory awareness, higher level attention and delayed recall, supported by subsections of CLQT and baseline confirmed by husband/pt. Pt presents with appropriate swallow function, however due to pain, likely due to edema, pt prefers dys 2 diet for comfort. Pt demonstrated no swallow function impairment, no s/s aspiration on dys 3 trials and thin liquids via straw and  diet can be upgraded by physician and pt request with reduction of edema. Pt would not benefit from skilled ST services in order to maximize functional independence and reduce burden of care due to baseline ability.   Skilled Therapeutic Interventions          Skilled ST services focused on pt and family education. SLP  facilitated education of soft diet/dysphagia 2, providing handout reference, demonstrated how to select option on menu and place order. All questions answered to satisfaction. Pt was left in room with call bell within reach and husband in room. ST does not recommend further ST services.    SLP Assessment  Patient does not need any further Speech Lanaguage Pathology Services    Recommendations  SLP Diet Recommendations: Dysphagia 2 (Fine chop);Thin Liquid Administration via: Straw;Cup Medication Administration: Crushed with puree(crush large pills and small pills can be whole) Supervision: Patient able to self feed(set up assist) Postural Changes and/or Swallow Maneuvers: Seated upright 90 degrees;Out of bed for meals(out of bed prefered ) Oral Care Recommendations: Oral care BID Patient destination: Home Follow up Recommendations: None Equipment Recommended: None recommended by SLP    SLP Frequency   N/A  SLP Duration  SLP Intensity  SLP Treatment/Interventions  N/A   N/A    N/A   Pain Pain Assessment Pain Scale: 0-10 Pain Score: 0-No pain Faces Pain Scale: Hurts a little bit Pain Type: Acute pain Pain Location: Shoulder Pain Intervention(s): Medication (See eMAR);Repositioned  Prior Functioning Cognitive/Linguistic Baseline: Within functional limits Type of Home: House  Lives With: Spouse Available Help at Discharge: Family;Available PRN/intermittently Vocation: Part time employment  Function:  Eating Eating   Modified Consistency Diet: Yes(dys 2 prefernece due to edema no swallow impairment) Eating Assist Level: Set up assist for   Eating Set Up Assist For: Opening containers       Cognition Comprehension Comprehension assist level: Understands complex 90% of the time/cues 10% of the time  Expression   Expression assist level: Expresses complex ideas: With no assist  Social Interaction Social Interaction assist level: Interacts appropriately with others -  No medications needed.  Problem Solving Problem solving assist level: Solves complex 90% of the time/cues < 10% of the time;Solves complex problems: With extra time(impacted by pain medication)  Memory Memory assist level: Recognizes or recalls 90% of the time/requires cueing < 10% of the time   Short Term Goals: No short term goals set  Refer to Care Plan for Long Term Goals  Recommendations for other services: None   Discharge Criteria: Patient will be discharged from SLP if patient refuses treatment 3 consecutive times without medical reason, if treatment goals not met, if there is a change in medical status, if patient makes no progress towards goals or if patient is discharged from hospital.  The above assessment, treatment plan, treatment alternatives and goals were discussed and mutually agreed upon: by patient and by family  [place]  [facility] 12/11/2017, 3:45 PM

## 2017-12-11 NOTE — Progress Notes (Signed)
*  Preliminary Results* Bilateral lower extremity venous duplex completed. Bilateral lower extremities are negative for deep vein thrombosis. There is no evidence of Baker's cyst bilaterally.  12/11/2017 9:36 AM  Abram Sander

## 2017-12-12 ENCOUNTER — Inpatient Hospital Stay (HOSPITAL_COMMUNITY): Payer: Federal, State, Local not specified - PPO | Admitting: Physical Therapy

## 2017-12-12 ENCOUNTER — Inpatient Hospital Stay (HOSPITAL_COMMUNITY): Payer: Federal, State, Local not specified - PPO | Admitting: Occupational Therapy

## 2017-12-12 DIAGNOSIS — G8918 Other acute postprocedural pain: Secondary | ICD-10-CM

## 2017-12-12 LAB — GLUCOSE, CAPILLARY
Glucose-Capillary: 107 mg/dL — ABNORMAL HIGH (ref 70–99)
Glucose-Capillary: 121 mg/dL — ABNORMAL HIGH (ref 70–99)
Glucose-Capillary: 95 mg/dL (ref 70–99)
Glucose-Capillary: 92 mg/dL (ref 70–99)

## 2017-12-12 NOTE — Progress Notes (Signed)
Social Work Patient ID: Regina [name], female   DOB: 1940/09/20, 77 y.o.   MRN: [id number]  Met with pt and husband to discuss team conference goals supervision-mod/i level and target discharge 9/19. Pt feels she has a long way to go with her balance issues. She has made progress in the past two days. Husband is here and supportive and cheers her on. Gave them [facility] transportation option for rides her in the am. Work on discharge needs. Pt is pushing herself each day.

## 2017-12-12 NOTE — Progress Notes (Signed)
[place] PHYSICAL MEDICINE & REHABILITATION     PROGRESS NOTE  Subjective/Complaints:  Patient seen sitting up in bed this morning. She states she slept well overnight. She states she had a good first in therapies yesterday.  ROS: denies CP, SOB, nausea, vomiting, diarrhea.  Objective: Vital Signs: Blood pressure (!) 143/69, pulse 80, temperature 98.9 F (37.2 C), temperature source Oral, resp. rate 16, SpO2 99 %. No results found. Recent Labs    12/10/17 2312  WBC 10.0  HGB 11.0*  HCT 33.5*  PLT 396   Recent Labs    12/10/17 0206 12/10/17 2312  NA 131* 133*  K 3.4* 4.2  CL 98 99  GLUCOSE 97 109*  BUN 10 12  CREATININE 0.71 0.81  CALCIUM 8.7* 9.0   CBG (last 3)  Recent Labs    12/11/17 1641 12/11/17 2114 12/12/17 0702  GLUCAP 99 99 107*    Wt Readings from Last 3 Encounters:  12/01/17 72.8 kg  11/29/17 71.9 kg  02/17/13 81.2 kg    Physical Exam:  BP (!) 143/69 (BP Location: Left Arm)   Pulse 80   Temp 98.9 F (37.2 C) (Oral)   Resp 16   SpO2 99%  Constitutional: She appears well-developed and well-nourished.  HENT: Normocephalic and atraumatic.  Eyes: EOM are normal. No discharge. Neck: Cervical collar in place  Cardiovascular: RRR. No  JVD.  Respiratory: Effort normal and breath sounds normal.  GI: Bowel sounds are normal. She exhibits no distension.  Musculoskeletal: No edema or tenderness in extremities  Neurological: She is alert and oriented.  Follows full commands Motor: Bilateral upper extremities: Shoulder flexion, elbow flexion/extension, wrist extension 4/5, hand grip 4 -/5, nchanged Bilateral lower extremities: 4+/5 proximal distal, unchanged Skin: Skin is warm and dry.  Psychiatric: She has a normal mood and affect. Her behavior is normal.   Assessment/Plan: 1. 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 Physiatrist is providing close team supervision and 24 hour management of active medical problems listed below. Physiatrist and rehab team continue to assess barriers to discharge/monitor patient progress toward functional and medical goals.  Function:  Bathing Bathing position   Position: Shower  Bathing parts Body parts bathed by patient: Right arm, Left arm, Chest, Abdomen, Front perineal area, Buttocks, Right upper leg, Left lower leg, Right lower leg, Left upper leg Body parts bathed by helper: Back  Bathing assist Assist Level: Touching or steadying assistance(Pt > 75%)      Upper Body Dressing/Undressing Upper body dressing   What is the patient wearing?: Pull over shirt/dress     Pull over shirt/dress - Perfomed by patient: Thread/unthread right sleeve, Thread/unthread left sleeve, Pull shirt over trunk Pull over shirt/dress - Perfomed by helper: Put head through opening        Upper body assist Assist Level: Touching or steadying assistance(Pt > 75%)      Lower Body Dressing/Undressing Lower body dressing   What is the patient wearing?: Underwear, Pants, Non-skid slipper socks, Shoes Underwear - Performed by patient: Thread/unthread right underwear leg, Thread/unthread left underwear leg, Pull underwear up/down   Pants- Performed by patient: Thread/unthread right pants leg, Thread/unthread left pants leg, Pull pants up/down   Non-skid slipper socks- Performed by patient: Don/doff right sock, Don/doff left sock       Shoes - Performed by patient: Don/doff right shoe, Don/doff left shoe  Lower body assist Assist for lower body dressing: Touching or steadying assistance (Pt > 75%)      Toileting Toileting   Toileting steps completed by patient: Adjust clothing prior to toileting, Performs perineal hygiene, Adjust clothing after toileting   Toileting Assistive Devices: Grab bar or rail  Toileting  assist Assist level: Touching or steadying assistance (Pt.75%)   Transfers Chair/bed transfer   Chair/bed transfer method: Stand pivot Chair/bed transfer assist level: Touching or steadying assistance (Pt > 75%) Chair/bed transfer assistive device: Armrests, Medical sales representative     Max distance: 75 Assist level: Touching or steadying assistance (Pt > 75%)   Wheelchair Wheelchair activity did not occur: N/A        Cognition Comprehension Comprehension assist level: Understands complex 90% of the time/cues 10% of the time  Expression Expression assist level: Expresses complex ideas: With no assist  Social Interaction Social Interaction assist level: Interacts appropriately with others - No medications needed.  Problem Solving Problem solving assist level: Solves complex 90% of the time/cues < 10% of the time, Solves complex problems: With extra time(impacted by pain medication)  Memory Memory assist level: Recognizes or recalls 90% of the time/requires cueing < 10% of the time    Medical Problem List and Plan: 1.  Decreased functional mobility secondary to cervical stenosis/spondylosis/myelopathy.  Status post discectomy C3-4, 4-5 and 5-6 for decompression of spinal cord and existing nerve roots 12/06/2017.  Cervical collar  Continue CIR 2.  DVT Prophylaxis/Anticoagulation: SCDs  Vascular study negative for DVT   Oh consider chemical anticoagulation 3. Pain Management: Skelaxin and oxycodone as needed 4. Mood: Lexapro 10 mg nightly, Xanax 0.25 mg 3 times daily as needed 5. Neuropsych: This patient is capable of making decisions on her own behalf. 6. Skin/Wound Care: Routine skin checks 7. Fluids/Electrolytes/Nutrition: Routine in and outs  8. Hyponatremia: HCTZ held.    Sodium 133 on 9/9  Continue sodium chloride tablets   Continue to monitor 9.  Bouts of syncope.  Monitor blood pressure when out of bed.  Echocardiogram with ejection fraction of 65%. 10.   Hypertension.  Hydralazine 50 mg every 8 hours  Labile, but? Improving  Monitor with increased mobility 11.  History of diabetes mellitus.   Patient on Glucophage 850 mg twice daily prior to admission. Check CBG ac/HS. Resume as needed  Relatively controlled on 9/11  Monitor with increased mobility 12.  Hypothyroidism.  Synthroid 13.  Asthma/COPD.  Continue nebulizers and Singulair 14.  Constipation.  Laxative assistance 15. Hypoalbuminemia  Supplement initiated on 9/10 16. Acute blood loss anemia  Hemoglobin 11.0 on 9/10  Continue to monitor  LOS (Days) 2 A FACE TO FACE EVALUATION WAS PERFORMED  Margarette [name] Lorie Phenix 12/12/2017 9:07 AM

## 2017-12-12 NOTE — Progress Notes (Signed)
Physical Therapy Session Note  Patient Details  Name: Regina [name] MRN: [id number] Date of Birth: [birth year]  Today's Date: 12/12/2017 PT Individual Time: 4098-1191 AND [id number] PT Individual Time Calculation (min): 27 minAND 59    Short Term Goals: Week 1:  PT Short Term Goal 1 (Week 1): Pt will perform bed mobility with S from flat bed PT Short Term Goal 2 (Week 1): Pt will perform transfers with S PT Short Term Goal 3 (Week 1): Pt will perform gait with LRAD x150' min guard PT Short Term Goal 4 (Week 1): Pt will ascend/descent 12 steps with B handrails and min guard  Skilled Therapeutic Interventions/Progress Updates:  Session 1.   Pt received sitting in WC and agreeable to PT.   Gait training with RW x 152f and CGA assist from PT. Min cues for Ad management to prevent veer to the L throughout gait training as well as attention to obstacles on the L.   Sit<>stand transfers throughout treatment with min assist progressing to to supervision assist from PT. Pt required min cues for improved use of UE to [facility] from [facility] Pa[facility] proper LE placement.   Sit<>supine transfer to mat table with min assist to supine and mod assist to sitting EOB.  from PT to control LE and maintain neutral spinal alignment.   Supine therex for BLE :  SAQ, x15 BLE Clam shells x 12  Reciprocal marches x 10 BLE   Calf press x 15 Min cues for proper ROM, and speed to improve strengthening aspects of movement.   PT instructed pt in BWhatleytest Patient demonstrates increased fall risk as noted by score of  24/56 on Berg Balance Scale.  (<36= high risk for falls, close to 100%; 37-45 significant >80%; 46-51 moderate >50%; 52-55 lower >25%)  Patient returned to room and left sitting in recliner with call bell in reach and all needs met.     Session 2.   Pt received sitting in WC and agreeable to PT  Stand [facility] to [facility] assist and no AD. [facility] transfer  to Nustep with CGA and RW. Reciprocal movement training on Nustep level 4, x 8 min with min cues throughout for improved symmetry of movement and increased speed. Ambulatory transfer to Recliner at end of session with min assist and RW. Min cues for safety and gait pattern in turn to chair.   Pt left sitting in recliner with husband present and all needs met.       Therapy Documentation Precautions:  Precautions Precautions: Cervical, Fall Precaution Comments: Per MD orders: pt can take off brace for showering and walking to the bathroom  Required Braces or Orthoses: Cervical Brace Cervical Brace: Hard collar Restrictions Weight Bearing Restrictions: No Vital Signs: Therapy Vitals Temp: 99.5 F (37.5 C) Temp Source: Oral Pulse Rate: 76 Resp: 18 BP: (!) 119/50 Patient Position (if appropriate): Sitting Oxygen Therapy SpO2: 99 % O2 Device: Room Air Pain: Pain Assessment Pain Scale: 0-10 Pain Score: 7  Pain Type: Acute pain Pain Location: Neck Pain Orientation: Posterior Pain Radiating Towards: left shoulder Pain Descriptors / Indicators: Aching Pain Frequency: Constant Pain Onset: On-going Pain Intervention(s): Medication (See eMAR)   See Function Navigator for Current Functional Status.   Therapy/Group: Individual Therapy  ALorie Phenix9/02/2018, 4:00 PM

## 2017-12-12 NOTE — Patient Care Conference (Signed)
Inpatient RehabilitationTeam Conference and Plan of Care Update Date: 12/12/2017   Time: 2:31 PM    Patient Name: Regina [name]      Medical Record Number: [id number]  Date of Birth: [birth year] Sex: Female         Room/Bed: 4M11C/4M11C-01 Payor Info: Payor: MEDICARE / Plan: MEDICARE PART A / Product Type: *No Product type* /    Admitting Diagnosis: Cervical Myelopathy  Admit Date/Time:  12/10/2017  4:53 PM Admission Comments: No comment available   Primary Diagnosis:  <principal problem not specified> Principal Problem: <principal problem not specified>  Patient Active Problem List   Diagnosis Date Noted  . Benign essential HTN   . Postoperative pain   . Hypertensive crisis   . 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 Hypoalbuminemia due to protein-calorie malnutrition ([place])   . Acute blood loss anemia   . Myelopathy ([place]) 12/10/2017  . HNP (herniated nucleus pulposus) with myelopathy, cervical   . Essential hypertension   . Uncomplicated asthma   . Insulin resistance   . Hashimoto's disease   . Cervical spondylolysis   . Anxiety   . Hyponatremia 12/01/2017  . Hypothyroidism 12/01/2017    Expected Discharge Date: Expected Discharge Date: 12/20/17  Team Members Present: Physician leading conference: Dr. Delice Lesch Social Worker Present: Ovidio Kin, LCSW Nurse Present: Dorien Chihuahua, RN PT Present: Kem Parkinson, PT PPS Coordinator present : Ileana Ladd, PT     Current Status/Progress Goal Weekly Team Focus  Medical    Decreased functional mobility secondary to cervical stenosis/spondylosis/myelopathy.  Status post discectomy C3-4, 4-5 and 5-6 for decompression of spinal cord and existing nerve roots 12/06/2017.  Cervical collar  Improve mobility, transfers, BP, sodium  See above   Bowel/Bladder   continent of b/b with min A  continent of b/b with Mod I A  monitor b/b q shift and prn, administer meds as ordered    Swallow/Nutrition/  Hydration             ADL's   Supervision UB bathing/dressing and grooming; min A functional transfers, CGA toileting, LB dressing, min A functional transfers  Mod I overall, supervision shower transfer, bathing and simple meal prep  ADL/IADL re-training, pain management, functional activity tolerance   Mobility   min guard with RW  modI transfers, S gait with LRAD  dynamic standing balance/gait, activity tolerance   Communication             Safety/Cognition/ Behavioral Observations            Pain   oxy 5mg  for neck/shoulder pain q 3 hours prn, pt takes 1-2 times per shift  pain <2   monitor pain q shift and prn   Skin   incision anterior  ota, aspen collar  maintain skin with no breakdown while on IPR  monitor skin q shift and prn      *See Care Plan and progress notes for long and short-term goals.     Barriers to Discharge  Current Status/Progress Possible Resolutions Date Resolved   Physician    Decreased caregiver support;Medical stability;Lack of/limited family support     See above  Therapies, optimize BP meds, follow labs      Nursing                  PT  Decreased caregiver support;Lack of/limited family support  husband with baseline cognitive deficits due to multiple CVAs, daughter only available intermittently  OT                  SLP                SW Decreased caregiver support Caregiver for husband who has a past history of CVA            Discharge Planning/Teaching Needs:  Home with husband who she is the caregiver for due to cognition issues from past CVA-2015. She needs to be mod/i-supervision level to go home safely      Team Discussion:  Goals supervision-mod/i level. Needs cueing at times. Pain is an issue and limits her in therapies. Sodium trending up and monitoring BP. Dys 2 diet due to swallowing issues due to pain and swollen throat. Fatigues quickly.   Revisions to Treatment Plan:  DC 9/19    Continued Need for Acute  Rehabilitation Level of Care: The patient requires daily medical management by a physician with specialized training in physical medicine and rehabilitation for the following conditions: Daily direction of a multidisciplinary physical rehabilitation program to ensure safe treatment while eliciting the highest outcome that is of practical value to the patient.: Yes Daily medical management of patient stability for increased activity during participation in an intensive rehabilitation regime.: Yes Daily analysis of laboratory values and/or radiology reports with any subsequent need for medication adjustment of medical intervention for : Neurological problems;Blood pressure problems;Other   I attest that I was present, lead the team conference, and concur with the assessment and plan of the team.   [name] 12/13/2017, 2:31 PM

## 2017-12-12 NOTE — Progress Notes (Signed)
Physical Therapy Session Note  Patient Details  Name: Regina [name] MRN: [id number] Date of Birth: [birth year]  Today's Date: 12/12/2017 PT Individual Time: 1430-1530 PT Individual Time Calculation (min): 60 min   Short Term Goals: Week 1:  PT Short Term Goal 1 (Week 1): Pt will perform bed mobility with S from flat bed PT Short Term Goal 2 (Week 1): Pt will perform transfers with S PT Short Term Goal 3 (Week 1): Pt will perform gait with LRAD x150' min guard PT Short Term Goal 4 (Week 1): Pt will ascend/descent 12 steps with B handrails and min guard  Skilled Therapeutic Interventions/Progress Updates: Pt received seated in recliner, husband present; c/o LE soreness and agreeable to treatment. Stand pivot recliner <>w/c at beginning/end of session with min guard, no AD. Transported totalA via w/c to gym for energy conservation. Performed agility ladder no AD with min/modA for forward stepping, HHA with min guard side stepping. Sit <>stand x10 reps no UE support with S. Standing balance on airex foam pad with no UE support, normal and narrow BOS eyes open/closed; increased instability and sway envelope with narrow BOS and both eyes closed conditions. Standing heel/toe raises 2x10 reps each with UE support. Returned to room totalA. Remained in recliner at end of session, all needs in reach.      Therapy Documentation Precautions:  Precautions Precautions: Cervical, Fall Precaution Comments: Per MD orders: pt can take off brace for showering and walking to the bathroom  Required Braces or Orthoses: Cervical Brace Cervical Brace: Hard collar Restrictions Weight Bearing Restrictions: No  See Function Navigator for Current Functional Status.   Therapy/Group: Individual Therapy  Corliss Skains 12/12/2017, 3:38 PM

## 2017-12-12 NOTE — Progress Notes (Signed)
Occupational Therapy Session Note  Patient Details  Name: Regina [name] MRN: [id number] Date of Birth: [birth year]  Today's Date: 12/12/2017 OT Individual Time: 7062-3762 OT Individual Time Calculation (min): 42 min    Short Term Goals: Week 1:  OT Short Term Goal 1 (Week 1): Pt will perform sit to stand transfers with supervision for self care tasks  OT Short Term Goal 2 (Week 1): Pt will perform UB dressing with supervision assist  OT Short Term Goal 3 (Week 1): Pt will perform LB dressing with supervision assist   OT Short Term Goal 4 (Week 1): Pt will retrieve dressing items needed for dressing with LRAD and close supervision    Skilled Therapeutic Interventions/Progress Updates:    Upon entering the room, pt seated in recliner chair and positioned for comfort. Pt appearing to be grimacing while seated in chair and RN notified of increased pain in L shoulder. Pt requesting to use bathroom. Increased time and min A for ambulation 10' with RW into bathroom. Pt required min A for standing balance during LB clothing management. Pt having BM and requiring assistance for hygiene for thoroughness. Pt standing at sink with RW to wash hands and brush teeth with steady assistance for balance. Pt returning to recliner chair with continued reports of increased pain. OT placed ice over R shoulder for pain management and began education regarding energy conservation with self care tasks. Education to continue. Pt remained seated in recliner chair with chair alarm activated and call bell within reach.   Therapy Documentation Precautions:  Precautions Precautions: Cervical, Fall Precaution Comments: Per MD orders: pt can take off brace for showering and walking to the bathroom  Required Braces or Orthoses: Cervical Brace Cervical Brace: Hard collar Restrictions Weight Bearing Restrictions: No  Pain: Pain Assessment Pain Scale: 0-10 Pain Score: 5  Pain Type: Acute pain Pain Location:  Shoulder Pain Orientation: Left Pain Descriptors / Indicators: Aching;Stabbing;Throbbing Pain Frequency: Intermittent Pain Onset: With Activity Patients Stated Pain Goal: 3 Pain Intervention(s): Medication (See eMAR);Repositioned(oxycodone 5mg  po skelaxin )  See Function Navigator for Current Functional Status.   Therapy/Group: Individual Therapy  Gypsy Decant 12/12/2017, 12:53 PM

## 2017-12-13 ENCOUNTER — Inpatient Hospital Stay (HOSPITAL_COMMUNITY): Payer: Federal, State, Local not specified - PPO | Admitting: Physical Therapy

## 2017-12-13 ENCOUNTER — Inpatient Hospital Stay (HOSPITAL_COMMUNITY): Payer: Federal, State, Local not specified - PPO | Admitting: Occupational Therapy

## 2017-12-13 DIAGNOSIS — I10 Essential (primary) hypertension: Secondary | ICD-10-CM

## 2017-12-13 LAB — GLUCOSE, CAPILLARY
Glucose-Capillary: 103 mg/dL — ABNORMAL HIGH (ref 70–99)
Glucose-Capillary: 131 mg/dL — ABNORMAL HIGH (ref 70–99)
Glucose-Capillary: 94 mg/dL (ref 70–99)
Glucose-Capillary: 123 mg/dL — ABNORMAL HIGH (ref 70–99)

## 2017-12-13 MED ORDER — SENNOSIDES-DOCUSATE SODIUM 8.6-50 MG PO TABS
1.0000 | ORAL_TABLET | Freq: Two times a day (BID) | ORAL | Status: DC
  Administered 2017-12-13 – 2017-12-20 (×15): 1 via ORAL
  Filled 2017-12-13 (×15): qty 1

## 2017-12-13 NOTE — Progress Notes (Signed)
Physical Therapy Session Note  Patient Details  Name: Regina [name] MRN: [id number] Date of Birth: May 12, 1940  Today's Date: 12/13/2017 PT Concurrent Time: 1030-1200 PT Concurrent Time Calculation (min): 90 min  Short Term Goals: Week 1:  PT Short Term Goal 1 (Week 1): Pt will perform bed mobility with S from flat bed PT Short Term Goal 2 (Week 1): Pt will perform transfers with S PT Short Term Goal 3 (Week 1): Pt will perform gait with LRAD x150' min guard PT Short Term Goal 4 (Week 1): Pt will ascend/descent 12 steps with B handrails and min guard  Skilled Therapeutic Interventions/Progress Updates: Pt participated in concurrent treatment session with PT, recreational therapist and one other patient. Focus on community ambulation, activity tolerance including gait on level/unlevel surfaces with RW and min guard. Ambulates with slow speed, forward flexed posture and repetitive cues for maintaining RW closer to body to prevent LOB. Discussed community access, requesting help as needed, awareness of obstacles and hazards. Ambulated through obstacle course and with grocery cart for focus on dynamic gait with RW, activity tolerance. Remained in recliner at end of session; chair alarm intact and all needs in reach.       Therapy Documentation Precautions:  Precautions Precautions: Cervical, Fall Precaution Comments: Per MD orders: pt can take off brace for showering and walking to the bathroom  Required Braces or Orthoses: Cervical Brace Cervical Brace: Hard collar Restrictions Weight Bearing Restrictions: No Pain: Pain Assessment Pain Scale: 0-10 Pain Score: 6  Pain Type: Acute pain Pain Location: Neck Pain Orientation: Posterior Pain Radiating Towards: left shoulder Pain Descriptors / Indicators: Aching Pain Frequency: Constant Pain Onset: On-going Pain Intervention(s): Medication (See eMAR)   See Function Navigator for Current Functional Status.   Therapy/Group:  concurrent  Corliss Skains 12/13/2017, 1:56 PM

## 2017-12-13 NOTE — Progress Notes (Signed)
Occupational Therapy Session Note  Patient Details  Name: Regina [name] MRN: [id number] Date of Birth: 05-16-1940  Today's Date: 12/13/2017 OT Individual Time: 0800-0900 (60 mins)       Short Term Goals: Week 1:  OT Short Term Goal 1 (Week 1): Pt will perform sit to stand transfers with supervision for self care tasks  OT Short Term Goal 2 (Week 1): Pt will perform UB dressing with supervision assist  OT Short Term Goal 3 (Week 1): Pt will perform LB dressing with supervision assist   OT Short Term Goal 4 (Week 1): Pt will retrieve dressing items needed for dressing with LRAD and close supervision   Skilled Therapeutic Interventions/Progress Updates:    Pt seated in recliner upon entry eager to begin therapy and shower with no reports of pain. Pt performed all functional transfers this session with close supervision-CGA dependent on fatigue levels with RW. Pt ambulated to bed and chose clothing needed from bag while standing and then ambulated to tub transfer bench. Pt performed bathing tasks while seated except for buttocks area with pt attempting to complete but unable due to shoulder ROM requiring therapist for thoroughness. Pt with noted improvements this session with hand manipulation and grip of shower supplies. Pt ambulated with close supervision to w/c at sink and performed dressing tasks. Pt required CGA in standing for pulling LB dressing up with pt reporting she could not feel in her fingers that her waistband was bunched requiring therapist to assist. Pt performed multiple sit to stands at sink with RW and completed grooming tasks and cleaned cervical replacement pads standing at sink with noted fatigue at end of standing tasks. Pt ambulated back to recliner and left in room with all needs in reach and chair alarm set.   Continued education regarding energy conservation at home with grooming tasks and hygiene associated with replacing cervical replacement pads. Discussed  with pt regarding bathroom setup with pt reporting she has a small ledge to step over and into walk in shower. Plan to practice shower stall transfer in future session.    Therapy Documentation Precautions:  Precautions Precautions: Cervical, Fall Precaution Comments: Per MD orders: pt can take off brace for showering and walking to the bathroom  Required Braces or Orthoses: Cervical Brace Cervical Brace: Hard collar Restrictions Weight Bearing Restrictions: No   See Function Navigator for Current Functional Status.   Therapy/Group: Individual Therapy  Kirklin Mcduffee 12/13/2017, 9:15 AM

## 2017-12-13 NOTE — Progress Notes (Signed)
[facility] PH[facility]ICINE & REHABILITATION     PROGRESS NOTE  Subjective/Complaints:  Pt seen sitting up in her chair this AM.  She slept well overnight.  She has questions about edema.   ROS: Denies CP, SOB, nausea, vomiting, diarrhea.  Objective: Vital Signs: Blood pressure (!) 163/68, pulse 71, temperature 98.5 F (36.9 C), resp. rate 16, SpO2 99 %. No results found. Recent Labs    12/10/17 2312  WBC 10.0  HGB 11.0*  HCT 33.5*  PLT 396   Recent Labs    12/10/17 2312  NA 133*  K 4.2  CL 99  GLUCOSE 109*  BUN 12  CREATININE 0.81  CALCIUM 9.0   CBG (last 3)  Recent Labs    12/12/17 1653 12/12/17 2130 12/13/17 0711  GLUCAP 95 92 103*    Wt Readings from Last 3 Encounters:  12/01/17 72.8 kg  11/29/17 71.9 kg  02/17/13 81.2 kg    Physical Exam:  BP (!) 163/68 (BP Location: Right Arm)   Pulse 71   Temp 98.5 F (36.9 C)   Resp 16   SpO2 99%  Constitutional: She appears well-developed and well-nourished.  HENT: Normocephalic and atraumatic.  Eyes: EOM are normal. No discharge. Neck: Cervical collar in place  Cardiovascular: RRR. No  JVD.  Respiratory: Effort normal and breath sounds normal.  GI: Bowel sounds are normal. She exhibits no distension.  Musculoskeletal: LE edema  Neurological: She is alert and oriented.  Follows full commands Motor: Bilateral upper extremities: Shoulder flexion, elbow flexion/extension, wrist extension 4/5, hand grip 4/5 Bilateral lower extremities: 4+/5 proximal distal, stable Skin: Skin is warm and dry.  Psychiatric: She has a normal mood and affect. Her behavior is normal.   Assessment/Plan: 1. 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 Physiatrist is providing close team supervision and 24 hour management of active medical problems listed  below. Physiatrist and rehab team continue to assess barriers to discharge/monitor patient progress toward functional and medical goals.  Function:  Bathing Bathing position   Position: Shower  Bathing parts Body parts bathed by patient: Right arm, Left arm, Chest, Abdomen, Front perineal area, Right upper leg, Left lower leg, Right lower leg, Left upper leg Body parts bathed by helper: Back, Buttocks  Bathing assist Assist Level: Touching or steadying assistance(Pt > 75%)      Upper Body Dressing/Undressing Upper body dressing   What is the patient wearing?: Pull over shirt/dress Bra - Perfomed by patient: Thread/unthread right bra strap, Thread/unthread left bra strap, Hook/unhook bra (pull down sports bra)   Pull over shirt/dress - Perfomed by patient: Thread/unthread right sleeve, Thread/unthread left sleeve, Pull shirt over trunk, Put head through opening Pull over shirt/dress - Perfomed by helper: Put head through opening        Upper body assist Assist Level: Supervision or verbal cues      Lower Body Dressing/Undressing Lower body dressing   What is the patient wearing?: Underwear, Pants, Shoes, Socks Underwear - Performed by patient: Thread/unthread right underwear leg, Thread/unthread left underwear leg, Pull underwear up/down   Pants- Performed by patient: Thread/unthread right pants leg, Thread/unthread left pants leg, Pull pants up/down   Non-skid slipper socks- Performed by patient: Don/doff right sock, Don/doff left sock       Shoes - Performed by patient: Don/doff right shoe, Don/doff left shoe  Lower body assist Assist for lower body dressing: Touching or steadying assistance (Pt > 75%)      Toileting Toileting   Toileting steps completed by patient: Adjust clothing prior to toileting, Performs perineal hygiene, Adjust clothing after toileting Toileting steps completed by helper: Adjust clothing prior to toileting, Performs perineal hygiene,  Adjust clothing after toileting(per Thea Alken, NT report) Toileting Assistive Devices: Grab bar or rail  Toileting assist Assist level: Touching or steadying assistance (Pt.75%)   Transfers Chair/bed transfer   Chair/bed transfer method: Stand pivot Chair/bed transfer assist level: Touching or steadying assistance (Pt > 75%)(per Linden Dolin, NT report) Chair/bed transfer assistive device: Armrests, Medical sales representative     Max distance: 116ft Assist level: Touching or steadying assistance (Pt > 75%)   Wheelchair Wheelchair activity did not occur: N/A        Cognition Comprehension Comprehension assist level: Understands complex 90% of the time/cues 10% of the time  Expression Expression assist level: Expresses complex ideas: With no assist  Social Interaction Social Interaction assist level: Interacts appropriately with others - No medications needed.  Problem Solving Problem solving assist level: Solves complex 90% of the time/cues < 10% of the time, Solves complex problems: With extra time  Memory Memory assist level: Recognizes or recalls 90% of the time/requires cueing < 10% of the time    Medical Problem List and Plan: 1.  Decreased functional mobility secondary to cervical stenosis/spondylosis/myelopathy.  Status post discectomy C3-4, 4-5 and 5-6 for decompression of spinal cord and existing nerve roots 12/06/2017.  Cervical collar  Continue CIR 2.  DVT Prophylaxis/Anticoagulation: SCDs  Vascular study negative for DVT  3. Pain Management: Skelaxin and oxycodone as needed 4. Mood: Lexapro 10 mg nightly, Xanax 0.25 mg 3 times daily as needed 5. Neuropsych: This patient is capable of making decisions on her own behalf. 6. Skin/Wound Care: Routine skin checks 7. Fluids/Electrolytes/Nutrition: Routine in and outs  8. Hyponatremia: HCTZ held.    Sodium 133 on 9/9  Labs ordered for tomorrow  Continue sodium chloride tablets, will consider  d/cing  Continue to monitor 9.  Bouts of syncope.  Monitor blood pressure when out of bed.  Echocardiogram with ejection fraction of 65%. 10.  Hypertension.  Hydralazine 50 mg every 8 hours  Elevated, likely due to fluid retention  Will consider lasix  Monitor with increased mobility 11.  History of diabetes mellitus.   Patient on Glucophage 850 mg twice daily prior to admission. Check CBG ac/HS. Resume as needed  Relatively controlled on 9/12  Monitor with increased mobility 12.  Hypothyroidism.  Synthroid 13.  Asthma/COPD.  Continue nebulizers and Singulair 14.  Constipation.  Laxative assistance 15. Hypoalbuminemia  Supplement initiated on 9/10 16. Acute blood loss anemia  Hemoglobin 11.0 on 9/10  Continue to monitor  LOS (Days) 3 A FACE TO FACE EVALUATION WAS PERFORMED  Ankit Lorie Phenix 12/13/2017 9:50 AM

## 2017-12-13 NOTE — Progress Notes (Signed)
Occupational Therapy Session Note  Patient Details  Name: Regina [name] MRN: [id number] Date of Birth: [birth year]  Today's Date: 12/13/2017 OT Individual Time: 1194-1740 OT Individual Time Calculation (min): 45 min    Short Term Goals: Week 1:  OT Short Term Goal 1 (Week 1): Pt will perform sit to stand transfers with supervision for self care tasks  OT Short Term Goal 2 (Week 1): Pt will perform UB dressing with supervision assist  OT Short Term Goal 3 (Week 1): Pt will perform LB dressing with supervision assist   OT Short Term Goal 4 (Week 1): Pt will retrieve dressing items needed for dressing with LRAD and close supervision   Skilled Therapeutic Interventions/Progress Updates:    PT seen for OT session focusing on functional ambulation, transfers, and LE strengthening in prep for functional transfers. Pt sitting up in recliner upon arrival with husband present, complaints of fatigue from previous sessions, though declining pain and agreeable to cont with tx session as able. She completed stand pivot transfer to w/c with min A for power into standing using RW. Pt taken to ADL apartment total A in w/c for time and energy conservation. Extensive education provided to pt and husband regarding seating options for shower stall at home. Recommend shower chair with armrests in order to assist with sit>stand and controlled sit. Pt shown BSC option to have in shower as well options for private purchase on internet. Pt plans to private purchase shower chair in order to use 3-1 BSC over standard toilet at home. Following demonstration and education for technique for shower stall transfer with RW, pt return demonstrated with CGA.  Returned to w/c and taken to therapy gym. Seated EOM, pt completed x2 sets of 5 sit>stand without UE support to address LE strengthening. Mat lowered on subsequent trials. Completed with guarding assist and rest break provided btwn trials. Pt ambulated ~38ft back  towards room at end of session with CGA. Pt left seated in w/c at end of session with husband and daughter present, all needs in reach.   Therapy Documentation Precautions:  Precautions Precautions: Cervical, Fall Precaution Comments: Per MD orders: pt can take off brace for showering and walking to the bathroom  Required Braces or Orthoses: Cervical Brace Cervical Brace: Hard collar Restrictions Weight Bearing Restrictions: No  See Function Navigator for Current Functional Status.   Therapy/Group: Individual Therapy  Ozan Maclay L 12/13/2017, 7:21 AM

## 2017-12-14 ENCOUNTER — Inpatient Hospital Stay (HOSPITAL_COMMUNITY): Payer: Federal, State, Local not specified - PPO | Admitting: Physical Therapy

## 2017-12-14 ENCOUNTER — Inpatient Hospital Stay (HOSPITAL_COMMUNITY): Payer: Federal, State, Local not specified - PPO | Admitting: Occupational Therapy

## 2017-12-14 DIAGNOSIS — E876 Hypokalemia: Secondary | ICD-10-CM

## 2017-12-14 DIAGNOSIS — R0989 Other specified symptoms and signs involving the circulatory and respiratory systems: Secondary | ICD-10-CM

## 2017-12-14 DIAGNOSIS — D72829 Elevated white blood cell count, unspecified: Secondary | ICD-10-CM

## 2017-12-14 LAB — CBC WITH DIFFERENTIAL/PLATELET
Abs Immature Granulocytes: 0 10*3/uL (ref 0.0–0.1)
Basophils Absolute: 0.1 10*3/uL (ref 0.0–0.1)
Basophils Relative: 1 %
Eosinophils Absolute: 0.4 10*3/uL (ref 0.0–0.7)
Eosinophils Relative: 3 %
HCT: 32.6 % — ABNORMAL LOW (ref 36.0–46.0)
Hemoglobin: 10.7 g/dL — ABNORMAL LOW (ref 12.0–15.0)
Immature Granulocytes: 0 %
Lymphocytes Relative: 13 %
Lymphs Abs: 1.4 10*3/uL (ref 0.7–4.0)
MCH: 28.2 pg (ref 26.0–34.0)
MCHC: 32.8 g/dL (ref 30.0–36.0)
MCV: 86 fL (ref 78.0–100.0)
Monocytes Absolute: 1.1 10*3/uL — ABNORMAL HIGH (ref 0.1–1.0)
Monocytes Relative: 10 %
Neutro Abs: 7.8 10*3/uL — ABNORMAL HIGH (ref 1.7–7.7)
Neutrophils Relative %: 73 %
Platelets: 394 10*3/uL (ref 150–400)
RBC: 3.79 MIL/uL — ABNORMAL LOW (ref 3.87–5.11)
RDW: 15.5 % (ref 11.5–15.5)
WBC: 10.6 10*3/uL — ABNORMAL HIGH (ref 4.0–10.5)

## 2017-12-14 LAB — GLUCOSE, CAPILLARY
Glucose-Capillary: 105 mg/dL — ABNORMAL HIGH (ref 70–99)
Glucose-Capillary: 108 mg/dL — ABNORMAL HIGH (ref 70–99)
Glucose-Capillary: 107 mg/dL — ABNORMAL HIGH (ref 70–99)
Glucose-Capillary: 113 mg/dL — ABNORMAL HIGH (ref 70–99)

## 2017-12-14 LAB — BASIC METABOLIC PANEL
Anion gap: 10 (ref 5–15)
BUN: 14 mg/dL (ref 8–23)
CO2: 23 mmol/L (ref 22–32)
Calcium: 8.6 mg/dL — ABNORMAL LOW (ref 8.9–10.3)
Chloride: 98 mmol/L (ref 98–111)
Creatinine, Ser: 0.63 mg/dL (ref 0.44–1.00)
GFR calc Af Amer: >60 mL/min (ref >60)
GFR calc non Af Amer: >60 mL/min (ref >60)
Glucose, Bld: 109 mg/dL — ABNORMAL HIGH (ref 70–99)
Potassium: 3 mmol/L — ABNORMAL LOW (ref 3.5–5.1)
Sodium: 131 mmol/L — ABNORMAL LOW (ref 135–145)

## 2017-12-14 MED ORDER — POTASSIUM CHLORIDE CRYS ER 20 MEQ PO TBCR
30.0000 meq | EXTENDED_RELEASE_TABLET | Freq: Two times a day (BID) | ORAL | Status: AC
  Administered 2017-12-14 – 2017-12-16 (×6): 30 meq via ORAL
  Filled 2017-12-14 (×6): qty 1

## 2017-12-14 MED ORDER — SODIUM CHLORIDE 1 G PO TABS
1.0000 g | ORAL_TABLET | Freq: Two times a day (BID) | ORAL | Status: DC
  Administered 2017-12-14 – 2017-12-20 (×12): 1 g via ORAL
  Filled 2017-12-14 (×14): qty 1

## 2017-12-14 MED ORDER — FUROSEMIDE 40 MG PO TABS
40.0000 mg | ORAL_TABLET | Freq: Every day | ORAL | Status: DC
  Administered 2017-12-14 – 2017-12-15 (×2): 40 mg via ORAL
  Filled 2017-12-14 (×2): qty 1

## 2017-12-14 NOTE — Progress Notes (Signed)
Physical Therapy Session Note  Patient Details  Name: Regina [name] MRN: [id number] Date of Birth: Dec 04, 1940  Today's Date: 12/14/2017 PT Individual Time: 1300-1400 PT Individual Time Calculation (min): 60 min   Short Term Goals: Week 1:  PT Short Term Goal 1 (Week 1): Pt will perform bed mobility with S from flat bed PT Short Term Goal 2 (Week 1): Pt will perform transfers with S PT Short Term Goal 3 (Week 1): Pt will perform gait with LRAD x150' min guard PT Short Term Goal 4 (Week 1): Pt will ascend/descent 12 steps with B handrails and min guard  Skilled Therapeutic Interventions/Progress Updates:  Pt received sitting in recliner with hot pack on lower posterior neck and husband present. Pt reports feeling tired and sore, particularly in her lower cervical spine area and sore LEs, but is agreeable to PT services today. Pt performed CGA stand pivot transfer with RW to w/c from recliner. TotalA w/c transport to gym for energy/time conservation due to report of tiredness/soreness. Pt ambulated with HHA x2 for ~59ft with slightly improved gait speed and step length. CGA sit to stand x10 without UE support for LE strengthening. CGA step ups/downs on 3 inch step x10 bilaterally to mimic small threshold step in home environment. CGA standing balance on airex without UE support with both wide and narrow BOS, tolerate 3-4 min at a time before needing seated rest. First trial of each BOS stance performed statically and second trial with unilateral and bilateral UE elevation to progress balance activity. Pt able to WS to self correct with minor LOBs and reports feeling "extra wobbly" today with increased sway compared to previous performance. CGA ambulation with RW for ~170ft to return to room. Min verbal cues required for foot clearance and proper/upright posture. Hot pack replaced on lower neck to decrease soreness. Pt remained in recliner at end of session with all needs in reach.    Therapy Documentation Precautions:  Precautions Precautions: Cervical, Fall Precaution Comments: Per MD orders: pt can take off brace for showering and walking to the bathroom  Required Braces or Orthoses: Cervical Brace Cervical Brace: Hard collar Restrictions Weight Bearing Restrictions: No General:   Vital Signs: Therapy Vitals Temp: 98 F (36.7 C) Temp Source: Oral Pulse Rate: 70 Resp: 20 BP: (!) 87/32 Patient Position (if appropriate): Sitting Oxygen Therapy SpO2: 99 % O2 Device: Room Air Pain: Pain Assessment Pain Scale: 0-10 Pain Score: 7  Pain Type: Acute pain Pain Location: Neck Pain Orientation: Left Pain Radiating Towards: shoulder Pain Descriptors / Indicators: Aching Pain Frequency: Constant Pain Onset: On-going Pain Intervention(s): Medication (See eMAR);Heat applied;Repositioned Other Treatments:     See Function Navigator for Current Functional Status.   Therapy/Group: Individual Therapy  [place] Krystel Fletchall 12/14/2017, 2:58 PM

## 2017-12-14 NOTE — Progress Notes (Addendum)
[facility] PHYSICAL MEDICINE & REHABILITATION     PROGRESS NOTE  Subjective/Complaints:  Patient seen sitting up in bed this morning. She states she slept well overnight.She has questions about salt tabs. Informed by nursing regarding neck wound, evaluated together.  ROS: Denies CP, SOB, nausea, vomiting, diarrhea.  Objective: Vital Signs: Blood pressure (!) 160/82, pulse 71, temperature 97.9 F (36.6 C), temperature source Oral, resp. rate 16, SpO2 98 %. No results found. Recent Labs    12/14/17 0700  WBC 10.6*  HGB 10.7*  HCT 32.6*  PLT 394   Recent Labs    12/14/17 0700  NA 131*  K 3.0*  CL 98  GLUCOSE 109*  BUN 14  CREATININE 0.63  CALCIUM 8.6*   CBG (last 3)  Recent Labs    12/13/17 2109 12/14/17 0709 12/14/17 1223  GLUCAP 123* 105* 108*    Wt Readings from Last 3 Encounters:  12/01/17 72.8 kg  11/29/17 71.9 kg  02/17/13 81.2 kg    Physical Exam:  BP (!) 160/82 (BP Location: Right Arm)   Pulse 71   Temp 97.9 F (36.6 C) (Oral)   Resp 16   SpO2 98%  Constitutional: She appears well-developed and well-nourished.  HENT: Normocephalic and atraumatic.  Eyes: EOM are normal. No discharge. Neck: Cervical collar in place  Cardiovascular: RRR. No  JVD.  Respiratory: Effort normal and breath sounds normal.  GI: Bowel sounds are normal. She exhibits no distension.  Musculoskeletal: LE edema, stable  Neurological: She is alert and oriented.  Follows full commands Motor: Bilateral upper extremities: Shoulder flexion, elbow flexion/extension, wrist extension 4/5, hand grip 3+/5 Bilateral lower extremities: 4+/5 proximal distal, unchanged Skin: neck incision with edema and induration Psychiatric: She has a normal mood and affect. Her behavior is normal.   Assessment/Plan: 1. Functional deficits secondary to cervical stenosis/spondylosis/myelopathy with paraparesis status post decompression which require 3+ hours per day of interdisciplinary therapy in a  comprehensive inpatient rehab setting. Physiatrist is providing close team supervision and 24 hour management of active medical problems listed below. Physiatrist and rehab team continue to assess barriers to discharge/monitor patient progress toward functional and medical goals.  Function:  Bathing Bathing position Bathing activity did not occur: Refused Position: Production manager parts bathed by patient: Right arm, Left arm, Chest, Abdomen, Front perineal area, Right upper leg, Left lower leg, Right lower leg, Left upper leg Body parts bathed by helper: Back, Buttocks  Bathing assist Assist Level: Touching or steadying assistance(Pt > 75%)      Upper Body Dressing/Undressing Upper body dressing   What is the patient wearing?: Pull over shirt/dress Bra - Perfomed by patient: Thread/unthread right bra strap, Thread/unthread left bra strap, Hook/unhook bra (pull down sports bra)   Pull over shirt/dress - Perfomed by patient: Thread/unthread right sleeve, Thread/unthread left sleeve, Put head through opening, Pull shirt over trunk Pull over shirt/dress - Perfomed by helper: Put head through opening        Upper body assist Assist Level: Supervision or verbal cues      Lower Body Dressing/Undressing Lower body dressing   What is the patient wearing?: Underwear, Pants, Shoes Underwear - Performed by patient: Thread/unthread right underwear leg, Thread/unthread left underwear leg, Pull underwear up/down   Pants- Performed by patient: Thread/unthread right pants leg, Thread/unthread left pants leg, Pull pants up/down   Non-skid slipper socks- Performed by patient: Don/doff right sock, Don/doff left sock       Shoes - Performed by patient:  Don/doff right shoe, Don/doff left shoe            Lower body assist Assist for lower body dressing: Touching or steadying assistance (Pt > 75%)      Toileting Toileting   Toileting steps completed by patient: Adjust clothing  prior to toileting, Performs perineal hygiene, Adjust clothing after toileting Toileting steps completed by helper: Performs perineal hygiene Toileting Assistive Devices: Grab bar or rail  Toileting assist Assist level: Touching or steadying assistance (Pt.75%)   Transfers Chair/bed transfer   Chair/bed transfer method: Stand pivot Chair/bed transfer assist level: Touching or steadying assistance (Pt > 75%)(per Linden Dolin, NT report) Chair/bed transfer assistive device: Armrests, Medical sales representative     Max distance: 117ft Assist level: Touching or steadying assistance (Pt > 75%)   Wheelchair Wheelchair activity did not occur: N/A        Cognition Comprehension Comprehension assist level: Understands complex 90% of the time/cues 10% of the time  Expression Expression assist level: Expresses complex ideas: With no assist  Social Interaction Social Interaction assist level: Interacts appropriately with others - No medications needed.  Problem Solving Problem solving assist level: Solves complex 90% of the time/cues < 10% of the time, Solves complex problems: With extra time  Memory Memory assist level: Recognizes or recalls 90% of the time/requires cueing < 10% of the time    Medical Problem List and Plan: 1.  Decreased functional mobility secondary to cervical stenosis/spondylosis/myelopathy.  Status post discectomy C3-4, 4-5 and 5-6 for decompression of spinal cord and existing nerve roots 12/06/2017.  Cervical collar  Continue CIR  Will speak to neurosurgery regarding neck incision induration 2.  DVT Prophylaxis/Anticoagulation: SCDs  Vascular study negative for DVT  3. Pain Management: Skelaxin and oxycodone as needed 4. Mood: Lexapro 10 mg nightly, Xanax 0.25 mg 3 times daily as needed 5. Neuropsych: This patient is capable of making decisions on her own behalf. 6. Skin/Wound Care: Routine skin checks 7. Fluids/Electrolytes/Nutrition: Routine in and outs   8. Hyponatremia: HCTZ held.    Sodium 131 on 9/13  Labs ordered for Monday  Continue sodium chloride tablets, decrease to 1 mg twice a day  Continue to monitor 9.  Bouts of syncope.  Monitor blood pressure when out of bed.  Echocardiogram with ejection fraction of 65%. 10.  Hypertension.  Hydralazine 50 mg every 8 hours  Labile, likely due to fluid retention  Lasix 40 mg daily started on 9/13  Monitor with increased mobility 11.  History of diabetes mellitus.   Patient on Glucophage 850 mg twice daily prior to admission. Check CBG ac/HS. Resume as needed  Relatively controlled on 9/13  Monitor with increased mobility 12.  Hypothyroidism.  Synthroid 13.  Asthma/COPD.  Continue nebulizers and Singulair 14.  Constipation.  Laxative assistance 15. Hypoalbuminemia  Supplement initiated on 9/10 16. Acute blood loss anemia  Hemoglobin 10.7 on 9/13  Continue to monitor 17. Hypokalemia  K+ 3.0 on 9/13  Supplemented x3 days 18. Leukocytosis  WBCs 10.6 on 9/13  Afebrile  Continue to monitor   LOS (Days) 4 A FACE TO FACE EVALUATION WAS PERFORMED  Ankit Lorie Phenix 12/14/2017 12:39 PM

## 2017-12-14 NOTE — Progress Notes (Signed)
Occupational Therapy Session Note  Patient Details  Name: Regina [name] MRN: [id number] Date of Birth: 26-Jun-1940  Today's Date: 12/14/2017 OT Individual Time: 1100-1200 OT Individual Time Calculation (min): 60 min     Skilled Therapeutic Interventions/Progress Updates:    1:1 Pt ambulated ~30 feet with RW with min A with more than reasonable amt of time from her room down the hallway. Transitioned into the gym and focused on static standing balance on compliant surface with contact guard while perform [facility] task with yellow theraputty. Pt retreive small nuts from the putty and focus on manipulation of putty for strengthing of hands. Also performed 15 sit to stands without UE support on complaint surface and then 8 on regular floor without UB support.  Pt performed Zoom ball activity with focus on upright trunk position and  Scapular strengthening- however unable to tolerate for very long. Pt perform stand pivot transfers without AD with min A focusing on weight shifts and balance. LEft sitting up in the recliner with call bell in place.  Therapy Documentation Precautions:  Precautions Precautions: Cervical, Fall Precaution Comments: Per MD orders: pt can take off brace for showering and walking to the bathroom  Required Braces or Orthoses: Cervical Brace Cervical Brace: Hard collar Restrictions Weight Bearing Restrictions: No Pain: Pain Assessment Pain Scale: 0-10 Pain Score: 7  Pain Type: Acute pain Pain Location: Neck Pain Orientation: Left Pain Radiating Towards: shoulder Pain Descriptors / Indicators: Aching Pain Frequency: Constant Pain Onset: On-going Pain Intervention(s): Medication (See eMAR);Heat applied;Repositioned- applied kinesotape  See Function Navigator for Current Functional Status.   Therapy/Group: Individual Therapy  Willeen Cass [facility] 12/14/2017, 2:33 PM

## 2017-12-14 NOTE — Progress Notes (Signed)
Occupational Therapy Session Note  Patient Details  Name: Regina [name] MRN: [id number] Date of Birth: 05-Feb-1941  Today's Date: 12/14/2017 OT Individual Time:  0845-1000 (75 mins)      Short Term Goals: Week 1:  OT Short Term Goal 1 (Week 1): Pt will perform sit to stand transfers with supervision for self care tasks  OT Short Term Goal 2 (Week 1): Pt will perform UB dressing with supervision assist  OT Short Term Goal 3 (Week 1): Pt will perform LB dressing with supervision assist   OT Short Term Goal 4 (Week 1): Pt will retrieve dressing items needed for dressing with LRAD and close supervision   Skilled Therapeutic Interventions/Progress Updates:    Pt supine in bed upon entry with reports of increased fatigue from yesterday's session however no reports of pain. Pt performed all functional transfers this session with RW and varying close supervision-min A dependent on fatigue. Pt ambulated to closet and retrieved clothing items needed for dressing tasks with good use of RW for UE support and body positioning when bending forward. Pt performed dressing tasks while seated and was able to pull LB dressing up with pt reports of "I can feel the waistband better today."  Pt performed grooming tasks and washing of cervical replacement pads standing and seated at sink with increased amount of seated rest breaks needed due to noted R knee buckling with pt able to self correct upright standing position with pt reports of fatigue. Pt educated on daily schedule of replacing cervical brace pads and washing pads with verbal comprehension, pt reports her husband will be able to replace them for her. Pt ambulated to toilet and performed toileting tasks while seated however required assistance from therapist for cleaning buttocks due to decreased UE ROM. Pt returned to w/c and transported to therapy gym total A for time management.   Pt transferred EOM for unsupported sitting during table top task  with placement of clothes pins of increased resistance throughout activity using gross grasp and pincer grasp with good tolerance. Distributed yellow theraputty and printed HEP to pt with therapist demonstrating HEP exercises and pt able to return demonstrate with fair carryover requiring mod VC's for hand positioning and technique. Pt transferred back to w/c and transported back to room same method as mentioned above. Pt transferred to recliner and left with all needs in reach and chair alarm set.   Therapy Documentation Precautions:  Precautions Precautions: Cervical, Fall Precaution Comments: Per MD orders: pt can take off brace for showering and walking to the bathroom  Required Braces or Orthoses: Cervical Brace Cervical Brace: Hard collar Restrictions Weight Bearing Restrictions: No  See Function Navigator for Current Functional Status.   Therapy/Group: Individual Therapy  Nadiah Corbit 12/14/2017, 10:09 AM

## 2017-12-15 ENCOUNTER — Inpatient Hospital Stay (HOSPITAL_COMMUNITY): Payer: Federal, State, Local not specified - PPO

## 2017-12-15 ENCOUNTER — Inpatient Hospital Stay (HOSPITAL_COMMUNITY): Payer: Federal, State, Local not specified - PPO | Admitting: Physical Therapy

## 2017-12-15 LAB — GLUCOSE, CAPILLARY
Glucose-Capillary: 105 mg/dL — ABNORMAL HIGH (ref 70–99)
Glucose-Capillary: 102 mg/dL — ABNORMAL HIGH (ref 70–99)
Glucose-Capillary: 84 mg/dL (ref 70–99)
Glucose-Capillary: 117 mg/dL — ABNORMAL HIGH (ref 70–99)

## 2017-12-15 MED ORDER — FUROSEMIDE 40 MG PO TABS
40.0000 mg | ORAL_TABLET | Freq: Every day | ORAL | Status: AC
  Administered 2017-12-16: 40 mg via ORAL
  Filled 2017-12-15: qty 1

## 2017-12-15 NOTE — Progress Notes (Signed)
[place] PHYSICAL MEDICINE & REHABILITATION     PROGRESS NOTE  Subjective/Complaints:  Patient seen sitting up in bed this morning. She states she slept well overnight. She notes a decrease in edema. She states she was told she could take of her c-collar to "let her neck breathe".  ROS: denies CP, SOB, nausea, vomiting, diarrhea.  Objective: Vital Signs: Blood pressure (!) 150/73, pulse 74, temperature 97.7 F (36.5 C), temperature source Oral, resp. rate 17, SpO2 100 %. No results found. Recent Labs    12/14/17 0700  WBC 10.6*  HGB 10.7*  HCT 32.6*  PLT 394   Recent Labs    12/14/17 0700  NA 131*  K 3.0*  CL 98  GLUCOSE 109*  BUN 14  CREATININE 0.63  CALCIUM 8.6*   CBG (last 3)  Recent Labs    12/14/17 1648 12/14/17 2130 12/15/17 0706  GLUCAP 107* 113* 105*    Wt Readings from Last 3 Encounters:  12/01/17 72.8 kg  11/29/17 71.9 kg  02/17/13 81.2 kg    Physical Exam:  BP (!) 150/73 (BP Location: Right Arm)   Pulse 74   Temp 97.7 F (36.5 C) (Oral)   Resp 17   SpO2 100%  Constitutional: She appears well-developed and well-nourished.  HENT: Normocephalic and atraumatic.  Eyes: EOM are normal. No discharge. Neck: no c-collar this a.m.  Cardiovascular: RRR. No JVD.  Respiratory: Effort normal and breath sounds normal.  GI: Bowel sounds are normal. She exhibits no distension.  Musculoskeletal: LE edema, improving  Neurological: She is alert and oriented.  Follows full commands Motor: Bilateral upper extremities: Shoulder flexion, elbow flexion/extension, wrist extension 4/5, hand grip 3+/5 Bilateral lower extremities: 4+/5 proximal distal, unchanged Skin: neck incision with edema and induration, stable Psychiatric: She has a normal mood and affect. Her behavior is normal.   Assessment/Plan: 1. 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 Physiatrist is providing close team supervision and 24 hour management of active medical problems listed below. Physiatrist and rehab team continue to assess barriers to discharge/monitor patient progress toward functional and medical goals.  Function:  Bathing Bathing position Bathing activity did not occur: Refused Position: Production manager parts bathed by patient: Right arm, Left arm, Chest, Abdomen, Front perineal area, Right upper leg, Left lower leg, Right lower leg, Left upper leg Body parts bathed by helper: Back, Buttocks  Bathing assist Assist Level: Touching or steadying assistance(Pt > 75%)      Upper Body Dressing/Undressing Upper body dressing   What is the patient wearing?: Pull over shirt/dress Bra - Perfomed by patient: Thread/unthread right bra strap, Thread/unthread left bra strap, Hook/unhook bra (pull down sports bra)   Pull over shirt/dress - Perfomed by patient: Thread/unthread right sleeve, Thread/unthread left sleeve, Put head through opening, Pull shirt over trunk Pull over shirt/dress - Perfomed by helper: Put head through opening        Upper body assist Assist Level: Supervision or verbal cues      Lower Body Dressing/Undressing Lower body dressing   What is the patient wearing?: Underwear, Pants, Shoes Underwear - Performed by patient: Thread/unthread right underwear leg, Thread/unthread left underwear leg, Pull underwear up/down   Pants- Performed by patient: Thread/unthread right pants leg, Thread/unthread left pants leg, Pull pants up/down   Non-skid slipper socks- Performed by patient: Don/doff right sock, Don/doff left sock  Shoes - Performed by patient: Don/doff right shoe, Don/doff left shoe            Lower body assist Assist for lower body dressing: Touching or steadying assistance (Pt > 75%)      Toileting Toileting   Toileting steps completed  by patient: Adjust clothing prior to toileting, Performs perineal hygiene, Adjust clothing after toileting Toileting steps completed by helper: Performs perineal hygiene Toileting Assistive Devices: Grab bar or rail  Toileting assist Assist level: Touching or steadying assistance (Pt.75%)   Transfers Chair/bed transfer   Chair/bed transfer method: Stand pivot Chair/bed transfer assist level: Touching or steadying assistance (Pt > 75%) Chair/bed transfer assistive device: Armrests, Medical sales representative     Max distance: 196ft Assist level: Touching or steadying assistance (Pt > 75%)   Wheelchair Wheelchair activity did not occur: N/A        Cognition Comprehension Comprehension assist level: Understands complex 90% of the time/cues 10% of the time  Expression Expression assist level: Expresses complex ideas: With no assist  Social Interaction Social Interaction assist level: Interacts appropriately with others - No medications needed.  Problem Solving Problem solving assist level: Solves complex 90% of the time/cues < 10% of the time, Solves complex problems: With extra time  Memory Memory assist level: Recognizes or recalls 90% of the time/requires cueing < 10% of the time    Medical Problem List and Plan: 1.  Decreased functional mobility secondary to cervical stenosis/spondylosis/myelopathy.  Status post discectomy C3-4, 4-5 and 5-6 for decompression of spinal cord and existing nerve roots 12/06/2017.  Cervical collar  Continue CIR  Will speak to neurosurgery regarding neck incision induration, await further recs 2.  DVT Prophylaxis/Anticoagulation: SCDs  Vascular study negative for DVT  3. Pain Management: Skelaxin and oxycodone as needed 4. Mood: Lexapro 10 mg nightly, Xanax 0.25 mg 3 times daily as needed 5. Neuropsych: This patient is capable of making decisions on her own behalf. 6. Skin/Wound Care: Routine skin checks 7. Fluids/Electrolytes/Nutrition: Routine  in and outs  8. Hyponatremia: HCTZ held.    Sodium 131 on 9/13  Labs ordered for Monday  Continue sodium chloride tablets, decrease to 1 mg twice a day  Continue to monitor 9.  Bouts of syncope.  Monitor blood pressure when out of bed.  Echocardiogram with ejection fraction of 65%. 10.  Hypertension.  Hydralazine 50 mg every 8 hours  Labile and 9/14  Lasix 40 mg daily 9/13-9/15  Monitor with increased mobility 11.  History of diabetes mellitus.   Patient on Glucophage 850 mg twice daily prior to admission. Check CBG ac/HS. Resume as needed  Relatively controlled on 9/14  Monitor with increased mobility 12.  Hypothyroidism.  Synthroid 13.  Asthma/COPD.  Continue nebulizers and Singulair 14.  Constipation.  Laxative assistance 15. Hypoalbuminemia  Supplement initiated on 9/10 16. Acute blood loss anemia  Hemoglobin 10.7 on 9/13  Continue to monitor 17. Hypokalemia  K+ 3.0 on 9/13  Supplemented x3 days 18. Leukocytosis  WBCs 10.6 on 9/13  Afebrile  Continue to monitor   LOS (Days) 5 A FACE TO FACE EVALUATION WAS PERFORMED  Evalisse Prajapati Lorie Phenix 12/15/2017 8:33 AM

## 2017-12-15 NOTE — Progress Notes (Signed)
Physical Therapy Session Note  Patient Details  Name: Regina [name] MRN: [id number] Date of Birth: [birth year]  Today's Date: 12/15/2017 PT Individual Time: 3128-1188 PT Individual Time Calculation (min): 60 min   Short Term Goals: Week 1:  PT Short Term Goal 1 (Week 1): Pt will perform bed mobility with S from flat bed PT Short Term Goal 2 (Week 1): Pt will perform transfers with S PT Short Term Goal 3 (Week 1): Pt will perform gait with LRAD x150' min guard PT Short Term Goal 4 (Week 1): Pt will ascend/descent 12 steps with B handrails and min guard  Skilled Therapeutic Interventions/Progress Updates:   Pt received sitting in WC and agreeable to PT. WC mobility through hall x 174f with supervision assist from PT. Stand pivot transfers completed with supervision assist and RW throughout Treatment. Nustep reciprocal movement training x 8 min cues min cues for full ROM BLE. Gait training with RW x 1830fand supervision-CGA from PT with min cues for AD management in turns. Dynamic balance training on Wii fit board to improve use of ankle strategy with weight shifting. Penguin slide x 2. 38 and 43, table tilt x 2 level 3 and level 4. [facility] returned to [facility] and [facility] and all needs met.        Therapy Documentation Precautions:  Precautions Precautions: Cervical, Fall Precaution Comments: Per MD orders: pt can take off brace for showering and walking to the bathroom  Required Braces or Orthoses: Cervical Brace Cervical Brace: Hard collar Restrictions Weight Bearing Restrictions: No Vital Signs: Therapy Vitals Temp: 98.8 F (37.1 C) Temp Source: Oral Pulse Rate: 79 Resp: 18 BP: (!) 147/55 Patient Position (if appropriate): Standing Oxygen Therapy SpO2: 100 % O2 Device: Room Air Pain: Pain Assessment Pain Scale: 0-10 Pain Score: 7  Pain Type: Surgical pain Pain Location: Neck Pain Orientation: Right;Left Pain Descriptors /  Indicators: Aching;Stabbing Pain Onset: Gradual Pain Intervention(s): Medication (See eMAR)    Therapy/Group: Individual Therapy  AuLorie Phenix/14/2019, 5:04 PM

## 2017-12-15 NOTE — Progress Notes (Signed)
Occupational Therapy Session Note  Patient Details  Name: Regina [name] MRN: [id number] Date of Birth: 09-09-1940  Today's Date: 12/15/2017 OT Individual Time: 0254-2706 Session 2: 1400-1500 OT Individual Time Calculation (min): 75 min Session 2: 60 min   Short Term Goals: Week 1:  OT Short Term Goal 1 (Week 1): Pt will perform sit to stand transfers with supervision for self care tasks  OT Short Term Goal 2 (Week 1): Pt will perform UB dressing with supervision assist  OT Short Term Goal 3 (Week 1): Pt will perform LB dressing with supervision assist   OT Short Term Goal 4 (Week 1): Pt will retrieve dressing items needed for dressing with LRAD and close supervision   Skilled Therapeutic Interventions/Progress Updates:    Pt received supine in bed with no c/o pain agreeable to therapy. Pt completed functional mobility to bathroom with min A using RW. Increased time required for problem solving through RW management. Pt completed 3/3 toileting tasks with min A overall, esp for posterior peri hygiene. Edu provided re compensating to wipe. Pt donned shirt and new pants sitting EOB with (S). Pt able to set up oral care task with set up and increased time in standing. Pt was transported to therapy gym for time management. Pt completed pipe tree with intermittent vc for in hand manipulation with pieces. Pt completed 25 ft of w/c propulsion with vc and manual facilitation for UE placement. Pt was left sitting up in recliner with all needs met and chair alarm set.   Session 2: Pt received sitting up in recliner with family present. Pt was transported into kitchen area and completed functional reaching bimanually, with vc required to use R UE, and occasionally min manual facilitation to reach into cabinets. Pt completed functional mobility around kitchen with min cueing for RW management. Per discussion with pt and daughter in law, feel TTB in bench may be safer option. Pt provided with demo re  TTB use and pt returned demo with min A. Pt then completed standing level jenga game with min A for R UE stabilization. Pt was returned to room and left sitting up in recliner with family present.   Therapy Documentation Precautions:  Precautions Precautions: Cervical, Fall Precaution Comments: Per MD orders: pt can take off brace for showering and walking to the bathroom  Required Braces or Orthoses: Cervical Brace Cervical Brace: Hard collar Restrictions Weight Bearing Restrictions: No  Pain: Pain Assessment Pain Scale: 0-10 Pain Score: 0-No pain Pain Type: Surgical pain Pain Location: Neck Pain Descriptors / Indicators: Aching;Sore Pain Onset: On-going Pain Intervention(s): Medication (See eMAR)  See Function Navigator for Current Functional Status.   Therapy/Group: Individual Therapy  Curtis Sites 12/15/2017, 11:10 AM

## 2017-12-16 ENCOUNTER — Inpatient Hospital Stay (HOSPITAL_COMMUNITY): Payer: Federal, State, Local not specified - PPO | Admitting: Occupational Therapy

## 2017-12-16 LAB — GLUCOSE, CAPILLARY
Glucose-Capillary: 103 mg/dL — ABNORMAL HIGH (ref 70–99)
Glucose-Capillary: 90 mg/dL (ref 70–99)
Glucose-Capillary: 97 mg/dL (ref 70–99)
Glucose-Capillary: 112 mg/dL — ABNORMAL HIGH (ref 70–99)

## 2017-12-16 NOTE — Plan of Care (Signed)
  Problem: Consults Goal: RH GENERAL PATIENT EDUCATION Description See Patient Education module for education specifics. Outcome: Progressing Goal: Skin Care Protocol Initiated - if Braden Score 18 or less Description If consults are not indicated, leave blank or document N/A Outcome: Progressing Goal: Nutrition Consult-if indicated Outcome: Progressing Goal: Diabetes Guidelines if Diabetic/Glucose > 140 Description If diabetic or lab glucose is > 140 mg/dl - Initiate Diabetes/Hyperglycemia Guidelines & Document Interventions  Outcome: Progressing   Problem: RH BOWEL ELIMINATION Goal: RH STG MANAGE BOWEL WITH ASSISTANCE Description STG Manage Bowel with Mod I Assistance.  Outcome: Progressing Goal: RH STG MANAGE BOWEL W/MEDICATION W/ASSISTANCE Description STG Manage Bowel with Medication with mod I Assistance.  Outcome: Progressing   Problem: RH BLADDER ELIMINATION Goal: RH STG MANAGE BLADDER WITH ASSISTANCE Description STG Manage Bladder With mod I  Assistance  Outcome: Progressing Goal: RH STG MANAGE BLADDER WITH MEDICATION WITH ASSISTANCE Description STG Manage Bladder With Medication With mod I Assistance.  Outcome: Progressing Goal: RH STG MANAGE BLADDER WITH EQUIPMENT WITH ASSISTANCE Description STG Manage Bladder With Equipment With mod I Assistance  Outcome: Progressing   Problem: RH SKIN INTEGRITY Goal: RH STG SKIN FREE OF INFECTION/BREAKDOWN Description Mod I A  Outcome: Progressing Goal: RH STG MAINTAIN SKIN INTEGRITY WITH ASSISTANCE Description STG Maintain Skin Integrity With mod I Assistance.  Outcome: Progressing Goal: RH STG ABLE TO PERFORM INCISION/WOUND CARE W/ASSISTANCE Description STG Able To Perform Incision/Wound Care With mod I Assistance.  Outcome: Progressing   Problem: RH SAFETY Goal: RH STG ADHERE TO SAFETY PRECAUTIONS W/ASSISTANCE/DEVICE Description STG Adhere to Safety Precautions With mod I Assistance/Device.  Outcome:  Progressing Goal: RH STG DECREASED RISK OF FALL WITH ASSISTANCE Description STG Decreased Risk of Fall With mod I  Assistance.  Outcome: Progressing   Problem: RH PAIN MANAGEMENT Goal: RH STG PAIN MANAGED AT OR BELOW PT'S PAIN GOAL Description Pain < 2 with mod I Assist  Outcome: Progressing   Problem: RH KNOWLEDGE DEFICIT GENERAL Goal: RH STG INCREASE KNOWLEDGE OF SELF CARE AFTER HOSPITALIZATION Description Mod I assist  Outcome: Progressing   Problem: RH Vision Goal: RH LTG Vision (Specify) Outcome: Progressing   Problem: RH Pre-functional (Specify) Goal: RH LTG Pre-functional (Specify) Outcome: Progressing

## 2017-12-16 NOTE — Progress Notes (Signed)
Occupational Therapy Session Note  Patient Details  Name: Regina [name] MRN: [id number] Date of Birth: [birth year]  Today's Date: 12/16/2017 OT Individual Time: 1305-1405 OT Individual Time Calculation (min): 60 min    Short Term Goals: Week 1:  OT Short Term Goal 1 (Week 1): Pt will perform sit to stand transfers with supervision for self care tasks  OT Short Term Goal 2 (Week 1): Pt will perform UB dressing with supervision assist  OT Short Term Goal 3 (Week 1): Pt will perform LB dressing with supervision assist   OT Short Term Goal 4 (Week 1): Pt will retrieve dressing items needed for dressing with LRAD and close supervision   Skilled Therapeutic Interventions/Progress Updates:    Treatment session with focus on dynamic standing balance and BUE functional use.  Pt received upright in recliner with family present.  Pt completed stand pivot transfer to w/c with min guard.  Engaged in [place] fine and gross motor tasks in sitting progressing to standing to pain tolerance.  RN notified of pain and received pain meds during session.  Pt opened pill bottles with increased time and sorted "pills" into pill box with focus on fine and gross motor movements while focusing on various methods to increase success.  Therapist recommended pt pour pills on to colored wash cloth (visual contrast) as pt with increased success when picking up pills from table top vs out of bottle.  Engaged in pinch strengthening with resistive clothespins in standing, incorporating reaching across midline and modifying grasp to address tip to tip, 3 jaw chuck, and lateral pinch.  Pt continues to report Lt > Rt UE with fine and gross motor movements.  Discussed bathroom setup, daughter in law present and showed therapist pictures of pt walk-in shower.  Therapist demonstrated side stepping and stepping backwards over 8-9" threshold (per daughter in law) into shower while discussing various shower scenarios.  Pt will require  additional practice with walk-in shower to decide which shower to utilize once d/c home.  Therapy Documentation Precautions:  Precautions Precautions: Cervical, Fall Precaution Comments: Per MD orders: pt can take off brace for showering and walking to the bathroom  Required Braces or Orthoses: Cervical Brace Cervical Brace: Hard collar Restrictions Weight Bearing Restrictions: No General:   Vital Signs: Therapy Vitals Temp: 98.6 F (37 C) Temp Source: Oral Pulse Rate: 72 Resp: 18 BP: (!) 135/51 Patient Position (if appropriate): Sitting Oxygen Therapy SpO2: 100 % O2 Device: Room Air Pain: Pain Assessment Pain Scale: 0-10 Pain Score: 6  Pain Type: Acute pain Pain Location: Shoulder Pain Orientation: Right;Left Pain Descriptors / Indicators: Aching Pain Onset: On-going Pain Intervention(s): Medication (See eMAR)  See Function Navigator for Current Functional Status.   Therapy/Group: Individual Therapy  Simonne Come 12/16/2017, 3:19 PM

## 2017-12-16 NOTE — Progress Notes (Signed)
[facility]IC[facility] & RE[facility]ABILITATION     PROGRESS NOTE  Subjective/Complaints:  Patient seen sitting up in bed this morning. She states she slept well overnight. She notes she had some back pain from therapies yesterday.  ROS: denies CP, SOB, nausea, vomiting, diarrhea.  Objective: Vital Signs: Blood pressure (!) 160/50, pulse 73, temperature 98.1 F (36.7 C), temperature source Oral, resp. rate 18, SpO2 99 %. No results found. Recent Labs    12/14/17 0700  WBC 10.6*  HGB 10.7*  HCT 32.6*  PLT 394   Recent Labs    12/14/17 0700  NA 131*  K 3.0*  CL 98  GLUCOSE 109*  BUN 14  CREATININE 0.63  CALCIUM 8.6*   CBG (last 3)  Recent Labs    12/15/17 1129 12/15/17 1703 12/15/17 2129  GLUCAP 102* 84 117*    Wt Readings from Last 3 Encounters:  12/01/17 72.8 kg  11/29/17 71.9 kg  02/17/13 81.2 kg    Physical Exam:  BP (!) 160/50 (BP Location: Left Arm)   Pulse 73   Temp 98.1 F (36.7 C) (Oral)   Resp 18   SpO2 99%  Constitutional: She appears well-developed and well-nourished.  HENT: Normocephalic and atraumatic.  Eyes: EOM are normal. No discharge. Neck: c-collar in place.  Cardiovascular: RRR. No JVD.  Respiratory: Effort normal and breath sounds normal.  GI: Bowel sounds are normal. She exhibits no distension.  Musculoskeletal: LE edema, improved Neurological: She is alert and oriented.  Follows full commands Motor: Bilateral upper extremities: Shoulder flexion, elbow flexion/extension, wrist extension 4/5, hand grip 3+/5, stable Bilateral lower extremities: 4+/5 proximal distal, unchanged Skin: neck incision with edema and induration, stable Psychiatric: She has a normal mood and affect. Her behavior is normal.   Assessment/Plan: 1. Functional deficits secondary to cervical stenosis/spondylosis/myelopathy with paraparesis status post decompression which require 3+ hours per day of interdisciplinary therapy in a comprehensive inpatient rehab  setting. Physiatrist is providing close team supervision and 24 hour management of active medical problems listed below. Physiatrist and rehab team continue to assess barriers to discharge/monitor patient progress toward functional and medical goals.  Function:  Bathing Bathing position Bathing activity did not occur: Refused Position: Production manager parts bathed by patient: Right arm, Left arm, Chest, Abdomen, Front perineal area, Right upper leg, Left lower leg, Right lower leg, Left upper leg Body parts bathed by helper: Back, Buttocks  Bathing assist Assist Level: Touching or steadying assistance(Pt > 75%)      Upper Body Dressing/Undressing Upper body dressing   What is the patient wearing?: Pull over shirt/dress Bra - Perfomed by patient: Thread/unthread right bra strap, Thread/unthread left bra strap, Hook/unhook bra (pull down sports bra)   Pull over shirt/dress - Perfomed by patient: Thread/unthread right sleeve, Thread/unthread left sleeve, Put head through opening, Pull shirt over trunk Pull over shirt/dress - Perfomed by helper: Put head through opening        Upper body assist Assist Level: Supervision or verbal cues      Lower Body Dressing/Undressing Lower body dressing   What is the patient wearing?: Underwear, Pants, Shoes Underwear - Performed by patient: Thread/unthread right underwear leg, Thread/unthread left underwear leg, Pull underwear up/down   Pants- Performed by patient: Thread/unthread right pants leg, Thread/unthread left pants leg, Pull pants up/down   Non-skid slipper socks- Performed by patient: Don/doff right sock, Don/doff left sock       Shoes - Performed by patient: Don/doff right shoe, Don/doff  left shoe            Lower body assist Assist for lower body dressing: Touching or steadying assistance (Pt > 75%)      Toileting Toileting   Toileting steps completed by patient: Adjust clothing prior to toileting Toileting  steps completed by helper: Performs perineal hygiene, Adjust clothing after toileting Toileting Assistive Devices: Grab bar or rail  Toileting assist Assist level: Touching or steadying assistance (Pt.75%)   Transfers Chair/bed transfer   Chair/bed transfer method: Stand pivot Chair/bed transfer assist level: Supervision or verbal cues Chair/bed transfer assistive device: Armrests, Medical sales representative     Max distance: 134ft Assist level: Touching or steadying assistance (Pt > 75%)   Wheelchair Wheelchair activity did not occur: N/A   Max wheelchair distance: 120 Assist Level: Supervision or verbal cues  Cognition Comprehension Comprehension assist level: Understands complex 90% of the time/cues 10% of the time  Expression Expression assist level: Expresses basic needs/ideas: With extra time/assistive device  Social Interaction Social Interaction assist level: Interacts appropriately with others with medication or extra time (anti-anxiety, antidepressant).  Problem Solving Problem solving assist level: Solves complex 90% of the time/cues < 10% of the time  Memory Memory assist level: Recognizes or recalls 90% of the time/requires cueing < 10% of the time    Medical Problem List and Plan: 1.  Decreased functional mobility secondary to cervical stenosis/spondylosis/myelopathy.  Status post discectomy C3-4, 4-5 and 5-6 for decompression of spinal cord and existing nerve roots 12/06/2017.  Cervical collar  Continue CIR  Will speak to neurosurgery regarding neck incision induration, cont await further recs 2.  DVT Prophylaxis/Anticoagulation: SCDs  Vascular study negative for DVT  3. Pain Management: Skelaxin and oxycodone as needed 4. Mood: Lexapro 10 mg nightly, Xanax 0.25 mg 3 times daily as needed 5. Neuropsych: This patient is capable of making decisions on her own behalf. 6. Skin/Wound Care: Routine skin checks 7. Fluids/Electrolytes/Nutrition: Routine in and outs   8. Hyponatremia: HCTZ held.    Sodium 131 on 9/13  Labs ordered for tomorrow  Continue sodium chloride tablets, decreased to 1 mg twice a day on 9/14  Continue to monitor 9.  Bouts of syncope.  Monitor blood pressure when out of bed.  Echocardiogram with ejection fraction of 65%. 10.  Hypertension.  Hydralazine 50 mg every 8 hours  Labile on 9/15  Lasix 40 mg daily 9/13-9/15  Monitor with increased mobility 11.  History of diabetes mellitus.   Patient on Glucophage 850 mg twice daily prior to admission. Check CBG ac/HS. Resume as needed  Relatively controlled on 9/15  Monitor with increased mobility 12.  Hypothyroidism.  Synthroid 13.  Asthma/COPD.  Continue nebulizers and Singulair 14.  Constipation.  Laxative assistance 15. Hypoalbuminemia  Supplement initiated on 9/10 16. Acute blood loss anemia  Hemoglobin 10.7 on 9/13  Continue to monitor 17. Hypokalemia  K+ 3.0 on 9/13  Supplemented x3 days  Labs ordered for tomorrow 18. Leukocytosis  WBCs 10.6 on 9/13  Afebrile  Continue to monitor   LOS (Days) 6 A FACE TO FACE EVALUATION WAS PERFORMED  Susana Duell Lorie Phenix 12/16/2017 6:45 AM

## 2017-12-17 ENCOUNTER — Inpatient Hospital Stay (HOSPITAL_COMMUNITY): Payer: Federal, State, Local not specified - PPO | Admitting: Physical Therapy

## 2017-12-17 ENCOUNTER — Inpatient Hospital Stay (HOSPITAL_COMMUNITY): Payer: Federal, State, Local not specified - PPO | Admitting: Occupational Therapy

## 2017-12-17 LAB — BASIC METABOLIC PANEL
Anion gap: 10 (ref 5–15)
BUN: 18 mg/dL (ref 8–23)
CO2: 26 mmol/L (ref 22–32)
Calcium: 9 mg/dL (ref 8.9–10.3)
Chloride: 99 mmol/L (ref 98–111)
Creatinine, Ser: 0.73 mg/dL (ref 0.44–1.00)
GFR calc Af Amer: >60 mL/min (ref >60)
GFR calc non Af Amer: >60 mL/min (ref >60)
Glucose, Bld: 106 mg/dL — ABNORMAL HIGH (ref 70–99)
Potassium: 4 mmol/L (ref 3.5–5.1)
Sodium: 135 mmol/L (ref 135–145)

## 2017-12-17 LAB — GLUCOSE, CAPILLARY
Glucose-Capillary: 97 mg/dL (ref 70–99)
Glucose-Capillary: 96 mg/dL (ref 70–99)
Glucose-Capillary: 99 mg/dL (ref 70–99)
Glucose-Capillary: 107 mg/dL — ABNORMAL HIGH (ref 70–99)

## 2017-12-17 NOTE — Progress Notes (Signed)
[facility] MEDIC[facility] & REHABILITATION     PROGRESS NOTE  Subjective/Complaints:  Patient seen working with therapies this AM.  Slept well overnight. She notes some discomfort with deep inspiration, likely, due to cervical collar, encouraged IS.  ROS: Denies CP, SOB, nausea, vomiting, diarrhea.  Objective: Vital Signs: Blood pressure (!) 160/80, pulse 72, temperature 98.7 F (37.1 C), temperature source Oral, resp. rate 18, SpO2 100 %. No results found. No results for input(s): WBC, HGB, HCT, PLT in the last 72 hours. No results for input(s): NA, K, CL, GLUCOSE, BUN, CREATININE, CALCIUM in the last 72 hours.  Invalid input(s): CO CBG (last 3)  Recent Labs    12/16/17 1647 12/16/17 2109 12/17/17 0624  GLUCAP 97 112* 97    Wt Readings from Last 3 Encounters:  12/01/17 72.8 kg  11/29/17 71.9 kg  02/17/13 81.2 kg    Physical Exam:  BP (!) 160/80 (BP Location: Right Arm)   Pulse 72   Temp 98.7 F (37.1 C) (Oral)   Resp 18   SpO2 100%  Constitutional: She appears well-developed and well-nourished.  HENT: Normocephalic and atraumatic.  Eyes: EOM are normal. No discharge. Neck: c-collar in place.  Cardiovascular: RRR. No JVD.  Respiratory: Effort normal and breath sounds normal.  GI: Bowel sounds are normal. She exhibits no distension.  Musculoskeletal: LE edema, improved Neurological: She is alert and oriented.  Follows full commands Motor: Bilateral upper extremities: Shoulder flexion, elbow flexion/extension, wrist extension 4/5, hand grip 3+/5, unchanged Bilateral lower extremities: 4+/5 proximal distal, unchanged Skin: neck incision with edema and induration, stable Psychiatric: She has a normal mood and affect. Her behavior is normal.   Assessment/Plan: 1. Functional deficits secondary to cervical stenosis/spondylosis/myelopathy with paraparesis status post decompression which require 3+ hours per day of interdisciplinary therapy in a comprehensive  inpatient rehab setting. Physiatrist is providing close team supervision and 24 hour management of active medical problems listed below. Physiatrist and rehab team continue to assess barriers to discharge/monitor patient progress toward functional and medical goals.  Function:  Bathing Bathing position Bathing activity did not occur: Refused Position: Production manager parts bathed by patient: Right arm, Left arm, Chest, Abdomen, Front perineal area, Right upper leg, Left lower leg, Right lower leg, Left upper leg Body parts bathed by helper: Back, Buttocks  Bathing assist Assist Level: Touching or steadying assistance(Pt > 75%)      Upper Body Dressing/Undressing Upper body dressing   What is the patient wearing?: Pull over shirt/dress Bra - Perfomed by patient: Thread/unthread right bra strap, Thread/unthread left bra strap, Hook/unhook bra (pull down sports bra)   Pull over shirt/dress - Perfomed by patient: Thread/unthread right sleeve, Thread/unthread left sleeve, Put head through opening, Pull shirt over trunk Pull over shirt/dress - Perfomed by helper: Put head through opening        Upper body assist Assist Level: Supervision or verbal cues      Lower Body Dressing/Undressing Lower body dressing   What is the patient wearing?: Underwear, Pants, Shoes Underwear - Performed by patient: Thread/unthread right underwear leg, Thread/unthread left underwear leg, Pull underwear up/down   Pants- Performed by patient: Thread/unthread right pants leg, Thread/unthread left pants leg, Pull pants up/down   Non-skid slipper socks- Performed by patient: Don/doff right sock, Don/doff left sock       Shoes - Performed by patient: Don/doff right shoe, Don/doff left shoe            Lower body assist Assist  for lower body dressing: Touching or steadying assistance (Pt > 75%)      Toileting Toileting   Toileting steps completed by patient: Adjust clothing prior to  toileting Toileting steps completed by helper: Performs perineal hygiene, Adjust clothing after toileting Toileting Assistive Devices: Grab bar or rail  Toileting assist Assist level: Touching or steadying assistance (Pt.75%)   Transfers Chair/bed transfer   Chair/bed transfer method: Stand pivot Chair/bed transfer assist level: Supervision or verbal cues Chair/bed transfer assistive device: Armrests, Medical sales representative     Max distance: 144ft Assist level: Touching or steadying assistance (Pt > 75%)   Wheelchair Wheelchair activity did not occur: N/A   Max wheelchair distance: 120 Assist Level: Supervision or verbal cues  Cognition Comprehension Comprehension assist level: Understands complex 90% of the time/cues 10% of the time  Expression Expression assist level: Expresses basic needs/ideas: With no assist  Social Interaction Social Interaction assist level: Interacts appropriately with others with medication or extra time (anti-anxiety, antidepressant).  Problem Solving Problem solving assist level: Solves complex 90% of the time/cues < 10% of the time  Memory Memory assist level: Recognizes or recalls 90% of the time/requires cueing < 10% of the time    Medical Problem List and Plan: 1.  Decreased functional mobility secondary to cervical stenosis/spondylosis/myelopathy.  Status post discectomy C3-4, 4-5 and 5-6 for decompression of spinal cord and existing nerve roots 12/06/2017.  Cervical collar  Continue CIR  Will speak to neurosurgery regarding neck incision induration, cont await further recs 2.  DVT Prophylaxis/Anticoagulation: SCDs  Vascular study negative for DVT  3. Pain Management: Skelaxin and oxycodone as needed 4. Mood: Lexapro 10 mg nightly, Xanax 0.25 mg 3 times daily as needed 5. Neuropsych: This patient is capable of making decisions on her own behalf. 6. Skin/Wound Care: Routine skin checks 7. Fluids/Electrolytes/Nutrition: Routine in and  outs  8. Hyponatremia: HCTZ held.    Sodium 131 on 9/13  Labs pending  Continue sodium chloride tablets, decreased to 1 mg twice a day on 9/14  Continue to monitor 9.  Bouts of syncope.  Monitor blood pressure when out of bed.  Echocardiogram with ejection fraction of 65%. 10.  Hypertension.  Hydralazine 50 mg every 8 hours  Labile on 9/16  Lasix 40 mg daily 9/13-9/15  Monitor with increased mobility 11.  History of diabetes mellitus.   Patient on Glucophage 850 mg twice daily prior to admission. Check CBG ac/HS. Resume as needed  Relatively controlled on 9/15  Monitor with increased mobility 12.  Hypothyroidism.  Synthroid 13.  Asthma/COPD.  Continue nebulizers and Singulair 14.  Constipation.  Laxative assistance 15. Hypoalbuminemia  Supplement initiated on 9/10 16. Acute blood loss anemia  Hemoglobin 10.7 on 9/13  Continue to monitor 17. Hypokalemia  K+ 3.0 on 9/13  Supplemented x3 days  Labs pending 18. Leukocytosis  WBCs 10.6 on 9/13  Afebrile  Continue to monitor   LOS (Days) 7 A FACE TO FACE EVALUATION WAS PERFORMED  Regina [name] Lorie Phenix 12/17/2017 9:05 AM

## 2017-12-17 NOTE — Progress Notes (Signed)
Occupational Therapy Session Note  Patient Details  Name: Regina [name] MRN: [id number] Date of Birth: 1941-02-19  Today's Date: 12/17/2017 OT Individual Time: 3005-1102 OT Individual Time Calculation (min): 60 min    Short Term Goals: Week 1:  OT Short Term Goal 1 (Week 1): Pt will perform sit to stand transfers with supervision for self care tasks  OT Short Term Goal 2 (Week 1): Pt will perform UB dressing with supervision assist  OT Short Term Goal 3 (Week 1): Pt will perform LB dressing with supervision assist   OT Short Term Goal 4 (Week 1): Pt will retrieve dressing items needed for dressing with LRAD and close supervision   Skilled Therapeutic Interventions/Progress Updates:    Pt sitting in recliner upon entry finishing breakfast with no reports of pain. Pt eager to take shower and participate in therapy. Pt performed all functional transfers this session with RW and close supervision for safety. Pt ambulated to closet and chose items needed for dressing routine, then ambulated to bathroom for toileting tasks. Pt with reports of difficultly wiping bottom due to shoulder ROM however pt able to partially complete requiring therapist for thoroughness. Pt ambulated to tub transfer bench and performed all bathing tasks while seated except for buttocks with therapist required as mentioned above. Pt with noted improvements with ability to manipulate and open containers this bathing session. Pt ambulated to w/c and performed all dressing tasks with supervision and required CGA from therapist while standing for pulling pants up. Cervical replacement pads replaced total A. Pt transferred to recliner and left in room with all needs in reach and chair alarm set.    Pt with concerns of handwriting ability this session. Pt attempted to "balance checkbook" this weekend and reports handwriting was illegible. Educated pt on importance of completing therapy putty HEP that was given to her on  Friday to improve hand strength and that results could potentially be slower than improvements in the shoulder or elbow. Pt with verbal understanding.   Therapy Documentation Precautions:  Precautions Precautions: Cervical, Fall Precaution Comments: Per MD orders: pt can take off brace for showering and walking to the bathroom  Required Braces or Orthoses: Cervical Brace Cervical Brace: Hard collar Restrictions Weight Bearing Restrictions: No  See Function Navigator for Current Functional Status.   Therapy/Group: Individual Therapy  Leanne Sisler 12/17/2017, 11:06 AM

## 2017-12-17 NOTE — Progress Notes (Signed)
Physical Therapy Session Note  Patient Details  Name: Regina [name] MRN: [id number] Date of Birth: February 09, 1941  Today's Date: 12/17/2017 PT Individual Time: 1000-1100 PT Individual Time Calculation (min): 60 min   Short Term Goals: Week 1:  PT Short Term Goal 1 (Week 1): Pt will perform bed mobility with S from flat bed PT Short Term Goal 2 (Week 1): Pt will perform transfers with S PT Short Term Goal 3 (Week 1): Pt will perform gait with LRAD x150' min guard PT Short Term Goal 4 (Week 1): Pt will ascend/descent 12 steps with B handrails and min guard  Skilled Therapeutic Interventions/Progress Updates:  Pt recieved sitting in recliner and agreeable to PT today. CGA ambulation with RW ~22ft to rehab gym. Gait is slow with small step length; forward flexed posture. CGA ascending/descending four 6 inch stairs with B handrails x 3 with focus on eccentric control during descent. Due to reports of a "tight calf," gastroc and soleus stretches x30 sec each were performed bilaterally with S. Pt provided with handout of stretches with pictures and instructions. Standing balance on foam with trunk rotation while reaching outside BOS to pass ball between 2 therapists with CGA. Pt able to self correct minor LOBs; no major LOBs. Reports "wobbly" legs upon fatigue. CGA stand pivot transfer to/from w/c. TotalA w/c transport back to room for energy/time conservation. Pt remained in recliner at the end of the session with all needs in reach and chair alarm intact.  Therapy Documentation Precautions:  Precautions Precautions: Cervical, Fall Precaution Comments: Per MD orders: pt can take off brace for showering and walking to the bathroom  Required Braces or Orthoses: Cervical Brace Cervical Brace: Hard collar Restrictions Weight Bearing Restrictions: No Pain: Pain Assessment Pain Scale: 0-10 Pain Score: 3  Pain Type: Surgical pain Pain Location: Neck Pain Orientation: Right;Left Pain  Radiating Towards: shoulder Pain Descriptors / Indicators: Aching;Discomfort Pain Frequency: Constant Pain Onset: On-going Pain Intervention(s): Medication (See eMAR)   See Function Navigator for Current Functional Status.   Therapy/Group: Individual Therapy  [place] Rilei Kravitz 12/17/2017, 12:01 PM

## 2017-12-17 NOTE — Progress Notes (Signed)
Physical Therapy Session Note  Patient Details  Name: Regina [name] MRN: [id number] Date of Birth: [birth year]  Today's Date: 12/17/2017 PT Individual Time: 0370-9643 PT Individual Time Calculation (min): 72 min   Short Term Goals: Week 1:  PT Short Term Goal 1 (Week 1): Pt will perform bed mobility with S from flat bed PT Short Term Goal 2 (Week 1): Pt will perform transfers with S PT Short Term Goal 3 (Week 1): Pt will perform gait with LRAD x150' min guard PT Short Term Goal 4 (Week 1): Pt will ascend/descent 12 steps with B handrails and min guard  Skilled Therapeutic Interventions/Progress Updates:    Patient received in restroom, pleasant and willing to participate with PT this afternoon. Able to complete all toileting with S today, then proceeded with gait training today approximately 1104f which was limited by neck pain. RN aware of pain status. Transported patient totalA via WC to Nustep, able to complete functional transfer with RW and S, VC for safety, able to ride Nustep for 10 minutes on level 3 with B UEs/LEs and heat pack across shoulders which reduced pain to 4-5/10. With pain more controlled, able to ambulate multiple distances of 150+ft throughout remainder of session. Worked on functional balance activities on foam pad with active, dynamic reaching and problem solving with PVC puzzle, Min guard and Min cues provided throughout and progressing stance from shoulder width distance between feet to semi-tandem stance on foam. Able to gait train back to room with RW, S, no rest breaks, and performed functional seated LE strengthening exercises in chair in room. She was left up in the chair with all needs met, alarm activated this afternoon.   Therapy Documentation Precautions:  Precautions Precautions: Cervical, Fall Precaution Comments: Per MD orders: pt can take off brace for showering and walking to the bathroom  Required Braces or Orthoses: Cervical Brace Cervical  Brace: Hard collar Restrictions Weight Bearing Restrictions: No General:  Pain: Pain Assessment Pain Scale: 0-10 Pain Score: 7  Pain Type: Acute pain;Surgical pain Pain Location: Neck Pain Orientation: Left Pain Radiating Towards: shoulder Pain Descriptors / Indicators: Aching;Sharp Pain Frequency: Constant Pain Onset: On-going Patients Stated Pain Goal: 0 Pain Intervention(s): Heat applied;RN made aware   See Function Navigator for Current Functional Status.   Therapy/Group: Individual Therapy   KDeniece ReePT, DPT, CBIS  Supplemental Physical Therapist [facility]   Pager [phone] Rehab Office [phone]   12/17/2017, 4:07 PM

## 2017-12-18 ENCOUNTER — Inpatient Hospital Stay (HOSPITAL_COMMUNITY): Payer: Federal, State, Local not specified - PPO | Admitting: Occupational Therapy

## 2017-12-18 ENCOUNTER — Inpatient Hospital Stay (HOSPITAL_COMMUNITY): Payer: Federal, State, Local not specified - PPO | Admitting: Physical Therapy

## 2017-12-18 LAB — GLUCOSE, CAPILLARY
Glucose-Capillary: 99 mg/dL (ref 70–99)
Glucose-Capillary: 89 mg/dL (ref 70–99)
Glucose-Capillary: 87 mg/dL (ref 70–99)
Glucose-Capillary: 98 mg/dL (ref 70–99)

## 2017-12-18 NOTE — Progress Notes (Signed)
Occupational Therapy Session Note  Patient Details  Name: Regina [name] MRN: [id number] Date of Birth: 1941/03/20  Today's Date: 12/18/2017 OT Individual Time:  1400-1500 (60 mins)       Short Term Goals: Week 1:  OT Short Term Goal 1 (Week 1): Pt will perform sit to stand transfers with supervision for self care tasks  OT Short Term Goal 2 (Week 1): Pt will perform UB dressing with supervision assist  OT Short Term Goal 3 (Week 1): Pt will perform LB dressing with supervision assist   OT Short Term Goal 4 (Week 1): Pt will retrieve dressing items needed for dressing with LRAD and close supervision   Skilled Therapeutic Interventions/Progress Updates:    Pt sitting in recliner upon entry with reports of fatigue but no pain. Husband arrived at beginning of session. Pt performed all functional transfers with varying min A-close supervision depending on surface height. Per pt's request, handwriting addressed seated in recliner in room, pt educated on positioning for handwriting and carryover to self care tasks such as feeding and filling medicine bottles. Pt positioned with forearms on adjustable table and wrote name x5 times successfully. Pt ambulated halfway down hallway with RW however noted knee buckling, pt able to self correct, however recommended for pt to transfer to w/c total A for energy conservation to apartment. Discussed with pt and her husband regarding walk in shower set up. Transfer onto shower chair demonstrated by therapist with pt able return demonstrate and clear simulated ledge with BLE's with CGA from therapist for safety. Pt exited shower simulation and transferred onto recliner in prep for [facility] activity.   Seated at edge of recliner for unsupported sitting, pt performed [facility] task with pill bottles, beads and weekly pill box in preparation for d/c home. Pt difficulty with smaller pills with reports that "I still can't feel things very well" with pt dropping ~3 pills.  Education provided regarding positioning at large table and towel on table for increased grip. Pt returned to room as mentioned above and left in room with all needs in reach, seated in recliner, husband present.   Therapy Documentation Precautions:  Precautions Precautions: Cervical, Fall Precaution Comments: Per MD orders: pt can take off brace for showering and walking to the bathroom  Required Braces or Orthoses: Cervical Brace Cervical Brace: Hard collar Restrictions Weight Bearing Restrictions: No  See Function Navigator for Current Functional Status.   Therapy/Group: Individual Therapy  Antionio Negron 12/18/2017, 2:44 PM

## 2017-12-18 NOTE — Progress Notes (Signed)
[place] PHYSICAL MEDICINE & REHABILITATION     PROGRESS NOTE  Subjective/Complaints:  Patient seen lying in bed this morning.  She states she slept well overnight.  She has questions about discharge medications.  ROS: Denies CP, SOB, nausea, vomiting, diarrhea.  Objective: Vital Signs: Blood pressure (!) 155/74, pulse 81, temperature 98.5 F (36.9 C), temperature source Oral, resp. rate 18, SpO2 96 %. No results found. No results for input(s): WBC, HGB, HCT, PLT in the last 72 hours. Recent Labs    12/17/17 1518  NA 135  K 4.0  CL 99  GLUCOSE 106*  BUN 18  CREATININE 0.73  CALCIUM 9.0   CBG (last 3)  Recent Labs    12/17/17 1704 12/17/17 2115 12/18/17 0618  GLUCAP 99 107* 99    Wt Readings from Last 3 Encounters:  12/01/17 72.8 kg  11/29/17 71.9 kg  02/17/13 81.2 kg    Physical Exam:  BP (!) 155/74 (BP Location: Right Arm)   Pulse 81   Temp 98.5 F (36.9 C) (Oral)   Resp 18   SpO2 96%  Constitutional: She appears well-developed and well-nourished.  HENT: Normocephalic and atraumatic.  Eyes: EOM are normal. No discharge. Neck: c-collar in place.  Cardiovascular: RRR.  No JVD.  Respiratory: Effort normal and breath sounds normal.  GI: Bowel sounds are normal. She exhibits no distension.  Musculoskeletal: LE edema, improved Neurological: She is alert and oriented.  Follows full commands Motor: Bilateral upper extremities: Shoulder flexion, elbow flexion/extension, wrist extension 4/5, hand grip 3+/5, stable Bilateral lower extremities: 4+/5 proximal distal, stable Skin: neck incision with edema and induration, stable Psychiatric: She has a normal mood and affect. Her behavior is normal.   Assessment/Plan: 1. 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 Physiatrist is providing  close team supervision and 24 hour management of active medical problems listed below. Physiatrist and rehab team continue to assess barriers to discharge/monitor patient progress toward functional and medical goals.  Function:  Bathing Bathing position Bathing activity did not occur: Refused Position: Production manager parts bathed by patient: Right arm, Left arm, Chest, Abdomen, Front perineal area, Right upper leg, Left lower leg, Right lower leg, Left upper leg Body parts bathed by helper: Buttocks  Bathing assist Assist Level: Touching or steadying assistance(Pt > 75%)      Upper Body Dressing/Undressing Upper body dressing   What is the patient wearing?: Pull over shirt/dress, Orthosis Bra - Perfomed by patient: Thread/unthread right bra strap, Thread/unthread left bra strap, Hook/unhook bra (pull down sports bra)   Pull over shirt/dress - Perfomed by patient: Thread/unthread right sleeve, Thread/unthread left sleeve, Put head through opening, Pull shirt over trunk Pull over shirt/dress - Perfomed by helper: Put head through opening     Orthosis activity level: Performed by helper  Upper body assist Assist Level: Set up   Set up : To apply TLSO, cervical collar  Lower Body Dressing/Undressing Lower body dressing   What is the patient wearing?: Underwear, Pants, Shoes Underwear - Performed by patient: Thread/unthread right underwear leg, Thread/unthread left underwear leg, Pull underwear up/down   Pants- Performed by patient: Thread/unthread right pants leg, Thread/unthread left pants leg, Pull pants up/down   Non-skid slipper socks- Performed by patient: Don/doff right sock, Don/doff left sock       Shoes - Performed by patient: Don/doff right shoe, Don/doff left shoe  Lower body assist Assist for lower body dressing: Touching or steadying assistance (Pt > 75%)      Toileting Toileting   Toileting steps completed by patient: Adjust clothing  prior to toileting, Performs perineal hygiene, Adjust clothing after toileting Toileting steps completed by helper: Performs perineal hygiene Toileting Assistive Devices: Grab bar or rail  Toileting assist Assist level: Supervision or verbal cues   Transfers Chair/bed transfer   Chair/bed transfer method: Stand pivot Chair/bed transfer assist level: Supervision or verbal cues Chair/bed transfer assistive device: Medical sales representative     Max distance: 150(limited by pain ) Assist level: Supervision or verbal cues   Wheelchair Wheelchair activity did not occur: N/A Type: Manual Max wheelchair distance: 120 Assist Level: Supervision or verbal cues  Cognition Comprehension Comprehension assist level: Understands complex 90% of the time/cues 10% of the time  Expression Expression assist level: Expresses basic needs/ideas: With extra time/assistive device  Social Interaction Social Interaction assist level: Interacts appropriately with others with medication or extra time (anti-anxiety, antidepressant).  Problem Solving Problem solving assist level: Solves complex 90% of the time/cues < 10% of the time  Memory Memory assist level: Recognizes or recalls 90% of the time/requires cueing < 10% of the time    Medical Problem List and Plan: 1.  Decreased functional mobility secondary to cervical stenosis/spondylosis/myelopathy.  Status post discectomy C3-4, 4-5 and 5-6 for decompression of spinal cord and existing nerve roots 12/06/2017.  Cervical collar  Continue CIR  Will speak to neurosurgery regarding neck incision induration, cont await further recs 2.  DVT Prophylaxis/Anticoagulation: SCDs  Vascular study negative for DVT  3. Pain Management: Skelaxin and oxycodone as needed 4. Mood: Lexapro 10 mg nightly, Xanax 0.25 mg 3 times daily as needed 5. Neuropsych: This patient is capable of making decisions on her own behalf. 6. Skin/Wound Care: Routine skin checks 7.  Fluids/Electrolytes/Nutrition: Routine in and outs  8. Hyponatremia: HCTZ held. Resolved   Sodium 135 on 9/16  Continue sodium chloride tablets, decreased to 1 mg twice a day on 9/14  Continue to monitor 9.  Bouts of syncope.  Monitor blood pressure when out of bed.  Echocardiogram with ejection fraction of 65%. 10.  Hypertension.  Hydralazine 50 mg every 8 hours  Labile on 9/17  Lasix 40 mg daily 9/13-9/15  Monitor with increased mobility 11.  History of diabetes mellitus.   Patient on Glucophage 850 mg twice daily prior to admission. Check CBG ac/HS. Resume as needed  Controlled on 9/17  Monitor with increased mobility 12.  Hypothyroidism.  Synthroid 13.  Asthma/COPD.  Continue nebulizers and Singulair 14.  Constipation.  Laxative assistance 15. Hypoalbuminemia  Supplement initiated on 9/10 16. Acute blood loss anemia  Hemoglobin 10.7 on 9/13  Continue to monitor 17. Hypokalemia: Resolved  K+ 4.0 on 9/16  Supplemented x3 days 18. Leukocytosis  WBCs 10.6 on 9/13  Afebrile  Continue to monitor   LOS (Days) 8 A FACE TO FACE EVALUATION WAS PERFORMED  Ankit Lorie Phenix 12/18/2017 9:03 AM

## 2017-12-18 NOTE — Progress Notes (Addendum)
Physical Therapy Session Note  Patient Details  Name: Regina [name] MRN: [id number] Date of Birth: [birth year]  Today's Date: 12/18/2017 PT Individual Time: 0900-1000 PT Individual Time Calculation (min): 60 min   Short Term Goals: Week 1:  PT Short Term Goal 1 (Week 1): Pt will perform bed mobility with S from flat bed PT Short Term Goal 2 (Week 1): Pt will perform transfers with S PT Short Term Goal 3 (Week 1): Pt will perform gait with LRAD x150' min guard PT Short Term Goal 4 (Week 1): Pt will ascend/descent 12 steps with B handrails and min guard  Skilled Therapeutic Interventions/Progress Updates:     Pt received sitting in recliner. She denied pain and is eager to begin PT treatment. TotalA w/c transport to rehab gym for energy and time conservation. Treatment focused on static and dynamic standing balance to improve balance and decrease risk of fall. CGA standing balance on bosu ball between parallel bars with feet shoulder width apart: trial 1 - eyes open, trial 2 - eyes closed, trial 3 and 4 - eyes open with ball catching and throwing. Pt exhibited increased righting reactions and improved ankle strategy to maintain balance throughout the duration of each trial. Standing L gastroc/soelus stretches in parallel bars due to "tight, pulling" sensation in L calf. CGA dynamic balance with RW through obstacle course consisting of objects to step over, compliant and uneven surface to ascend/descend, objects to step on/off, and objects to weave through for direction change. Pt successfully maneuvered her way through the course without any stumbles or LOBs. She exhibited slow speed and precaution while navigating the obstacle course. Gait with HHA x1 on L and CGA for ~142ft. Pt reports L leg feels "heavy," likely due to fatigue from the session. Pt remained in locked wheelchair with all needs in reach.   Therapy Documentation Precautions:  Precautions Precautions: Cervical,  Fall Precaution Comments: Per MD orders: pt can take off brace for showering and walking to the bathroom  Required Braces or Orthoses: Cervical Brace Cervical Brace: Hard collar Restrictions Weight Bearing Restrictions: No   See Function Navigator for Current Functional Status.   Therapy/Group: Individual Therapy  [place] Ricarda Atayde, SPT 12/18/2017, 10:29 AM

## 2017-12-18 NOTE — Progress Notes (Signed)
Physical Therapy Session Note  Patient Details  Name: Regina [name] MRN: [id number] Date of Birth: 1940-08-13  Today's Date: 12/18/2017 PT Individual Time: 3013-1438 PT Individual Time Calculation (min): 40 min   Short Term Goals: Week 1:  PT Short Term Goal 1 (Week 1): Pt will perform bed mobility with S from flat bed PT Short Term Goal 2 (Week 1): Pt will perform transfers with S PT Short Term Goal 3 (Week 1): Pt will perform gait with LRAD x150' min guard PT Short Term Goal 4 (Week 1): Pt will ascend/descent 12 steps with B handrails and min guard  Skilled Therapeutic Interventions/Progress Updates:   Pt in recliner and agreeable to therapy, denies pain. Session focused on endurance w/ w/c mobility, standing tolerance and fine motor control w/ UEs. Self-propelled w/c to/from therapy gym w/ BUEs, >150' each way. Performed multiple bouts of prolonged standing w/o UE support while performing fine motor tasks. Verbal cues for balance strategies w/ trunk rotation and anterior reaching. Stood for 5+ min at a time. Returned to room via w/c, ended session in recliner and in care of husband, all needs met.   Therapy Documentation Precautions:  Precautions Precautions: Cervical, Fall Precaution Comments: Per MD orders: pt can take off brace for showering and walking to the bathroom  Required Braces or Orthoses: Cervical Brace Cervical Brace: Hard collar Restrictions Weight Bearing Restrictions: No Vital Signs: Therapy Vitals Temp: 98 F (36.7 C) Temp Source: Oral Pulse Rate: 70 Resp: 16 BP: (!) 123/53 Patient Position (if appropriate): Sitting Oxygen Therapy SpO2: 100 % O2 Device: Room Air  See Function Navigator for Current Functional Status.   Therapy/Group: Individual Therapy  Niyati Heinke Clent Demark 12/18/2017, 5:08 PM

## 2017-12-18 NOTE — Progress Notes (Signed)
Occupational Therapy Session Note  Patient Details  Name: Regina [name] MRN: [id number] Date of Birth: [birth year]  Today's Date: 12/18/2017 OT Individual Time: 1000-1046 OT Individual Time Calculation (min): 46 min    Short Term Goals: Week 1:  OT Short Term Goal 1 (Week 1): Pt will perform sit to stand transfers with supervision for self care tasks  OT Short Term Goal 2 (Week 1): Pt will perform UB dressing with supervision assist  OT Short Term Goal 3 (Week 1): Pt will perform LB dressing with supervision assist   OT Short Term Goal 4 (Week 1): Pt will retrieve dressing items needed for dressing with LRAD and close supervision   Skilled Therapeutic Interventions/Progress Updates:    Pt presents sitting up in w/c agreeable to OT tx session, no c/o pain. Transported pt via w/c to therapy gym for time management. Pt performing stand pivot transfers throughout session using RW with minA. Seated on mat table Pt engaged in seated and standing FM activities using bil UEs. Pt wishing to practice handwriting as she reports this task has become increasingly difficult. Practiced writing name/alphabet given increased time/effort and discussed variety of ways to continue practicing during her off time from therapy. Pt completed additional bimanual tasks including standing while folding/stacking wash cloths; additional focus on fine motor/strengthening during clothespins activity utilizing tripod grasp for task completion. Pt requiring increased time for coordinating/placing clothespins accurately on board. Pt completing standing portion of activity with minguard for standing balance both with single UE support and no UE support. Pt transferred back to w/c and transported to room where pt was left seated in recliner with chair alarm set, call bell and needs within reach.   Therapy Documentation Precautions:  Precautions Precautions: Cervical, Fall Precaution Comments: Per MD orders: pt can  take off brace for showering and walking to the bathroom  Required Braces or Orthoses: Cervical Brace Cervical Brace: Hard collar Restrictions Weight Bearing Restrictions: No       See Function Navigator for Current Functional Status.   Therapy/Group: Individual Therapy  Raymondo Band 12/18/2017, 7:55 AM

## 2017-12-19 ENCOUNTER — Inpatient Hospital Stay (HOSPITAL_COMMUNITY): Payer: Federal, State, Local not specified - PPO | Admitting: Physical Therapy

## 2017-12-19 ENCOUNTER — Inpatient Hospital Stay (HOSPITAL_COMMUNITY): Payer: Federal, State, Local not specified - PPO

## 2017-12-19 ENCOUNTER — Inpatient Hospital Stay (HOSPITAL_COMMUNITY): Payer: Federal, State, Local not specified - PPO | Admitting: Occupational Therapy

## 2017-12-19 LAB — GLUCOSE, CAPILLARY
Glucose-Capillary: 91 mg/dL (ref 70–99)
Glucose-Capillary: 88 mg/dL (ref 70–99)
Glucose-Capillary: 90 mg/dL (ref 70–99)
Glucose-Capillary: 93 mg/dL (ref 70–99)

## 2017-12-19 NOTE — Discharge Instructions (Signed)
Inpatient Rehab Discharge Instructions  Regina [name] Discharge date and time: No discharge date for patient encounter.   Activities/Precautions/ Functional Status: Activity: Cervical collar as directed Diet: regular diet Wound Care: keep wound clean and dry Functional status:  ___ No restrictions     ___ Walk up steps independently ___ 24/7 supervision/assistance   ___ Walk up steps with assistance ___ Intermittent supervision/assistance  ___ Bathe/dress independently ___ Walk with walker     _x__ Bathe/dress with assistance ___ Walk Independently    ___ Shower independently ___ Walk with assistance    ___ Shower with assistance ___ No alcohol     ___ Return to work/school ________  Special Instructions: Hold aspirin until follow-up with neurosurgery Dr. Kathyrn Sheriff   COMMUNITY REFERRALS UPON DISCHARGE:    Home Health:   PT & OT  Agency:[facility] Phone:[phone]   Date of last service:12/20/2017  Medical Equipment/Items Ordered:YOUTH ROLLING WALKER, 3 IN 1, TUB BENCH  Agency/Supplier:[facility]   [phone]   My questions have been answered and I understand these instructions. I will adhere to these goals and the provided educational materials after my discharge from the hospital.  Patient/Caregiver Signature _______________________________ Date __________  Clinician Signature _______________________________________ Date __________  Please bring this form and your medication list with you to all your follow-up doctor's appointments.

## 2017-12-19 NOTE — Progress Notes (Signed)
Physical Therapy Discharge Summary  Patient Details  Name: Regina [name] MRN: [id number] Date of Birth: 08-Oct-1940  Today's Date: 12/19/2017 PT Individual Time: 1100-1200 PT Individual Time Calculation (min): 60 min    Patient has met 9 of 10 long term goals due to improved activity tolerance, improved balance, improved postural control, increased strength and decreased pain.  Patient to discharge at an ambulatory level Supervision.   Patient's care partner is independent to provide the necessary physical assistance at discharge.  Reasons goals not met: requires minA for bed mobility (sit >supine); husband confirms he is able to provide this level assist  Recommendation:  Patient will benefit from ongoing skilled PT services in home health setting to continue to advance safe functional mobility, address ongoing impairments in strength, balance, activity tolerance, and minimize fall risk.  Equipment: pediatric RW  Reasons for discharge: treatment goals met and discharge from hospital  Patient/family agrees with progress made and goals achieved: Yes  PT Discharge Precautions/Restrictions Precautions Precautions: Cervical Precaution Comments: Per MD orders: pt can take off brace for showering and walking to the bathroom  Required Braces or Orthoses: Cervical Brace Cervical Brace: Hard collar Restrictions Weight Bearing Restrictions: No Pain Pain Assessment Pain Scale: 0-10 Pain Score: 7  Pain Type: Acute pain;Surgical pain Pain Location: Neck Pain Orientation: Posterior;Anterior Pain Radiating Towards: shoulders Pain Frequency: Constant Pain Onset: On-going Pain Intervention(s): Medication (See eMAR) Vision/Perception  Perception Perception: Within Functional Limits Praxis Praxis: Intact  Cognition Overall Cognitive Status: Within Functional Limits for tasks assessed Arousal/Alertness: Awake/alert Orientation Level: Oriented X4 Attention:  Alternating Selective Attention: Appears intact Alternating Attention: Appears intact Memory: Appears intact Awareness: Appears intact Problem Solving: Appears intact Safety/Judgment: Appears intact Sensation Sensation Light Touch: Appears Intact Hot/Cold: Not tested Proprioception: Not tested Stereognosis: Not tested Coordination Gross Motor Movements are Fluid and Coordinated: No Fine Motor Movements are Fluid and Coordinated: No Coordination and Movement Description: Increased difficulty placing items on counter requiring increased time  Motor  Motor Motor: Other (comment) Motor - Discharge Observations: decreased LE coordination  Mobility Bed Mobility Bed Mobility: Sit to Supine;Supine to Sit Supine to Sit: Supervision/Verbal cueing Sit to Supine: Contact Guard/Touching assist Transfers Transfers: Sit to Stand;Stand to Sit;Stand Pivot Transfers Sit to Stand: Independent with assistive device Stand to Sit: Independent with assistive device Stand Pivot Transfers: Independent with assistive device Transfer (Assistive device): Rolling walker Locomotion  Gait Ambulation: Yes Gait Assistance: Supervision/Verbal cueing Gait Distance (Feet): 150 Feet Assistive device: Rolling walker Gait Assistance Details: Verbal cues for precautions/safety Gait Gait: Yes Gait Pattern: Impaired Gait Pattern: Poor foot clearance - right;Poor foot clearance - left;Trunk flexed;Decreased trunk rotation;Wide base of support;Right flexed knee in stance;Left flexed knee in stance;Right foot flat;Left foot flat;Decreased stride length Gait velocity: 0.8 ft/sec Stairs / Additional Locomotion Stairs: Yes Stairs Assistance: Supervision/Verbal cueing Stair Management Technique: Two rails;Step to pattern;Forwards Number of Stairs: 4 Height of Stairs: 6 Ramp: Supervision/Verbal cueing Curb: Supervision/Verbal cueing Wheelchair Mobility Wheelchair Mobility: No  Trunk/Postural Assessment   Cervical Assessment Cervical Assessment: Exceptions to WFL(Pt with cervical preventing movement) Thoracic Assessment Thoracic Assessment: Exceptions to WFL(rounded shoulders ) Lumbar Assessment Lumbar Assessment: Within Functional Limits Postural Control Postural Control: Within Functional Limits  Balance Balance Balance Assessed: Yes Standardized Balance Assessment Standardized Balance Assessment: Berg Balance Test;Timed Up and Go Test Berg Balance Test Sit to Stand: Able to stand without using hands and stabilize independently Standing Unsupported: Able to stand safely 2 minutes Sitting with Back Unsupported but Feet Supported on Floor  or Stool: Able to sit safely and securely 2 minutes Stand to Sit: Sits safely with minimal use of hands Transfers: Able to transfer safely, minor use of hands Standing Unsupported with Eyes Closed: Able to stand 10 seconds with supervision Standing Ubsupported with Feet Together: Needs help to attain position and unable to hold for 15 seconds From Standing, Reach Forward with Outstretched Arm: Reaches forward but needs supervision From Standing Position, Pick up Object from Floor: Able to pick up shoe, needs supervision From Standing Position, Turn to Look Behind Over each Shoulder: Needs supervision when turning Turn 360 Degrees: Needs close supervision or verbal cueing Standing Unsupported, Alternately Place Feet on Step/Stool: Able to complete >2 steps/needs minimal assist Standing Unsupported, One Foot in Front: Able to take small step independently and hold 30 seconds Standing on One Leg: Unable to try or needs assist to prevent fall Total Score: 32 Timed Up and Go Test TUG: Normal TUG Normal TUG (seconds): 34(RW) Static Sitting Balance Static Sitting - Balance Support: No upper extremity supported Static Sitting - Level of Assistance: 6: Modified independent (Device/Increase time) Dynamic Sitting Balance Dynamic Sitting - Balance Support:  No upper extremity supported;Feet supported Dynamic Sitting - Level of Assistance: 6: Modified independent (Device/Increase time) Static Standing Balance Static Standing - Balance Support: During functional activity;Bilateral upper extremity supported Static Standing - Level of Assistance: 6: Modified independent (Device/Increase time) Dynamic Standing Balance Dynamic Standing - Balance Support: Bilateral upper extremity supported;During functional activity Dynamic Standing - Level of Assistance: 6: Modified independent (Device/Increase time) Dynamic Standing - Balance Activities: Lateral lean/weight shifting;Reaching for objects;Reaching across midline Extremity Assessment  RUE Assessment RUE Assessment: Exceptions to [facility] Passive Range of Motion (PROM) Comments: shoulder flexion ~120 degrees Active Range of Motion (AROM) Comments: shoulder flexion ~100 degrees  General Strength Comments: Shoulder was not fully tested due to cervical restrictions; elbow flexion/extension: 4/5; pt able to brush hair and perform functional tasks  LUE Assessment LUE Assessment: Exceptions to [facility] Passive Range of Motion (PROM) Comments: shoulder flexion ~120 degrees  Active Range of Motion (AROM) Comments: shoulder flexion ~100 degrees  General Strength Comments: Shouler: not fully tested due to cervical restrictions; elbow flexion/extension: 4/5 however pt able to perform functional tasks and reach overhead RLE Assessment RLE Assessment: Exceptions to [facility] RLE Strength Right Hip Flexion: 4-/5 Right Hip Extension: 4/5 Right Hip ABduction: 4+/5 Right Hip ADduction: 4+/5 Right Knee Flexion: 4+/5 Right Knee Extension: 4+/5 Right Ankle Dorsiflexion: 4+/5 Right Ankle Plantar Flexion: 4+/5 LLE Assessment LLE Assessment: Exceptions to [facility] LLE Strength Left Hip Flexion: 4-/5 Left Hip Extension: 4/5 Left Hip ABduction: 4+/5 Left Hip ADduction: 4+/5 Left Knee Flexion: 4+/5 Left Knee Extension: 4+/5 Left  Ankle Dorsiflexion: 4+/5 Left Ankle Plantar Flexion: 4+/5  Skilled Therapeutic Intervention: Pt received seated in recliner; denies pain and agreeable to treatment. Assessed mobility as above with modI/S overall with RW. Reviewed recommendations with pt and husband to continue RW use until further progress with HHPT. Pt requires minA for LE into bed sit >supine; per pt and husband he will be able to provide that amount of assist at home. Pt and husband with no further questions or concerns regarding d/c home at this time. Remained in recliner at end of session, all needs in reach.    See Function Navigator for Current Functional Status.  Benjiman Core Seigle 12/19/2017, 12:25 PM

## 2017-12-19 NOTE — Discharge Summary (Signed)
NAME: Regina [name], [name] MEDICAL RECORD [id number] ACCOUNT [id number] DATE OF BIRTH:1940-07-08 FACILITY: [facility] LOCATION: [facility] PHYSICIAN:ANKIT PATEL, MD  DISCHARGE SUMMARY  DATE OF DISCHARGE:  12/20/2017  ADMIT DATE:  12/10/2017  DISCHARGE DATE:  12/20/2017  DISCHARGE DIAGNOSES: 1.  Cervical stenosis, spondylosis with myelopathy, status post diskectomy C3-4, 4-5, and 5-6. 2.  Sequential compression devices for deep venous thrombosis prophylaxis. 3.  Pain management. 4.  Mood. 5.  Hyponatremia, resolved.   6.  Bouts of syncope. 7.  Hypertension. 8.  History of diabetes mellitus. 9.  Hypothyroidism. 10. Asthma with chronic obstructive pulmonary disease.   11.  Constipation. 12.  Acute blood loss anemia.   13.  Hypokalemia.  HISTORY OF PRESENT ILLNESS:  This is a 77 year old right-handed female with history of diabetes mellitus and Hashimoto's disease.  Per chart review, lives with her husband, independent prior to admission, a 1-level home.  Husband with history of CVA and  limited physically.  Presented 12/01/2017 with recent workup for hyponatremia as well as syncope.  Followed outpatient by neurosurgery for cervical stenosis and myelopathy.  Recent MRI imaging revealed broad-based disk protrusion C3-4, 4-5 and 5-6 with  cervical stenosis with myelopathy.  Underwent diskectomy for decompression of spinal cord existing nerve roots 12/06/2017.  HOSPITAL COURSE:  Pain management.  Hard cervical collar in place.  Sodium level is 129-131.  HCTZ was held.  TSH level is normal.  No evidence of adrenal insufficiency.  Bouts of hypokalemia with supplement added.  Echocardiogram with ejection fraction  of 65%, no wall motion abnormalities.  The patient was admitted for comprehensive rehabilitation program.  PAST MEDICAL HISTORY:  See discharge diagnoses.  SOCIAL HISTORY:  Lives with spouse, independent prior to admission.  FUNCTIONAL STATUS:  Upon admission to rehab  services was moderate assist sit to stand, minimal assist sit to supine, mod/max assist with activities of daily living.  PHYSICAL EXAMINATION: VITAL SIGNS:  Blood pressure 116/46, pulse 67, temperature 98, respirations 18. GENERAL:  Alert female in no acute distress.  Oriented x3. HEENT:  EOMs intact. NECK:  Cervical collar in place. CARDIOVASCULAR:  Rate controlled. ABDOMEN:  Soft, nontender, good bowel sounds. LUNGS:  Clear to auscultation without wheeze.  REHABILITATION HOSPITAL COURSE:  The patient was admitted to inpatient rehabilitation services.  Therapies initiated on a 3-hour daily basis, consisting of physical therapy, occupational therapy and rehabilitation nursing.  The following issues were  addressed during patient's rehabilitation stay.  Pertaining to the patient's cervical stenosis myelopathy, she had undergone diskectomy, decompression 12/06/2017.  She would follow up with neurosurgery.  Cervical collar as directed.  Follow up with  neurosurgery for neck incision induration.  The patient remained afebrile.  SCDs for DVT prophylaxis.  Vascular studies negative.  Pain management with the use of Skelaxin.  Oxycodone as needed.  Mood stabilization with Lexapro, Xanax.  She was attending  full therapies.  Hyponatremia resolved and improved 135.  HCTZ remained held.  She was on sodium chloride tablets.  Noted bouts of syncope prior to hospital stay.  A workup was noted.  Echocardiogram, ejection fraction 65%.  Blood pressure is well  controlled.  No signs of fluid overload.  Blood sugars monitored.  She had been on Glucophage prior to admission had been recently held due to some hypoglycemia.  She continued on hormone supplement for hypothyroidism.  Acute blood loss anemia 10.7.  No  bleeding episodes.  The patient received weekly collaborative interdisciplinary team conferences to discuss estimated length of stay, family teaching, any barriers  to her discharge.  Sessions focused on  wheelchair mobility, standing tolerance,  self-propelling her wheelchair, ambulates 100 feet with assistive device, up to 150 feet.  Worked on functional balance activities, dynamic reaching, minimal guard and cues, gathering belongings for activities of daily living and homemaking.  Full family  teaching was completed and planned discharge to home.  DISCHARGE MEDICATIONS:  Included Lexapro 10 mg p.o. at bedtime, Flonase 1 spray each nostril daily, hydralazine 50 mg q.8h., Synthroid 75 mcg daily, Singulair 10 mg p.o. at bedtime, Senokot 1 tablet p.o. b.i.d., Xanax 0.25 mg 3 times daily as needed, resume Glucophage as prior to admission Skelaxin 800 mg p.o. t.i.d. as needed, oxycodone 5 mg every 3 hours as needed.  DIET:  Her diet was a dysphagia #2 Diet.  Her Glucophage currently remained on hold.  Blood sugars 89-98.  FOLLOWUP:  The patient would follow up with Dr. Delice Lesch at the outpatient rehab service office as directed; Dr. Kathyrn Sheriff neurosurgery, call for appointment; Dr. Maurice Small medical management, cervical collar as directed.  Hold aspirin until followup  with neurosurgery, Dr. Kathyrn Sheriff.  TN/NUANCE D:12/19/2017 T:12/19/2017 JOB:[id number]

## 2017-12-19 NOTE — Progress Notes (Signed)
Social Work  Seirra Kos, Eliezer Champagne  Social Worker  Physical Medicine and Rehabilitation  Patient Care Conference  Cosign Needed  Date of Service:  12/19/2017  4:09 PM          Cosign Needed        Show:Clear all [x] Manual[x] Template[] Copied  Added by: [x] Raford Brissett, Gardiner Rhyme, LCSW  [] Hover for details Inpatient RehabilitationTeam Conference and Plan of Care Update Date: 12/19/2017   Time: 2:00 PM      Patient Name: Regina [name]      Medical Record Number: [id number]  Date of Birth: [birth year] Sex: Female         Room/Bed: 4M11C/4M11C-01 Payor Info: Payor: MEDICARE / Plan: MEDICARE PART A / Product Type: *No Product type* /     Admitting Diagnosis: Cervical Myelopathy  Admit Date/Time:  12/10/2017  4:53 PM Admission Comments: No comment available    Primary Diagnosis:  <principal problem not specified> Principal Problem: <principal problem not specified>       Patient Active Problem List    Diagnosis Date Noted  . Leukocytosis    . Hypokalemia    . Labile blood pressure    . Benign essential HTN    . Postoperative pain    . Hypertensive crisis    . Diabetes 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)    . Hypoalbuminemia due to protein-calorie malnutrition ([place])    . Acute blood loss anemia    . Myelopathy ([place]) 12/10/2017  . HNP (herniated nucleus pulposus) with myelopathy, cervical    . Essential hypertension    . Uncomplicated asthma    . Insulin resistance    . Hashimoto's disease    . Cervical spondylolysis    . Anxiety    . Hyponatremia 12/01/2017  . Hypothyroidism 12/01/2017      Expected Discharge Date: Expected Discharge Date: 12/20/17   Team Members Present: Physician leading conference: Dr. Delice Lesch Social Worker Present: Ovidio Kin, LCSW Nurse Present: Benjie Karvonen, RN PT Present: Kem Parkinson, PT OT Present: Napoleon Form, OT SLP Present: Windell Moulding, SLP PPS Coordinator present : Daiva Nakayama, RN, CRRN       Current  Status/Progress Goal Weekly Team Focus  Medical     Decreased functional mobility secondary to cervical stenosis/spondylosis/myelopathy.  Status post discectomy C3-4, 4-5 and 5-6 for decompression of spinal cord and existing nerve roots 12/06/2017.   Improve mobility, transfers, WBCs, BP  See above   Bowel/Bladder     continent of b/b LBM 9/18, mod A, up to BR w rw, senna scheduled  continent of b/b with mod I A  monitor b/b q shift and prn   Swallow/Nutrition/ Hydration               ADL's     Mod I for all self care tasks and functional transfers except shower transfer/bathing   D/c mod I with supervision for bathing   D/c 9/19 with family available    Mobility     modI transfers, S gait with RW  modI transfers, S gait with LRAD  activity tolerance, LE strength/ROM, dynamic balance   Communication               Safety/Cognition/ Behavioral Observations             Pain     oxy 5 mg q 3 h prn for neck/shoulder, incision pain, pt takes every 3-4 hours, prn skelaxin, incison with swelling and tenderness  Pain <2  monitor pain  q shift and prn   Skin     incision OTA, anterior, swelling, tender, aspen collar at all times  maintiain skin integrity while on IPR  monitor skin q shift and prn     *See Care Plan and progress notes for long and short-term goals.      Barriers to Discharge   Current Status/Progress Possible Resolutions Date Resolved   Physician     Medical stability     See above  Therapies, optimize BP meds, follow labs      Nursing                 PT                    OT                 SLP            SW              Discharge Planning/Teaching Needs:  Husband here daily to obsereve in therapies. She needs to build her confidence before going home.      Team Discussion:  Reaching goals of mod/i-supervision level. Family training with husband and able to do what pt needs. Medically stable for DC tomorrow. Still waiting for surgeon to come by and l;ook at  incision.   Revisions to Treatment Plan:  DC 9/19    Continued Need for Acute Rehabilitation Level of Care: The patient requires daily medical management by a physician with specialized training in physical medicine and rehabilitation for the following conditions: Daily direction of a multidisciplinary physical rehabilitation program to ensure safe treatment while eliciting the highest outcome that is of practical value to the patient.: Yes Daily medical management of patient stability for increased activity during participation in an intensive rehabilitation regime.: Yes Daily analysis of laboratory values and/or radiology reports with any subsequent need for medication adjustment of medical intervention for : Neurological problems;Blood pressure problems;Other     I attest that I was present, lead the team conference, and concur with the assessment and plan of the team.     [name] 12/19/2017, 4:09 PM               Patient ID: Regina [name], female   DOB: 09-Oct-1940, 77 y.o.   MRN: [id number]

## 2017-12-19 NOTE — Progress Notes (Signed)
[facility] PHYSICAL MEDICINE & REHABILITATION     PROGRESS NOTE  Subjective/Complaints:  Patient seen sitting up in bed this AM.  She slept fairly overnight due to some pain.  She notes mild LE edema.  She states she has not been seen by Neurosurg.   ROS: Denies CP, SOB, nausea, vomiting, diarrhea.  Objective: Vital Signs: Blood pressure (!) 145/75, pulse 71, temperature 98.6 F (37 C), temperature source Oral, resp. rate 18, weight 73 kg, SpO2 97 %. No results found. No results for input(s): WBC, HGB, HCT, PLT in the last 72 hours. Recent Labs    12/17/17 1518  NA 135  K 4.0  CL 99  GLUCOSE 106*  BUN 18  CREATININE 0.73  CALCIUM 9.0   CBG (last 3)  Recent Labs    12/18/17 1634 12/18/17 2048 12/19/17 0625  GLUCAP 87 98 91    Wt Readings from Last 3 Encounters:  12/19/17 73 kg  12/01/17 72.8 kg  11/29/17 71.9 kg    Physical Exam:  BP (!) 145/75 (BP Location: Right Arm)   Pulse 71   Temp 98.6 F (37 C) (Oral)   Resp 18   Wt 73 kg   SpO2 97%   BMI 29.44 kg/m  Constitutional: She appears well-developed and well-nourished.  HENT: Normocephalic and atraumatic.  Eyes: EOM are normal. No discharge. Neck: c-collar in place.  Cardiovascular: RRR. No JVD.  Respiratory: Effort normal and breath sounds normal.  GI: Bowel sounds are normal. She exhibits no distension.  Musculoskeletal: LE edema, improved Neurological: She is alert and oriented.  Follows full commands Motor: Bilateral upper extremities: Shoulder flexion, elbow flexion/extension, wrist extension 4/5, hand grip 3+/5, unchanged Bilateral lower extremities: 4+/5 proximal distal, unchanged Skin: neck incision with edema and induration, stable Psychiatric: She has a normal mood and affect. Her behavior is normal.   Assessment/Plan: 1. 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 Physiatrist is providing close team supervision and 24 hour management of active medical problems listed below. Physiatrist and rehab team continue to assess barriers to discharge/monitor patient progress toward functional and medical goals.  Function:  Bathing Bathing position Bathing activity did not occur: Refused Position: Production manager parts bathed by patient: Right arm, Left arm, Chest, Abdomen, Front perineal area, Right upper leg, Left lower leg, Right lower leg, Left upper leg Body parts bathed by helper: Buttocks  Bathing assist Assist Level: Touching or steadying assistance(Pt > 75%)      Upper Body Dressing/Undressing Upper body dressing   What is the patient wearing?: Pull over shirt/dress, Orthosis Bra - Perfomed by patient: Thread/unthread right bra strap, Thread/unthread left bra strap, Hook/unhook bra (pull down sports bra)   Pull over shirt/dress - Perfomed by patient: Thread/unthread right sleeve, Thread/unthread left sleeve, Put head through opening, Pull shirt over trunk Pull over shirt/dress - Perfomed by helper: Put head through opening     Orthosis activity level: Performed by helper  Upper body assist Assist Level: Set up   Set up : To apply TLSO, cervical collar  Lower Body Dressing/Undressing Lower body dressing   What is the patient wearing?: Underwear, Pants, Shoes Underwear - Performed by patient: Thread/unthread right underwear leg, Thread/unthread left underwear leg, Pull underwear up/down   Pants- Performed by patient: Thread/unthread right pants leg, Thread/unthread left pants leg, Pull pants up/down   Non-skid slipper socks- Performed  by patient: Don/doff right sock, Don/doff left sock       Shoes - Performed by patient: Don/doff right shoe, Don/doff left shoe            Lower body assist Assist for lower body dressing: Touching or steadying assistance (Pt > 75%)       Toileting Toileting   Toileting steps completed by patient: Adjust clothing prior to toileting, Performs perineal hygiene, Adjust clothing after toileting Toileting steps completed by helper: Performs perineal hygiene Toileting Assistive Devices: Grab bar or rail  Toileting assist Assist level: Supervision or verbal cues   Transfers Chair/bed transfer   Chair/bed transfer method: Stand pivot Chair/bed transfer assist level: Touching or steadying assistance (Pt > 75%) Chair/bed transfer assistive device: Armrests     Locomotion Ambulation     Max distance: 150(limited by pain ) Assist level: Supervision or verbal cues   Wheelchair Wheelchair activity did not occur: N/A Type: Manual Max wheelchair distance: 150' Assist Level: Supervision or verbal cues  Cognition Comprehension Comprehension assist level: Understands complex 90% of the time/cues 10% of the time  Expression Expression assist level: Expresses basic needs/ideas: With no assist  Social Interaction Social Interaction assist level: Interacts appropriately with others with medication or extra time (anti-anxiety, antidepressant).  Problem Solving Problem solving assist level: Solves complex 90% of the time/cues < 10% of the time  Memory Memory assist level: Recognizes or recalls 90% of the time/requires cueing < 10% of the time    Medical Problem List and Plan: 1.  Decreased functional mobility secondary to cervical stenosis/spondylosis/myelopathy.  Status post discectomy C3-4, 4-5 and 5-6 for decompression of spinal cord and existing nerve roots 12/06/2017.  Cervical collar  Continue CIR  Await neurosurgery recs regarding neck incision induration, cont await further recs  Plan for d/c tomorrow  Will see patient for transitional care management in 1-2 weeks post-discharge 2.  DVT Prophylaxis/Anticoagulation: SCDs  Vascular study negative for DVT  3. Pain Management: Skelaxin and oxycodone as needed 4. Mood: Lexapro 10  mg nightly, Xanax 0.25 mg 3 times daily as needed 5. Neuropsych: This patient is capable of making decisions on her own behalf. 6. Skin/Wound Care: Routine skin checks 7. Fluids/Electrolytes/Nutrition: Routine in and outs  8. Hyponatremia: HCTZ held. Resolved   Sodium 135 on 9/16  Continue sodium chloride tablets, decreased to 1 mg twice a day on 9/14  Continue to monitor 9.  Bouts of syncope.  Monitor blood pressure when out of bed.  Echocardiogram with ejection fraction of 65%. 10.  Hypertension.  Hydralazine 50 mg every 8 hours  Slightly labile on 9/18  Lasix 40 mg daily 9/13-9/15  Monitor with increased mobility 11.  History of diabetes mellitus.   Patient on Glucophage 850 mg twice daily prior to admission. Check CBG ac/HS. Resume as needed  Controlled on 9/18   Monitor with increased mobility 12.  Hypothyroidism.  Synthroid 13.  Asthma/COPD.  Continue nebulizers and Singulair 14.  Constipation.  Laxative assistance 15. Hypoalbuminemia  Supplement initiated on 9/10 16. Acute blood loss anemia  Hemoglobin 10.7 on 9/13  Continue to monitor 17. Hypokalemia: Resolved  K+ 4.0 on 9/16  Supplemented x3 days 18. Leukocytosis  WBCs 10.6 on 9/13  Afebrile  Continue to monitor   LOS (Days) 9 A FACE TO FACE EVALUATION WAS PERFORMED  Ankit Lorie Phenix 12/19/2017 10:09 AM

## 2017-12-19 NOTE — Progress Notes (Signed)
Occupational Therapy Session Note  Patient Details  Name: Regina [name] MRN: [id number] Date of Birth: [birth year]  Today's Date: 12/19/2017 OT Individual Time: 8921-1941 OT Individual Time Calculation (min): 60 min   Short Term Goals: Week 1:  OT Short Term Goal 1 (Week 1): Pt will perform sit to stand transfers with supervision for self care tasks  OT Short Term Goal 2 (Week 1): Pt will perform UB dressing with supervision assist  OT Short Term Goal 3 (Week 1): Pt will perform LB dressing with supervision assist   OT Short Term Goal 4 (Week 1): Pt will retrieve dressing items needed for dressing with LRAD and close supervision   Skilled Therapeutic Interventions/Progress Updates:    Pt supine in bed upon entry reporting fatigue and "grogginess" however no reports of pain. Pt performed all ambulation and functional transfers this session with mod I-supervision from various surfaces in room with RW. Pt ambulated to toilet and performed hygiene tasks in standing with education provided on lunge positioning for hygiene due to difficulty reaching buttocks, pt able to complete hygiene thoroughly with modification. Pt performed bathing tasks seated in shower except for buttocks area in standing with same positioning as mentioned previously. Pt ambulated to w/c in room and performed dressing and grooming tasks while seated at sink except for pulling LB dressing up with mod I and UE support on RW for balance. Cervical replacement pads donned/doffed total A to adhere to cervical precautions. Pt transferred to recliner and left in room with all needs in reach, chair alarm set.   Therapy Documentation Precautions:  Precautions Precautions: Cervical, Fall Precaution Comments: Per MD orders: pt can take off brace for showering and walking to the bathroom  Required Braces or Orthoses: Cervical Brace Cervical Brace: Hard collar Restrictions Weight Bearing Restrictions: No  See Function  Navigator for Current Functional Status.   Therapy/Group: Individual Therapy  Canna Nickelson 12/19/2017, 9:31 AM

## 2017-12-19 NOTE — Discharge Summary (Signed)
Discharge summary job [id number]

## 2017-12-19 NOTE — Patient Care Conference (Signed)
Inpatient RehabilitationTeam Conference and Plan of Care Update Date: 12/19/2017   Time: 2:00 PM    Patient Name: Regina [name]      Medical Record Number: [id number]  Date of Birth: 1941-03-25 Sex: Female         Room/Bed: 4M11C/4M11C-01 Payor Info: Payor: MEDICARE / Plan: MEDICARE PART A / Product Type: *No Product type* /    Admitting Diagnosis: Cervical Myelopathy  Admit Date/Time:  12/10/2017  4:53 PM Admission Comments: No comment available   Primary Diagnosis:  <principal problem not specified> Principal Problem: <principal problem not specified>  Patient Active Problem List   Diagnosis Date Noted  . Leukocytosis   . Hypokalemia   . Labile blood pressure   . Benign essential HTN   . Postoperative pain   . Hypertensive crisis   . 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 Hypoalbuminemia due to protein-calorie malnutrition ([employer])   . Acute blood loss anemia   . Myelopathy ([place]) 12/10/2017  . HNP (herniated nucleus pulposus) with myelopathy, cervical   . Essential hypertension   . Uncomplicated asthma   . Insulin resistance   . Hashimoto's disease   . Cervical spondylolysis   . Anxiety   . Hyponatremia 12/01/2017  . Hypothyroidism 12/01/2017    Expected Discharge Date: Expected Discharge Date: 12/20/17  Team Members Present: Physician leading conference: Dr. Delice Lesch Social Worker Present: Ovidio Kin, LCSW Nurse Present: Benjie Karvonen, RN PT Present: Kem Parkinson, PT OT Present: Napoleon Form, OT SLP Present: Windell Moulding, SLP PPS Coordinator present : Daiva Nakayama, RN, CRRN     Current Status/Progress Goal Weekly Team Focus  Medical   Decreased functional mobility secondary to cervical stenosis/spondylosis/myelopathy.  Status post discectomy C3-4, 4-5 and 5-6 for decompression of spinal cord and existing nerve roots 12/06/2017.   Improve mobility, transfers, WBCs, BP  See above   Bowel/Bladder   continent of b/b LBM 9/18, mod A, up to BR w  rw, senna scheduled  continent of b/b with mod I A  monitor b/b q shift and prn   Swallow/Nutrition/ Hydration             ADL's   Mod I for all self care tasks and functional transfers except shower transfer/bathing   D/c mod I with supervision for bathing   D/c 9/19 with family available    Mobility   modI transfers, S gait with RW  modI transfers, S gait with LRAD  activity tolerance, LE strength/ROM, dynamic balance   Communication             Safety/Cognition/ Behavioral Observations            Pain   oxy 5 mg q 3 h prn for neck/shoulder, incision pain, pt takes every 3-4 hours, prn skelaxin, incison with swelling and tenderness  Pain <2  monitor pain q shift and prn   Skin   incision OTA, anterior, swelling, tender, aspen collar at all times  maintiain skin integrity while on IPR  monitor skin q shift and prn      *See Care Plan and progress notes for long and short-term goals.     Barriers to Discharge  Current Status/Progress Possible Resolutions Date Resolved   Physician    Medical stability     See above  Therapies, optimize BP meds, follow labs      Nursing                  PT  OT                  SLP                SW                Discharge Planning/Teaching Needs:  Husband here daily to obsereve in therapies. She needs to build her confidence before going home.      Team Discussion:  Reaching goals of mod/i-supervision level. Family training with husband and able to do what pt needs. Medically stable for DC tomorrow. Still waiting for surgeon to come by and l;ook at incision.   Revisions to Treatment Plan:  DC 9/19    Continued Need for Acute Rehabilitation Level of Care: The patient requires daily medical management by a physician with specialized training in physical medicine and rehabilitation for the following conditions: Daily direction of a multidisciplinary physical rehabilitation program to ensure safe treatment while  eliciting the highest outcome that is of practical value to the patient.: Yes Daily medical management of patient stability for increased activity during participation in an intensive rehabilitation regime.: Yes Daily analysis of laboratory values and/or radiology reports with any subsequent need for medication adjustment of medical intervention for : Neurological problems;Blood pressure problems;Other   I attest that I was present, lead the team conference, and concur with the assessment and plan of the team.   [name] 12/19/2017, 4:09 PM

## 2017-12-19 NOTE — Progress Notes (Signed)
Occupational Therapy Discharge Summary  Patient Details  Name: Regina [name] MRN: [id number] Date of Birth: 02-23-1941  Today's Date: 12/19/2017  Patient has met 10 of 10 long term goals due to improved activity tolerance, improved balance, ability to compensate for deficits and improved coordination.  Patient to discharge at overall Modified Independent level for ADL's however pt requires supervision for shower transfers and IADLs.  Patient's care partner is independent to provide the necessary physical assistance at discharge however pt is primary caregiver for cognitive task such as money management due to husband's CVA deficits. Pt has made great progress during LOS regarding functional transfers and self care tasks however still has difficulty with [facility] tasks requiring extra time. [name] has been present for majority of sessions and is aware of assistance level needed at d/c for shower transfers (close supervision). Pt and husband have been educated on energy conservation during self care tasks as well as sitting in supportive chair for balance, shower transfer with BSC, functional transfers and preferred level of seated surfaces with arm rests, UE positioning during [facility] tasks, and donning/doffing of cervical brace and replacement pads. Pt is safe to d/c home mod I with intermittent supervision assistance needed during shower transfers and IADLs.   Recommendation:  Patient will benefit from ongoing skilled OT services in home health setting to continue to advance functional skills in the area of BADL and iADL.  Equipment: BSC   Reasons for discharge: discharge from hospital  Patient/family agrees with progress made and goals achieved: Yes  OT Discharge Precautions/Restrictions  Precautions Precautions: Cervical Precaution Comments: Per MD orders: pt can take off brace for showering and walking to the bathroom  Required Braces or Orthoses: Cervical Brace Cervical Brace:  Hard collar Restrictions Weight Bearing Restrictions: No Vision Baseline Vision/History: No visual deficits Patient Visual Report: No change from baseline Vision Assessment?: No apparent visual deficits Perception  Perception: Within Functional Limits Praxis Praxis: Intact Cognition Overall Cognitive Status: Within Functional Limits for tasks assessed Arousal/Alertness: Awake/alert Orientation Level: Oriented X4 Attention: Alternating Selective Attention: Appears intact Alternating Attention: Appears intact Memory: Appears intact Awareness: Appears intact Problem Solving: Appears intact Safety/Judgment: Appears intact Sensation Sensation Light Touch: Appears Intact Hot/Cold: Not tested Proprioception: Intact based on observation Stereognosis: Not tested Coordination Gross Motor Movements are Fluid and Coordinated: No Fine Motor Movements are Fluid and Coordinated: No Coordination and Movement Description: Pt with difficulty manipulating items for [facility]  Motor  Motor Motor: Other (comment) Motor - Discharge Observations: decreased LE and UE coordination during functional tasks  Mobility  Bed Mobility Bed Mobility: Sit to Supine;Supine to Sit Supine to Sit: Supervision/Verbal cueing Sit to Supine: Contact Guard/Touching assist Transfers Sit to Stand: Independent with assistive device Stand to Sit: Independent with assistive device  Trunk/Postural Assessment  Cervical Assessment Cervical Assessment: Exceptions to WFL(Pt with cervical collar preventing movement) Thoracic Assessment Thoracic Assessment: Exceptions to WFL(rounded shoulders ) Lumbar Assessment Lumbar Assessment: Within Functional Limits Postural Control Postural Control: Within Functional Limits  Balance Balance Balance Assessed: Yes Standardized Balance Assessment Standardized Balance Assessment: Berg Balance Test;Timed Up and Go Test Berg Balance Test Sit to Stand: Able to stand without using hands  and stabilize independently Standing Unsupported: Able to stand safely 2 minutes Sitting with Back Unsupported but Feet Supported on Floor or Stool: Able to sit safely and securely 2 minutes Stand to Sit: Sits safely with minimal use of hands Transfers: Able to transfer safely, minor use of hands Standing Unsupported with Eyes Closed: Able  to stand 10 seconds with supervision Standing Ubsupported with Feet Together: Needs help to attain position and unable to hold for 15 seconds From Standing, Reach Forward with Outstretched Arm: Reaches forward but needs supervision From Standing Position, Pick up Object from Floor: Able to pick up shoe, needs supervision From Standing Position, Turn to Look Behind Over each Shoulder: Needs supervision when turning Turn 360 Degrees: Needs close supervision or verbal cueing Standing Unsupported, Alternately Place Feet on Step/Stool: Able to complete >2 steps/needs minimal assist Standing Unsupported, One Foot in Front: Able to take small step independently and hold 30 seconds Standing on One Leg: Unable to try or needs assist to prevent fall Total Score: 32 Timed Up and Go Test TUG: Normal TUG Normal TUG (seconds): 34(RW) Static Sitting Balance Static Sitting - Balance Support: No upper extremity supported Static Sitting - Level of Assistance: 7: Independent Dynamic Sitting Balance Dynamic Sitting - Balance Support: No upper extremity supported Dynamic Sitting - Level of Assistance: 6: Modified independent (Device/Increase time) Dynamic Sitting - Balance Activities: Reaching for objects;Forward lean/weight shifting Sitting balance - Comments: Seated EOB and in w/c pt able to perform functional activities without LOB Static Standing Balance Static Standing - Balance Support: During functional activity Static Standing - Level of Assistance: 6: Modified independent (Device/Increase time) Dynamic Standing Balance Dynamic Standing - Balance Support: During  functional activity;Right upper extremity supported Dynamic Standing - Level of Assistance: 6: Modified independent (Device/Increase time) Dynamic Standing - Balance Activities: Forward lean/weight shifting;Reaching for objects Dynamic Standing - Comments: Pt able to pull up pants standing at sink and perform self care tasks  Extremity/Trunk Assessment RUE Assessment RUE Assessment: Exceptions to [facility] Passive Range of Motion (PROM) Comments: shoulder flexion ~120 degrees Active Range of Motion (AROM) Comments: shoulder flexion ~100 degrees  General Strength Comments: Shoulder was not fully tested due to cervical restrictions; elbow flexion/extension: 4/5; pt able to brush hair and perform functional tasks  LUE Assessment LUE Assessment: Exceptions to [facility] Passive Range of Motion (PROM) Comments: shoulder flexion ~120 degrees  Active Range of Motion (AROM) Comments: shoulder flexion ~100 degrees  General Strength Comments: Shouler: not fully tested due to cervical restrictions; elbow flexion/extension: 4/5 however pt able to perform functional tasks and reach overhead   See Function Navigator for Current Functional Status.  Orlondo Holycross 12/19/2017, 2:47 PM

## 2017-12-19 NOTE — Progress Notes (Signed)
Physical Therapy Session Note  Patient Details  Name: Regina [name] MRN: [id number] Date of Birth: 06/09/1940  Today's Date: 12/19/2017 PT Individual Time: 7948-0165 PT Individual Time Calculation (min): 70 min   Short Term Goals: Week 1:  PT Short Term Goal 1 (Week 1): Pt will perform bed mobility with S from flat bed PT Short Term Goal 2 (Week 1): Pt will perform transfers with S PT Short Term Goal 3 (Week 1): Pt will perform gait with LRAD x150' min guard PT Short Term Goal 4 (Week 1): Pt will ascend/descent 12 steps with B handrails and min guard  Skilled Therapeutic Interventions/Progress Updates:    Pt seated in recliner upon PT arrival, agreeable to therapy tx and denies pain. Pt performed sit<>stand Mod I and donned jacket with supervision. Pt ambulated from room>rehab apartment with RW and supervision x 200 ft, decreased gait speed and small step length. Pt performed transfer on/off bed in rehab apartment with supervision and verbal cues for techniques. [name] observed entire session. Pt performed x 10 sit<>stands from the mat with supervision and no UE support for LE strengthening. Pt worked on dynamic standing balance on airex while performing card matching activity x 2 trials, x 1 with feet together and x 2 with feet apart. Pt worked on dynamic standing balance without UE support to perform toe taps on colored cones, x 2 trials with min assist. Pt performed sit<>stands 2 x 5 with single UE on dynadisc working on balance and strength. Pt ambulated back to room with RW and supervision x 200 ft. Pt received equipment for home, therapist ensured proper fit/size for RW. Pt left seated in recliner with needs in reach and husband present.   Therapy Documentation Precautions:  Precautions Precautions: Cervical, Fall Precaution Comments: Per MD orders: pt can take off brace for showering and walking to the bathroom  Required Braces or Orthoses: Cervical Brace Cervical  Brace: Hard collar Restrictions Weight Bearing Restrictions: No  See Function Navigator for Current Functional Status.   Therapy/Group: Individual Therapy  Netta Corrigan, PT, DPT 12/19/2017, 7:50 AM

## 2017-12-20 LAB — GLUCOSE, CAPILLARY: Glucose-Capillary: 127 mg/dL — ABNORMAL HIGH (ref 70–99)

## 2017-12-20 MED ORDER — METAXALONE 800 MG PO TABS: 800.0000 mg | ORAL_TABLET | Freq: Three times a day (TID) | ORAL | 0 refills | Status: DC | PRN

## 2017-12-20 MED ORDER — ACETAMINOPHEN 325 MG PO TABS: 325.0000 mg | ORAL_TABLET | ORAL | Status: DC | PRN

## 2017-12-20 MED ORDER — LEVOTHYROXINE SODIUM 75 MCG PO TABS: 75.0000 ug | ORAL_TABLET | Freq: Every day | ORAL | 0 refills | Status: AC

## 2017-12-20 MED ORDER — MONTELUKAST SODIUM 10 MG PO TABS: 10.0000 mg | ORAL_TABLET | Freq: Every day | ORAL | 3 refills | Status: DC

## 2017-12-20 MED ORDER — HYDRALAZINE HCL 50 MG PO TABS: 50.0000 mg | ORAL_TABLET | Freq: Three times a day (TID) | ORAL | 0 refills | Status: DC

## 2017-12-20 MED ORDER — OXYCODONE HCL 5 MG PO TABS: 5.0000 mg | ORAL_TABLET | ORAL | 0 refills | Status: DC | PRN

## 2017-12-20 MED ORDER — ALPRAZOLAM 0.25 MG PO TABS: 0.2500 mg | ORAL_TABLET | Freq: Three times a day (TID) | ORAL | 0 refills | Status: AC | PRN

## 2017-12-20 MED ORDER — ESCITALOPRAM OXALATE 10 MG PO TABS: 10.0000 mg | ORAL_TABLET | Freq: Every day | ORAL | 0 refills | Status: DC

## 2017-12-20 MED ORDER — SENNOSIDES-DOCUSATE SODIUM 8.6-50 MG PO TABS: 1.0000 | ORAL_TABLET | Freq: Two times a day (BID) | ORAL | Status: AC

## 2017-12-20 NOTE — Progress Notes (Signed)
Social Work  Discharge Note  The overall goal for the admission was met for:   Discharge location: Yes-HOME WITH HUSBAND WHO CAN PROVIDE SUPERVISION LEVEL  Length of Stay: Yes-10 DAYS  Discharge activity level: Yes-MOD/I-SUPERVISION LEVEL  Home/community participation: Yes  Services provided included: MD, RD, PT, OT, SLP, RN, CM, Pharmacy and SW  Financial Services: Medicare and Private Insurance: [place]  Follow-up services arranged: Home Health: [facility]-PT & OT, DME: [place], 3 IN 1 AND [facility] and Patient/Family has no preference for HH/DME agencies  Comments (or additional information):HUSBAND HERE DAILY AND ASSISTED WITH HER CARE. BOTH FEEL COMFORTABLE WITH HER DC TODAY.  Patient/Family verbalized understanding of follow-up arrangements: Yes  Individual responsible for coordination of the follow-up plan: SELF & [place]-HUSBAND  Confirmed correct DME delivered: [name] 12/20/2017    [name]

## 2017-12-20 NOTE — Progress Notes (Signed)
Social Work Patient ID: Regina [name], female   DOB: 1940/07/09, 77 y.o.   MRN: [id number] Pt feels ready to go home, husband has been trainned and is ready. Follow up and DME ordered.

## 2017-12-20 NOTE — Progress Notes (Signed)
Patient discharged to home per wheelchair accompanied by NT and daughter. Discharge instructions done by Linna Hoff PA no further questions noted.

## 2017-12-24 ENCOUNTER — Telehealth: Payer: Self-pay | Admitting: *Deleted

## 2017-12-24 NOTE — Telephone Encounter (Signed)
Transitional Care Call Completed, Appointment Confirmed, Address confirmed, new patient packet mailed  Transitional Care Questions   Questions for our staff to ask patients on Transitional care 48 hour phone call:   1. Are you/is patient experiencing any problems since coming home? Nothing major, feet swelling, itchy, arms weak/heavy Are there any questions regarding any aspect of care? No  2. Are there any questions regarding medications administration/dosing? No  Are meds being taken as prescribed? Yes  Patient should review meds with caller to confirm   3. Have there been any falls?  No  4. Has Home Health been to the house and/or have they contacted you? PT has been to the house, OT will call today (12/24/2017)  If not, have you tried to contact them? Can we help you contact them?   5. Are bowels and bladder emptying properly? Yes Are there any unexpected incontinence issues? No  If applicable, is patient following bowel/bladder programs?   6. Any fevers, problems with breathing, unexpected pain? No  7. Are there any skin problems or new areas of breakdown?  No, itchy  8. Has the patient/family member arranged specialty MD follow up (ie cardiology/neurology/renal/surgical/etc)?  Yes Can we help arrange?   9. Does the patient need any other services or support that we can help arrange? No   10. Are caregivers following through as expected in assisting the patient?  No  11. Has the patient quit smoking, drinking alcohol, or using drugs as recommended? No smoke since 1982, No drink,  No illicit drug use

## 2018-01-03 ENCOUNTER — Encounter: Payer: Federal, State, Local not specified - PPO | Admitting: Physical Medicine & Rehabilitation

## 2018-02-08 ENCOUNTER — Encounter

## 2018-02-08 ENCOUNTER — Ambulatory Visit: Payer: Federal, State, Local not specified - PPO | Admitting: Neurology

## 2018-03-14 ENCOUNTER — Other Ambulatory Visit: Payer: Self-pay

## 2018-03-14 ENCOUNTER — Encounter: Payer: Self-pay | Admitting: Physical Therapy

## 2018-03-14 ENCOUNTER — Ambulatory Visit: Payer: Federal, State, Local not specified - PPO | Attending: Family Medicine | Admitting: Physical Therapy

## 2018-03-14 DIAGNOSIS — G959 Disease of spinal cord, unspecified: Secondary | ICD-10-CM | POA: Diagnosis present

## 2018-03-14 DIAGNOSIS — R262 Difficulty in walking, not elsewhere classified: Secondary | ICD-10-CM | POA: Diagnosis present

## 2018-03-14 DIAGNOSIS — M5412 Radiculopathy, cervical region: Secondary | ICD-10-CM | POA: Insufficient documentation

## 2018-03-14 DIAGNOSIS — M6281 Muscle weakness (generalized): Secondary | ICD-10-CM | POA: Insufficient documentation

## 2018-03-14 NOTE — Therapy (Signed)
[facility] [address] [place], [place], [address] Phone: [phone]   Fax:  [phone]  Physical Therapy Evaluation  Patient Details  Name: Regina [name] MRN: [id number] Date of Birth: 1941-03-15 Referring Provider (PT): Dr. Maurice Small   Encounter Date: 03/14/2018  PT End of Session - 03/14/18 1556    Visit Number  1    Date for PT Re-Evaluation  05/09/18    Authorization Type  BCBS 50 visit limit    PT Start Time  1230    PT Stop Time  1318    PT Time Calculation (min)  48 min    Activity Tolerance  Patient tolerated treatment well       Past Medical History:  Diagnosis Date  . Anxiety   . Arthritis    neck  . Asthma   . Cervical spondylolysis   . Cervical spondylolysis   . Depression   . Hashimoto's disease   . Insulin resistance   . PONV (postoperative nausea and vomiting)    hasn't had surgery since 1993    Past Surgical History:  Procedure Laterality Date  . ANTERIOR CERVICAL DECOMP/DISCECTOMY FUSION N/A 12/06/2017   Procedure: ANTERIOR CERVICAL DECOMPRESSION/DISCECTOMY FUSION, CERVICAL 3- CERVICAL 4, CERVICAL 4- CERVICAL 5, CERVICAL 5- CERVICAL 6;  Surgeon: [name], MD;  Location: [facility];  Service: Neurosurgery;  Laterality: N/A;  ANTERIOR CERVICAL DECOMPRESSION/DISCECTOMY FUSION, CERVICAL THREE- CERVICAL FOUR, CERVICAL FOUR- CERVICAL FIVE, CERVICAL FIVE- CERVICAL SIX  . APPENDECTOMY    . BREAST SURGERY     left biopsy  . CESAREAN SECTION     x2  . CHOLECYSTECTOMY    . DENTAL SURGERY     teeth removal  . OVARY SURGERY     ovarian wedge  . TONSILLECTOMY      There were no vitals filed for this visit.   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  Increased diff walking and numbness in fingertips and feet, dropping things while up [place] in July/August.  Also had balance changes.  Not painful.  Saw Dr. Justin Mend.  Had surgery  on 12/06/17 for spacers in neck. Had HHPT and HHOT to walk and do things for balance.  Did putty for hands.   I can lift my arms better but my balance is still off.  I fell 2-3 x before surgery and 1x after surgery on Thanksgiving while manuevering around 81 year old grandchild.  My fingers seemed better before I fell.  Unable to stand for work or use scanner.  Hard to press buttons on remote.      Pertinent History  cervical myelopathy  ACDF C3-6 12/06/17  "[name]"    Limitations  House hold activities;Walking;Standing    How long can you walk comfortably?  with shopping cart a long time;  around the house OK 1 hour;  outside with cane more difficulty when unlevel     Diagnostic tests  none since surgery    Patient Stated Goals  not constantly worry about falling;  get balance back;  get back to work at the [employer] end of January     Currently in Pain?  Yes    Pain Score  0-No pain    Pain Location  Neck    Pain Orientation  Right;Left    Pain Type  Chronic pain    Aggravating Factors   balance with walking ;  everyday activities  [facility] PT Assessment - 03/14/18 0001      Assessment   Medical Diagnosis  ACDF multi level fusion for myelopathy    Referring Provider (PT)  Dr. Maurice Small    Onset Date/Surgical Date  --   neurosurgeon 04/18/18;  just saw MD yesterday   Hand Dominance  Right    Next MD Visit  Jan 16th neurosurgeon    Prior Therapy  Home health      Precautions   Precautions  Cervical;Fall    Precaution Comments  no lift > 25#      Restrictions   Weight Bearing Restrictions  No      Balance Screen   Has the patient fallen in the past 6 months  Yes    How many times?  4   3 before surgery   Has the patient had a decrease in activity level because of a fear of falling?   Yes    Is the patient reluctant to leave their home because of a fear of falling?   Yes      [place] residence    Living Arrangements  Spouse/significant  other    Available Help at Discharge  Family    Type of [place] to enter    Entrance Stairs-Number of Steps  1    [place]  One level    [place] - single point      Prior Function   Level of [place] with basic ADLs    Vocation  Part time employment    Leisure  walk, read; go out to eat      Observation/Other Assessments   Focus on Therapeutic Outcomes (FOTO)   37% limitation       Sensation   Light Touch  Impaired by gross assessment    Additional Comments  bil hands and feet      Posture/Postural Control   Posture/Postural Control  Postural limitations    Postural Limitations  Rounded Shoulders;Forward head      AROM   Overall AROM Comments  UE and LEs WFLs throughout;  cervical ROM not assessed secondary to recent multi level fusion      Strength   Right Hand Grip (lbs)  25    Right Hand Lateral Pinch  2 lbs    Left Hand Grip (lbs)  20    Left Hand Lateral Pinch  4 lbs    Right Hip Flexion  4/5    Right Hip Extension  4-/5    Left Hip Flexion  4/5    Left Hip Extension  4-/5    Right Knee Flexion  4/5    Right Knee Extension  4-/5    Left Knee Flexion  4/5    Left Knee Extension  4-/5    Right Ankle Dorsiflexion  4-/5    Right Ankle Plantar Flexion  4-/5    Right Ankle Eversion  4/5    Left Ankle Dorsiflexion  4-/5    Left Ankle Plantar Flexion  4-/5    Left Ankle Eversion  4/5      Bed Mobility   Supine to Sit  Minimal Assistance - Patient > 75%      Ambulation/Gait   Assistive device  Straight cane    Gait Comments  decreased gait speed, decreased heel strike      6 Minute Walk- Baseline  6 Minute Walk- Baseline  yes   to be assessed next visit     Standardized Balance Assessment   Five times sit to stand comments   25 no hands    10 Meter Walk  --   to be assessed next visit      Berg Balance Test   Sit to Stand  Able to stand without using hands and stabilize independently    Standing  Unsupported  Able to stand safely 2 minutes    Sitting with Back Unsupported but Feet Supported on Floor or Stool  Able to sit safely and securely 2 minutes    Stand to Sit  Sits safely with minimal use of hands    Transfers  Able to transfer safely, minor use of hands    Standing Unsupported with Eyes Closed  Able to stand 3 seconds    Standing Ubsupported with Feet Together  Able to place feet together independently but unable to hold for 30 seconds    From Standing, Reach Forward with Outstretched Arm  Can reach forward >5 cm safely (2")    From Standing Position, Pick up Object from Floor  Able to pick up shoe, needs supervision    From Standing Position, Turn to Look Behind Over each Shoulder  Needs supervision when turning    Turn 360 Degrees  Able to turn 360 degrees safely but slowly    Standing Unsupported, Alternately Place Feet on Step/Stool  Able to complete 4 steps without aid or supervision    Standing Unsupported, One Foot in Front  Needs help to step but can hold 15 seconds    Standing on One Leg  Unable to try or needs assist to prevent fall    Total Score  35      Timed Up and Go Test   Normal TUG (seconds)  20.9   with cane               Objective measurements completed on examination: See above findings.                PT Short Term Goals - 03/14/18 1937      PT SHORT TERM GOAL #1   Title  The patient will have improved gait speed with appropriate assistive device with TUG to 17 sec    Time  4    Period  Weeks    Status  New    Target Date  04/11/18      PT SHORT TERM GOAL #2   Title  BERG balance score improved to 40/56 indicating improved dynamic balance and decreased risk for falls    Time  4    Period  Weeks    Status  New      PT SHORT TERM GOAL #3   Title  The patient will have improved core and proximal UE strength needed to transfer from supine to sitting independently    Time  4    Period  Weeks    Status  New      PT  SHORT TERM GOAL #4   Title  Improved LE strength as evident with improved 5x sit to stand test in 20 sec or less    Time  4    Period  Weeks    Status  New        PT Long Term Goals - 03/14/18 1944      PT LONG TERM GOAL #1   Title  The patient  will be independent with HEP needed for further improvements in mobility, balance and strength    Time  8    Period  Weeks    Status  New    Target Date  05/09/18      PT LONG TERM GOAL #2   Title  The patient will have improved BERG balance test to 45/56 indicating improved gait safety and balance    Time  8    Period  Weeks    Status  New      PT LONG TERM GOAL #3   Title  The patient will have improved grip strength to 30# and pincher squeeze to 8# needed for work tasks    Time  8    Period  Weeks    Status  New      PT LONG TERM GOAL #4   Title  The patient will be able to ambulate with appropriate assistive device 500 feet on paved level surfaces without loss of balance     Time  8    Period  Weeks    Status  New      PT LONG TERM GOAL #5   Title  FOTO functional outcome score improved from 46% limitation to 39% indicating improved function with less pain     Time  8    Period  Weeks    Status  New             Plan - 03/14/18 1552    Clinical Impression Statement  The patient is a very pleasant 77 year old who works as a Market researcher at the [employer].  She began to have numbness and tingling in her hands and feet, dropping things and having balance issues in July and August.  She fell 2-3x.  It was determined she had cervical myelopathy and she underwent anterior cervical fusion of C3-6 on 12/06/17.  She had 10 days of inpatient rehab before she was discharged home with HHPT and [place] which she reports focused on walking and balance.  She reports she had a fall on Thanksgiving and feels her numbness in her hands has worsened since the fall.  I recommended that she follow up with her surgeon regarding this change  in status.   She is currently using a SPC which she uses primarily in the community only but not at home.  BERG balance test score is 35/56 which indicates she is at high risk for falls.  Discussed results with patient and recommendation for full time use of assistive device for safety.  Decreased overall gait speed with Timed up and go 20.9 sec.  She is able to rise from a chair without UE assist however she needs min assist to sit up from the supine position.   Strength is grossly 4-/5 except decreased grip and pincher strength.  Patient is particularly concerned about this with returning to work in January.  She uses a scanner and she feels she lacks to the strength in her hand to use it.  The patient would benefit from PT to address these deficits in order to decrease risk for falls, be a caregiver to her husband and return to work.      History and Personal Factors relevant to plan of care:  lack of home support (patient is primary caregiver of husband who has cognitive deficits); numerous comorbidities including HTN, diabetes, anxiety    Clinical Presentation  Evolving    Clinical Presentation due to:  multiple  body regions and systems affected; changing sensory and motor function    Clinical Decision Making  Moderate    Rehab Potential  Good    Clinical Impairments Affecting Rehab Potential  Multi-level cervical fusion 12/06/17    PT Frequency  2x / week    PT Duration  8 weeks    PT Treatment/Interventions  ADLs/Self Care Home Management;Electrical Stimulation;Cryotherapy;Moist Heat;Therapeutic exercise;Therapeutic activities;Neuromuscular re-education;Patient/family education;Manual techniques;Balance training    PT Next Visit Plan  Check 76m walk;  Do 6 min walk test;  start HEP;   intrinsic hand strengthening;  balance;  LE strength    Consulted and Agree with Plan of Care  Patient       Patient will benefit from skilled therapeutic intervention in order to improve the following deficits and  impairments:  Abnormal gait, Pain, Decreased activity tolerance, Decreased range of motion, Decreased strength, Impaired perceived functional ability, Difficulty walking, Decreased balance  Visit Diagnosis: Muscle weakness (generalized) - Plan: PT plan of care cert/re-cert  Difficulty in walking, not elsewhere classified - Plan: PT plan of care cert/re-cert  Radiculopathy, cervical region - Plan: PT plan of care cert/re-cert     Problem List Patient Active Problem List   Diagnosis Date Noted  . Leukocytosis   . Hypokalemia   . Labile blood pressure   . Benign essential HTN   . Postoperative pain   . Hypertensive crisis   . 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 Hypoalbuminemia due to protein-calorie malnutrition ([place])   . Acute blood loss anemia   . Myelopathy ([place]) 12/10/2017  . HNP (herniated nucleus pulposus) with myelopathy, cervical   . Essential hypertension   . Uncomplicated asthma   . Insulin resistance   . Hashimoto's disease   . Cervical spondylolysis   . Anxiety   . Hyponatremia 12/01/2017  . Hypothyroidism 12/01/2017   Ruben Im, PT 03/14/18 7:54 PM Phone: [phone] Fax: [phone]  Alvera Singh 03/14/2018, 7:53 PM  [facility] [address] [place], [place], [address] Phone: [phone]   Fax:  [phone]  Name: Regina [name] MRN: [id number] Date of Birth: September 27, 1940

## 2018-03-25 ENCOUNTER — Encounter: Payer: Self-pay | Admitting: Physical Therapy

## 2018-03-25 ENCOUNTER — Ambulatory Visit: Payer: Federal, State, Local not specified - PPO | Admitting: Physical Therapy

## 2018-03-25 DIAGNOSIS — G959 Disease of spinal cord, unspecified: Secondary | ICD-10-CM

## 2018-03-25 DIAGNOSIS — M6281 Muscle weakness (generalized): Secondary | ICD-10-CM

## 2018-03-25 DIAGNOSIS — M5412 Radiculopathy, cervical region: Secondary | ICD-10-CM

## 2018-03-25 DIAGNOSIS — R262 Difficulty in walking, not elsewhere classified: Secondary | ICD-10-CM

## 2018-03-25 NOTE — Therapy (Signed)
[facility] [address], [place], [place], [address] Phone: [phone]   Fax:  [phone]  Physical Therapy Treatment  Patient Details  Name: Regina [name] MRN: [id number] Date of Birth: May 21, 1940 Referring Provider (PT): Dr. Maurice Small   Encounter Date: 03/25/2018  PT End of Session - 03/25/18 1150    Visit Number  2    Date for PT Re-Evaluation  05/09/18    Authorization Type  BCBS 50 visit limit    PT Start Time  1145    PT Stop Time  1225    PT Time Calculation (min)  40 min    Activity Tolerance  Patient tolerated treatment well    Behavior During Therapy  [facility] for tasks assessed/performed       Past Medical History:  Diagnosis Date  . Anxiety   . Arthritis    neck  . Asthma   . Cervical spondylolysis   . Cervical spondylolysis   . Depression   . Hashimoto's disease   . Insulin resistance   . PONV (postoperative nausea and vomiting)    hasn't had surgery since 1993    Past Surgical History:  Procedure Laterality Date  . ANTERIOR CERVICAL DECOMP/DISCECTOMY FUSION N/A 12/06/2017   Procedure: ANTERIOR CERVICAL DECOMPRESSION/DISCECTOMY FUSION, CERVICAL 3- CERVICAL 4, CERVICAL 4- CERVICAL 5, CERVICAL 5- CERVICAL 6;  Surgeon: [name], MD;  Location: [facility];  Service: Neurosurgery;  Laterality: N/A;  ANTERIOR CERVICAL DECOMPRESSION/DISCECTOMY FUSION, CERVICAL THREE- CERVICAL FOUR, CERVICAL FOUR- CERVICAL FIVE, CERVICAL FIVE- CERVICAL SIX  . APPENDECTOMY    . BREAST SURGERY     left biopsy  . CESAREAN SECTION     x2  . CHOLECYSTECTOMY    . DENTAL SURGERY     teeth removal  . OVARY SURGERY     ovarian wedge  . TONSILLECTOMY      There were no vitals filed for this visit.  Subjective Assessment - 03/25/18 1151    Subjective  No new complaints this morning. She feel sshe is regressing not being in therapy.     Pertinent History  cervical myelopathy  ACDF C3-6 12/06/17  "[name]"     Currently in Pain?  Yes    Pain Score  3     Pain Location  --   Mild Rt neck and shoudler pain   Pain Orientation  Right    Pain Descriptors / Indicators  Aching;Tightness    Multiple Pain Sites  No                       [facility] Adult PT Treatment/Exercise - 03/25/18 0001      Lumbar Exercises: Aerobic   Stationary Bike  L1 x 5 min with RPMS >30 throughout      Lumbar Exercises: Seated   Other Seated Lumbar Exercises  Red band seated clamshell 20x      Hand Exercises   Digiticizer  2 min             PT Education - 03/25/18 1206    Education Details  HEP    Person(s) Educated  Patient    Methods  Explanation;Demonstration;Verbal cues;Handout    Comprehension  Returned demonstration;Verbalized understanding       PT Short Term Goals - 03/14/18 1937      PT SHORT TERM GOAL #1   Title  The patient will have improved gait speed with appropriate assistive device with TUG to 17 sec  Time  4    Period  Weeks    Status  New    Target Date  04/11/18      PT SHORT TERM GOAL #2   Title  BERG balance score improved to 40/56 indicating improved dynamic balance and decreased risk for falls    Time  4    Period  Weeks    Status  New      PT SHORT TERM GOAL #3   Title  The patient will have improved core and proximal UE strength needed to transfer from supine to sitting independently    Time  4    Period  Weeks    Status  New      PT SHORT TERM GOAL #4   Title  Improved LE strength as evident with improved 5x sit to stand test in 20 sec or less    Time  4    Period  Weeks    Status  New        PT Long Term Goals - 03/14/18 1944      PT LONG TERM GOAL #1   Title  The patient will be independent with HEP needed for further improvements in mobility, balance and strength    Time  8    Period  Weeks    Status  New    Target Date  05/09/18      PT LONG TERM GOAL #2   Title  The patient will have improved BERG balance test to 45/56 indicating  improved gait safety and balance    Time  8    Period  Weeks    Status  New      PT LONG TERM GOAL #3   Title  The patient will have improved grip strength to 30# and pincher squeeze to 8# needed for work tasks    Time  8    Period  Weeks    Status  New      PT LONG TERM GOAL #4   Title  The patient will be able to ambulate with appropriate assistive device 500 feet on paved level surfaces without loss of balance     Time  8    Period  Weeks    Status  New      PT LONG TERM GOAL #5   Title  FOTO functional outcome score improved from 46% limitation to 39% indicating improved function with less pain     Time  8    Period  Weeks    Status  New            Plan - 03/25/18 1206    Clinical Impression Statement  Pt with some mild Rt neck soreness and tightness. Most of todays treatment focused on establishing her HEP. The HEP consisted of seaated and standing LE strength and balance. Pt was able to work with the red gripper for 2 min before fatigue. PTA suggested pt put small objects in rice or macaroni at home to continue working on her finger strength at home. Pt knew what I was referring to from her previous OT sessions.      Rehab Potential  Good    Clinical Impairments Affecting Rehab Potential  Multi-level cervical fusion 12/06/17    PT Frequency  2x / week    PT Duration  8 weeks    PT Treatment/Interventions  ADLs/Self Care Home Management;Electrical Stimulation;Cryotherapy;Moist Heat;Therapeutic exercise;Therapeutic activities;Neuromuscular re-education;Patient/family education;Manual techniques;Balance training    PT Next Visit  Plan  review HEP, 6 min walk test    Recommended Other Services  [email or web address]     Consulted and Agree with Plan of Care  Patient       Patient will benefit from skilled therapeutic intervention in order to improve the following deficits and impairments:  Abnormal gait, Pain, Decreased activity tolerance, Decreased range of motion, Decreased strength,  Impaired perceived functional ability, Difficulty walking, Decreased balance  Visit Diagnosis: Muscle weakness (generalized)  Difficulty in walking, not elsewhere classified  Radiculopathy, cervical region  Myelopathy [facility])     Problem List Patient Active Problem List   Diagnosis Date Noted  . Leukocytosis   . Hypokalemia   . Labile blood pressure   . Benign essential HTN   . Postoperative pain   . Hypertensive crisis   . 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 Hypoalbuminemia due to protein-calorie malnutrition ([place])   . Acute blood loss anemia   . Myelopathy ([place]) 12/10/2017  . HNP (herniated nucleus pulposus) with myelopathy, cervical   . Essential hypertension   . Uncomplicated asthma   . Insulin resistance   . Hashimoto's disease   . Cervical spondylolysis   . Anxiety   . Hyponatremia 12/01/2017  . Hypothyroidism 12/01/2017    Regina [name] 03/25/2018, 12:19 PM  [facility] [address], [place], [place], [address] Phone: [phone]   Fax:  [phone]  Name: Regina [name] MRN: [id number] Date of Birth: 1940/08/13  Access Code: [email or web address]  URL: [email or web address]  Date: 03/25/2018  Prepared by: Myrene Galas   Exercises  Seated Heel Raise - 10 reps - 1 sets - 2 hold - 3x daily - 7x weekly  Seated Toe Raise - 10 reps - 1 sets - 2 hold - 3x daily - 7x weekly  Seated Long Arc Quad - 10 reps - 1 sets - 5 hold - 3x daily - 7x weekly  Heel rises with counter support - 10 reps - 2 sets - 2 hold - 3x daily - 7x weekly  Standing Hip Abduction with Counter Support - 10 reps - 1 sets - 3x daily - 7x weekly

## 2018-04-01 ENCOUNTER — Encounter: Payer: Self-pay | Admitting: Physical Therapy

## 2018-04-01 ENCOUNTER — Ambulatory Visit: Payer: Federal, State, Local not specified - PPO | Admitting: Physical Therapy

## 2018-04-01 DIAGNOSIS — M6281 Muscle weakness (generalized): Secondary | ICD-10-CM

## 2018-04-01 DIAGNOSIS — M5412 Radiculopathy, cervical region: Secondary | ICD-10-CM

## 2018-04-01 DIAGNOSIS — R262 Difficulty in walking, not elsewhere classified: Secondary | ICD-10-CM

## 2018-04-01 NOTE — Therapy (Signed)
[facility] [address], [place], [place], [address] Phone: [phone]   Fax:  [phone]  Physical Therapy Treatment  Patient Details  Name: Regina [name] MRN: [id number] Date of Birth: Apr 20, 1940 Referring Provider (PT): Dr. Maurice Small   Encounter Date: 04/01/2018  PT End of Session - 04/01/18 1435    Visit Number  3    Date for PT Re-Evaluation  05/09/18    Authorization Type  BCBS 50 visit limit    PT Start Time  1400    PT Stop Time  1445    PT Time Calculation (min)  45 min    Activity Tolerance  Patient tolerated treatment well;No increased pain    Behavior During Therapy  WFL for tasks assessed/performed       Past Medical History:  Diagnosis Date  . Anxiety   . Arthritis    neck  . Asthma   . Cervical spondylolysis   . Cervical spondylolysis   . Depression   . Hashimoto's disease   . Insulin resistance   . PONV (postoperative nausea and vomiting)    hasn't had surgery since 1993    Past Surgical History:  Procedure Laterality Date  . ANTERIOR CERVICAL DECOMP/DISCECTOMY FUSION N/A 12/06/2017   Procedure: ANTERIOR CERVICAL DECOMPRESSION/DISCECTOMY FUSION, CERVICAL 3- CERVICAL 4, CERVICAL 4- CERVICAL 5, CERVICAL 5- CERVICAL 6;  Surgeon: [name], MD;  Location: [place];  Service: Neurosurgery;  Laterality: N/A;  ANTERIOR CERVICAL DECOMPRESSION/DISCECTOMY FUSION, CERVICAL THREE- CERVICAL FOUR, CERVICAL FOUR- CERVICAL FIVE, CERVICAL FIVE- CERVICAL SIX  . APPENDECTOMY    . BREAST SURGERY     left biopsy  . CESAREAN SECTION     x2  . CHOLECYSTECTOMY    . DENTAL SURGERY     teeth removal  . OVARY SURGERY     ovarian wedge  . TONSILLECTOMY      There were no vitals filed for this visit.  Subjective Assessment - 04/01/18 1403    Subjective  Pt states that things are going well. She has been working on her HEP some at home.     Pertinent History  cervical myelopathy  ACDF  C3-6 12/06/17  "[name]"    Currently in Pain?  Other (Comment)   no pain noted               [facility] Adult PT Treatment/Exercise - 04/01/18 0001      Neck Exercises: Seated   Other Seated Exercise  BUE rows with yellow TB x15 reps       Lumbar Exercises: Aerobic   Nustep  L1 x5 min, PT present to discuss progress       Lumbar Exercises: Standing   Heel Raises  20 reps    Heel Raises Limitations  active DF with trunk support x15 reps (increased difficulty on Rt)     Other Standing Lumbar Exercises  alternating Lt and Rt marching with 3# ankle weights x10 reps each      Lumbar Exercises: Seated   Other Seated Lumbar Exercises  red TB clamshell x20 reps, x15 reps green TB       Hand Exercises   Other Hand Exercises  B pronation/supination with 2# dumbbells x12 reps     Other Hand Exercises  B wrist flexion and extension with forearm supported x15 reps using 2# dumbbells              PT Education - 04/01/18 1434    Education Details  technique with therex; updated HEP    Person(s) Educated  Patient    Methods  Explanation;Verbal cues;Handout;Demonstration    Comprehension  Returned demonstration;Verbalized understanding       PT Short Term Goals - 03/14/18 1937      PT SHORT TERM GOAL #1   Title  The patient will have improved gait speed with appropriate assistive device with TUG to 17 sec    Time  4    Period  Weeks    Status  New    Target Date  04/11/18      PT SHORT TERM GOAL #2   Title  BERG balance score improved to 40/56 indicating improved dynamic balance and decreased risk for falls    Time  4    Period  Weeks    Status  New      PT SHORT TERM GOAL #3   Title  The patient will have improved core and proximal UE strength needed to transfer from supine to sitting independently    Time  4    Period  Weeks    Status  New      PT SHORT TERM GOAL #4   Title  Improved LE strength as evident with improved 5x sit to stand test in 20 sec or less    Time   4    Period  Weeks    Status  New        PT Long Term Goals - 03/14/18 1944      PT LONG TERM GOAL #1   Title  The patient will be independent with HEP needed for further improvements in mobility, balance and strength    Time  8    Period  Weeks    Status  New    Target Date  05/09/18      PT LONG TERM GOAL #2   Title  The patient will have improved BERG balance test to 45/56 indicating improved gait safety and balance    Time  8    Period  Weeks    Status  New      PT LONG TERM GOAL #3   Title  The patient will have improved grip strength to 30# and pincher squeeze to 8# needed for work tasks    Time  8    Period  Weeks    Status  New      PT LONG TERM GOAL #4   Title  The patient will be able to ambulate with appropriate assistive device 500 feet on paved level surfaces without loss of balance     Time  8    Period  Weeks    Status  New      PT LONG TERM GOAL #5   Title  FOTO functional outcome score improved from 46% limitation to 39% indicating improved function with less pain     Time  8    Period  Weeks    Status  New            Plan - 04/01/18 1436    Clinical Impression Statement  Today's session focused on therex to promote LE and UE strength. Pt had difficulty with Lt forearm exercises and needed some adjustments with this to ensure she was using proper form. Pt's HEP was updated to incorporate more UE strength and she had good understanding end of session. She has no issues with exercise resistance progressions and plans to purchase ankle weights to incorporate into her  HEP moving forward.     Rehab Potential  Good    Clinical Impairments Affecting Rehab Potential  Multi-level cervical fusion 12/06/17    PT Frequency  2x / week    PT Duration  8 weeks    PT Treatment/Interventions  ADLs/Self Care Home Management;Electrical Stimulation;Cryotherapy;Moist Heat;Therapeutic exercise;Therapeutic activities;Neuromuscular re-education;Patient/family  education;Manual techniques;Balance training    PT Next Visit Plan  6 min walk test; f/u on weigth purchase; progress UE/LE strength, introduce trunk strength    PT Home Exercise Plan  [email or web address]    Consulted and Agree with Plan of Care  Patient       Patient will benefit from skilled therapeutic intervention in order to improve the following deficits and impairments:  Abnormal gait, Pain, Decreased activity tolerance, Decreased range of motion, Decreased strength, Impaired perceived functional ability, Difficulty walking, Decreased balance  Visit Diagnosis: Muscle weakness (generalized)  Difficulty in walking, not elsewhere classified  Radiculopathy, cervical region     Problem List Patient Active Problem List   Diagnosis Date Noted  . Leukocytosis   . Hypokalemia   . Labile blood pressure   . Benign essential HTN   . Postoperative pain   . Hypertensive crisis   . 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 Hypoalbuminemia due to protein-calorie malnutrition ([place])   . Acute blood loss anemia   . Myelopathy ([place]) 12/10/2017  . HNP (herniated nucleus pulposus) with myelopathy, cervical   . Essential hypertension   . Uncomplicated asthma   . Insulin resistance   . Hashimoto's disease   . Cervical spondylolysis   . Anxiety   . Hyponatremia 12/01/2017  . Hypothyroidism 12/01/2017     3:35 PM,04/01/18 Sherol Dade PT, DPT [facility] [address], [place], [place], [address] Phone: [phone]   Fax:  [phone]  Name: Regina [name] MRN: [id number] Date of Birth: 09-10-1940

## 2018-04-01 NOTE — Patient Instructions (Signed)
Access Code: [email or web address]  URL: [email or web address]  Date: 04/01/2018  Prepared by: Sherol Dade   Exercises  Seated Heel Raise - 10 reps - 1 sets - 2 hold - 3x daily - 7x weekly  Seated Toe Raise - 10 reps - 1 sets - 2 hold - 3x daily - 7x weekly  Seated Long Arc Quad - 10 reps - 1 sets - 5 hold - 3x daily - 7x weekly  Heel rises with counter support - 10 reps - 2 sets - 2 hold - 3x daily - 7x weekly  Standing Hip Abduction with Counter Support - 10 reps - 1 sets - 3x daily - 7x weekly  Forearm Pronation with Dumbbell - 10 reps - 3 sets - 1x daily - 7x weekly  Seated Wrist Supination Pronation with Can - 10 reps - 3 sets - 1x daily - 7x weekly  Wrist Extension with Dumbbell - 10 reps - 3 sets - 1x daily - 7x weekly    [facility] [address] [place], [place] [address] Phone # [phone] Fax [phone]

## 2018-04-04 ENCOUNTER — Ambulatory Visit: Payer: Federal, State, Local not specified - PPO | Attending: Family Medicine | Admitting: Physical Therapy

## 2018-04-04 ENCOUNTER — Encounter: Payer: Self-pay | Admitting: Physical Therapy

## 2018-04-04 DIAGNOSIS — R262 Difficulty in walking, not elsewhere classified: Secondary | ICD-10-CM | POA: Diagnosis present

## 2018-04-04 DIAGNOSIS — M5412 Radiculopathy, cervical region: Secondary | ICD-10-CM | POA: Diagnosis present

## 2018-04-04 DIAGNOSIS — M6281 Muscle weakness (generalized): Secondary | ICD-10-CM | POA: Insufficient documentation

## 2018-04-04 NOTE — Therapy (Signed)
[facility] [address], [place], [place], [address] Phone: [phone]   Fax:  [phone]  Physical Therapy Treatment  Patient Details  Name: Regina [name] MRN: [id number] Date of Birth: 1941-03-30 Referring Provider (PT): Dr. Maurice Small   Encounter Date: 04/04/2018    Past Medical History:  Diagnosis Date  . Anxiety   . Arthritis    neck  . Asthma   . Cervical spondylolysis   . Cervical spondylolysis   . Depression   . Hashimoto's disease   . Insulin resistance   . PONV (postoperative nausea and vomiting)    hasn't had surgery since 1993    Past Surgical History:  Procedure Laterality Date  . ANTERIOR CERVICAL DECOMP/DISCECTOMY FUSION N/A 12/06/2017   Procedure: ANTERIOR CERVICAL DECOMPRESSION/DISCECTOMY FUSION, CERVICAL 3- CERVICAL 4, CERVICAL 4- CERVICAL 5, CERVICAL 5- CERVICAL 6;  Surgeon: [name], MD;  Location: [facility];  Service: Neurosurgery;  Laterality: N/A;  ANTERIOR CERVICAL DECOMPRESSION/DISCECTOMY FUSION, CERVICAL THREE- CERVICAL FOUR, CERVICAL FOUR- CERVICAL FIVE, CERVICAL FIVE- CERVICAL SIX  . APPENDECTOMY    . BREAST SURGERY     left biopsy  . CESAREAN SECTION     x2  . CHOLECYSTECTOMY    . DENTAL SURGERY     teeth removal  . OVARY SURGERY     ovarian wedge  . TONSILLECTOMY      There were no vitals filed for this visit.  Subjective Assessment - 04/04/18 1151    Subjective  Pt states she got her dumbbells and she has been trying to complete her HEP with 2#.    Pertinent History  cervical myelopathy  ACDF C3-6 12/06/17  "[name]"    Currently in Pain?  No/denies                       [facility] Adult PT Treatment/Exercise - 04/04/18 0001      Exercises   Exercises  Wrist;Hand      Neck Exercises: Seated   Cervical Isometrics  Right lateral flexion;Left lateral flexion;Right rotation;Left rotation;Flexion;Extension;5 secs;10 reps      Lumbar  Exercises: Aerobic   Nustep  L2 x5 min, PT present to discuss progress       Lumbar Exercises: Seated   Long Arc Quad on Chair  Strengthening;Both;2 sets;10 reps    LAQ on Chair Weights (lbs)  5    Other Seated Lumbar Exercises  2x10 reps hip flexion seated in chair      Wrist Exercises   Other wrist exercises  velcro board flexion and extension x4 reps              PT Education - 04/04/18 1400    Education Details  updated HEP    Person(s) Educated  Patient    Methods  Explanation;Handout    Comprehension  Verbalized understanding;Returned demonstration       PT Short Term Goals - 03/14/18 1937      PT SHORT TERM GOAL #1   Title  The patient will have improved gait speed with appropriate assistive device with TUG to 17 sec    Time  4    Period  Weeks    Status  New    Target Date  04/11/18      PT SHORT TERM GOAL #2   Title  BERG balance score improved to 40/56 indicating improved dynamic balance and decreased risk for falls    Time  4    Period  Weeks    Status  New      PT SHORT TERM GOAL #3   Title  The patient will have improved core and proximal UE strength needed to transfer from supine to sitting independently    Time  4    Period  Weeks    Status  New      PT SHORT TERM GOAL #4   Title  Improved LE strength as evident with improved 5x sit to stand test in 20 sec or less    Time  4    Period  Weeks    Status  New        PT Long Term Goals - 03/14/18 1944      PT LONG TERM GOAL #1   Title  The patient will be independent with HEP needed for further improvements in mobility, balance and strength    Time  8    Period  Weeks    Status  New    Target Date  05/09/18      PT LONG TERM GOAL #2   Title  The patient will have improved BERG balance test to 45/56 indicating improved gait safety and balance    Time  8    Period  Weeks    Status  New      PT LONG TERM GOAL #3   Title  The patient will have improved grip strength to 30# and pincher  squeeze to 8# needed for work tasks    Time  8    Period  Weeks    Status  New      PT LONG TERM GOAL #4   Title  The patient will be able to ambulate with appropriate assistive device 500 feet on paved level surfaces without loss of balance     Time  8    Period  Weeks    Status  New      PT LONG TERM GOAL #5   Title  FOTO functional outcome score improved from 46% limitation to 39% indicating improved function with less pain     Time  8    Period  Weeks    Status  New            Plan - 04/04/18 1219    Clinical Impression Statement  Pt continues to complete HEP regularly. She was able to purchase ankle weights for home and therapist updated her HEP to allow her to use these more on her own. Pt had difficulty with the velcro board when completing wrist extension primarily. Introduced cervical isometrics to begin increasing neck strength as well. Ended with pt understanding and demonstration of all HEP updates. Will continue with current POC.     Rehab Potential  Good    Clinical Impairments Affecting Rehab Potential  Multi-level cervical fusion 12/06/17    PT Frequency  2x / week    PT Duration  8 weeks    PT Treatment/Interventions  ADLs/Self Care Home Management;Electrical Stimulation;Cryotherapy;Moist Heat;Therapeutic exercise;Therapeutic activities;Neuromuscular re-education;Patient/family education;Manual techniques;Balance training    PT Next Visit Plan  balance activity as this is importance for patient; progress UE/LE strength, wrist pronation/supination strength     PT Home Exercise Plan  [email or web address]    Consulted and Agree with Plan of Care  Patient       Patient will benefit from skilled therapeutic intervention in order to improve the following deficits and impairments:  Abnormal gait, Pain, Decreased activity tolerance, Decreased range of motion, Decreased  strength, Impaired perceived functional ability, Difficulty walking, Decreased balance  Visit Diagnosis: Muscle  weakness (generalized)  Difficulty in walking, not elsewhere classified  Radiculopathy, cervical region     Problem List Patient Active Problem List   Diagnosis Date Noted  . Leukocytosis   . Hypokalemia   . Labile blood pressure   . Benign essential HTN   . Postoperative pain   . Hypertensive crisis   . 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 Hypoalbuminemia due to protein-calorie malnutrition ([place])   . Acute blood loss anemia   . Myelopathy ([place]) 12/10/2017  . HNP (herniated nucleus pulposus) with myelopathy, cervical   . Essential hypertension   . Uncomplicated asthma   . Insulin resistance   . Hashimoto's disease   . Cervical spondylolysis   . Anxiety   . Hyponatremia 12/01/2017  . Hypothyroidism 12/01/2017     2:01 PM,04/04/18 Sherol Dade PT, DPT [place] [facility] [address] [place], [place], [address] Phone: [phone]   Fax:  [phone]  Name: Regina [name] MRN: [id number] Date of Birth: 08/30/1940

## 2018-04-09 ENCOUNTER — Encounter: Payer: Federal, State, Local not specified - PPO | Admitting: Physical Therapy

## 2018-04-11 ENCOUNTER — Encounter: Payer: Self-pay | Admitting: Physical Therapy

## 2018-04-11 ENCOUNTER — Ambulatory Visit: Payer: Federal, State, Local not specified - PPO | Admitting: Physical Therapy

## 2018-04-11 DIAGNOSIS — R262 Difficulty in walking, not elsewhere classified: Secondary | ICD-10-CM

## 2018-04-11 DIAGNOSIS — M5412 Radiculopathy, cervical region: Secondary | ICD-10-CM

## 2018-04-11 DIAGNOSIS — M6281 Muscle weakness (generalized): Secondary | ICD-10-CM | POA: Diagnosis not present

## 2018-04-11 NOTE — Therapy (Signed)
[facility] [address], [place], [place], [address] Phone: [phone]   Fax:  [phone]  Physical Therapy Treatment  Patient Details  Name: Regina [name] MRN: [id number] Date of Birth: 1941-03-08 Referring Provider (PT): Dr. Maurice Small   Encounter Date: 04/11/2018  PT End of Session - 04/11/18 1725    Visit Number  5    Date for PT Re-Evaluation  05/09/18    Authorization Type  BCBS 50 visit limit    PT Start Time  1230    PT Stop Time  1315    PT Time Calculation (min)  45 min    Activity Tolerance  Patient tolerated treatment well       Past Medical History:  Diagnosis Date  . Anxiety   . Arthritis    neck  . Asthma   . Cervical spondylolysis   . Cervical spondylolysis   . Depression   . Hashimoto's disease   . Insulin resistance   . PONV (postoperative nausea and vomiting)    hasn't had surgery since 1993    Past Surgical History:  Procedure Laterality Date  . ANTERIOR CERVICAL DECOMP/DISCECTOMY FUSION N/A 12/06/2017   Procedure: ANTERIOR CERVICAL DECOMPRESSION/DISCECTOMY FUSION, CERVICAL 3- CERVICAL 4, CERVICAL 4- CERVICAL 5, CERVICAL 5- CERVICAL 6;  Surgeon: [name], MD;  Location: [facility];  Service: Neurosurgery;  Laterality: N/A;  ANTERIOR CERVICAL DECOMPRESSION/DISCECTOMY FUSION, CERVICAL THREE- CERVICAL FOUR, CERVICAL FOUR- CERVICAL FIVE, CERVICAL FIVE- CERVICAL SIX  . APPENDECTOMY    . BREAST SURGERY     left biopsy  . CESAREAN SECTION     x2  . CHOLECYSTECTOMY    . DENTAL SURGERY     teeth removal  . OVARY SURGERY     ovarian wedge  . TONSILLECTOMY      There were no vitals filed for this visit.  Subjective Assessment - 04/11/18 1231    Subjective  I overdid it with the leg raises yesterday. I've been using 5# dumbells.  I still drop things but not as often.  They think I could have torn my rotator cuff when I fell.      Pertinent History  cervical myelopathy   ACDF C3-6 12/06/17  "[name]"    Currently in Pain?  Yes    Pain Score  2     Pain Location  Back    Pain Orientation  Lower         [facility] PT Assessment - 04/11/18 0001      Berg Balance Test   Sit to Stand  Able to stand without using hands and stabilize independently    Standing Unsupported  Able to stand safely 2 minutes    Sitting with Back Unsupported but Feet Supported on Floor or Stool  Able to sit safely and securely 2 minutes    Stand to Sit  Sits safely with minimal use of hands    Transfers  Able to transfer safely, minor use of hands    Standing Unsupported with Eyes Closed  Able to stand 10 seconds with supervision    Standing Ubsupported with Feet Together  Able to place feet together independently and stand for 1 minute with supervision    From Standing, Reach Forward with Outstretched Arm  Can reach forward >12 cm safely (5")    From Standing Position, Pick up Object from [place] to pick up shoe, needs supervision    From Standing Position, Turn to Look Behind Over each  Shoulder  Turn sideways only but maintains balance    Turn 360 Degrees  Able to turn 360 degrees safely but slowly    Standing Unsupported, Alternately Place Feet on Step/Stool  Able to complete 4 steps without aid or supervision    Standing Unsupported, One Foot in Front  Able to take small step independently and hold 30 seconds    Standing on One Leg  Tries to lift leg/unable to hold 3 seconds but remains standing independently    Total Score  41                   [facility] Adult PT Treatment/Exercise - 04/11/18 0001      Therapeutic Activites    Therapeutic Activities  ADL's;Work Simulation    Work Simulation  standing weight shifting and using UEs in standing to simulate her work at the [employer] Re-ed    Neuro Re-ed Details   step and reach with UE 5x each side;  box step 4 ways each direction 5x        Knee/Hip Exercises: Aerobic   Nustep  L2 6 min       Knee/Hip  Exercises: Standing   Forward Step Up  Right;Left;5 reps;Hand Hold: 2;Step Height: 6"    Other Standing Knee Exercises  toe raises 10x      Shoulder Exercises: Standing   Extension  Strengthening;Right;Left;Both;20 reps;Theraband    Theraband Level (Shoulder Extension)  Level 1 (Yellow)    Row  Strengthening;Right;Left;Both;20 reps;Theraband    Theraband Level (Shoulder Row)  Level 1 (Yellow)       Band ex performed bilaterally and unilaterally to simulate work tasks.          PT Short Term Goals - 04/11/18 1249      PT SHORT TERM GOAL #1   Title  The patient will have improved gait speed with appropriate assistive device with TUG to 17 sec    Time  4    Period  Weeks    Status  On-going      PT SHORT TERM GOAL #2   Title  BERG balance score improved to 40/56 indicating improved dynamic balance and decreased risk for falls    Status  Achieved      PT SHORT TERM GOAL #3   Title  The patient will have improved core and proximal UE strength needed to transfer from supine to sitting independently    Status  Achieved      PT SHORT TERM GOAL #4   Title  Improved LE strength as evident with improved 5x sit to stand test in 20 sec or less    Time  4    Period  Weeks    Status  On-going        PT Long Term Goals - 03/14/18 1944      PT LONG TERM GOAL #1   Title  The patient will be independent with HEP needed for further improvements in mobility, balance and strength    Time  8    Period  Weeks    Status  New    Target Date  05/09/18      PT LONG TERM GOAL #2   Title  The patient will have improved BERG balance test to 45/56 indicating improved gait safety and balance    Time  8    Period  Weeks    Status  New  PT LONG TERM GOAL #3   Title  The patient will have improved grip strength to 30# and pincher squeeze to 8# needed for work tasks    Time  8    Period  Weeks    Status  New      PT LONG TERM GOAL #4   Title  The patient will be able to ambulate  with appropriate assistive device 500 feet on paved level surfaces without loss of balance     Time  8    Period  Weeks    Status  New      PT LONG TERM GOAL #5   Title  FOTO functional outcome score improved from 46% limitation to 39% indicating improved function with less pain     Time  8    Period  Weeks    Status  New            Plan - 04/11/18 1726    Clinical Impression Statement  The patient has made a significant improvement in BERG balance test by 6 points.  She still is at  significant risk for falls (80%) and would benefit from additional neuromuscular reeducation for work on her balance.  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  She needs min assist from therapist at times secondary to unsteadiness with moderate balance challenges.  On track to meet STGs.       Rehab Potential  Good    Clinical Impairments Affecting Rehab Potential  Multi-level cervical fusion 12/06/17    PT Frequency  2x / week    PT Duration  8 weeks    PT Treatment/Interventions  ADLs/Self Care Home Management;Electrical Stimulation;Cryotherapy;Moist Heat;Therapeutic exercise;Therapeutic activities;Neuromuscular re-education;Patient/family education;Manual techniques;Balance training    PT Next Visit Plan  recheck 5x sit to stand and TUG for STGS;  balance activity as this is importance for patient; standing tolerance as needed for work duties;  progress UE/LE strength, wrist pronation/supination strength     PT Home Exercise Plan  [email or web address]       Patient will benefit from skilled therapeutic intervention in order to improve the following deficits and impairments:  Abnormal gait, Pain, Decreased activity tolerance, Decreased range of motion, Decreased strength, Impaired perceived functional ability, Difficulty walking, Decreased balance  Visit Diagnosis: Muscle weakness (generalized)  Difficulty in walking, not elsewhere  classified  Radiculopathy, cervical region     Problem List Patient Active Problem List   Diagnosis Date Noted  . Leukocytosis   . Hypokalemia   . Labile blood pressure   . Benign essential HTN   . Postoperative pain   . Hypertensive crisis   . 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 Hypoalbuminemia due to protein-calorie malnutrition ([place])   . Acute blood loss anemia   . Myelopathy ([place]) 12/10/2017  . HNP (herniated nucleus pulposus) with myelopathy, cervical   . Essential hypertension   . Uncomplicated asthma   . Insulin resistance   . Hashimoto's disease   . Cervical spondylolysis   . Anxiety   . Hyponatremia 12/01/2017  . Hypothyroidism 12/01/2017   Ruben Im, PT 04/11/18 5:37 PM Phone: [phone] Fax: [phone] Alvera Singh 04/11/2018, 5:35 PM  [facility] [address] [place], [place], [address] Phone: [phone]   Fax:  [phone]  Name: Regina [name] MRN: [id number] Date of Birth: 1940/09/28

## 2018-04-16 ENCOUNTER — Ambulatory Visit: Payer: Federal, State, Local not specified - PPO | Admitting: Physical Therapy

## 2018-04-16 DIAGNOSIS — R262 Difficulty in walking, not elsewhere classified: Secondary | ICD-10-CM

## 2018-04-16 DIAGNOSIS — M6281 Muscle weakness (generalized): Secondary | ICD-10-CM | POA: Diagnosis not present

## 2018-04-16 DIAGNOSIS — M5412 Radiculopathy, cervical region: Secondary | ICD-10-CM

## 2018-04-16 NOTE — Therapy (Signed)
[facility] [address], [place], [place], [address] Phone: [phone]   Fax:  [phone]  Physical Therapy Treatment  Patient Details  Name: Regina [name] MRN: [id number] Date of Birth: 02-25-1941 Referring Provider (PT): Dr. Maurice Small   Encounter Date: 04/16/2018  PT End of Session - 04/16/18 1938    Visit Number  6    Date for PT Re-Evaluation  05/09/18    Authorization Type  BCBS 50 visit limit    PT Start Time  1148    PT Stop Time  1230    PT Time Calculation (min)  42 min    Activity Tolerance  Patient tolerated treatment well       Past Medical History:  Diagnosis Date  . Anxiety   . Arthritis    neck  . Asthma   . Cervical spondylolysis   . Cervical spondylolysis   . Depression   . Hashimoto's disease   . Insulin resistance   . PONV (postoperative nausea and vomiting)    hasn't had surgery since 1993    Past Surgical History:  Procedure Laterality Date  . ANTERIOR CERVICAL DECOMP/DISCECTOMY FUSION N/A 12/06/2017   Procedure: ANTERIOR CERVICAL DECOMPRESSION/DISCECTOMY FUSION, CERVICAL 3- CERVICAL 4, CERVICAL 4- CERVICAL 5, CERVICAL 5- CERVICAL 6;  Surgeon: [name], MD;  Location: [place];  Service: Neurosurgery;  Laterality: N/A;  ANTERIOR CERVICAL DECOMPRESSION/DISCECTOMY FUSION, CERVICAL THREE- CERVICAL FOUR, CERVICAL FOUR- CERVICAL FIVE, CERVICAL FIVE- CERVICAL SIX  . APPENDECTOMY    . BREAST SURGERY     left biopsy  . CESAREAN SECTION     x2  . CHOLECYSTECTOMY    . DENTAL SURGERY     teeth removal  . OVARY SURGERY     ovarian wedge  . TONSILLECTOMY      There were no vitals filed for this visit.  Subjective Assessment - 04/16/18 1154    Subjective  Patient states she almost fell when she got up too quickly during the night.  Continues to use her cane full time.      Pertinent History  cervical myelopathy  ACDF C3-6 12/06/17  "Regina [name]"    Patient Stated Goals  not  constantly worry about falling;  get balance back;  get back to work at the [employer] end of January     Currently in Pain?  No/denies    Pain Score  0-No pain         [facility] PT Assessment - 04/16/18 0001      Standardized Balance Assessment   Five times sit to stand comments   14.91 no hands      Timed Up and Go Test   Normal TUG (seconds)  22.22   with cane                  [facility] Adult PT Treatment/Exercise - 04/16/18 0001      Therapeutic Activites    Work Simulation  standing weight shifting and using 2# weight pass hand to hand, shouldr to shoulder, hip to hip  to simulate her work at the [employer] Re-ed    Neuro Re-ed Details   mild to moderate balance challenges with varying base of support;  ball toss, UE reaches;  add conversation while maintaining balance       Lumbar Exercises: Stretches   Active Hamstring Stretch  Right;Left;5 reps   on 2nd step    Hip Flexor Stretch Limitations  on 2nd step 5x right/left     Gastroc Stretch Limitations  slant board stretch 5x right/left      Knee/Hip Exercises: Stretches   Active Hamstring Stretch  --   on 2nd step 5x each leg   Hip Flexor Stretch  Right;Left;5 reps    Hip Flexor Stretch Limitations  on 2nd step      Knee/Hip Exercises: Aerobic   Nustep  L2 10 min       Knee/Hip Exercises: Standing   Heel Raises  Both;1 set;10 reps    Heel Raises Limitations  toe raises 10x    Forward Step Up  Right;Left;10 reps    Other Standing Knee Exercises  golfers reach to knee level 5x right/left                PT Short Term Goals - 04/16/18 1945      PT SHORT TERM GOAL #1   Title  The patient will have improved gait speed with appropriate assistive device with TUG to 17 sec    Time  4    Period  Weeks    Status  On-going      PT SHORT TERM GOAL #2   Title  BERG balance score improved to 40/56 indicating improved dynamic balance and decreased risk for falls    Status  Achieved      PT SHORT  TERM GOAL #3   Title  The patient will have improved core and proximal UE strength needed to transfer from supine to sitting independently    Status  Achieved      PT SHORT TERM GOAL #4   Title  Improved LE strength as evident with improved 5x sit to stand test in 20 sec or less    Status  Achieved        PT Long Term Goals - 03/14/18 1944      PT LONG TERM GOAL #1   Title  The patient will be independent with HEP needed for further improvements in mobility, balance and strength    Time  8    Period  Weeks    Status  New    Target Date  05/09/18      PT LONG TERM GOAL #2   Title  The patient will have improved BERG balance test to 45/56 indicating improved gait safety and balance    Time  8    Period  Weeks    Status  New      PT LONG TERM GOAL #3   Title  The patient will have improved grip strength to 30# and pincher squeeze to 8# needed for work tasks    Time  8    Period  Weeks    Status  New      PT LONG TERM GOAL #4   Title  The patient will be able to ambulate with appropriate assistive device 500 feet on paved level surfaces without loss of balance     Time  8    Period  Weeks    Status  New      PT LONG TERM GOAL #5   Title  FOTO functional outcome score improved from 46% limitation to 39% indicating improved function with less pain     Time  8    Period  Weeks    Status  New            Plan - 04/16/18 1939    Clinical Impression Statement  The  patient has a signficant improvement in 5x sit to stand test.  Her Timed up and Go speed was slower today secondary to walking with more caution following her near fall at home.  She is able to participate in mild to moderate dynamic balance challenges with therapist CGA for safety.  Her balance improves as the treatment session progresses and she is "warmed up".  Will continue with therapeutic activities as well to prepare for her plan to return to work which requires prolonged standing.  Majority of STGs met.       Rehab Potential  Good    Clinical Impairments Affecting Rehab Potential  Multi-level cervical fusion 12/06/17    PT Frequency  2x / week    PT Duration  8 weeks    PT Treatment/Interventions  ADLs/Self Care Home Management;Electrical Stimulation;Cryotherapy;Moist Heat;Therapeutic exercise;Therapeutic activities;Neuromuscular re-education;Patient/family education;Manual techniques;Balance training    PT Next Visit Plan    balance activity as this is importance for patient; standing tolerance as needed for work duties;  progress UE/LE strength, wrist pronation/supination strength     PT Home Exercise Plan  [id number]       Patient will benefit from skilled therapeutic intervention in order to improve the following deficits and impairments:  Abnormal gait, Pain, Decreased activity tolerance, Decreased range of motion, Decreased strength, Impaired perceived functional ability, Difficulty walking, Decreased balance  Visit Diagnosis: Muscle weakness (generalized)  Difficulty in walking, not elsewhere classified  Radiculopathy, cervical region     Problem List Patient Active Problem List   Diagnosis Date Noted  . Leukocytosis   . Hypokalemia   . Labile blood pressure   . Benign essential HTN   . Postoperative pain   . Hypertensive crisis   . 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 Hypoalbuminemia due to protein-calorie malnutrition ([place])   . Acute blood loss anemia   . Myelopathy ([place]) 12/10/2017  . HNP (herniated nucleus pulposus) with myelopathy, cervical   . Essential hypertension   . Uncomplicated asthma   . Insulin resistance   . Hashimoto's disease   . Cervical spondylolysis   . Anxiety   . Hyponatremia 12/01/2017  . Hypothyroidism 12/01/2017   Regina [name], PT 04/16/18 7:47 PM Phone: [phone] Fax: [phone] Regina [name] 04/16/2018, 7:46 PM  [facility] [address] [place],  [place], [address] Phone: [phone]   Fax:  [phone]  Name: Regina [name] MRN: [id number] Date of Birth: [birth year]

## 2018-04-19 ENCOUNTER — Encounter: Payer: Self-pay | Admitting: Physical Therapy

## 2018-04-19 ENCOUNTER — Ambulatory Visit: Payer: Federal, State, Local not specified - PPO | Admitting: Physical Therapy

## 2018-04-19 DIAGNOSIS — M6281 Muscle weakness (generalized): Secondary | ICD-10-CM | POA: Diagnosis not present

## 2018-04-19 DIAGNOSIS — R262 Difficulty in walking, not elsewhere classified: Secondary | ICD-10-CM

## 2018-04-19 DIAGNOSIS — M5412 Radiculopathy, cervical region: Secondary | ICD-10-CM

## 2018-04-19 NOTE — Therapy (Signed)
[facility] [address], [place], [place], [address] Phone: [phone]   Fax:  [phone]  Physical Therapy Treatment  Patient Details  Name: Regina [name] MRN: [id number] Date of Birth: 1940/05/31 Referring Provider (PT): Dr. Maurice Small   Encounter Date: 04/19/2018  PT End of Session - 04/19/18 1023    Visit Number  7    Date for PT Re-Evaluation  05/09/18    Authorization Type  BCBS 50 visit limit    PT Start Time  5643   pt late   PT Stop Time  1101    PT Time Calculation (min)  38 min    Activity Tolerance  Patient tolerated treatment well       Past Medical History:  Diagnosis Date  . Anxiety   . Arthritis    neck  . Asthma   . Cervical spondylolysis   . Cervical spondylolysis   . Depression   . Hashimoto's disease   . Insulin resistance   . PONV (postoperative nausea and vomiting)    hasn't had surgery since 1993    Past Surgical History:  Procedure Laterality Date  . ANTERIOR CERVICAL DECOMP/DISCECTOMY FUSION N/A 12/06/2017   Procedure: ANTERIOR CERVICAL DECOMPRESSION/DISCECTOMY FUSION, CERVICAL 3- CERVICAL 4, CERVICAL 4- CERVICAL 5, CERVICAL 5- CERVICAL 6;  Surgeon: [name], MD;  Location: [place];  Service: Neurosurgery;  Laterality: N/A;  ANTERIOR CERVICAL DECOMPRESSION/DISCECTOMY FUSION, CERVICAL THREE- CERVICAL FOUR, CERVICAL FOUR- CERVICAL FIVE, CERVICAL FIVE- CERVICAL SIX  . APPENDECTOMY    . BREAST SURGERY     left biopsy  . CESAREAN SECTION     x2  . CHOLECYSTECTOMY    . DENTAL SURGERY     teeth removal  . OVARY SURGERY     ovarian wedge  . TONSILLECTOMY      There were no vitals filed for this visit.  Subjective Assessment - 04/19/18 1023    Subjective  "A little soreness in my legs but that's OK."  "My balance is fair, not great."    Pertinent History  cervical myelopathy  ACDF C3-6 12/06/17  "Regina [name]"    How long can you walk comfortably?  with shopping  cart a long time;  around the house OK 1 hour;  outside with cane more difficulty when unlevel     Patient Stated Goals  not constantly worry about falling;  get balance back;  get back to work at the [employer] end of January     Currently in Pain?  No/denies    Pain Score  0-No pain    Pain Type  Chronic pain                       [facility] Adult PT Treatment/Exercise - 04/19/18 0001      Therapeutic Activites    Work Simulation  standing UE gripping, pushing, pulling to simulate work duties ;  Carrying objects while standing and walking      Neuro Re-ed    Neuro Re-ed Details   mild to moderate static and dynamic balance challenges;  ladder walk  Forward and side stepping with and without cane      Neck Exercises: Standing   Other Standing Exercises  standing on black foam with 2# weight shoulder to shoulder, hip to hip and hand to hand       Knee/Hip Exercises: Aerobic   Nustep  L2 10 min  Therapist discussing progress and  Knee/Hip Exercises: Standing   Heel Raises Limitations  heel and toe raises 10x    Gait Training  carrying 3# and 5# weight in gym     Other Standing Knee Exercises  step taps 10x    Other Standing Knee Exercises  2 ways hips 10x right/left                PT Short Term Goals - 04/16/18 1945      PT SHORT TERM GOAL #1   Title  The patient will have improved gait speed with appropriate assistive device with TUG to 17 sec    Time  4    Period  Weeks    Status  On-going      PT SHORT TERM GOAL #2   Title  BERG balance score improved to 40/56 indicating improved dynamic balance and decreased risk for falls    Status  Achieved      PT SHORT TERM GOAL #3   Title  The patient will have improved core and proximal UE strength needed to transfer from supine to sitting independently    Status  Achieved      PT SHORT TERM GOAL #4   Title  Improved LE strength as evident with improved 5x sit to stand test in 20 sec or less    Status   Achieved        PT Long Term Goals - 03/14/18 1944      PT LONG TERM GOAL #1   Title  The patient will be independent with HEP needed for further improvements in mobility, balance and strength    Time  8    Period  Weeks    Status  New    Target Date  05/09/18      PT LONG TERM GOAL #2   Title  The patient will have improved BERG balance test to 45/56 indicating improved gait safety and balance    Time  8    Period  Weeks    Status  New      PT LONG TERM GOAL #3   Title  The patient will have improved grip strength to 30# and pincher squeeze to 8# needed for work tasks    Time  8    Period  Weeks    Status  New      PT LONG TERM GOAL #4   Title  The patient will be able to ambulate with appropriate assistive device 500 feet on paved level surfaces without loss of balance     Time  8    Period  Weeks    Status  New      PT LONG TERM GOAL #5   Title  FOTO functional outcome score improved from 46% limitation to 39% indicating improved function with less pain     Time  8    Period  Weeks    Status  New            Plan - 04/19/18 1203    Clinical Impression Statement  The patient is able to perform a progession of balance and functional strengthening exercises with contact guard assist only.  Cues to avoid dragging her left foot at times.  She is unable to elevate her left shoulder secondary to a rotator cuff tear from her fall she reports.  No pain reported during treatment.   She continues to be motivated and has a positive outlook on her recovery.  Rehab Potential  Good    Clinical Impairments Affecting Rehab Potential  Multi-level cervical fusion 12/06/17    PT Frequency  2x / week    PT Duration  8 weeks    PT Treatment/Interventions  ADLs/Self Care Home Management;Electrical Stimulation;Cryotherapy;Moist Heat;Therapeutic exercise;Therapeutic activities;Neuromuscular re-education;Patient/family education;Manual techniques;Balance training    PT Next Visit Plan     recheck TUG;  balance activity standing tolerance as needed for work duties;  progress UE/LE strength, wrist pronation/supination strength     PT Home Exercise Plan  [id number]       Patient will benefit from skilled therapeutic intervention in order to improve the following deficits and impairments:  Abnormal gait, Pain, Decreased activity tolerance, Decreased range of motion, Decreased strength, Impaired perceived functional ability, Difficulty walking, Decreased balance  Visit Diagnosis: Muscle weakness (generalized)  Difficulty in walking, not elsewhere classified  Radiculopathy, cervical region     Problem List Patient Active Problem List   Diagnosis Date Noted  . Leukocytosis   . Hypokalemia   . Labile blood pressure   . Benign essential HTN   . Postoperative pain   . Hypertensive crisis   . 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 Hypoalbuminemia due to protein-calorie malnutrition ([place])   . Acute blood loss anemia   . Myelopathy ([place]) 12/10/2017  . HNP (herniated nucleus pulposus) with myelopathy, cervical   . Essential hypertension   . Uncomplicated asthma   . Insulin resistance   . Hashimoto's disease   . Cervical spondylolysis   . Anxiety   . Hyponatremia 12/01/2017  . Hypothyroidism 12/01/2017   Ruben Im, PT 04/19/18 12:18 PM Phone: [phone] Fax: [phone]  Alvera Singh 04/19/2018, 12:15 PM  [facility] [address], [place], [place], [address] Phone: [phone]   Fax:  [phone]  Name: KEIRRA ZEIMET MRN: [id number] Date of Birth: 02/14/1941

## 2018-04-23 ENCOUNTER — Ambulatory Visit: Payer: Federal, State, Local not specified - PPO | Admitting: Physical Therapy

## 2018-04-23 DIAGNOSIS — M6281 Muscle weakness (generalized): Secondary | ICD-10-CM | POA: Diagnosis not present

## 2018-04-23 DIAGNOSIS — R262 Difficulty in walking, not elsewhere classified: Secondary | ICD-10-CM

## 2018-04-23 NOTE — Therapy (Signed)
[facility] [address] [name], [place], [address] Phone: [phone]   Fax:  [phone]  Physical Therapy Treatment  Patient Details  Name: Regina [name] MRN: [id number] Date of Birth: Feb 26, 1941 Referring Provider (PT): Dr. Maurice Small   Encounter Date: 04/23/2018  PT End of Session - 04/23/18 1257    Visit Number  8    Date for PT Re-Evaluation  05/09/18    Authorization Type  BCBS 50 visit limit    PT Start Time  1240    PT Stop Time  1318    PT Time Calculation (min)  38 min    Activity Tolerance  Patient tolerated treatment well       Past Medical History:  Diagnosis Date  . Anxiety   . Arthritis    neck  . Asthma   . Cervical spondylolysis   . Cervical spondylolysis   . Depression   . Hashimoto's disease   . Insulin resistance   . PONV (postoperative nausea and vomiting)    hasn't had surgery since 1993    Past Surgical History:  Procedure Laterality Date  . ANTERIOR CERVICAL DECOMP/DISCECTOMY FUSION N/A 12/06/2017   Procedure: ANTERIOR CERVICAL DECOMPRESSION/DISCECTOMY FUSION, CERVICAL 3- CERVICAL 4, CERVICAL 4- CERVICAL 5, CERVICAL 5- CERVICAL 6;  Surgeon: [name], MD;  Location: [facility] [facility];  Service: Neurosurgery;  Laterality: N/A;  ANTERIOR CERVICAL DECOMPRESSION/DISCECTOMY FUSION, CERVICAL THREE- CERVICAL FOUR, CERVICAL FOUR- CERVICAL FIVE, CERVICAL FIVE- CERVICAL SIX  . APPENDECTOMY    . BREAST SURGERY     left biopsy  . CESAREAN SECTION     x2  . CHOLECYSTECTOMY    . DENTAL SURGERY     teeth removal  . OVARY SURGERY     ovarian wedge  . TONSILLECTOMY      There were no vitals filed for this visit.  Subjective Assessment - 04/23/18 1246    Subjective  My legs hurt from not being used.  I didn't do my ex's over teh weekend.      Pertinent History  cervical myelopathy  ACDF C3-6 12/06/17  "[name]"    Patient Stated Goals  not constantly worry about falling;  get  balance back;  get back to work at the [employer] end of January     Currently in Pain?  Yes    Pain Score  1     Pain Type  Chronic pain    Pain Radiating Towards  my legs and feet    Aggravating Factors   being on my feet                       [facility] Adult PT Treatment/Exercise - 04/23/18 0001      Therapeutic Activites    Work Simulation  standing UE gripping, pushing, pulling to simulate work duties       Neuro Re-ed    Neuro Re-ed Details   mild to moderate static and dynamic balance challenges       Knee/Hip Exercises: Stretches   Other Knee/Hip Stretches  slant board 5x right/left       Knee/Hip Exercises: Aerobic   Nustep  L2 10 min       Knee/Hip Exercises: Standing   Rebounder  3 way weight shift without holding on 1 minute each;  marching with light UE touch 1 minute     Walking with Sports Cord  green band resisted walking 10x backward     Gait  Training  tap to foam roll 10x each leg     Other Standing Knee Exercises  rocker board 2 minutes     Other Standing Knee Exercises  stepping to the side and forward over foam roll 10x right/left                PT Short Term Goals - 04/16/18 1945      PT SHORT TERM GOAL #1   Title  The patient will have improved gait speed with appropriate assistive device with TUG to 17 sec    Time  4    Period  Weeks    Status  On-going      PT SHORT TERM GOAL #2   Title  BERG balance score improved to 40/56 indicating improved dynamic balance and decreased risk for falls    Status  Achieved      PT SHORT TERM GOAL #3   Title  The patient will have improved core and proximal UE strength needed to transfer from supine to sitting independently    Status  Achieved      PT SHORT TERM GOAL #4   Title  Improved LE strength as evident with improved 5x sit to stand test in 20 sec or less    Status  Achieved        PT Long Term Goals - 03/14/18 1944      PT LONG TERM GOAL #1   Title  The patient will be  independent with HEP needed for further improvements in mobility, balance and strength    Time  8    Period  Weeks    Status  New    Target Date  05/09/18      PT LONG TERM GOAL #2   Title  The patient will have improved BERG balance test to 45/56 indicating improved gait safety and balance    Time  8    Period  Weeks    Status  New      PT LONG TERM GOAL #3   Title  The patient will have improved grip strength to 30# and pincher squeeze to 8# needed for work tasks    Time  8    Period  Weeks    Status  New      PT LONG TERM GOAL #4   Title  The patient will be able to ambulate with appropriate assistive device 500 feet on paved level surfaces without loss of balance     Time  8    Period  Weeks    Status  New      PT LONG TERM GOAL #5   Title  FOTO functional outcome score improved from 46% limitation to 39% indicating improved function with less pain     Time  8    Period  Weeks    Status  New            Plan - 04/23/18 1603    Clinical Impression Statement  The patient continues to make steady improvements in ability to self -stabilize without therapist assist with mild to moderate balance challenges.  Her gait speed slows significantly when ambulating in the clinic without her cane.  She reports some left LE muscular trembling from muscle fatigue with prolonged standing strengthening ex.  Will continue functional strengthening in standing to help prepare for her return to work at the [employer].  Therapist providing close supervision for safety.      Rehab Potential  Good    Clinical Impairments Affecting Rehab Potential  Multi-level cervical fusion 12/06/17    PT Frequency  2x / week    PT Duration  8 weeks    PT Treatment/Interventions  ADLs/Self Care Home Management;Electrical Stimulation;Cryotherapy;Moist Heat;Therapeutic exercise;Therapeutic activities;Neuromuscular re-education;Patient/family education;Manual techniques;Balance training    PT Next Visit  Plan    recheck TUG; check grip strength;  balance activity standing tolerance as needed for work duties;  progress UE/LE strength, wrist pronation/supination strength     PT Home Exercise Plan  [id number]       Patient will benefit from skilled therapeutic intervention in order to improve the following deficits and impairments:  Abnormal gait, Pain, Decreased activity tolerance, Decreased range of motion, Decreased strength, Impaired perceived functional ability, Difficulty walking, Decreased balance  Visit Diagnosis: Muscle weakness (generalized)  Difficulty in walking, not elsewhere classified     Problem List Patient Active Problem List   Diagnosis Date Noted  . Leukocytosis   . Hypokalemia   . Labile blood pressure   . Benign essential HTN   . Postoperative pain   . Hypertensive crisis   . 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 Hypoalbuminemia due to protein-calorie malnutrition ([place])   . Acute blood loss anemia   . Myelopathy ([place]) 12/10/2017  . HNP (herniated nucleus pulposus) with myelopathy, cervical   . Essential hypertension   . Uncomplicated asthma   . Insulin resistance   . Hashimoto's disease   . Cervical spondylolysis   . Anxiety   . Hyponatremia 12/01/2017  . Hypothyroidism 12/01/2017   Ruben Im, PT 04/23/18 4:14 PM Phone: [phone] Fax: [phone]  Alvera Singh 04/23/2018, 4:14 PM   [facility] [address], [place], [place], [address] Phone: [phone]   Fax:  [phone]  Name: JEANNY RYMER MRN: [id number] Date of Birth: 1940-05-23

## 2018-04-25 ENCOUNTER — Ambulatory Visit: Payer: Federal, State, Local not specified - PPO | Admitting: Physical Therapy

## 2018-04-25 DIAGNOSIS — R262 Difficulty in walking, not elsewhere classified: Secondary | ICD-10-CM

## 2018-04-25 DIAGNOSIS — M5412 Radiculopathy, cervical region: Secondary | ICD-10-CM

## 2018-04-25 DIAGNOSIS — M6281 Muscle weakness (generalized): Secondary | ICD-10-CM

## 2018-04-25 NOTE — Therapy (Signed)
[facility] [address], [place], [place], [address] Phone: [phone]   Fax:  [phone]  Physical Therapy Treatment  Patient Details  Name: Regina [name] MRN: [id number] Date of Birth: 1940-04-23 Referring Provider (PT): Dr. Maurice Small   Encounter Date: 04/25/2018  PT End of Session - 04/25/18 1545    Visit Number  9    Date for PT Re-Evaluation  05/09/18    Authorization Type  BCBS 50 visit limit    PT Start Time  1536   pt late   PT Stop Time  1614    PT Time Calculation (min)  38 min    Activity Tolerance  Patient tolerated treatment well       Past Medical History:  Diagnosis Date  . Anxiety   . Arthritis    neck  . Asthma   . Cervical spondylolysis   . Cervical spondylolysis   . Depression   . Hashimoto's disease   . Insulin resistance   . PONV (postoperative nausea and vomiting)    hasn't had surgery since 1993    Past Surgical History:  Procedure Laterality Date  . ANTERIOR CERVICAL DECOMP/DISCECTOMY FUSION N/A 12/06/2017   Procedure: ANTERIOR CERVICAL DECOMPRESSION/DISCECTOMY FUSION, CERVICAL 3- CERVICAL 4, CERVICAL 4- CERVICAL 5, CERVICAL 5- CERVICAL 6;  Surgeon: [name], MD;  Location: [facility];  Service: Neurosurgery;  Laterality: N/A;  ANTERIOR CERVICAL DECOMPRESSION/DISCECTOMY FUSION, CERVICAL THREE- CERVICAL FOUR, CERVICAL FOUR- CERVICAL FIVE, CERVICAL FIVE- CERVICAL SIX  . APPENDECTOMY    . BREAST SURGERY     left biopsy  . CESAREAN SECTION     x2  . CHOLECYSTECTOMY    . DENTAL SURGERY     teeth removal  . OVARY SURGERY     ovarian wedge  . TONSILLECTOMY      There were no vitals filed for this visit.  Subjective Assessment - 04/25/18 1552    Subjective  I forget my cane sometimes.  This morning my right arm felt heavy but it's better now.      Pertinent History  cervical myelopathy  ACDF C3-6 12/06/17  "Regina [name]"    Patient Stated Goals  not constantly worry  about falling;  get balance back;  get back to work at the [employer] end of January     Currently in Pain?  No/denies    Pain Score  0-No pain         [facility] PT Assessment - 04/25/18 0001      Strength   Right Hand Grip (lbs)  25    Right Hand Lateral Pinch  10 lbs    Left Hand Grip (lbs)  23    Left Hand Lateral Pinch  8 lbs      Timed Up and Go Test   Normal TUG (seconds)  17.5   18.5 without cane                  [facility] Adult PT Treatment/Exercise - 04/25/18 0001      Neck Exercises: Machines for Strengthening   Other Machines for Strengthening  Leg press seat 7 55# bil 15x; 35# single leg 10x right/left       Knee/Hip Exercises: Stretches   Other Knee/Hip Stretches  doorway 3 ways hip flexor stretch with UE raises 5x each       Knee/Hip Exercises: Aerobic   Nustep  L2 10 min       Knee/Hip Exercises: Standing   Gait Training  wall climbs 10x UE lift with hip lift     Other Standing Knee Exercises  alternating mini lunge on BOSU 10x right left    Other Standing Knee Exercises  wall push ups straight and alternating 10x each                PT Short Term Goals - 04/25/18 1722      PT SHORT TERM GOAL #1   Title  The patient will have improved gait speed with appropriate assistive device with TUG to 17 sec    Status  Achieved      PT SHORT TERM GOAL #2   Title  BERG balance score improved to 40/56 indicating improved dynamic balance and decreased risk for falls    Status  Achieved      PT SHORT TERM GOAL #3   Title  The patient will have improved core and proximal UE strength needed to transfer from supine to sitting independently    Status  Achieved      PT SHORT TERM GOAL #4   Title  Improved LE strength as evident with improved 5x sit to stand test in 20 sec or less    Status  Achieved        PT Long Term Goals - 03/14/18 1944      PT LONG TERM GOAL #1   Title  The patient will be independent with HEP needed for further improvements in  mobility, balance and strength    Time  8    Period  Weeks    Status  New    Target Date  05/09/18      PT LONG TERM GOAL #2   Title  The patient will have improved BERG balance test to 45/56 indicating improved gait safety and balance    Time  8    Period  Weeks    Status  New      PT LONG TERM GOAL #3   Title  The patient will have improved grip strength to 30# and pincher squeeze to 8# needed for work tasks    Time  8    Period  Weeks    Status  New      PT LONG TERM GOAL #4   Title  The patient will be able to ambulate with appropriate assistive device 500 feet on paved level surfaces without loss of balance     Time  8    Period  Weeks    Status  New      PT LONG TERM GOAL #5   Title  FOTO functional outcome score improved from 46% limitation to 39% indicating improved function with less pain     Time  8    Period  Weeks    Status  New            Plan - 04/25/18 1712    Clinical Impression Statement  The patient demonstrates improved gait speed with Timed up and Go as well as with grip and pincher strength bilaterally.  She continues to be at risk for falls and has 1 episode of loss of balance requiring therapist assist to stabilize.  She is able to progress LE strengthening without complaints of pain but she does report general fatigue following treatment session.      Rehab Potential  Good    Clinical Impairments Affecting Rehab Potential  Multi-level cervical fusion 12/06/17    PT Duration  8 weeks    PT Treatment/Interventions  ADLs/Self Care Home Management;Electrical Stimulation;Cryotherapy;Moist Heat;Therapeutic exercise;Therapeutic activities;Neuromuscular re-education;Patient/family education;Manual techniques;Balance training    PT Next Visit Plan    10th visit prog note;  do 6 min walk test;   balance activity standing tolerance as needed for work duties;  progress UE/LE strength    PT Home Exercise Plan  [id number]       Patient will benefit from skilled  therapeutic intervention in order to improve the following deficits and impairments:  Abnormal gait, Pain, Decreased activity tolerance, Decreased range of motion, Decreased strength, Impaired perceived functional ability, Difficulty walking, Decreased balance  Visit Diagnosis: Muscle weakness (generalized)  Difficulty in walking, not elsewhere classified  Radiculopathy, cervical region     Problem List Patient Active Problem List   Diagnosis Date Noted  . Leukocytosis   . Hypokalemia   . Labile blood pressure   . Benign essential HTN   . Postoperative pain   . Hypertensive crisis   . 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 Hypoalbuminemia due to protein-calorie malnutrition ([place])   . Acute blood loss anemia   . Myelopathy ([place]) 12/10/2017  . HNP (herniated nucleus pulposus) with myelopathy, cervical   . Essential hypertension   . Uncomplicated asthma   . Insulin resistance   . Hashimoto's disease   . Cervical spondylolysis   . Anxiety   . Hyponatremia 12/01/2017  . Hypothyroidism 12/01/2017   Ruben Im, PT 04/25/18 5:24 PM Phone: [phone] Fax: [phone] Alvera Singh 04/25/2018, 5:24 PM  [facility] [address], [place], [place], [address] Phone: [phone]   Fax:  [phone]  Name: Regina [name] MRN: [id number] Date of Birth: Aug 27, 1940

## 2018-04-30 ENCOUNTER — Ambulatory Visit: Payer: Federal, State, Local not specified - PPO | Admitting: Physical Therapy

## 2018-04-30 ENCOUNTER — Encounter: Payer: Self-pay | Admitting: Physical Therapy

## 2018-04-30 DIAGNOSIS — M6281 Muscle weakness (generalized): Secondary | ICD-10-CM

## 2018-04-30 DIAGNOSIS — R262 Difficulty in walking, not elsewhere classified: Secondary | ICD-10-CM

## 2018-04-30 DIAGNOSIS — M5412 Radiculopathy, cervical region: Secondary | ICD-10-CM

## 2018-04-30 NOTE — Therapy (Signed)
[facility] [address], [place], [place], [address] Phone: [phone]   Fax:  [phone]  Physical Therapy Treatment  Patient Details  Name: Regina [name] MRN: [id number] Date of Birth: [birth year] Referring Provider (PT): Dr. Maurice Small   Encounter Date: 04/30/2018  PT End of Session - 04/30/18 1319    Visit Number  10    Date for PT Re-Evaluation  05/09/18    Authorization Type  BCBS 50 visit limit    PT Start Time  1238    PT Stop Time  1322    PT Time Calculation (min)  44 min    Activity Tolerance  Patient tolerated treatment well        Progress Note Reporting Period 03/14/2018 to 04/30/2018  See note below for Objective Data and Assessment of Progress/Goals.      Past Medical History:  Diagnosis Date  . Anxiety   . Arthritis    neck  . Asthma   . Cervical spondylolysis   . Cervical spondylolysis   . Depression   . Hashimoto's disease   . Insulin resistance   . PONV (postoperative nausea and vomiting)    hasn't had surgery since 1993    Past Surgical History:  Procedure Laterality Date  . ANTERIOR CERVICAL DECOMP/DISCECTOMY FUSION N/A 12/06/2017   Procedure: ANTERIOR CERVICAL DECOMPRESSION/DISCECTOMY FUSION, CERVICAL 3- CERVICAL 4, CERVICAL 4- CERVICAL 5, CERVICAL 5- CERVICAL 6;  Surgeon: [name], MD;  Location: [facility];  Service: Neurosurgery;  Laterality: N/A;  ANTERIOR CERVICAL DECOMPRESSION/DISCECTOMY FUSION, CERVICAL THREE- CERVICAL FOUR, CERVICAL FOUR- CERVICAL FIVE, CERVICAL FIVE- CERVICAL SIX  . APPENDECTOMY    . BREAST SURGERY     left biopsy  . CESAREAN SECTION     x2  . CHOLECYSTECTOMY    . DENTAL SURGERY     teeth removal  . OVARY SURGERY     ovarian wedge  . TONSILLECTOMY      There were no vitals filed for this visit.  Subjective Assessment - 04/30/18 1237    Subjective  My balance has not been good the last 2 days but today it was better.  I  worked at [employer] on Saturday for 2 1/2 hours.  I got really tired.  One of my co-workers gave me a big hug and I almost fell.  I'm supposed to work Thursday and Saturday.    (Pended)     Pertinent History  cervical myelopathy  ACDF C3-6 12/06/17  "[name]"  (Pended)     Patient Stated Goals  not constantly worry about falling;  get balance back;  get back to work at the [employer] end of January   (Pended)     Currently in Pain?  No/denies  (Pended)     Pain Score  0-No pain  (Pended)          [facility] PT Assessment - 04/30/18 0001      Strength   Right Hand Grip (lbs)  25    Left Hand Grip (lbs)  23    Right Hip Flexion  4+/5    Right Hip Extension  4/5    Left Hip Flexion  4+/5    Left Hip Extension  4/5    Right Knee Flexion  4+/5    Right Knee Extension  4/5    Left Knee Flexion  4+/5    Left Knee Extension  4/5    Right Ankle Eversion  4+/5    Left Ankle  Dorsiflexion  4/5    Left Ankle Plantar Flexion  4/5    Left Ankle Eversion  4/5      6 Minute Walk- Baseline   6 Minute Walk- Baseline  --   400 feet with SPC                  [facility] Adult PT Treatment/Exercise - 04/30/18 0001      Neuro Re-ed    Neuro Re-ed Details   mild to moderate static and dynamic balance challenges standing on black foam UE raises, head turns, weight shifting, narrow base of support      Neck Exercises: Machines for Strengthening   Other Machines for Strengthening  Leg press seat 7 65# bil 15x; 35# single leg 10x right/left       Knee/Hip Exercises: Aerobic   Nustep  --      Knee/Hip Exercises: Standing   Gait Training  rocker board PF/DF 3 min     Other Standing Knee Exercises  wall push ups straight and alternating 10x each                PT Short Term Goals - 04/25/18 1722      PT SHORT TERM GOAL #1   Title  The patient will have improved gait speed with appropriate assistive device with TUG to 17 sec    Status  Achieved      PT SHORT TERM GOAL #2   Title  BERG  balance score improved to 40/56 indicating improved dynamic balance and decreased risk for falls    Status  Achieved      PT SHORT TERM GOAL #3   Title  The patient will have improved core and proximal UE strength needed to transfer from supine to sitting independently    Status  Achieved      PT SHORT TERM GOAL #4   Title  Improved LE strength as evident with improved 5x sit to stand test in 20 sec or less    Status  Achieved        PT Long Term Goals - 04/30/18 1858      PT LONG TERM GOAL #1   Title  The patient will be independent with HEP needed for further improvements in mobility, balance and strength    Time  8    Period  Weeks    Status  On-going      PT LONG TERM GOAL #2   Title  The patient will have improved BERG balance test to 45/56 indicating improved gait safety and balance    Time  8    Period  Weeks    Status  On-going      PT LONG TERM GOAL #3   Title  The patient will have improved grip strength to 30# and pincher squeeze to 8# needed for work tasks    Time  8    Period  Weeks    Status  On-going      PT LONG TERM GOAL #4   Title  The patient will be able to ambulate with appropriate assistive device 500 feet on paved level surfaces without loss of balance     Time  8    Period  Weeks    Status  On-going      PT LONG TERM GOAL #5   Title  FOTO functional outcome score improved from 46% limitation to 39% indicating improved function with less pain     Time  8    Period  Weeks    Status  On-going            Plan - 04/30/18 1321    Clinical Impression Statement  The patient continues to make steady improvements in UE, LE and core strength and balance although she continues to be at  risk for falls.  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  Her next shift she plans to sit for part of the time.  She had some difficulty with using the ticket scanner secondary to decreased strength in her right hand.  She has 1  episode of loss of balance as she changed directions while walking in the clinic today but she was able to self recover.  Therapist closely monitoring response and providing supervision for safety.  Progressing with rehab goals.      Rehab Potential  Good    Clinical Impairments Affecting Rehab Potential  Multi-level cervical fusion 12/06/17    PT Frequency  2x / week    PT Duration  8 weeks    PT Treatment/Interventions  ADLs/Self Care Home Management;Electrical Stimulation;Cryotherapy;Moist Heat;Therapeutic exercise;Therapeutic activities;Neuromuscular re-education;Patient/family education;Manual techniques;Balance training    PT Next Visit Plan  ERO;  5x sit to stand test; BERG;  FOTO;   thumb strengthening;  balance;  strengthening    PT Home Exercise Plan  [id number]       Patient will benefit from skilled therapeutic intervention in order to improve the following deficits and impairments:  Abnormal gait, Pain, Decreased activity tolerance, Decreased range of motion, Decreased strength, Impaired perceived functional ability, Difficulty walking, Decreased balance  Visit Diagnosis: Muscle weakness (generalized)  Difficulty in walking, not elsewhere classified  Radiculopathy, cervical region     Problem List Patient Active Problem List   Diagnosis Date Noted  . Leukocytosis   . Hypokalemia   . Labile blood pressure   . Benign essential HTN   . Postoperative pain   . Hypertensive crisis   . 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 Hypoalbuminemia due to protein-calorie malnutrition ([place])   . Acute blood loss anemia   . Myelopathy ([place]) 12/10/2017  . HNP (herniated nucleus pulposus) with myelopathy, cervical   . Essential hypertension   . Uncomplicated asthma   . Insulin resistance   . Hashimoto's disease   . Cervical spondylolysis   . Anxiety   . Hyponatremia 12/01/2017  . Hypothyroidism 12/01/2017   Ruben Im, PT 04/30/18 7:01 PM Phone: [phone] Fax:  [phone]  Alvera Singh 04/30/2018, 7:00 PM  [facility] [address] [place], [place], [address] Phone: [phone]   Fax:  [phone]  Name: Regina [name] MRN: [id number] Date of Birth: [birth year]

## 2018-05-02 ENCOUNTER — Ambulatory Visit: Payer: Federal, State, Local not specified - PPO | Admitting: Physical Therapy

## 2018-05-03 ENCOUNTER — Ambulatory Visit: Payer: Federal, State, Local not specified - PPO | Admitting: Physical Therapy

## 2018-05-03 ENCOUNTER — Encounter: Payer: Self-pay | Admitting: Physical Therapy

## 2018-05-03 DIAGNOSIS — M5412 Radiculopathy, cervical region: Secondary | ICD-10-CM

## 2018-05-03 DIAGNOSIS — R262 Difficulty in walking, not elsewhere classified: Secondary | ICD-10-CM

## 2018-05-03 DIAGNOSIS — M6281 Muscle weakness (generalized): Secondary | ICD-10-CM

## 2018-05-03 NOTE — Therapy (Signed)
[facility] [address] [place], [place], [address] Phone: [phone]   Fax:  [phone]  Physical Therapy Treatment  Patient Details  Name: Regina [name] MRN: [id number] Date of Birth: Dec 30, 1940 Referring Provider (PT): Dr. Maurice Small   Encounter Date: 05/03/2018  PT End of Session - 05/03/18 1008    Visit Number  11    Date for PT Re-Evaluation  05/09/18    Authorization Type  BCBS 50 visit limit    PT Start Time  0931    PT Stop Time  1015    PT Time Calculation (min)  44 min    Activity Tolerance  Patient tolerated treatment well       Past Medical History:  Diagnosis Date  . Anxiety   . Arthritis    neck  . Asthma   . Cervical spondylolysis   . Cervical spondylolysis   . Depression   . Hashimoto's disease   . Insulin resistance   . PONV (postoperative nausea and vomiting)    hasn't had surgery since 1993    Past Surgical History:  Procedure Laterality Date  . ANTERIOR CERVICAL DECOMP/DISCECTOMY FUSION N/A 12/06/2017   Procedure: ANTERIOR CERVICAL DECOMPRESSION/DISCECTOMY FUSION, CERVICAL 3- CERVICAL 4, CERVICAL 4- CERVICAL 5, CERVICAL 5- CERVICAL 6;  Surgeon: [name], MD;  Location: [facility];  Service: Neurosurgery;  Laterality: N/A;  ANTERIOR CERVICAL DECOMPRESSION/DISCECTOMY FUSION, CERVICAL THREE- CERVICAL FOUR, CERVICAL FOUR- CERVICAL FIVE, CERVICAL FIVE- CERVICAL SIX  . APPENDECTOMY    . BREAST SURGERY     left biopsy  . CESAREAN SECTION     x2  . CHOLECYSTECTOMY    . DENTAL SURGERY     teeth removal  . OVARY SURGERY     ovarian wedge  . TONSILLECTOMY      There were no vitals filed for this visit.  Subjective Assessment - 05/03/18 0940    Subjective  Work went well yesterday.  I stood for 3 1/2 hours.  Not as much difficulty with using the scanner.  I work again Saturday.      Pertinent History  cervical myelopathy  ACDF C3-6 12/06/17  "[name]"    Currently in  Pain?  No/denies    Pain Score  0-No pain    Pain Type  Chronic pain                       [facility] Adult PT Treatment/Exercise - 05/03/18 0001      Neck Exercises: Machines for Strengthening   UBE (Upper Arm Bike)  standing UBE 1 min forward/1 min backward    Other Machines for Strengthening  Leg press seat 6 65# bil 15x; 35# single leg 10x right/left       Knee/Hip Exercises: Stretches   Hip Flexor Stretch  Right;Left;5 reps    Hip Flexor Stretch Limitations  on 2nd step      Knee/Hip Exercises: Aerobic   Nustep  L3 10 min seat 5       Knee/Hip Exercises: Standing   Other Standing Knee Exercises  wall pull aways using ankle muscles 15x    Other Standing Knee Exercises  single arm wall push ups 10x right/left      Shoulder Exercises: Standing   Flexion  Strengthening;Right;Left;15 reps;Weights    Shoulder Flexion Weight (lbs)  1      Hand Exercises   Digiticizer  #3 10x right/left  PT Short Term Goals - 04/25/18 1722      PT SHORT TERM GOAL #1   Title  The patient will have improved gait speed with appropriate assistive device with TUG to 17 sec    Status  Achieved      PT SHORT TERM GOAL #2   Title  BERG balance score improved to 40/56 indicating improved dynamic balance and decreased risk for falls    Status  Achieved      PT SHORT TERM GOAL #3   Title  The patient will have improved core and proximal UE strength needed to transfer from supine to sitting independently    Status  Achieved      PT SHORT TERM GOAL #4   Title  Improved LE strength as evident with improved 5x sit to stand test in 20 sec or less    Status  Achieved        PT Long Term Goals - 04/30/18 1858      PT LONG TERM GOAL #1   Title  The patient will be independent with HEP needed for further improvements in mobility, balance and strength    Time  8    Period  Weeks    Status  On-going      PT LONG TERM GOAL #2   Title  The patient will have improved  BERG balance test to 45/56 indicating improved gait safety and balance    Time  8    Period  Weeks    Status  On-going      PT LONG TERM GOAL #3   Title  The patient will have improved grip strength to 30# and pincher squeeze to 8# needed for work tasks    Time  8    Period  Weeks    Status  On-going      PT LONG TERM GOAL #4   Title  The patient will be able to ambulate with appropriate assistive device 500 feet on paved level surfaces without loss of balance     Time  8    Period  Weeks    Status  On-going      PT LONG TERM GOAL #5   Title  FOTO functional outcome score improved from 46% limitation to 39% indicating improved function with less pain     Time  8    Period  Weeks    Status  On-going            Plan - 05/03/18 1009    Clinical Impression Statement  The patient continues to improve with functional reactivation but continues to have deficits with balance, decreased left > right hand strength and decreased right > left LE strength.  She reports muscular fatigue appropriate for level of  exercise.  Therapist providing supervision for safety secondary to fall risk.      Rehab Potential  Good    Clinical Impairments Affecting Rehab Potential  Multi-level cervical fusion 12/06/17    PT Frequency  2x / week    PT Duration  8 weeks    PT Treatment/Interventions  ADLs/Self Care Home Management;Electrical Stimulation;Cryotherapy;Moist Heat;Therapeutic exercise;Therapeutic activities;Neuromuscular re-education;Patient/family education;Manual techniques;Balance training    PT Next Visit Plan  ERO;  5x sit to stand test; BERG;  FOTO;   thumb strengthening;  balance;  strengthening UEs and LEs    PT Home Exercise Plan  [id number]       Patient will benefit from skilled therapeutic intervention in order to improve  the following deficits and impairments:  Abnormal gait, Pain, Decreased activity tolerance, Decreased range of motion, Decreased strength, Impaired perceived functional  ability, Difficulty walking, Decreased balance  Visit Diagnosis: Muscle weakness (generalized)  Difficulty in walking, not elsewhere classified  Radiculopathy, cervical region     Problem List Patient Active Problem List   Diagnosis Date Noted  . Leukocytosis   . Hypokalemia   . Labile blood pressure   . Benign essential HTN   . Postoperative pain   . Hypertensive crisis   . 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 Hypoalbuminemia due to protein-calorie malnutrition ([place])   . Acute blood loss anemia   . Myelopathy ([place]) 12/10/2017  . HNP (herniated nucleus pulposus) with myelopathy, cervical   . Essential hypertension   . Uncomplicated asthma   . Insulin resistance   . Hashimoto's disease   . Cervical spondylolysis   . Anxiety   . Hyponatremia 12/01/2017  . Hypothyroidism 12/01/2017   Ruben Im, PT 05/03/18 5:01 PM Phone: [phone] Fax: [phone]  Alvera Singh 05/03/2018, 5:01 PM  [facility] [address], [place], [place], [address] Phone: [phone]   Fax:  [phone]  Name: Regina [name] MRN: [id number] Date of Birth: [birth year]

## 2018-05-07 ENCOUNTER — Encounter: Payer: Self-pay | Admitting: Physical Therapy

## 2018-05-07 ENCOUNTER — Ambulatory Visit: Payer: Federal, State, Local not specified - PPO | Attending: Family Medicine | Admitting: Physical Therapy

## 2018-05-07 DIAGNOSIS — M6281 Muscle weakness (generalized): Secondary | ICD-10-CM | POA: Diagnosis present

## 2018-05-07 DIAGNOSIS — R262 Difficulty in walking, not elsewhere classified: Secondary | ICD-10-CM

## 2018-05-07 DIAGNOSIS — M5412 Radiculopathy, cervical region: Secondary | ICD-10-CM

## 2018-05-07 NOTE — Therapy (Signed)
[facility] [address] [place], [place], [address] Phone: [phone]   Fax:  [phone]  Physical Therapy Treatment/Recertification   Patient Details  Name: Regina [name] MRN: [id number] Date of Birth: [birth year] Referring Provider (PT): Dr. Maurice Small   Encounter Date: 05/07/2018  PT End of Session - 05/07/18 1244    Visit Number  12    Date for PT Re-Evaluation  07/02/18    Authorization Type  BCBS 50 visit limit    PT Start Time  1232    PT Stop Time  1315    PT Time Calculation (min)  43 min    Activity Tolerance  Patient tolerated treatment well       Past Medical History:  Diagnosis Date  . Anxiety   . Arthritis    neck  . Asthma   . Cervical spondylolysis   . Cervical spondylolysis   . Depression   . Hashimoto's disease   . Insulin resistance   . PONV (postoperative nausea and vomiting)    hasn't had surgery since 1993    Past Surgical History:  Procedure Laterality Date  . ANTERIOR CERVICAL DECOMP/DISCECTOMY FUSION N/A 12/06/2017   Procedure: ANTERIOR CERVICAL DECOMPRESSION/DISCECTOMY FUSION, CERVICAL 3- CERVICAL 4, CERVICAL 4- CERVICAL 5, CERVICAL 5- CERVICAL 6;  Surgeon: [name], MD;  Location: [facility];  Service: Neurosurgery;  Laterality: N/A;  ANTERIOR CERVICAL DECOMPRESSION/DISCECTOMY FUSION, CERVICAL THREE- CERVICAL FOUR, CERVICAL FOUR- CERVICAL FIVE, CERVICAL FIVE- CERVICAL SIX  . APPENDECTOMY    . BREAST SURGERY     left biopsy  . CESAREAN SECTION     x2  . CHOLECYSTECTOMY    . DENTAL SURGERY     teeth removal  . OVARY SURGERY     ovarian wedge  . TONSILLECTOMY      There were no vitals filed for this visit.  Subjective Assessment - 05/07/18 1232    Subjective  I'm a little tired.  I worked on Saturday and stood most of the time.  Last night I was up off and on with a cramp in my leg.  I didn't drink enough fluids.      Pertinent History  cervical  myelopathy  ACDF C3-6 12/06/17  "[name]"    How long can you walk comfortably?  with shopping cart a long time;  around the house OK 1 hour;  outside with cane more difficulty when unlevel;  1/2 block     Currently in Pain?  No/denies    Pain Score  0-No pain         [facility] PT Assessment - 05/07/18 0001      Observation/Other Assessments   Focus on Therapeutic Outcomes (FOTO)   32% limitation       Strength   Right Hand Grip (lbs)  25    Right Hand Lateral Pinch  10 lbs    Left Hand Grip (lbs)  23    Left Hand Lateral Pinch  8 lbs    Right Hip Flexion  4+/5    Right Hip Extension  4/5    Left Hip Flexion  4+/5    Left Hip Extension  4/5    Right Knee Flexion  4+/5    Right Knee Extension  4/5    Left Knee Flexion  4+/5    Left Knee Extension  4/5    Right Ankle Eversion  4+/5    Left Ankle Dorsiflexion  4/5    Left Ankle Plantar Flexion  4/5    Left Ankle Eversion  4/5      6 Minute Walk- Baseline   6 Minute Walk- Baseline  --   400 feet with SPC     Berg Balance Test   Sit to Stand  Able to stand without using hands and stabilize independently    Standing Unsupported  Able to stand safely 2 minutes    Sitting with Back Unsupported but Feet Supported on Floor or Stool  Able to sit safely and securely 2 minutes    Stand to Sit  Sits safely with minimal use of hands    Transfers  Able to transfer safely, minor use of hands    Standing Unsupported with Eyes Closed  Able to stand 10 seconds safely    Standing Ubsupported with Feet Together  Able to place feet together independently and stand 1 minute safely    From Standing, Reach Forward with Outstretched Arm  Can reach forward >12 cm safely (5")    From Standing Position, Pick up Object from Floor  Able to pick up shoe, needs supervision    From Standing Position, Turn to Look Behind Over each Shoulder  Looks behind one side only/other side shows less weight shift    Turn 360 Degrees  Able to turn 360 degrees safely but slowly     Standing Unsupported, Alternately Place Feet on Step/Stool  Able to stand independently and complete 8 steps >20 seconds    Standing Unsupported, One Foot in Front  Able to plae foot ahead of the other independently and hold 30 seconds    Standing on One Leg  Tries to lift leg/unable to hold 3 seconds but remains standing independently    Total Score  46      Timed Up and Go Test   Normal TUG (seconds)  17   no device                  [facility] Adult PT Treatment/Exercise - 05/07/18 0001      Therapeutic Activites    Work Simulation  up and down steps 2x with 1 railing one step at a time for safety       Neuro Re-ed    Neuro Re-ed Details   standing on foam with 3# weight pass, shoulder to shoulder, hip to hip, steering wheel, press foward       Neck Exercises: Standing   Other Standing Exercises  carrying 3# weight while maneuvering around obstables and stepping over obstacles       Knee/Hip Exercises: Aerobic   Nustep  L3 10 min seat 5                PT Short Term Goals - 05/07/18 1255      PT SHORT TERM GOAL #1   Title  The patient will have improved gait speed with appropriate assistive device with TUG to 17 sec    Status  Achieved      PT SHORT TERM GOAL #2   Title  BERG balance score improved to 40/56 indicating improved dynamic balance and decreased risk for falls    Status  Achieved      PT SHORT TERM GOAL #3   Title  The patient will have improved core and proximal UE strength needed to transfer from supine to sitting independently    Status  Achieved      PT SHORT TERM GOAL #4   Title  Improved LE strength as evident with  improved 5x sit to stand test in 20 sec or less    Status  Achieved        PT Long Term Goals - 05/07/18 1242      PT LONG TERM GOAL #1   Title  The patient will be independent with HEP needed for further improvements in mobility, balance and strength    Time  8    Period  Weeks    Status  On-going    Target Date   07/02/18      PT LONG TERM GOAL #2   Title  The patient will have improved BERG balance test to 48/56 indicating improved gait safety and balance    Time  8    Period  Weeks    Status  Revised      PT LONG TERM GOAL #3   Title  The patient will have improved grip strength to 30# and pincher squeeze to 8# needed for work tasks    Time  8    Period  Weeks    Status  On-going      PT LONG TERM GOAL #4   Title  The patient will be able to ambulate with appropriate assistive device 500 feet on paved level surfaces without loss of balance     Status  Achieved      PT LONG TERM GOAL #5   Title  FOTO functional outcome score improved from 46% limitation to 39% indicating improved function with less pain     Status  Achieved      Additional Long Term Goals   Additional Long Term Goals  Yes      PT LONG TERM GOAL #6   Title  The patient will be able to carry a 5# grocery bag 75 feet needed to unload her car after shopping    Time  8    Period  Weeks    Status  New      PT LONG TERM GOAL #7   Title  The patient will have improved UE /LE strength to be able to get on and off the floor    Time  8    Period  Weeks    Status  New      PT LONG TERM GOAL #8   Title  The patient be have improved balance and strength needed to ambulate up and down stairs with ease and ambulate in her yard with her cane    Time  8    Period  Weeks    Status  New            Plan - 05/07/18 2130    Clinical Impression Statement  The patient has made a significant improvement in BERG balance test by 5 points however she is still at moderate risk for falls and recommend she continue with use of her cane on a full time basis.  Her gait speed with Timed up and Go test has also improved without use of cane.  She is improving with return to function however she is limited with functional tasks including carrying grocery bags, walking outside, going up and down stairs as need for her job at the [employer] and  being able to get on/off the floor.  She would benefit from additional PT needed to address these remaining deficits.      Rehab Potential  Good    Clinical Impairments Affecting Rehab Potential  Multi-level cervical fusion 12/06/17    PT Frequency  2x / week    PT Duration  8 weeks    PT Treatment/Interventions  ADLs/Self Care Home Management;Electrical Stimulation;Cryotherapy;Moist Heat;Therapeutic exercise;Therapeutic activities;Neuromuscular re-education;Patient/family education;Manual techniques;Balance training    PT Next Visit Plan  stairs, gait on outdoor terrain; on/off the floor, carrying objects while ambulating;  balance, LE and UE strengthening     PT Home Exercise Plan  [id number]       Patient will benefit from skilled therapeutic intervention in order to improve the following deficits and impairments:  Abnormal gait, Pain, Decreased activity tolerance, Decreased range of motion, Decreased strength, Impaired perceived functional ability, Difficulty walking, Decreased balance  Visit Diagnosis: Muscle weakness (generalized) - Plan: PT plan of care cert/re-cert  Difficulty in walking, not elsewhere classified - Plan: PT plan of care cert/re-cert  Radiculopathy, cervical region - Plan: PT plan of care cert/re-cert     Problem List Patient Active Problem List   Diagnosis Date Noted  . Leukocytosis   . Hypokalemia   . Labile blood pressure   . Benign essential HTN   . Postoperative pain   . Hypertensive crisis   . 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 Hypoalbuminemia due to protein-calorie malnutrition ([place])   . Acute blood loss anemia   . Myelopathy ([place]) 12/10/2017  . HNP (herniated nucleus pulposus) with myelopathy, cervical   . Essential hypertension   . Uncomplicated asthma   . Insulin resistance   . Hashimoto's disease   . Cervical spondylolysis   . Anxiety   . Hyponatremia 12/01/2017  . Hypothyroidism 12/01/2017   Ruben Im, PT 05/07/18 9:43  PM Phone: [phone] Fax: [phone] Alvera Singh 05/07/2018, 9:42 PM  [facility] [address] [place], [place], [address] Phone: [phone]   Fax:  [phone]  Name: Regina [name] MRN: [id number] Date of Birth: [birth year]

## 2018-05-14 ENCOUNTER — Encounter: Payer: Self-pay | Admitting: Physical Therapy

## 2018-05-14 ENCOUNTER — Ambulatory Visit: Payer: Federal, State, Local not specified - PPO | Admitting: Physical Therapy

## 2018-05-14 DIAGNOSIS — M5412 Radiculopathy, cervical region: Secondary | ICD-10-CM

## 2018-05-14 DIAGNOSIS — M6281 Muscle weakness (generalized): Secondary | ICD-10-CM | POA: Diagnosis not present

## 2018-05-14 DIAGNOSIS — R262 Difficulty in walking, not elsewhere classified: Secondary | ICD-10-CM

## 2018-05-14 NOTE — Therapy (Signed)
[facility] [address] [place], [place], [address] Phone: [phone]   Fax:  [phone]  Physical Therapy Treatment  Patient Details  Name: Regina [name] MRN: [id number] Date of Birth: [birth year] Referring Provider (PT): Dr. Maurice Small   Encounter Date: 05/14/2018  PT End of Session - 05/14/18 1456    Visit Number  13    Date for PT Re-Evaluation  07/02/18    Authorization Type  BCBS 50 visit limit    PT Start Time  3329    PT Stop Time  1529    PT Time Calculation (min)  42 min    Activity Tolerance  Patient tolerated treatment well       Past Medical History:  Diagnosis Date  . Anxiety   . Arthritis    neck  . Asthma   . Cervical spondylolysis   . Cervical spondylolysis   . Depression   . Hashimoto's disease   . Insulin resistance   . PONV (postoperative nausea and vomiting)    hasn't had surgery since 1993    Past Surgical History:  Procedure Laterality Date  . ANTERIOR CERVICAL DECOMP/DISCECTOMY FUSION N/A 12/06/2017   Procedure: ANTERIOR CERVICAL DECOMPRESSION/DISCECTOMY FUSION, CERVICAL 3- CERVICAL 4, CERVICAL 4- CERVICAL 5, CERVICAL 5- CERVICAL 6;  Surgeon: [name], MD;  Location: [facility];  Service: Neurosurgery;  Laterality: N/A;  ANTERIOR CERVICAL DECOMPRESSION/DISCECTOMY FUSION, CERVICAL THREE- CERVICAL FOUR, CERVICAL FOUR- CERVICAL FIVE, CERVICAL FIVE- CERVICAL SIX  . APPENDECTOMY    . BREAST SURGERY     left biopsy  . CESAREAN SECTION     x2  . CHOLECYSTECTOMY    . DENTAL SURGERY     teeth removal  . OVARY SURGERY     ovarian wedge  . TONSILLECTOMY      There were no vitals filed for this visit.  Subjective Assessment - 05/14/18 1450    Subjective  New grandbaby was born.  I've been doing more around the house.  Balance is not bad but not good.      Pertinent History  cervical myelopathy  ACDF C3-6 12/06/17  "[name]"    Limitations  House hold  activities;Walking;Standing    How long can you walk comfortably?  with shopping cart a long time;  around the house OK 1 hour;  outside with cane more difficulty when unlevel;  1/2 block     Patient Stated Goals  not constantly worry about falling;  get balance back;  get back to work at the [employer] end of January     Currently in Pain?  No/denies    Pain Score  0-No pain    Pain Type  Chronic pain                       [facility] Adult PT Treatment/Exercise - 05/14/18 0001      Therapeutic Activites    Work Simulation  up and down steps 2x with 1 railing one step at a time for safety carrying 3# weight; placing weight into lowest cabinet shelf, cones into higher shelves       Neuro Re-ed    Neuro Re-ed Details   moderate balance challenges with reaching; side stepping , stepping forward and back, side step over the line holding weighted object       Knee/Hip Exercises: Aerobic   Nustep  L2 10 min seat 5       Shoulder Exercises: Standing   Other  Standing Exercises  crawling on off mat table into quadruped position    Other Standing Exercises  WB through UEs on mat table push ups, alternating shoulder touch, heel raises to increase WB on hands               PT Short Term Goals - 05/07/18 1255      PT SHORT TERM GOAL #1   Title  The patient will have improved gait speed with appropriate assistive device with TUG to 17 sec    Status  Achieved      PT SHORT TERM GOAL #2   Title  BERG balance score improved to 40/56 indicating improved dynamic balance and decreased risk for falls    Status  Achieved      PT SHORT TERM GOAL #3   Title  The patient will have improved core and proximal UE strength needed to transfer from supine to sitting independently    Status  Achieved      PT SHORT TERM GOAL #4   Title  Improved LE strength as evident with improved 5x sit to stand test in 20 sec or less    Status  Achieved        PT Long Term Goals - 05/07/18 1242       PT LONG TERM GOAL #1   Title  The patient will be independent with HEP needed for further improvements in mobility, balance and strength    Time  8    Period  Weeks    Status  On-going    Target Date  07/02/18      PT LONG TERM GOAL #2   Title  The patient will have improved BERG balance test to 48/56 indicating improved gait safety and balance    Time  8    Period  Weeks    Status  Revised      PT LONG TERM GOAL #3   Title  The patient will have improved grip strength to 30# and pincher squeeze to 8# needed for work tasks    Time  8    Period  Weeks    Status  On-going      PT LONG TERM GOAL #4   Title  The patient will be able to ambulate with appropriate assistive device 500 feet on paved level surfaces without loss of balance     Status  Achieved      PT LONG TERM GOAL #5   Title  FOTO functional outcome score improved from 46% limitation to 39% indicating improved function with less pain     Status  Achieved      Additional Long Term Goals   Additional Long Term Goals  Yes      PT LONG TERM GOAL #6   Title  The patient will be able to carry a 5# grocery bag 75 feet needed to unload her car after shopping    Time  8    Period  Weeks    Status  New      PT LONG TERM GOAL #7   Title  The patient will have improved UE /LE strength to be able to get on and off the floor    Time  8    Period  Weeks    Status  New      PT LONG TERM GOAL #8   Title  The patient be have improved balance and strength needed to ambulate up and down stairs with ease  and ambulate in her yard with her cane    Time  8    Period  Weeks    Status  New            Plan - 05/14/18 1456    Clinical Impression Statement  The patient continues to make steady progress with return to function and balance.  She is able to perform moderate challenges to her balance with CGA but no physical assistance needed today for loss of balance.  She is able to carry a 3# weight while walking short distances  without her cane.  Improving UE strength including strength to accept some body weight needed to help get up off the floor (one of patient's goals).  Therapist closely monitoring response with all.      Rehab Potential  Good    Clinical Impairments Affecting Rehab Potential  Multi-level cervical fusion 12/06/17    PT Frequency  2x / week    PT Duration  8 weeks    PT Treatment/Interventions  ADLs/Self Care Home Management;Electrical Stimulation;Cryotherapy;Moist Heat;Therapeutic exercise;Therapeutic activities;Neuromuscular re-education;Patient/family education;Manual techniques;Balance training    PT Next Visit Plan  stairs, gait on outdoor terrain; on/off the floor, carrying objects while ambulating;  balance, LE and UE strengthening     PT Home Exercise Plan  [id number]       Patient will benefit from skilled therapeutic intervention in order to improve the following deficits and impairments:  Abnormal gait, Pain, Decreased activity tolerance, Decreased range of motion, Decreased strength, Impaired perceived functional ability, Difficulty walking, Decreased balance  Visit Diagnosis: Muscle weakness (generalized)  Difficulty in walking, not elsewhere classified  Radiculopathy, cervical region     Problem List Patient Active Problem List   Diagnosis Date Noted  . Leukocytosis   . Hypokalemia   . Labile blood pressure   . Benign essential HTN   . Postoperative pain   . Hypertensive crisis   . 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 Hypoalbuminemia due to protein-calorie malnutrition ([place])   . Acute blood loss anemia   . Myelopathy ([place]) 12/10/2017  . HNP (herniated nucleus pulposus) with myelopathy, cervical   . Essential hypertension   . Uncomplicated asthma   . Insulin resistance   . Hashimoto's disease   . Cervical spondylolysis   . Anxiety   . Hyponatremia 12/01/2017  . Hypothyroidism 12/01/2017   Ruben Im, PT 05/14/18 8:53 PM Phone: [phone] Fax:  [phone]  Alvera Singh 05/14/2018, 8:53 PM  [facility] [address], [place], [place], [address] Phone: [phone]   Fax:  [phone]  Name: GICELA SCHWARTING MRN: [id number] Date of Birth: 20-Dec-1940

## 2018-05-16 ENCOUNTER — Encounter

## 2018-05-16 ENCOUNTER — Encounter: Payer: Federal, State, Local not specified - PPO | Admitting: Physical Therapy

## 2018-05-21 ENCOUNTER — Ambulatory Visit: Payer: Federal, State, Local not specified - PPO | Admitting: Physical Therapy

## 2018-05-21 ENCOUNTER — Encounter: Payer: Self-pay | Admitting: Physical Therapy

## 2018-05-21 DIAGNOSIS — M6281 Muscle weakness (generalized): Secondary | ICD-10-CM | POA: Diagnosis not present

## 2018-05-21 DIAGNOSIS — M5412 Radiculopathy, cervical region: Secondary | ICD-10-CM

## 2018-05-21 DIAGNOSIS — R262 Difficulty in walking, not elsewhere classified: Secondary | ICD-10-CM

## 2018-05-21 NOTE — Therapy (Signed)
[facility] [address] [place], [place], [address] Phone: [phone]   Fax:  [phone]  Physical Therapy Treatment  Patient Details  Name: Regina [name] MRN: [id number] Date of Birth: 02-Sep-1940 Referring Provider (PT): Dr. Maurice Small   Encounter Date: 05/21/2018  PT End of Session - 05/21/18 1234    Visit Number  14    Date for PT Re-Evaluation  07/02/18    Authorization Type  BCBS 50 visit limit    PT Start Time  1230    PT Stop Time  1310    PT Time Calculation (min)  40 min    Activity Tolerance  Patient tolerated treatment well       Past Medical History:  Diagnosis Date  . Anxiety   . Arthritis    neck  . Asthma   . Cervical spondylolysis   . Cervical spondylolysis   . Depression   . Hashimoto's disease   . Insulin resistance   . PONV (postoperative nausea and vomiting)    hasn't had surgery since 1993    Past Surgical History:  Procedure Laterality Date  . ANTERIOR CERVICAL DECOMP/DISCECTOMY FUSION N/A 12/06/2017   Procedure: ANTERIOR CERVICAL DECOMPRESSION/DISCECTOMY FUSION, CERVICAL 3- CERVICAL 4, CERVICAL 4- CERVICAL 5, CERVICAL 5- CERVICAL 6;  Surgeon: [name], MD;  Location: [facility];  Service: Neurosurgery;  Laterality: N/A;  ANTERIOR CERVICAL DECOMPRESSION/DISCECTOMY FUSION, CERVICAL THREE- CERVICAL FOUR, CERVICAL FOUR- CERVICAL FIVE, CERVICAL FIVE- CERVICAL SIX  . APPENDECTOMY    . BREAST SURGERY     left biopsy  . CESAREAN SECTION     x2  . CHOLECYSTECTOMY    . DENTAL SURGERY     teeth removal  . OVARY SURGERY     ovarian wedge  . TONSILLECTOMY      There were no vitals filed for this visit.  Subjective Assessment - 05/21/18 1230    Subjective  Presents with SPC.  Balance is "not bad."  I will work tomorrow for 3 hours.  I've been busy around the house putting things away.  I can shower standing now.  I don't get the leg cramps like I used to.      Pertinent History  cervical myelopathy  ACDF C3-6 12/06/17  "[name]"    Limitations  House hold activities;Walking;Standing    How long can you walk comfortably?  with shopping cart a long time;  around the house OK 1 hour;  outside with cane more difficulty when unlevel;  1/2 block     Patient Stated Goals  not constantly worry about falling;  get balance back;  get back to work at the [employer] end of January     Currently in Pain?  No/denies    Pain Score  0-No pain                       [facility] Adult PT Treatment/Exercise - 05/21/18 0001      Therapeutic Activites    Work Simulation  up and down steps with 1 railing 3x       Neuro Re-ed    Neuro Re-ed Details   moderate challenge with red band Pallof press on 2 legs 5x each       Neck Exercises: Machines for Passenger transport manager for Strengthening  Leg press seat 5 70# bil 15x; 40# single leg 15x right/left       Knee/Hip Exercises: Stretches   Hip Best boy  Right;Left;5 reps    Hip Flexor Stretch Limitations  on 2nd step      Knee/Hip Exercises: Aerobic   Nustep  L2 10 min seat 5       Knee/Hip Exercises: Standing   Walking with Sports Cord  20# resisted walk backward 5x with close GCA for safety     Other Standing Knee Exercises  weight shifting then heel raise and lowers 5x each way                PT Short Term Goals - 05/07/18 1255      PT SHORT TERM GOAL #1   Title  The patient will have improved gait speed with appropriate assistive device with TUG to 17 sec    Status  Achieved      PT SHORT TERM GOAL #2   Title  BERG balance score improved to 40/56 indicating improved dynamic balance and decreased risk for falls    Status  Achieved      PT SHORT TERM GOAL #3   Title  The patient will have improved core and proximal UE strength needed to transfer from supine to sitting independently    Status  Achieved      PT SHORT TERM GOAL #4   Title  Improved LE strength as evident with  improved 5x sit to stand test in 20 sec or less    Status  Achieved        PT Long Term Goals - 05/07/18 1242      PT LONG TERM GOAL #1   Title  The patient will be independent with HEP needed for further improvements in mobility, balance and strength    Time  8    Period  Weeks    Status  On-going    Target Date  07/02/18      PT LONG TERM GOAL #2   Title  The patient will have improved BERG balance test to 48/56 indicating improved gait safety and balance    Time  8    Period  Weeks    Status  Revised      PT LONG TERM GOAL #3   Title  The patient will have improved grip strength to 30# and pincher squeeze to 8# needed for work tasks    Time  8    Period  Weeks    Status  On-going      PT LONG TERM GOAL #4   Title  The patient will be able to ambulate with appropriate assistive device 500 feet on paved level surfaces without loss of balance     Status  Achieved      PT LONG TERM GOAL #5   Title  FOTO functional outcome score improved from 46% limitation to 39% indicating improved function with less pain     Status  Achieved      Additional Long Term Goals   Additional Long Term Goals  Yes      PT LONG TERM GOAL #6   Title  The patient will be able to carry a 5# grocery bag 75 feet needed to unload her car after shopping    Time  8    Period  Weeks    Status  New      PT LONG TERM GOAL #7   Title  The patient will have improved UE /LE strength to be able to get on and off the floor    Time  8  Period  Weeks    Status  New      PT LONG TERM GOAL #8   Title  The patient be have improved balance and strength needed to ambulate up and down stairs with ease and ambulate in her yard with her cane    Time  8    Period  Weeks    Status  New            Plan - 05/21/18 1234    Clinical Impression Statement  The patient ambulates in the clinic without her cane but needs min assist at times secondary to unsteadiness.  We discussed that she needs to continue  using her cane at home and in the community secondary to balance deficits.  She has difficulty with stepping backwards and compensates with short shuffled steps.  Improving overall LE strength and endurance.  Therapist closely monitoring response and close supervision for safety.      Rehab Potential  Good    Clinical Impairments Affecting Rehab Potential  Multi-level cervical fusion 12/06/17    PT Frequency  2x / week    PT Duration  8 weeks    PT Treatment/Interventions  ADLs/Self Care Home Management;Electrical Stimulation;Cryotherapy;Moist Heat;Therapeutic exercise;Therapeutic activities;Neuromuscular re-education;Patient/family education;Manual techniques;Balance training    PT Next Visit Plan  stairs, gait on outdoor terrain; on/off the floor, carrying objects while ambulating;  balance, LE and UE strengthening     PT Home Exercise Plan  [id number]       Patient will benefit from skilled therapeutic intervention in order to improve the following deficits and impairments:  Abnormal gait, Pain, Decreased activity tolerance, Decreased range of motion, Decreased strength, Impaired perceived functional ability, Difficulty walking, Decreased balance  Visit Diagnosis: Muscle weakness (generalized)  Difficulty in walking, not elsewhere classified  Radiculopathy, cervical region     Problem List Patient Active Problem List   Diagnosis Date Noted  . Leukocytosis   . Hypokalemia   . Labile blood pressure   . Benign essential HTN   . Postoperative pain   . Hypertensive crisis   . 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 Hypoalbuminemia due to protein-calorie malnutrition ([place])   . Acute blood loss anemia   . Myelopathy ([place]) 12/10/2017  . HNP (herniated nucleus pulposus) with myelopathy, cervical   . Essential hypertension   . Uncomplicated asthma   . Insulin resistance   . Hashimoto's disease   . Cervical spondylolysis   . Anxiety   . Hyponatremia 12/01/2017  . Hypothyroidism  12/01/2017   Ruben Im, PT 05/21/18 5:25 PM Phone: [phone] Fax: [phone]  Alvera Singh 05/21/2018, 5:25 PM  [facility] [address], [place], [place], [address] Phone: [phone]   Fax:  [phone]  Name: Regina [name] MRN: [id number] Date of Birth: 04-04-1940

## 2018-05-23 ENCOUNTER — Encounter: Payer: Self-pay | Admitting: Physical Therapy

## 2018-05-23 ENCOUNTER — Ambulatory Visit: Payer: Federal, State, Local not specified - PPO | Admitting: Physical Therapy

## 2018-05-23 DIAGNOSIS — M6281 Muscle weakness (generalized): Secondary | ICD-10-CM | POA: Diagnosis not present

## 2018-05-23 DIAGNOSIS — M5412 Radiculopathy, cervical region: Secondary | ICD-10-CM

## 2018-05-23 DIAGNOSIS — R262 Difficulty in walking, not elsewhere classified: Secondary | ICD-10-CM

## 2018-05-23 NOTE — Therapy (Signed)
[facility] [address] [place], [place], [address] Phone: [phone]   Fax:  [phone]  Physical Therapy Treatment  Patient Details  Name: Regina [name] MRN: [id number] Date of Birth: 09-02-1940 Referring Provider (PT): Dr. Maurice Small   Encounter Date: 05/23/2018  PT End of Session - 05/23/18 1357    Visit Number  15    Date for PT Re-Evaluation  07/02/18    Authorization Type  BCBS 50 visit limit    PT Start Time  1230    PT Stop Time  1315    PT Time Calculation (min)  45 min    Activity Tolerance  Patient tolerated treatment well;No increased pain       Past Medical History:  Diagnosis Date  . Anxiety   . Arthritis    neck  . Asthma   . Cervical spondylolysis   . Cervical spondylolysis   . Depression   . Hashimoto's disease   . Insulin resistance   . PONV (postoperative nausea and vomiting)    hasn't had surgery since 1993    Past Surgical History:  Procedure Laterality Date  . ANTERIOR CERVICAL DECOMP/DISCECTOMY FUSION N/A 12/06/2017   Procedure: ANTERIOR CERVICAL DECOMPRESSION/DISCECTOMY FUSION, CERVICAL 3- CERVICAL 4, CERVICAL 4- CERVICAL 5, CERVICAL 5- CERVICAL 6;  Surgeon: [name], MD;  Location: [facility];  Service: Neurosurgery;  Laterality: N/A;  ANTERIOR CERVICAL DECOMPRESSION/DISCECTOMY FUSION, CERVICAL THREE- CERVICAL FOUR, CERVICAL FOUR- CERVICAL FIVE, CERVICAL FIVE- CERVICAL SIX  . APPENDECTOMY    . BREAST SURGERY     left biopsy  . CESAREAN SECTION     x2  . CHOLECYSTECTOMY    . DENTAL SURGERY     teeth removal  . OVARY SURGERY     ovarian wedge  . TONSILLECTOMY      There were no vitals filed for this visit.  Subjective Assessment - 05/23/18 1233    Subjective  Pt reports that work was good. She was able to stand for 2.5 hours without a break.     Pertinent History  cervical myelopathy  ACDF C3-6 12/06/17  "[name]"    Limitations  House hold  activities;Walking;Standing    How long can you walk comfortably?  with shopping cart a long time;  around the house OK 1 hour;  outside with cane more difficulty when unlevel;  1/2 block     Patient Stated Goals  not constantly worry about falling;  get balance back;  get back to work at the [employer] end of January     Currently in Pain?  No/denies           [facility] Adult PT Treatment/Exercise - 05/23/18 0001      Neck Exercises: Supine   Neck Retraction  15 reps    Neck Retraction Limitations  tactile/verbal cues     Cervical Rotation  15 reps;Left;Right    Cervical Rotation Limitations  pain free range    Other Supine Exercise  --      Knee/Hip Exercises: Seated   Sit to Sand  1 set;10 reps   UE support to stand, on foam mat          Balance Exercises - 05/23/18 1240      Balance Exercises: Standing   Tandem Stance  Eyes open;2 reps   pallof press with yellow TB x10 reps each direction   SLS with Vectors  Solid surface;Upper extremity assist 1;3 reps;10 secs    Standing, One Foot  on a Step  Eyes open;Eyes closed;Foam/compliant surface;6 inch;4 reps   last set with eyes closed    Other Standing Exercises  alternating LE step tap         PT Education - 05/23/18 1357    Education Details  technique with therex/activity    Person(s) Educated  Patient    Methods  Explanation;Verbal cues    Comprehension  Verbalized understanding;Returned demonstration       PT Short Term Goals - 05/07/18 1255      PT SHORT TERM GOAL #1   Title  The patient will have improved gait speed with appropriate assistive device with TUG to 17 sec    Status  Achieved      PT SHORT TERM GOAL #2   Title  BERG balance score improved to 40/56 indicating improved dynamic balance and decreased risk for falls    Status  Achieved      PT SHORT TERM GOAL #3   Title  The patient will have improved core and proximal UE strength needed to transfer from supine to sitting independently    Status   Achieved      PT SHORT TERM GOAL #4   Title  Improved LE strength as evident with improved 5x sit to stand test in 20 sec or less    Status  Achieved        PT Long Term Goals - 05/07/18 1242      PT LONG TERM GOAL #1   Title  The patient will be independent with HEP needed for further improvements in mobility, balance and strength    Time  8    Period  Weeks    Status  On-going    Target Date  07/02/18      PT LONG TERM GOAL #2   Title  The patient will have improved BERG balance test to 48/56 indicating improved gait safety and balance    Time  8    Period  Weeks    Status  Revised      PT LONG TERM GOAL #3   Title  The patient will have improved grip strength to 30# and pincher squeeze to 8# needed for work tasks    Time  8    Period  Weeks    Status  On-going      PT LONG TERM GOAL #4   Title  The patient will be able to ambulate with appropriate assistive device 500 feet on paved level surfaces without loss of balance     Status  Achieved      PT LONG TERM GOAL #5   Title  FOTO functional outcome score improved from 46% limitation to 39% indicating improved function with less pain     Status  Achieved      Additional Long Term Goals   Additional Long Term Goals  Yes      PT LONG TERM GOAL #6   Title  The patient will be able to carry a 5# grocery bag 75 feet needed to unload her car after shopping    Time  8    Period  Weeks    Status  New      PT LONG TERM GOAL #7   Title  The patient will have improved UE /LE strength to be able to get on and off the floor    Time  8    Period  Weeks    Status  New  PT LONG TERM GOAL #8   Title  The patient be have improved balance and strength needed to ambulate up and down stairs with ease and ambulate in her yard with her cane    Time  8    Period  Weeks    Status  New            Plan - 05/23/18 1358    Clinical Impression Statement  Today's session focused on activity to improve static balance. Pt was  able to attempt single leg stance with varied levels of difficulty, although she did require intermittent UE support to prevent LOB. Pt was able to complete up to 2.5 hours of standing at her workplace yesterday, which she felt was a great improvement from past shifts. Will continue with PT POC to promote improvements in LE strength, proprioception and activity participation.     Rehab Potential  Good    Clinical Impairments Affecting Rehab Potential  Multi-level cervical fusion 12/06/17    PT Frequency  2x / week    PT Duration  8 weeks    PT Treatment/Interventions  ADLs/Self Care Home Management;Electrical Stimulation;Cryotherapy;Moist Heat;Therapeutic exercise;Therapeutic activities;Neuromuscular re-education;Patient/family education;Manual techniques;Balance training    PT Next Visit Plan  stairs, gait on outdoor terrain; on/off the floor, carrying objects while ambulating;  balance, LE and UE strengthening     PT Home Exercise Plan  [id number]       Patient will benefit from skilled therapeutic intervention in order to improve the following deficits and impairments:  Abnormal gait, Pain, Decreased activity tolerance, Decreased range of motion, Decreased strength, Impaired perceived functional ability, Difficulty walking, Decreased balance  Visit Diagnosis: Muscle weakness (generalized)  Difficulty in walking, not elsewhere classified  Radiculopathy, cervical region     Problem List Patient Active Problem List   Diagnosis Date Noted  . Leukocytosis   . Hypokalemia   . Labile blood pressure   . Benign essential HTN   . Postoperative pain   . Hypertensive crisis   . 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 Hypoalbuminemia due to protein-calorie malnutrition ([place])   . Acute blood loss anemia   . Myelopathy ([place]) 12/10/2017  . HNP (herniated nucleus pulposus) with myelopathy, cervical   . Essential hypertension   . Uncomplicated asthma   . Insulin resistance   . Hashimoto's  disease   . Cervical spondylolysis   . Anxiety   . Hyponatremia 12/01/2017  . Hypothyroidism 12/01/2017    4:12 PM,05/23/18 Sherol Dade PT, DPT [facility] [address] [place], [place], [address] Phone: [phone]   Fax:  [phone]  Name: FYNLEY CHRYSTAL MRN: [id number] Date of Birth: 1940/06/22

## 2018-05-28 ENCOUNTER — Encounter: Payer: Self-pay | Admitting: Physical Therapy

## 2018-05-28 ENCOUNTER — Ambulatory Visit: Payer: Federal, State, Local not specified - PPO | Admitting: Physical Therapy

## 2018-05-28 DIAGNOSIS — M6281 Muscle weakness (generalized): Secondary | ICD-10-CM | POA: Diagnosis not present

## 2018-05-28 DIAGNOSIS — R262 Difficulty in walking, not elsewhere classified: Secondary | ICD-10-CM

## 2018-05-28 DIAGNOSIS — M5412 Radiculopathy, cervical region: Secondary | ICD-10-CM

## 2018-05-28 NOTE — Therapy (Signed)
[facility] [address], [place], [place], [address] Phone: [phone]   Fax:  [phone]  Physical Therapy Treatment  Patient Details  Name: Regina [name] MRN: [id number] Date of Birth: Mar 23, 1941 Referring Provider (PT): Dr. Maurice Small   Encounter Date: 05/28/2018  PT End of Session - 05/28/18 1258    Visit Number  16    Date for PT Re-Evaluation  07/02/18    Authorization Type  BCBS 50 visit limit    PT Start Time  1230    PT Stop Time  1310    PT Time Calculation (min)  40 min    Activity Tolerance  Patient tolerated treatment well       Past Medical History:  Diagnosis Date  . Anxiety   . Arthritis    neck  . Asthma   . Cervical spondylolysis   . Cervical spondylolysis   . Depression   . Hashimoto's disease   . Insulin resistance   . PONV (postoperative nausea and vomiting)    hasn't had surgery since 1993    Past Surgical History:  Procedure Laterality Date  . ANTERIOR CERVICAL DECOMP/DISCECTOMY FUSION N/A 12/06/2017   Procedure: ANTERIOR CERVICAL DECOMPRESSION/DISCECTOMY FUSION, CERVICAL 3- CERVICAL 4, CERVICAL 4- CERVICAL 5, CERVICAL 5- CERVICAL 6;  Surgeon: [name], MD;  Location: [facility];  Service: Neurosurgery;  Laterality: N/A;  ANTERIOR CERVICAL DECOMPRESSION/DISCECTOMY FUSION, CERVICAL THREE- CERVICAL FOUR, CERVICAL FOUR- CERVICAL FIVE, CERVICAL FIVE- CERVICAL SIX  . APPENDECTOMY    . BREAST SURGERY     left biopsy  . CESAREAN SECTION     x2  . CHOLECYSTECTOMY    . DENTAL SURGERY     teeth removal  . OVARY SURGERY     ovarian wedge  . TONSILLECTOMY      There were no vitals filed for this visit.  Subjective Assessment - 05/28/18 1229    Subjective  I've been working around the house.  I work next week at [employer] for a long shift.  A little of pain in my neck, maybe I slept wrong.  I can put 2 or 3 dishes up at a time now.      Pertinent History  cervical  myelopathy  ACDF C3-6 12/06/17  "[name]"    Currently in Pain?  Yes    Pain Score  2     Pain Location  Neck         [facility] PT Assessment - 05/28/18 0001      Strength   Right Hand Grip (lbs)  20    Right Hand Lateral Pinch  10 lbs    Left Hand Grip (lbs)  20    Left Hand Lateral Pinch  8 lbs      6 Minute Walk- Baseline   6 Minute Walk- Baseline  --   550 feet with SPC                  [facility] Adult PT Treatment/Exercise - 05/28/18 0001      Self-Care   Self-Care  Other Self-Care Comments    Other Self-Care Comments   discussed community based ex programs available including PREP program and [employer]       Therapeutic Activites    ADL's  getting up and down off the floor    Work Simulation  carrying 5# weight 100 feet and up and down stairs       Neck Exercises: Machines for Strengthening  Other Machines for Strengthening  Leg press seat 5 70# bil 15x; 40# single leg 15x right/left       Knee/Hip Exercises: Aerobic   Nustep  L2 10 min seat 5                PT Short Term Goals - 05/07/18 1255      PT SHORT TERM GOAL #1   Title  The patient will have improved gait speed with appropriate assistive device with TUG to 17 sec    Status  Achieved      PT SHORT TERM GOAL #2   Title  BERG balance score improved to 40/56 indicating improved dynamic balance and decreased risk for falls    Status  Achieved      PT SHORT TERM GOAL #3   Title  The patient will have improved core and proximal UE strength needed to transfer from supine to sitting independently    Status  Achieved      PT SHORT TERM GOAL #4   Title  Improved LE strength as evident with improved 5x sit to stand test in 20 sec or less    Status  Achieved        PT Long Term Goals - 05/28/18 1307      PT LONG TERM GOAL #1   Title  The patient will be independent with HEP needed for further improvements in mobility, balance and strength    Time  8    Period  Weeks    Status  On-going       PT LONG TERM GOAL #2   Title  The patient will have improved BERG balance test to 48/56 indicating improved gait safety and balance    Time  8    Period  Weeks    Status  On-going      PT LONG TERM GOAL #3   Title  The patient will have improved grip strength to 30# and pincher squeeze to 8# needed for work tasks    Time  8    Period  Weeks    Status  On-going      PT LONG TERM GOAL #4   Title  The patient will be able to ambulate with appropriate assistive device 500 feet on paved level surfaces without loss of balance     Status  Achieved      PT LONG TERM GOAL #5   Title  FOTO functional outcome score improved from 46% limitation to 39% indicating improved function with less pain     Status  Achieved      PT LONG TERM GOAL #6   Title  The patient will be able to carry a 5# grocery bag 75 feet needed to unload her car after shopping    Status  Achieved      PT LONG TERM GOAL #7   Title  The patient will have improved UE /LE strength to be able to get on and off the floor    Status  Achieved      PT LONG TERM GOAL #8   Period  Weeks    Status  On-going            Plan - 05/28/18 1259    Clinical Impression Statement  My right leg gets more tired than my left with walking.  The patient has much improved 6 min walk test distance to 550 feet using her cane and without loss of balance.  She has  met personal goals of being able to carry a 5# object and she is able to get down and back up off the floor unassisted.  No change in grip or pincher strength.  We have been unable to walk outdoors secondary to rainy weather on her last several visits.    [facility] was [facility].  Therapist providing supervision for safety but no loss of balance today or physical assist needed.      Rehab Potential  Good    Clinical Impairments Affecting Rehab Potential  Multi-level cervical fusion 12/06/17    PT  Frequency  2x / week    PT Duration  8 weeks    PT Treatment/Interventions  ADLs/Self Care Home Management;Electrical Stimulation;Cryotherapy;Moist Heat;Therapeutic exercise;Therapeutic activities;Neuromuscular re-education;Patient/family education;Manual techniques;Balance training    PT Next Visit Plan  Recheck  TUG, BERG;  MMT;   Patient may be ready for discharge next visit if long term goals met or within next 2-3 visits;   FOTO if discharge;  [place]  [id number]    Recommended Other Services  Patient requests referral to PREP program        Patient will benefit from skilled therapeutic intervention in order to improve the following deficits and impairments:  Abnormal gait, Pain, Decreased activity tolerance, Decreased range of motion, Decreased strength, Impaired perceived functional ability, Difficulty walking, Decreased balance  Visit Diagnosis: Muscle weakness (generalized)  Difficulty in walking, not elsewhere classified  Radiculopathy, cervical region     Problem List Patient Active Problem List   Diagnosis Date Noted  . Leukocytosis   . Hypokalemia   . Labile blood pressure   . Benign essential HTN   . Postoperative pain   . Hypertensive crisis   . 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 Hypoalbuminemia due to protein-calorie malnutrition ([place])   . Acute blood loss anemia   . Myelopathy ([place]) 12/10/2017  . HNP (herniated nucleus pulposus) with myelopathy, cervical   . Essential hypertension   . Uncomplicated asthma   . Insulin resistance   . Hashimoto's disease   . Cervical spondylolysis   . Anxiety   . Hyponatremia 12/01/2017  . Hypothyroidism 12/01/2017   Ruben Im, PT 05/28/18 9:03 PM Phone: [phone] Fax: [phone]  Alvera Singh 05/28/2018, 9:02 PM  [facility] [address] [place], [place], [address] Phone: [phone]   Fax:   [phone]  Name: Regina [name] MRN: [id number] Date of Birth: 1941/03/02

## 2018-05-30 ENCOUNTER — Ambulatory Visit: Payer: Federal, State, Local not specified - PPO | Admitting: Physical Therapy

## 2018-05-30 ENCOUNTER — Encounter: Payer: Self-pay | Admitting: Physical Therapy

## 2018-05-30 DIAGNOSIS — M6281 Muscle weakness (generalized): Secondary | ICD-10-CM | POA: Diagnosis not present

## 2018-05-30 DIAGNOSIS — M5412 Radiculopathy, cervical region: Secondary | ICD-10-CM

## 2018-05-30 NOTE — Therapy (Addendum)
[facility] [address] [place], [place], [address] Phone: [phone]   Fax:  [phone]  Physical Therapy Treatment  Patient Details  Name: Regina [name] MRN: [id number] Date of Birth: 11-25-1940 Referring Provider (PT): Dr. Maurice Small   Encounter Date: 05/30/2018  PT End of Session - 05/30/18 1258    Visit Number  17    Date for PT Re-Evaluation  07/02/18    Authorization Type  BCBS 50 visit limit    PT Start Time  1231    PT Stop Time  1315    PT Time Calculation (min)  44 min    Activity Tolerance  Patient tolerated treatment well       Past Medical History:  Diagnosis Date  . Anxiety   . Arthritis    neck  . Asthma   . Cervical spondylolysis   . Cervical spondylolysis   . Depression   . Hashimoto's disease   . Insulin resistance   . PONV (postoperative nausea and vomiting)    hasn't had surgery since 1993    Past Surgical History:  Procedure Laterality Date  . ANTERIOR CERVICAL DECOMP/DISCECTOMY FUSION N/A 12/06/2017   Procedure: ANTERIOR CERVICAL DECOMPRESSION/DISCECTOMY FUSION, CERVICAL 3- CERVICAL 4, CERVICAL 4- CERVICAL 5, CERVICAL 5- CERVICAL 6;  Surgeon: [name], MD;  Location: [place];  Service: Neurosurgery;  Laterality: N/A;  ANTERIOR CERVICAL DECOMPRESSION/DISCECTOMY FUSION, CERVICAL THREE- CERVICAL FOUR, CERVICAL FOUR- CERVICAL FIVE, CERVICAL FIVE- CERVICAL SIX  . APPENDECTOMY    . BREAST SURGERY     left biopsy  . CESAREAN SECTION     x2  . CHOLECYSTECTOMY    . DENTAL SURGERY     teeth removal  . OVARY SURGERY     ovarian wedge  . TONSILLECTOMY      There were no vitals filed for this visit.  Subjective Assessment - 05/30/18 1237    Subjective  Pt states that things are going well. She is working again this weekend.     Pertinent History  cervical myelopathy  ACDF C3-6 12/06/17  "[name]"    Currently in Pain?  No/denies         [facility] PT Assessment -  05/30/18 0001      Assessment   Medical Diagnosis  ACDF multi level fusion for myelopathy    Referring Provider (PT)  Dr. Maurice Small    Onset Date/Surgical Date  --   neurosurgeon 04/18/18;  just saw MD yesterday   Hand Dominance  Right    Next MD Visit  not sure     Prior Therapy  Home health      Precautions   Precautions  Cervical;Fall    Precaution Comments  no lift > 25#      Restrictions   Weight Bearing Restrictions  No      [place] residence    Living Arrangements  Spouse/significant other    Available Help at Discharge  Family    Type of [place] to enter    Entrance Stairs-Number of Steps  1    [place]  One level    [place] - single point      Prior Function   Level of [place] with basic ADLs    Vocation  Part time employment    Leisure  walk, read; go out to eat  Observation/Other Assessments   Focus on Therapeutic Outcomes (FOTO)   37% limitation       Sensation   Light Touch  Impaired by gross assessment    Additional Comments  bil hands and feet      Posture/Postural Control   Posture/Postural Control  Postural limitations    Postural Limitations  Rounded Shoulders;Forward head      AROM   Overall AROM Comments  UE and LEs WFLs throughout;  cervical ROM not assessed secondary to recent multi level fusion      Strength   Right Hand Grip (lbs)  25    Left Hand Grip (lbs)  30    Right Hip Flexion  5/5    Right Hip Extension  4-/5    Left Hip Flexion  5/5    Left Hip Extension  4-/5    Right Knee Flexion  5/5    Right Knee Extension  5/5    Left Knee Flexion  5/5    Left Knee Extension  5/5    Right Ankle Dorsiflexion  5/5    Right Ankle Plantar Flexion  5/5    Right Ankle Eversion  4/5    Left Ankle Dorsiflexion  4-/5    Left Ankle Plantar Flexion  5/5    Left Ankle Eversion  4/5      Bed Mobility   Supine to Sit  Minimal Assistance -  Patient > 75%      Ambulation/Gait   Assistive device  Straight cane    Gait Comments  decreased gait speed, decreased heel strike      6 Minute Walk- Baseline   6 Minute Walk- Baseline  yes   to be assessed next visit     Standardized Balance Assessment   Five times sit to stand comments   30 sec no hands   14.9 sec at last assessment    10 Meter Walk  --      Berg Balance Test   Sit to Stand  Able to stand without using hands and stabilize independently    Standing Unsupported  Able to stand safely 2 minutes    Sitting with Back Unsupported but Feet Supported on Floor or Stool  Able to sit safely and securely 2 minutes    Stand to Sit  Sits safely with minimal use of hands    Transfers  Able to transfer safely, minor use of hands    Standing Unsupported with Eyes Closed  Able to stand 10 seconds safely    Standing Ubsupported with Feet Together  Able to place feet together independently and stand 1 minute safely    From Standing, Reach Forward with Outstretched Arm  Can reach forward >12 cm safely (5")    From Standing Position, Pick up Object from Floor  Able to pick up shoe, needs supervision    From Standing Position, Turn to Look Behind Over each Shoulder  Looks behind one side only/other side shows less weight shift    Turn 360 Degrees  Able to turn 360 degrees safely but slowly    Standing Unsupported, Alternately Place Feet on Step/Stool  Able to stand independently and complete 8 steps >20 seconds    Standing Unsupported, One Foot in Front  Able to plae foot ahead of the other independently and hold 30 seconds    Standing on One Leg  Tries to lift leg/unable to hold 3 seconds but remains standing independently   Lt able to hold 10 sec,  Rt 2 sec    Total Score  46      Timed Up and Go Test   Normal TUG (seconds)  17   with cane                          PT Education - 05/30/18 1452    Education Details  reviewed progress/POC moving forward      Person(s) Educated  Patient    Methods  Explanation    Comprehension  Verbalized understanding       PT Short Term Goals - 05/30/18 1259      PT SHORT TERM GOAL #1   Title  The patient will have improved gait speed with appropriate assistive device with TUG to 17 sec    Status  Partially Met      PT SHORT TERM GOAL #2   Title  BERG balance score improved to 40/56 indicating improved dynamic balance and decreased risk for falls    Baseline  46/56    Status  Achieved      PT SHORT TERM GOAL #3   Title  The patient will have improved core and proximal UE strength needed to transfer from supine to sitting independently    Status  Achieved      PT SHORT TERM GOAL #4   Title  Improved LE strength as evident with improved 5x sit to stand test in 20 sec or less    Baseline  26 sec with UE on thighs    Status  Partially Met        PT Long Term Goals - 05/30/18 1301      PT LONG TERM GOAL #1   Title  The patient will be independent with HEP needed for further improvements in mobility, balance and strength    Time  8    Period  Weeks    Status  On-going      PT LONG TERM GOAL #2   Title  The patient will have improved BERG balance test to 48/56 indicating improved gait safety and balance    Baseline  46/56     Time  8    Period  Weeks    Status  Partially Met      PT LONG TERM GOAL #3   Title  The patient will have improved grip strength to 30# and pincher squeeze to 8# needed for work tasks    Baseline  25# on Rt, 30# on Lt    Time  8    Period  Weeks    Status  Partially Met      PT LONG TERM GOAL #4   Title  The patient will be able to ambulate with appropriate assistive device 500 feet on paved level surfaces without loss of balance     Status  Achieved      PT LONG TERM GOAL #5   Title  FOTO functional outcome score improved from 46% limitation to 39% indicating improved function with less pain     Status  Achieved      PT LONG TERM GOAL #6   Title  The patient  will be able to carry a 5# grocery bag 75 feet needed to unload her car after shopping    Status  Achieved      PT LONG TERM GOAL #7   Title  The patient will have improved UE /LE strength to be able to get on and  off the floor    Status  Achieved      PT LONG TERM GOAL #8   Period  Weeks    Status  On-going            Plan - 05/30/18 1453    Clinical Impression Statement  Pt was reassessed this visit having demonstrated good progress towards her goals since her POC extension 4 weeks ago. She met all of her short term goals and has met 50% of her long term goals. She had a 5 point increase in her BERG balance score and was able to maintain single leg stance for up to 10 sec on the LLE without LOB. Her strength has improved to 5/5 MMT for most muscle groups in the LE. Pt has been mostly adherent to her HEP, however she would benefit from updates to this moving forward. She still has limitations in Rt grip strength, atleast 5 lb less than her non-dominant side. In addition, she has performed well on her functional testing in the past, however this is not consistent with today's re-evaluation. She would continue to benefit from skilled PT to address her remaining limitations in proprioception, muscle power and strength to make further progress towards her remaining long term goals. She was agreeable with decrease to 1x/week for the remainder of her POC to allow for progress towards goals and safe transition to a gym program and other functional activity without risk of injury.     Rehab Potential  Good    Clinical Impairments Affecting Rehab Potential  Multi-level cervical fusion 12/06/17    PT Frequency  2x / week    PT Duration  8 weeks    PT Treatment/Interventions  ADLs/Self Care Home Management;Electrical Stimulation;Cryotherapy;Moist Heat;Therapeutic exercise;Therapeutic activities;Neuromuscular re-education;Patient/family education;Manual techniques;Balance training    PT Next Visit Plan   pt felt uneasy about early discharge but was agreeable with 1x/week; needs condensed/updated HEP; balance while holding objects/etc; progress hand strength; single leg balance     PT Home Exercise Plan  [email or web address]    Consulted and Agree with Plan of Care  Patient       Patient will benefit from skilled therapeutic intervention in order to improve the following deficits and impairments:  Abnormal gait, Pain, Decreased activity tolerance, Decreased range of motion, Decreased strength, Impaired perceived functional ability, Difficulty walking, Decreased balance  Visit Diagnosis: Muscle weakness (generalized)  Radiculopathy, cervical region     Problem List Patient Active Problem List   Diagnosis Date Noted  . Leukocytosis   . Hypokalemia   . Labile blood pressure   . Benign essential HTN   . Postoperative pain   . Hypertensive crisis   . 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 Hypoalbuminemia due to protein-calorie malnutrition ([place])   . Acute blood loss anemia   . Myelopathy ([place]) 12/10/2017  . HNP (herniated nucleus pulposus) with myelopathy, cervical   . Essential hypertension   . Uncomplicated asthma   . Insulin resistance   . Hashimoto's disease   . Cervical spondylolysis   . Anxiety   . Hyponatremia 12/01/2017  . Hypothyroidism 12/01/2017    3:53 PM,05/30/18 Sherol Dade PT, DPT [facility] [address], [place], [place], [address] Phone: [phone]   Fax:  [phone]  Name: Regina [name] MRN: [id number] Date of Birth: 09-18-1940  *Addendum to include KX modifiers  4:12 PM,05/30/18 Sherol Dade PT,  DPT [place] [facility]

## 2018-06-03 ENCOUNTER — Telehealth: Payer: Self-pay

## 2018-06-06 ENCOUNTER — Encounter: Payer: Self-pay | Admitting: Physical Therapy

## 2018-06-06 ENCOUNTER — Ambulatory Visit: Payer: Federal, State, Local not specified - PPO | Attending: Family Medicine | Admitting: Physical Therapy

## 2018-06-06 DIAGNOSIS — M6281 Muscle weakness (generalized): Secondary | ICD-10-CM | POA: Diagnosis present

## 2018-06-06 DIAGNOSIS — M5412 Radiculopathy, cervical region: Secondary | ICD-10-CM

## 2018-06-06 DIAGNOSIS — R262 Difficulty in walking, not elsewhere classified: Secondary | ICD-10-CM

## 2018-06-06 NOTE — Patient Instructions (Signed)
Access Code: [email or web address]  URL: [email or web address]  Date: 06/06/2018  Prepared by: Ruben Im   Exercises  Seated Long Arc Quad - 10 reps - 1 sets - 5 hold - 3x daily - 7x weekly  Heel rises with counter support - 10 reps - 2 sets - 2 hold - 3x daily - 7x weekly  Standing Hip Abduction with Counter Support - 10 reps - 1 sets - 3x daily - 7x weekly  Forearm Pronation with Dumbbell - 10 reps - 2 sets - 2x daily - 7x weekly  Seated Hip Flexion March with Ankle Weights - 10 reps - 2 sets - 1x daily - 7x weekly  Seated Wrist Supination Pronation with Can - 10 reps - 2 sets - 2x daily - 7x weekly  Wrist Extension with Dumbbell - 10 reps - 2 sets - 2x daily - 7x weekly  Single Leg Stance with Support - 3 reps - 1 sets - 20 hold - 1x daily - 7x weekly  Tandem Stance with Support - 3 reps - 1 sets - 20 hold - 1x daily - 7x weekly  Standing Balance with Diagonal Ball Lift - 10 reps - 1 sets - 1x daily - 7x weekly  Seated Gripping Towel - 10 reps - 1 sets - 1x daily - 7x weekly  Forearm PROM Supination and Pronation with Mid Grip on Hammer - 10 reps - 1 sets - 1x daily - 7x weekly  Sit to Stand - 10 reps - 1 sets - 1x daily - 7x weekly

## 2018-06-06 NOTE — Therapy (Signed)
[facility] [address] [place], [place], [address] Phone: [phone]   Fax:  [phone]  Physical Therapy Treatment  Patient Details  Name: Regina [name] MRN: [id number] Date of Birth: [birth year] Referring Provider (PT): Dr. Maurice Small   Encounter Date: 06/06/2018  PT End of Session - 06/06/18 1412    Visit Number  18    Date for PT Re-Evaluation  07/02/18    Authorization Type  BCBS 50 visit limit    PT Start Time  9295    PT Stop Time  1444    PT Time Calculation (min)  39 min    Activity Tolerance  Patient tolerated treatment well       Past Medical History:  Diagnosis Date  . Anxiety   . Arthritis    neck  . Asthma   . Cervical spondylolysis   . Cervical spondylolysis   . Depression   . Hashimoto's disease   . Insulin resistance   . PONV (postoperative nausea and vomiting)    hasn't had surgery since 1993    Past Surgical History:  Procedure Laterality Date  . ANTERIOR CERVICAL DECOMP/DISCECTOMY FUSION N/A 12/06/2017   Procedure: ANTERIOR CERVICAL DECOMPRESSION/DISCECTOMY FUSION, CERVICAL 3- CERVICAL 4, CERVICAL 4- CERVICAL 5, CERVICAL 5- CERVICAL 6;  Surgeon: [name], MD;  Location: [place];  Service: Neurosurgery;  Laterality: N/A;  ANTERIOR CERVICAL DECOMPRESSION/DISCECTOMY FUSION, CERVICAL THREE- CERVICAL FOUR, CERVICAL FOUR- CERVICAL FIVE, CERVICAL FIVE- CERVICAL SIX  . APPENDECTOMY    . BREAST SURGERY     left biopsy  . CESAREAN SECTION     x2  . CHOLECYSTECTOMY    . DENTAL SURGERY     teeth removal  . OVARY SURGERY     ovarian wedge  . TONSILLECTOMY      There were no vitals filed for this visit.  Subjective Assessment - 06/06/18 1405    Subjective  I tweaked my shoulder shutting the car door at [facility].  The contact person at the [place] program left a message.  They got me a bar stool for work but it's a little high.  Worked at the [employer] 11-6.  I carried a  light grocery bag uphill to the house.     Pertinent History  cervical myelopathy  ACDF C3-6 12/06/17  "Regina [name]"    Currently in Pain?  No/denies    Pain Score  0-No pain                       [facility] Adult PT Treatment/Exercise - 06/06/18 0001      Therapeutic Activites    ADL's  sit to stand holding 5# weight     Work Simulation  carrying 5# weight 100 feet and up and down stairs       Neuro Re-ed    Neuro Re-ed Details   SLS with min UE support; tandem standing with min UE support       Knee/Hip Exercises: Aerobic   Nustep  L2 10 min seat 5    while discussing her work function and overall progress     Knee/Hip Exercises: Standing   Hip Abduction  AROM;Right;Left;10 reps    Abduction Limitations  with light UE support      Knee/Hip Exercises: Seated   Sit to Sand  10 reps;without UE support      Hand Exercises for Cervical Radiculopathy   Gross Grasp  5# shoulder to shoulder 10x  Towel Squeeze  towel squeeze 15x    Other Hand Exercise for Cervical Radiculopathy  2# pronation/supination 15x right/left    Other Hand Exercise for Cervical Radiculopathy  2# wrist extension 15x right/left             PT Education - 06/06/18 1412    Education Details   Access Code: [email or web address] SLS, tandem stand, sit to stand; gripping ex    Person(s) Educated  Patient    Methods  Explanation;Demonstration;Handout    Comprehension  Returned demonstration;Verbalized understanding       PT Short Term Goals - 05/30/18 1259      PT SHORT TERM GOAL #1   Title  The patient will have improved gait speed with appropriate assistive device with TUG to 17 sec    Status  Partially Met      PT SHORT TERM GOAL #2   Title  BERG balance score improved to 40/56 indicating improved dynamic balance and decreased risk for falls    Baseline  46/56    Status  Achieved      PT SHORT TERM GOAL #3   Title  The patient will have improved core and proximal UE strength needed to transfer from  supine to sitting independently    Status  Achieved      PT SHORT TERM GOAL #4   Title  Improved LE strength as evident with improved 5x sit to stand test in 20 sec or less    Baseline  26 sec with UE on thighs    Status  Partially Met        PT Long Term Goals - 05/30/18 1301      PT LONG TERM GOAL #1   Title  The patient will be independent with HEP needed for further improvements in mobility, balance and strength    Time  8    Period  Weeks    Status  On-going      PT LONG TERM GOAL #2   Title  The patient will have improved BERG balance test to 48/56 indicating improved gait safety and balance    Baseline  46/56     Time  8    Period  Weeks    Status  Partially Met      PT LONG TERM GOAL #3   Title  The patient will have improved grip strength to 30# and pincher squeeze to 8# needed for work tasks    Baseline  25# on Rt, 30# on Lt    Time  8    Period  Weeks    Status  Partially Met      PT LONG TERM GOAL #4   Title  The patient will be able to ambulate with appropriate assistive device 500 feet on paved level surfaces without loss of balance     Status  Achieved      PT LONG TERM GOAL #5   Title  FOTO functional outcome score improved from 46% limitation to 39% indicating improved function with less pain     Status  Achieved      PT LONG TERM GOAL #6   Title  The patient will be able to carry a 5# grocery bag 75 feet needed to unload her car after shopping    Status  Achieved      PT LONG TERM GOAL #7   Title  The patient will have improved UE /LE strength to be able to get on and off the  floor    Status  Achieved      PT LONG TERM GOAL #8   Period  Weeks    Status  On-going            Plan - 06/06/18 1653    Clinical Impression Statement  The patient continues to progress with UE strength, dynamic balance and return to function.  Treatment focus on establishing a progession of HEP to address remaining deficits.  No loss of balance but therapist  providing close supervision for  safety with balance challenges.  She should meet remaining LTGs in next 2-3 visits and should be ready for discharge from PT.      Rehab Potential  Good    Clinical Impairments Affecting Rehab Potential  Multi-level cervical fusion 12/06/17    PT Frequency  2x / week    PT Duration  8 weeks    PT Treatment/Interventions  ADLs/Self Care Home Management;Electrical Stimulation;Cryotherapy;Moist Heat;Therapeutic exercise;Therapeutic activities;Neuromuscular re-education;Patient/family education;Manual techniques;Balance training    PT Next Visit Plan  add LE ways and step ups to  HEP; balance while holding objects/etc; progress hand strength; single leg balance     PT Home Exercise Plan  [email or web address]    Recommended Other Services  PREP program referral has been made       Patient will benefit from skilled therapeutic intervention in order to improve the following deficits and impairments:  Abnormal gait, Pain, Decreased activity tolerance, Decreased range of motion, Decreased strength, Impaired perceived functional ability, Difficulty walking, Decreased balance  Visit Diagnosis: Muscle weakness (generalized)  Radiculopathy, cervical region  Difficulty in walking, not elsewhere classified     Problem List Patient Active Problem List   Diagnosis Date Noted  . Leukocytosis   . Hypokalemia   . Labile blood pressure   . Benign essential HTN   . Postoperative pain   . Hypertensive crisis   . 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 Hypoalbuminemia due to protein-calorie malnutrition ([place])   . Acute blood loss anemia   . Myelopathy ([place]) 12/10/2017  . HNP (herniated nucleus pulposus) with myelopathy, cervical   . Essential hypertension   . Uncomplicated asthma   . Insulin resistance   . Hashimoto's disease   . Cervical spondylolysis   . Anxiety   . Hyponatremia 12/01/2017  . Hypothyroidism 12/01/2017   Regina [name], PT 06/06/18 4:58 PM Phone:  [phone] Fax: [phone] Regina [name] 06/06/2018, 4:57 PM  [facility] [address], [place], [place], [address] Phone: [phone]   Fax:  [phone]  Name: Regina [name] MRN: [id number] Date of Birth: 1940-09-07

## 2018-06-13 ENCOUNTER — Other Ambulatory Visit: Payer: Self-pay

## 2018-06-13 ENCOUNTER — Encounter: Payer: Self-pay | Admitting: Physical Therapy

## 2018-06-13 ENCOUNTER — Ambulatory Visit: Payer: Federal, State, Local not specified - PPO | Admitting: Physical Therapy

## 2018-06-13 DIAGNOSIS — M6281 Muscle weakness (generalized): Secondary | ICD-10-CM | POA: Diagnosis not present

## 2018-06-13 DIAGNOSIS — R262 Difficulty in walking, not elsewhere classified: Secondary | ICD-10-CM

## 2018-06-13 DIAGNOSIS — M5412 Radiculopathy, cervical region: Secondary | ICD-10-CM

## 2018-06-13 NOTE — Therapy (Signed)
[facility] [address] [place], [place], [address] Phone: [phone]   Fax:  [phone]  Physical Therapy Treatment  Patient Details  Name: Regina [name] MRN: [id number] Date of Birth: 1941-04-02 Referring Provider (PT): Dr. Maurice Small   Encounter Date: 06/13/2018  PT End of Session - 06/13/18 1237    Visit Number  19    Date for PT Re-Evaluation  07/02/18    Authorization Type  BCBS 50 visit limit    PT Start Time  1226    PT Stop Time  1310    PT Time Calculation (min)  44 min    Activity Tolerance  Patient tolerated treatment well       Past Medical History:  Diagnosis Date  . Anxiety   . Arthritis    neck  . Asthma   . Cervical spondylolysis   . Cervical spondylolysis   . Depression   . Hashimoto's disease   . Insulin resistance   . PONV (postoperative nausea and vomiting)    hasn't had surgery since 1993    Past Surgical History:  Procedure Laterality Date  . ANTERIOR CERVICAL DECOMP/DISCECTOMY FUSION N/A 12/06/2017   Procedure: ANTERIOR CERVICAL DECOMPRESSION/DISCECTOMY FUSION, CERVICAL 3- CERVICAL 4, CERVICAL 4- CERVICAL 5, CERVICAL 5- CERVICAL 6;  Surgeon: [name], MD;  Location: [place];  Service: Neurosurgery;  Laterality: N/A;  ANTERIOR CERVICAL DECOMPRESSION/DISCECTOMY FUSION, CERVICAL THREE- CERVICAL FOUR, CERVICAL FOUR- CERVICAL FIVE, CERVICAL FIVE- CERVICAL SIX  . APPENDECTOMY    . BREAST SURGERY     left biopsy  . CESAREAN SECTION     x2  . CHOLECYSTECTOMY    . DENTAL SURGERY     teeth removal  . OVARY SURGERY     ovarian wedge  . TONSILLECTOMY      There were no vitals filed for this visit.  Subjective Assessment - 06/13/18 1226    Subjective  Patient reports she doesn't have to work at the [employer] b/c of the coronavirus.     Pertinent History  cervical myelopathy  ACDF C3-6 12/06/17  "Regina [name]"    Currently in Pain?  No/denies    Pain Score  0-No pain                       [facility] Adult PT Treatment/Exercise - 06/13/18 0001      Therapeutic Activites    ADL's  sit to stand holding 5# weight     Work Simulation  carrying 5# weight 100 feet x2      Neuro Re-ed    Neuro Re-ed Details   high step, side step, turning and stopping; 180 degree turns       Neck Exercises: Metallurgist for Strengthening  Leg press seat 5 70# bil 15x; 40# single leg 15x right/left    right side more difficult      Neck Exercises: Standing   Other Standing Exercises  UE ranger on wall with 5# press 10x; on floor 5# 10x on left       Knee/Hip Exercises: Aerobic   Nustep  L2 10 min seat 5    while discussing her work function and overall progress     Knee/Hip Exercises: Standing   Heel Raises  Both;20 reps    Forward Step Up  Right;Left;Hand Hold: 0;Step Height: 6"    Forward Step Up Limitations  2 minutes    Other Standing Knee Exercises  10# hip hinge to knee level     Other Standing Knee Exercises  floor sliders 3 ways 10x right/left               PT Short Term Goals - 05/30/18 1259      PT SHORT TERM GOAL #1   Title  The patient will have improved gait speed with appropriate assistive device with TUG to 17 sec    Status  Partially Met      PT SHORT TERM GOAL #2   Title  BERG balance score improved to 40/56 indicating improved dynamic balance and decreased risk for falls    Baseline  46/56    Status  Achieved      PT SHORT TERM GOAL #3   Title  The patient will have improved core and proximal UE strength needed to transfer from supine to sitting independently    Status  Achieved      PT SHORT TERM GOAL #4   Title  Improved LE strength as evident with improved 5x sit to stand test in 20 sec or less    Baseline  26 sec with UE on thighs    Status  Partially Met        PT Long Term Goals - 05/30/18 1301      PT LONG TERM GOAL #1   Title  The patient will be independent with HEP needed  for further improvements in mobility, balance and strength    Time  8    Period  Weeks    Status  On-going      PT LONG TERM GOAL #2   Title  The patient will have improved BERG balance test to 48/56 indicating improved gait safety and balance    Baseline  46/56     Time  8    Period  Weeks    Status  Partially Met      PT LONG TERM GOAL #3   Title  The patient will have improved grip strength to 30# and pincher squeeze to 8# needed for work tasks    Baseline  25# on Rt, 30# on Lt    Time  8    Period  Weeks    Status  Partially Met      PT LONG TERM GOAL #4   Title  The patient will be able to ambulate with appropriate assistive device 500 feet on paved level surfaces without loss of balance     Status  Achieved      PT LONG TERM GOAL #5   Title  FOTO functional outcome score improved from 46% limitation to 39% indicating improved function with less pain     Status  Achieved      PT LONG TERM GOAL #6   Title  The patient will be able to carry a 5# grocery bag 75 feet needed to unload her car after shopping    Status  Achieved      PT LONG TERM GOAL #7   Title  The patient will have improved UE /LE strength to be able to get on and off the floor    Status  Achieved      PT LONG TERM GOAL #8   Period  Weeks    Status  On-going            Plan - 06/13/18 1413    Clinical Impression Statement  The patient reports some right hip discomfort today for no apparent reason  and has difficulty with right single leg presses today.  She has a loss of balance while carrying a 5# object while walking requiring therapist assist to recover.  Wider based gait and slower gait speed consistent with increased unsteadiness today.  Therapist modifying as needed for pain and providing assist for safety.      Rehab Potential  Good    Clinical Impairments Affecting Rehab Potential  Multi-level cervical fusion 12/06/17    PT Frequency  2x / week    PT Duration  8 weeks    PT  Treatment/Interventions  ADLs/Self Care Home Management;Electrical Stimulation;Cryotherapy;Moist Heat;Therapeutic exercise;Therapeutic activities;Neuromuscular re-education;Patient/family education;Manual techniques;Balance training    PT Next Visit Plan  20th visit prog note;  KX;  add LE ways and step ups to  HEP; balance while holding objects/etc; progress hand strength; single leg balance     PT Home Exercise Plan  [id number]       Patient will benefit from skilled therapeutic intervention in order to improve the following deficits and impairments:  Abnormal gait, Pain, Decreased activity tolerance, Decreased range of motion, Decreased strength, Impaired perceived functional ability, Difficulty walking, Decreased balance  Visit Diagnosis: Muscle weakness (generalized)  Radiculopathy, cervical region  Difficulty in walking, not elsewhere classified     Problem List Patient Active Problem List   Diagnosis Date Noted  . Leukocytosis   . Hypokalemia   . Labile blood pressure   . Benign essential HTN   . Postoperative pain   . Hypertensive crisis   . 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 Hypoalbuminemia due to protein-calorie malnutrition ([place])   . Acute blood loss anemia   . Myelopathy ([place]) 12/10/2017  . HNP (herniated nucleus pulposus) with myelopathy, cervical   . Essential hypertension   . Uncomplicated asthma   . Insulin resistance   . Hashimoto's disease   . Cervical spondylolysis   . Anxiety   . Hyponatremia 12/01/2017  . Hypothyroidism 12/01/2017   Ruben Im, PT 06/13/18 2:19 PM Phone: [phone] Fax: [phone]  Alvera Singh 06/13/2018, 2:19 PM  [facility] [address] [place], [place], [address] Phone: [phone]   Fax:  [phone]  Name: Regina [name] MRN: [id number] Date of Birth: July 13, 1940

## 2018-06-14 NOTE — Telephone Encounter (Signed)
Left a VM for ms. Michaelson to call back in regards to the 12-week PREP at the [facility]

## 2018-06-20 ENCOUNTER — Other Ambulatory Visit: Payer: Self-pay

## 2018-06-20 ENCOUNTER — Ambulatory Visit: Payer: Federal, State, Local not specified - PPO | Admitting: Physical Therapy

## 2018-06-20 ENCOUNTER — Encounter: Payer: Self-pay | Admitting: Physical Therapy

## 2018-06-20 DIAGNOSIS — M5412 Radiculopathy, cervical region: Secondary | ICD-10-CM

## 2018-06-20 DIAGNOSIS — M6281 Muscle weakness (generalized): Secondary | ICD-10-CM | POA: Diagnosis not present

## 2018-06-20 DIAGNOSIS — R262 Difficulty in walking, not elsewhere classified: Secondary | ICD-10-CM

## 2018-06-20 NOTE — Therapy (Addendum)
[facility] [address] [place], [place], [address] Phone: [phone]   Fax:  [phone]  Physical Therapy Treatment/Discharge Summary   Patient Details  Name: Regina [name] MRN: [id number] Date of Birth: 08/05/1940 Referring Provider (PT): Dr. Maurice Small  Progress Note Reporting Period 05/03/2018 to 06/19/2018  See note below for Objective Data and Assessment of Progress/Goals.      Encounter Date: 06/20/2018  PT End of Session - 06/20/18 1250    Visit Number  20    Date for PT Re-Evaluation  07/02/18    Authorization Type  BCBS 50 visit limit    PT Start Time  1233    PT Stop Time  1315    PT Time Calculation (min)  42 min    Activity Tolerance  Patient tolerated treatment well       Past Medical History:  Diagnosis Date  . Anxiety   . Arthritis    neck  . Asthma   . Cervical spondylolysis   . Cervical spondylolysis   . Depression   . Hashimoto's disease   . Insulin resistance   . PONV (postoperative nausea and vomiting)    hasn't had surgery since 1993    Past Surgical History:  Procedure Laterality Date  . ANTERIOR CERVICAL DECOMP/DISCECTOMY FUSION N/A 12/06/2017   Procedure: ANTERIOR CERVICAL DECOMPRESSION/DISCECTOMY FUSION, CERVICAL 3- CERVICAL 4, CERVICAL 4- CERVICAL 5, CERVICAL 5- CERVICAL 6;  Surgeon: [name], MD;  Location: [facility];  Service: Neurosurgery;  Laterality: N/A;  ANTERIOR CERVICAL DECOMPRESSION/DISCECTOMY FUSION, CERVICAL THREE- CERVICAL FOUR, CERVICAL FOUR- CERVICAL FIVE, CERVICAL FIVE- CERVICAL SIX  . APPENDECTOMY    . BREAST SURGERY     left biopsy  . CESAREAN SECTION     x2  . CHOLECYSTECTOMY    . DENTAL SURGERY     teeth removal  . OVARY SURGERY     ovarian wedge  . TONSILLECTOMY      There were no vitals filed for this visit.  Subjective Assessment - 06/20/18 1236    Subjective  I'm going to see Dr. Justin Mend on Monday b/c my arms and leg don't  feel as strong.  I wonder if my electrolytes are off.  I got some things to squeeze for my grip strength.      Pertinent History  cervical myelopathy  ACDF C3-6 12/06/17  "[name]"    Currently in Pain?  No/denies    Pain Score  0-No pain    Pain Type  Chronic pain         [facility] PT Assessment - 06/20/18 0001      6 Minute Walk- Baseline   6 Minute Walk- Baseline  --   with cane 570 feet     Berg Balance Test   Sit to Stand  Able to stand without using hands and stabilize independently    Standing Unsupported  Able to stand safely 2 minutes    Sitting with Back Unsupported but Feet Supported on Floor or Stool  Able to sit safely and securely 2 minutes    Stand to Sit  Sits safely with minimal use of hands    Transfers  Able to transfer safely, minor use of hands    Standing Unsupported with Eyes Closed  Able to stand 10 seconds safely    Standing Unsupported with Feet Together  Able to place feet together independently and stand 1 minute safely    From Standing, Reach Forward with Outstretched Arm  Can reach forward >12 cm safely (5")    From Standing Position, Pick up Object from [place] to pick up shoe safely and easily    From Standing Position, Turn to Look Behind Over each Shoulder  Looks behind one side only/other side shows less weight shift    Turn 360 Degrees  Able to turn 360 degrees safely one side only in 4 seconds or less    Standing Unsupported, Alternately Place Feet on Step/Stool  Able to complete 4 steps without aid or supervision    Standing Unsupported, One Foot in Front  Able to plae foot ahead of the other independently and hold 30 seconds    Standing on One Leg  Tries to lift leg/unable to hold 3 seconds but remains standing independently    Total Score  47      Timed Up and Go Test   Normal TUG (seconds)  15.5   no cane       5x sit to stand 18 sec           [facility] Adult PT Treatment/Exercise - 06/20/18 0001      Therapeutic Activites    ADL's   10# dead lift 10x    Work Economist  carrying 5# weight 100 feet x2      Lumbar Exercises: Aerobic   UBE (Upper Arm Bike)  standing 30 sec intervals, 30 sec rest between 3 min total       Knee/Hip Exercises: Machines for Strengthening   Cybex Leg Press  70# seat 5 20x;  single leg 35# 15x right/left                PT Short Term Goals - 06/20/18 1314      PT SHORT TERM GOAL #1   Title  The patient will have improved gait speed with appropriate assistive device with TUG to 17 sec  (Pended)     Status  Achieved  (Pended)       PT SHORT TERM GOAL #2   Title  BERG balance score improved to 40/56 indicating improved dynamic balance and decreased risk for falls  (Pended)     Status  Achieved  (Pended)       PT SHORT TERM GOAL #3   Title  The patient will have improved core and proximal UE strength needed to transfer from supine to sitting independently  (Pended)     Status  Achieved  (Pended)       PT SHORT TERM GOAL #4   Title  Improved LE strength as evident with improved 5x sit to stand test in 20 sec or less  (Pended)     Status  Achieved  (Pended)         PT Long Term Goals - 05/30/18 1301      PT LONG TERM GOAL #1   Title  The patient will be independent with HEP needed for further improvements in mobility, balance and strength    Time  8    Period  Weeks    Status  On-going      PT LONG TERM GOAL #2   Title  The patient will have improved BERG balance test to 48/56 indicating improved gait safety and balance    Baseline  46/56     Time  8    Period  Weeks    Status  Partially Met      PT LONG TERM GOAL #3   Title  The patient  will have improved grip strength to 30# and pincher squeeze to 8# needed for work tasks    Baseline  25# on Rt, 30# on Lt    Time  8    Period  Weeks    Status  Partially Met      PT LONG TERM GOAL #4   Title  The patient will be able to ambulate with appropriate assistive device 500 feet on paved level surfaces without loss of  balance     Status  Achieved      PT LONG TERM GOAL #5   Title  FOTO functional outcome score improved from 46% limitation to 39% indicating improved function with less pain     Status  Achieved      PT LONG TERM GOAL #6   Title  The patient will be able to carry a 5# grocery bag 75 feet needed to unload her car after shopping    Status  Achieved      PT LONG TERM GOAL #7   Title  The patient will have improved UE /LE strength to be able to get on and off the floor    Status  Achieved      PT LONG TERM GOAL #8   Period  Weeks    Status  On-going            Plan - 06/20/18 1253    Clinical Impression Statement  Despite patient's report of feeling "weaker" today in her arms and legs,  she has improvements in BERG balance test, 6 min walk test and 5x sit to stand test.  She is continues to be at risk for falls and therefore it is recommended she continue using her cane on a fulltime basis.  She is able to carry a 5# object up and down the stairs and short to medium distances.  Discussed regular compliance with her HEP for best long term outcomes.  Therapist providing CGA and min assist with moderate balance challenges with the BERG.  She reports some right elbow pain with the upper body ergometer but no other complaints of pain.  All STGs now met and should meet remaining LTGs in 1-2 visits.      Rehab Potential  Good    Clinical Impairments Affecting Rehab Potential  Multi-level cervical fusion 12/06/17    PT Frequency  2x / week    PT Duration  8 weeks    PT Treatment/Interventions  ADLs/Self Care Home Management;Electrical Stimulation;Cryotherapy;Moist Heat;Therapeutic exercise;Therapeutic activities;Neuromuscular re-education;Patient/family education;Manual techniques;Balance training    PT Next Visit Plan  20th visit prog note;  KX;  add LE ways and step ups to  HEP; balance while holding objects/etc; progress hand strength; single leg balance     PT Home Exercise Plan  [id number]        Patient will benefit from skilled therapeutic intervention in order to improve the following deficits and impairments:  Abnormal gait, Pain, Decreased activity tolerance, Decreased range of motion, Decreased strength, Impaired perceived functional ability, Difficulty walking, Decreased balance  Visit Diagnosis: Muscle weakness (generalized)  Radiculopathy, cervical region  Difficulty in walking, not elsewhere classified     Problem List Patient Active Problem List   Diagnosis Date Noted  . Leukocytosis   . Hypokalemia   . Labile blood pressure   . Benign essential HTN   . Postoperative pain   . Hypertensive crisis   . 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 Hypoalbuminemia due to protein-calorie  malnutrition ([place])   . Acute blood loss anemia   . Myelopathy ([place]) 12/10/2017  . HNP (herniated nucleus pulposus) with myelopathy, cervical   . Essential hypertension   . Uncomplicated asthma   . Insulin resistance   . Hashimoto's disease   . Cervical spondylolysis   . Anxiety   . Hyponatremia 12/01/2017  . Hypothyroidism 12/01/2017     PHYSICAL THERAPY DISCHARGE SUMMARY  Visits from Start of Care: 20  Current functional level related to goals / functional outcomes: The patient was nearing the end of her PT plan of care when the covid pandemic closed the facility.  Will discharge from PT at this time but will be happy to see her at a later time with a new order if needed.     Remaining deficits: As above   Education / Equipment: Comprehensive HEP Plan:                                                    Patient goals were partially met. Patient is being discharged due to not returning since the last visit.  ?????     Ruben Im, PT 06/20/18 5:27 PM Phone: [phone] Fax: [phone]  Alvera Singh 06/20/2018, 5:26 PM  [facility] [address], [place], [place], [address] Phone:  [phone]   Fax:  [phone]  Name: Regina [name] MRN: [id number] Date of Birth: June 17, 1940

## 2018-06-27 ENCOUNTER — Encounter: Payer: Federal, State, Local not specified - PPO | Admitting: Physical Therapy

## 2018-07-02 ENCOUNTER — Encounter: Payer: Federal, State, Local not specified - PPO | Admitting: Physical Therapy

## 2018-07-09 ENCOUNTER — Ambulatory Visit: Payer: Federal, State, Local not specified - PPO | Admitting: Physical Therapy

## 2018-09-05 ENCOUNTER — Other Ambulatory Visit: Payer: Self-pay | Admitting: Family Medicine

## 2018-09-05 ENCOUNTER — Ambulatory Visit
Admission: RE | Admit: 2018-09-05 | Discharge: 2018-09-05 | Disposition: A | Discharge status: Home / Self Care | Payer: Federal, State, Local not specified - PPO | Source: Ambulatory Visit | Attending: Family Medicine | Admitting: Family Medicine

## 2018-09-05 ENCOUNTER — Other Ambulatory Visit: Payer: Self-pay

## 2018-09-05 DIAGNOSIS — R05 Cough: Secondary | ICD-10-CM

## 2018-09-05 DIAGNOSIS — R059 Cough, unspecified: Secondary | ICD-10-CM

## 2018-09-05 DIAGNOSIS — R0602 Shortness of breath: Secondary | ICD-10-CM

## 2018-10-09 ENCOUNTER — Ambulatory Visit
Admission: RE | Admit: 2018-10-09 | Discharge: 2018-10-09 | Disposition: A | Discharge status: Home / Self Care | Payer: Federal, State, Local not specified - PPO | Source: Ambulatory Visit | Attending: Family Medicine | Admitting: Family Medicine

## 2018-10-09 ENCOUNTER — Other Ambulatory Visit: Payer: Self-pay | Admitting: Family Medicine

## 2018-10-09 DIAGNOSIS — R059 Cough, unspecified: Secondary | ICD-10-CM

## 2018-10-09 DIAGNOSIS — R05 Cough: Secondary | ICD-10-CM

## 2018-11-15 ENCOUNTER — Other Ambulatory Visit: Payer: Self-pay | Admitting: Nephrology

## 2018-11-15 DIAGNOSIS — N183 Chronic kidney disease, stage 3 unspecified: Secondary | ICD-10-CM

## 2018-11-22 ENCOUNTER — Ambulatory Visit
Admission: RE | Admit: 2018-11-22 | Discharge: 2018-11-22 | Disposition: A | Discharge status: Home / Self Care | Payer: Federal, State, Local not specified - PPO | Source: Ambulatory Visit | Attending: Nephrology | Admitting: Nephrology

## 2018-11-22 DIAGNOSIS — N183 Chronic kidney disease, stage 3 unspecified: Secondary | ICD-10-CM

## 2018-11-26 ENCOUNTER — Encounter: Payer: Self-pay | Admitting: Pulmonary Disease

## 2018-11-26 ENCOUNTER — Ambulatory Visit: Payer: Federal, State, Local not specified - PPO | Admitting: Pulmonary Disease

## 2018-11-26 ENCOUNTER — Other Ambulatory Visit: Payer: Self-pay

## 2018-11-26 VITALS — BP 118/78 | HR 79 | Temp 98.5°F | Ht 62.5 in | Wt 138.0 lb

## 2018-11-26 DIAGNOSIS — J3089 Other allergic rhinitis: Secondary | ICD-10-CM | POA: Diagnosis not present

## 2018-11-26 DIAGNOSIS — J4520 Mild intermittent asthma, uncomplicated: Secondary | ICD-10-CM

## 2018-11-26 DIAGNOSIS — Z87891 Personal history of nicotine dependence: Secondary | ICD-10-CM | POA: Diagnosis not present

## 2018-11-26 DIAGNOSIS — R05 Cough: Secondary | ICD-10-CM

## 2018-11-26 DIAGNOSIS — R059 Cough, unspecified: Secondary | ICD-10-CM

## 2018-11-26 MED ORDER — BUDESONIDE-FORMOTEROL FUMARATE 160-4.5 MCG/ACT IN AERO: 2.0000 | INHALATION_SPRAY | Freq: Two times a day (BID) | RESPIRATORY_TRACT | 0 refills | Status: DC

## 2018-11-26 NOTE — Patient Instructions (Addendum)
Thank you for visiting Dr. Valeta Harms at [facility]. Today we recommend the following:  Orders Placed This Encounter  Procedures  . Pulmonary Function Test   Symbicort 160, 2 puffs twice daily with spacer Continue your albuterol for shortness of breath  Return in about 4 weeks (around 12/24/2018) for with APP or Dr. Valeta Harms.    Please do your part to reduce the spread of COVID-19.

## 2018-11-26 NOTE — Progress Notes (Signed)
Synopsis: Referred in August 2020 for cough by Maurice Small, MD  Subjective:   PATIENT ID: Regina [name] GENDER: female DOB: [birth year], MRN: [id number]  Chief Complaint  Patient presents with  . Consult    Consult for Cough. She reports she moved here 10 years and that is when the cough started. She reports having Asthma. She uses albuterol that she feels manages her occassional SOB. She reports her cough is dry and she has incrased sinus drainage.     78 year old female with a past medical history of hypertension, hypothyroidism, Hashimoto's, type 2 diabetes.  Presents for evaluation of cough. She has been treated on and off for several years for chronic cough. Treated for allergies and asthma. Treated for pneumonia about 1 month ago. She had asthma symptoms as a children. She moved down here 10 years ago, moved here from [place] and since then has had allergy trouble. Never had allergy testing before. She takes singulair and uses albuterol inhaler. The albuterol makes her symptoms better.  She has not been on a maintenance inhaler in the past.  She does remember a story that she was told by her mother as an infant she was told to be brought home from the hospital because there was nothing else they can do for her and that she would likely pass away.  Presumptively this was related to severe asthma exacerbation and respiratory failure/pneumonia.  Her parents nursed her back to health and she survived.  She had multiple exacerbations as a child requiring admission to the hospital.  She has never been intubated or placed on mechanical life support.    Past Medical History:  Diagnosis Date  . Anxiety   . Arthritis    neck  . Asthma   . Cervical spondylolysis   . Cervical spondylolysis   . Depression   . Hashimoto's disease   . Insulin resistance   . PONV (postoperative nausea and vomiting)    hasn't had surgery since 1993     Family History  Problem Relation Age of Onset   . COPD Mother   . Asthma Father   . Heart disease Father   . Asthma Paternal Grandfather      Past Surgical History:  Procedure Laterality Date  . ANTERIOR CERVICAL DECOMP/DISCECTOMY FUSION N/A 12/06/2017   Procedure: ANTERIOR CERVICAL DECOMPRESSION/DISCECTOMY FUSION, CERVICAL 3- CERVICAL 4, CERVICAL 4- CERVICAL 5, CERVICAL 5- CERVICAL 6;  Surgeon: [name], MD;  Location: [facility];  Service: Neurosurgery;  Laterality: N/A;  ANTERIOR CERVICAL DECOMPRESSION/DISCECTOMY FUSION, CERVICAL THREE- CERVICAL FOUR, CERVICAL FOUR- CERVICAL FIVE, CERVICAL FIVE- CERVICAL SIX  . APPENDECTOMY    . BREAST SURGERY     left biopsy  . CESAREAN SECTION     x2  . CHOLECYSTECTOMY    . DENTAL SURGERY     teeth removal  . OVARY SURGERY     ovarian wedge  . TONSILLECTOMY      Social History   Socioeconomic History  . Marital status: Married    Spouse name: Not on file  . Number of children: Not on file  . Years of education: Not on file  . Highest education level: Not on file  Occupational History  . Not on file  Social Needs  . Financial resource strain: Not on file  . Food insecurity    Worry: Not on file    Inability: Not on file  . Transportation needs    Medical: Not on file    Non-medical: Not on  file  Tobacco Use  . Smoking status: Former Smoker    Quit date: 1982    Years since quitting: 38.6  . Smokeless tobacco: Never Used  . Tobacco comment: Smoked 16-40ish, 1-2 ppd  Substance and Sexual Activity  . Alcohol use: Yes    Comment: very rare  . Drug use: Never  . Sexual activity: Not on file  Lifestyle  . Physical activity    Days per week: Not on file    Minutes per session: Not on file  . Stress: Not on file  Relationships  . Social Herbalist on phone: Not on file    Gets together: Not on file    Attends religious service: Not on file    Active member of club or organization: Not on file    Attends meetings of clubs or organizations: Not on file     Relationship status: Not on file  . Intimate partner violence    Fear of current or ex partner: Not on file    Emotionally abused: Not on file    Physically abused: Not on file    Forced sexual activity: Not on file  Other Topics Concern  . Not on file  Social History Narrative  . Not on file     Allergies  Allergen Reactions  . Ceclor [Cefaclor] Rash  . Naproxen Hives and Swelling    Mouth swelling  . Sulfa Antibiotics Nausea And Vomiting and Other (See Comments)    Syncope/headaches   . Amlodipine Cough  . Augmentin [Amoxicillin-Pot Clavulanate] Nausea And Vomiting    Has patient had a PCN reaction causing immediate rash, facial/tongue/throat swelling, SOB or lightheadedness with hypotension: No Has patient had a PCN reaction causing severe rash involving mucus membranes or skin necrosis: No Has patient had a PCN reaction that required hospitalization: No Has patient had a PCN reaction occurring within the last 10 years: No If all of the above answers are "NO", then may proceed with Cephalosporin use.   . Other Nausea And Vomiting    "pain medications" or opoids/Anesthesia  . Valsartan Cough     Outpatient Medications Prior to Visit  Medication Sig Dispense Refill  . acetaminophen (TYLENOL) 325 MG tablet Take 1-2 tablets (325-650 mg total) by mouth every 4 (four) hours as needed for mild pain.    Marland Kitchen albuterol (PROVENTIL HFA;VENTOLIN HFA) 108 (90 Base) MCG/ACT inhaler Inhale 1-2 puffs into the lungs every 6 (six) hours as needed for wheezing or shortness of breath (asthma/coughing).    . ALPRAZolam (XANAX) 0.25 MG tablet Take 1 tablet (0.25 mg total) by mouth 3 (three) times daily as needed for anxiety. 30 tablet 0  . escitalopram (LEXAPRO) 10 MG tablet Take 1 tablet (10 mg total) by mouth at bedtime. 30 tablet 0  . ferrous sulfate 325 (65 FE) MG EC tablet Take 325 mg by mouth daily.    Marland Kitchen levothyroxine (SYNTHROID) 75 MCG tablet Take 1 tablet (75 mcg total) by mouth daily before  breakfast. 30 tablet 0  . metaxalone (SKELAXIN) 800 MG tablet Take 1 tablet (800 mg total) by mouth 3 (three) times daily as needed for muscle spasms. 60 tablet 0  . metFORMIN (GLUCOPHAGE) 850 MG tablet Take 850 mg by mouth 2 (two) times daily.     . montelukast (SINGULAIR) 10 MG tablet Take 1 tablet (10 mg total) by mouth at bedtime. 30 tablet 3  . omeprazole (PRILOSEC) 40 MG capsule TAKE 1 CAPSULE BY MOUTH ONCE  DAILY 30 MINUTES BEFORE MORNING MEAL    . senna-docusate (SENOKOT-S) 8.6-50 MG tablet Take 1 tablet by mouth 2 (two) times daily.    Marland Kitchen triamterene-hydrochlorothiazide (MAXZIDE-25) 37.5-25 MG tablet     . VIRTUSSIN A/C 100-10 MG/5ML syrup TAKE 5 ML BY MOUTH EVERY 6 HOURS AS NEEDED FOR COUGH FOR 5 DAYS    . vitamin B-12 (CYANOCOBALAMIN) 100 MCG tablet Take 100 mcg by mouth daily.    . Vitamin D, Ergocalciferol, (DRISDOL) 1.25 MG (50000 UT) CAPS capsule TAKE 1 CAPSULE BY MOUTH ONCE A WEEK FOR 8 WEEKS WHEN FINISHED CAN TAKE 1000 UNITS OVER THE COUNTER DAILY    . hydrALAZINE (APRESOLINE) 50 MG tablet Take 1 tablet (50 mg total) by mouth every 8 (eight) hours. 90 tablet 0  . oxyCODONE (OXY IR/ROXICODONE) 5 MG immediate release tablet Take 1 tablet (5 mg total) by mouth every 3 (three) hours as needed for moderate pain ((score 4 to 6)). (Patient not taking: Reported on 03/14/2018) 30 tablet 0   No facility-administered medications prior to visit.     Review of Systems  Constitutional: Negative for chills, fever, malaise/fatigue and weight loss.  HENT: Negative for hearing loss, sore throat and tinnitus.   Eyes: Negative for blurred vision and double vision.  Respiratory: Positive for cough and shortness of breath. Negative for hemoptysis, sputum production, wheezing and stridor.   Cardiovascular: Negative for chest pain, palpitations, orthopnea, leg swelling and PND.  Gastrointestinal: Negative for abdominal pain, constipation, diarrhea, heartburn, nausea and vomiting.  Genitourinary:  Negative for dysuria, hematuria and urgency.  Musculoskeletal: Negative for joint pain and myalgias.  Skin: Negative for itching and rash.  Neurological: Negative for dizziness, tingling, weakness and headaches.  Endo/Heme/Allergies: Negative for environmental allergies. Does not bruise/bleed easily.  Psychiatric/Behavioral: Negative for depression. The patient is not nervous/anxious and does not have insomnia.   All other systems reviewed and are negative.    Objective:  Physical Exam Vitals signs reviewed.  Constitutional:      General: She is not in acute distress.    Appearance: She is well-developed.  HENT:     Head: Normocephalic and atraumatic.  Eyes:     General: No scleral icterus.    Conjunctiva/sclera: Conjunctivae normal.     Pupils: Pupils are equal, round, and reactive to light.  Neck:     Musculoskeletal: Neck supple.     Vascular: No JVD.     Trachea: No tracheal deviation.  Cardiovascular:     Rate and Rhythm: Normal rate and regular rhythm.     Heart sounds: Normal heart sounds. No murmur.  Pulmonary:     Effort: Pulmonary effort is normal. No tachypnea, accessory muscle usage or respiratory distress.     Breath sounds: Normal breath sounds. No stridor. No wheezing, rhonchi or rales.  Abdominal:     General: Bowel sounds are normal. There is no distension.     Palpations: Abdomen is soft.     Tenderness: There is no abdominal tenderness.  Musculoskeletal:        General: No tenderness.     Comments: Kyphosis of thoracic spine  Lymphadenopathy:     Cervical: No cervical adenopathy.  Skin:    General: Skin is warm and dry.     Capillary Refill: Capillary refill takes less than 2 seconds.     Findings: No rash.  Neurological:     Mental Status: She is alert and oriented to person, place, and time.  Psychiatric:  Behavior: Behavior normal.      Vitals:   11/26/18 1457  BP: 118/78  Pulse: 79  Temp: 98.5 F (36.9 C)  TempSrc: Temporal   SpO2: 100%  Weight: 138 lb (62.6 kg)  Height: 5' 2.5" (1.588 m)   100% on RA BMI Readings from Last 3 Encounters:  11/26/18 24.84 kg/m  12/19/17 29.44 kg/m  12/01/17 29.35 kg/m   Wt Readings from Last 3 Encounters:  11/26/18 138 lb (62.6 kg)  12/19/17 160 lb 15 oz (73 kg)  12/01/17 160 lb 7.9 oz (72.8 kg)     CBC    Component Value Date/Time   WBC 10.6 (H) 12/14/2017 0700   RBC 3.79 (L) 12/14/2017 0700   HGB 10.7 (L) 12/14/2017 0700   HGB 14.9 02/17/2013 1321   HCT 32.6 (L) 12/14/2017 0700   HCT 43.5 02/17/2013 1321   PLT 394 12/14/2017 0700   PLT 275 02/17/2013 1321   MCV 86.0 12/14/2017 0700   MCV 91 02/17/2013 1321   MCH 28.2 12/14/2017 0700   MCHC 32.8 12/14/2017 0700   RDW 15.5 12/14/2017 0700   RDW 14.2 02/17/2013 1321   LYMPHSABS 1.4 12/14/2017 0700   LYMPHSABS 2.4 02/17/2013 1321   MONOABS 1.1 (H) 12/14/2017 0700   EOSABS 0.4 12/14/2017 0700   EOSABS 0.3 02/17/2013 1321   BASOSABS 0.1 12/14/2017 0700   BASOSABS 0.1 02/17/2013 1321     Chest Imaging: Chest x-ray July 2020: Clear no active cardiopulmonary disease. The patient's images have been independently reviewed by me.    Pulmonary Functions Testing Results: No flowsheet data found.  FeNO: None   Pathology: None  Echocardiogram:  September 2019: Ejection fraction 60 to 65%, peak pulmonary pressures estimated at 41 mmHg.  Heart Catheterization: None     Assessment & Plan:     ICD-10-CM   1. Cough  R05 Pulmonary Function Test  2. Mild intermittent asthma without complication  A999333   3. Environmental and seasonal allergies  J30.89   4. Former smoker  Z87.891     Discussion:  This is a 78 year old female with environmental and seasonal allergies.  She has ongoing cough predominantly occurs at nighttime.  She has had mild intermittent asthma for many years treated with PRN albuterol.  She has not been on maintenance inhaler before.  She has not had any recent pulmonary function test.   She is a former smoker smoked approximately 1 to 2 packs/day for 25+ years.  Quit in her late 30s.  Like the best way to start her investigation would be to obtain full pulmonary function tests prior to her next visit. We will also start her on Symbicort 160, 2 puffs twice daily with spacer to see if this improves her symptoms. She can continue use of albuterol inhaler as needed for shortness of breath and wheezing. If she continues to have ongoing symptoms could consider regional allergy panel with IgE testing and absolute eosinophil count.  Patient to return to clinic in approximately 4 to 6 weeks  Greater than 50% of this patient's 45-minute of visit was been face-to-face discussing the above recommendations and treatment plan.    Current Outpatient Medications:  .  acetaminophen (TYLENOL) 325 MG tablet, Take 1-2 tablets (325-650 mg total) by mouth every 4 (four) hours as needed for mild pain., Disp: , Rfl:  .  albuterol (PROVENTIL HFA;VENTOLIN HFA) 108 (90 Base) MCG/ACT inhaler, Inhale 1-2 puffs into the lungs every 6 (six) hours as needed for wheezing or shortness of  breath (asthma/coughing)., Disp: , Rfl:  .  ALPRAZolam (XANAX) 0.25 MG tablet, Take 1 tablet (0.25 mg total) by mouth 3 (three) times daily as needed for anxiety., Disp: 30 tablet, Rfl: 0 .  escitalopram (LEXAPRO) 10 MG tablet, Take 1 tablet (10 mg total) by mouth at bedtime., Disp: 30 tablet, Rfl: 0 .  ferrous sulfate 325 (65 FE) MG EC tablet, Take 325 mg by mouth daily., Disp: , Rfl:  .  levothyroxine (SYNTHROID) 75 MCG tablet, Take 1 tablet (75 mcg total) by mouth daily before breakfast., Disp: 30 tablet, Rfl: 0 .  metaxalone (SKELAXIN) 800 MG tablet, Take 1 tablet (800 mg total) by mouth 3 (three) times daily as needed for muscle spasms., Disp: 60 tablet, Rfl: 0 .  metFORMIN (GLUCOPHAGE) 850 MG tablet, Take 850 mg by mouth 2 (two) times daily. , Disp: , Rfl:  .  montelukast (SINGULAIR) 10 MG tablet, Take 1 tablet (10 mg  total) by mouth at bedtime., Disp: 30 tablet, Rfl: 3 .  omeprazole (PRILOSEC) 40 MG capsule, TAKE 1 CAPSULE BY MOUTH ONCE DAILY 30 MINUTES BEFORE MORNING MEAL, Disp: , Rfl:  .  senna-docusate (SENOKOT-S) 8.6-50 MG tablet, Take 1 tablet by mouth 2 (two) times daily., Disp: , Rfl:  .  triamterene-hydrochlorothiazide (MAXZIDE-25) 37.5-25 MG tablet, , Disp: , Rfl:  .  VIRTUSSIN A/C 100-10 MG/5ML syrup, TAKE 5 ML BY MOUTH EVERY 6 HOURS AS NEEDED FOR COUGH FOR 5 DAYS, Disp: , Rfl:  .  vitamin B-12 (CYANOCOBALAMIN) 100 MCG tablet, Take 100 mcg by mouth daily., Disp: , Rfl:  .  Vitamin D, Ergocalciferol, (DRISDOL) 1.25 MG (50000 UT) CAPS capsule, TAKE 1 CAPSULE BY MOUTH ONCE A WEEK FOR 8 WEEKS WHEN FINISHED CAN TAKE 1000 UNITS OVER THE COUNTER DAILY, Disp: , Rfl:    Garner Nash, DO [facility] 11/26/2018 3:18 PM

## 2018-12-27 ENCOUNTER — Telehealth: Payer: Self-pay | Admitting: Pulmonary Disease

## 2019-01-09 NOTE — Telephone Encounter (Signed)
L/m on pt vm to call back to schedule pft -pr  °

## 2019-01-10 NOTE — Telephone Encounter (Signed)
Pt scheduled for pft on 03/10/2019-pr

## 2019-03-04 ENCOUNTER — Telehealth: Payer: Self-pay | Admitting: Adult Health

## 2019-03-04 NOTE — Telephone Encounter (Signed)
LM on pt's VM to let her know I moved her COVID appt time to 11 AM on 12/4.  Nothing further needed at this time.

## 2019-03-07 ENCOUNTER — Other Ambulatory Visit (HOSPITAL_COMMUNITY): Payer: Federal, State, Local not specified - PPO

## 2019-03-07 ENCOUNTER — Other Ambulatory Visit (HOSPITAL_COMMUNITY)
Admission: RE | Admit: 2019-03-07 | Discharge: 2019-03-07 | Disposition: A | Discharge status: Home / Self Care | Payer: Federal, State, Local not specified - PPO | Source: Ambulatory Visit | Attending: Pulmonary Disease | Admitting: Pulmonary Disease

## 2019-03-07 DIAGNOSIS — Z20828 Contact with and (suspected) exposure to other viral communicable diseases: Secondary | ICD-10-CM | POA: Diagnosis not present

## 2019-03-07 DIAGNOSIS — Z01812 Encounter for preprocedural laboratory examination: Secondary | ICD-10-CM | POA: Insufficient documentation

## 2019-03-07 LAB — SARS CORONAVIRUS 2 (TAT 6-24 HRS): SARS Coronavirus 2: NEGATIVE

## 2019-03-10 ENCOUNTER — Ambulatory Visit (INDEPENDENT_AMBULATORY_CARE_PROVIDER_SITE_OTHER): Payer: Federal, State, Local not specified - PPO | Admitting: Pulmonary Disease

## 2019-03-10 ENCOUNTER — Ambulatory Visit: Payer: Federal, State, Local not specified - PPO | Admitting: Adult Health

## 2019-03-10 ENCOUNTER — Encounter: Payer: Self-pay | Admitting: Adult Health

## 2019-03-10 ENCOUNTER — Other Ambulatory Visit: Payer: Self-pay

## 2019-03-10 DIAGNOSIS — R05 Cough: Secondary | ICD-10-CM | POA: Diagnosis not present

## 2019-03-10 DIAGNOSIS — R059 Cough, unspecified: Secondary | ICD-10-CM

## 2019-03-10 DIAGNOSIS — J4530 Mild persistent asthma, uncomplicated: Secondary | ICD-10-CM

## 2019-03-10 DIAGNOSIS — R053 Chronic cough: Secondary | ICD-10-CM

## 2019-03-10 LAB — PULMONARY FUNCTION TEST
DL/VA % pred: 150 %
DL/VA: 6.26 ml/min/mmHg/L
DLCO unc % pred: 126 %
DLCO unc: 21.9 ml/min/mmHg
FEF 25-75 Pre: 1.76 L/sec
FEF2575-%Pred-Pre: 128 %
FEV1-%Pred-Pre: 111 %
FEV1-Pre: 1.98 L
FEV1FVC-%Pred-Pre: 105 %
FEV6-%Pred-Pre: 110 %
FEV6-Pre: 2.51 L
FEV6FVC-%Pred-Pre: 104 %
FVC-%Pred-Pre: 105 %
FVC-Pre: 2.53 L
Pre FEV1/FVC ratio: 78 %
Pre FEV6/FVC Ratio: 99 %

## 2019-03-10 NOTE — Progress Notes (Signed)
PFT completed today.  

## 2019-03-10 NOTE — Assessment & Plan Note (Addendum)
Chronic cough-present for greater than 10 to 12 years possibly secondary to chronic rhinitis/postnasal drainage.  No significant change in symptoms with Symbicort.  Pulmonary function testing is normal with no airflow obstruction or restriction.  DLCO is normal.  Chest x-ray showed clear lungs.  Do not feel that high-resolution CT chest is indicated at this time.  However if cough persists could consider a CT chest to evaluate lung parenchyma.  For now we will add in cough suppression regimen and Claritin for postnasal drainage Consider IgE and CBC with differential on return  Plan  Patient Instructions  Continue on Allegra 180mg  daily  Begin Saline Nasal Rinses Twice daily  .  Begin Saline nasal gel At bedtime  .  Begin Deslym 2 tsp Twice daily  As needed  Cough.  Ventolin 2 puffs every 4-6 hrs as needed wheezing  Follow up with Dr. Valeta Harms in 3 months and As needed   Please contact office for sooner follow up if symptoms do not improve or worsen or seek emergency care

## 2019-03-10 NOTE — Assessment & Plan Note (Signed)
Mild persistent asthma-present since childhood.  Despite previous smoking and asthma along with premature birth patient has normal lung function with no airflow obstruction or restriction.  Her DLCO is normal.  Chest x-ray earlier this year in July showed clear lungs.  Suspect ongoing cough may be secondary to possible postnasal drainage/chronic rhinitis.  We will add in preventative regimen and cough control medications  Plan  Patient Instructions  Continue on Allegra 180mg  daily  Begin Saline Nasal Rinses Twice daily  .  Begin Saline nasal gel At bedtime  .  Begin Deslym 2 tsp Twice daily  As needed  Cough.  Ventolin 2 puffs every 4-6 hrs as needed wheezing  Follow up with Dr. Valeta Harms in 3 months and As needed   Please contact office for sooner follow up if symptoms do not improve or worsen or seek emergency care

## 2019-03-10 NOTE — Progress Notes (Signed)
@Patient  ID: Regina [name], female    DOB: 1941-01-12, 78 y.o.   MRN: [id number]  Chief Complaint  Patient presents with  . Follow-up    Asthma     Referring provider: Maurice Small, MD  HPI: 78 year old female former smoker seen for pulmonary consult November 26, 2018 for asthma and chronic cough Medical history significant for hypertension, hypothyroidism-Hashimoto's, diabetes  TEST/EVENTS :   03/10/2019 Follow up : Asthma , Chronic Cough  Patient returns for a 47-month follow-up.  Patient was seen last visit for a pulmonary consult for asthma and chronic cough.  Patient says she was diagnosed with asthma as a young child.  She complains that she has had asthma trouble her entire life.  Over the last 10 to 12 years she has had a daily ongoing dry cough.  She says she does have postnasal drainage.  She says she takes Allegra but does not see significant benefit.  She is not using anything for cough.  Has some shortness of breath but is not very active. Last visit patient was started on Symbicort as she took it for several weeks but did not feel like it made any change in her breathing and or help her cough.  She stopped this few weeks ago. Patient said as a child she had recurrent pneumonia.  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   ff PFTs today show normal lung function with an FEV1 at 111%, ratio 78, FVC 105%, DLCO 126%.  Patient was unable to perform the postbronchodilator lung volumes.. Flow volume loops were normal.   Allergies  Allergen Reactions  . Ceclor [Cefaclor] Rash  . Naproxen Hives and Swelling    Mouth swelling  . Sulfa Antibiotics Nausea And Vomiting and Other (See Comments)    Syncope/headaches   . Amlodipine Cough  . Augmentin [Amoxicillin-Pot Clavulanate] Nausea And Vomiting    Has patient had a PCN reaction causing immediate rash, facial/tongue/throat swelling, SOB or lightheadedness with hypotension: No Has patient had a PCN reaction causing severe  rash involving mucus membranes or skin necrosis: No Has patient had a PCN reaction that required hospitalization: No Has patient had a PCN reaction occurring within the last 10 years: No If all of the above answers are "NO", then may proceed with Cephalosporin use.   . Other Nausea And Vomiting    "pain medications" or opoids/Anesthesia  . Valsartan Cough    Immunization History  Administered Date(s) Administered  . Influenza, High Dose Seasonal PF 12/07/2017, 01/27/2019    Past Medical History:  Diagnosis Date  . Anxiety   . Arthritis    neck  . Asthma   . Cervical spondylolysis   . Cervical spondylolysis   . Depression   . Hashimoto's disease   . Insulin resistance   . PONV (postoperative nausea and vomiting)    hasn't had surgery since 1993    Tobacco History: Social History   Tobacco Use  Smoking Status Former Smoker  . Quit date: 61  . Years since quitting: 38.9  Smokeless Tobacco Never Used  Tobacco Comment   Smoked 16-40ish, 1-2 ppd   Counseling given: Not Answered Comment: Smoked 16-40ish, 1-2 ppd   Outpatient Medications Prior to Visit  Medication Sig Dispense Refill  . acetaminophen (TYLENOL) 325 MG tablet Take 1-2 tablets (325-650 mg total) by mouth every 4 (four) hours as needed for mild pain.    Marland Kitchen albuterol (PROVENTIL HFA;VENTOLIN HFA) 108 (90 Base) MCG/ACT inhaler Inhale 1-2 puffs into the lungs  every 6 (six) hours as needed for wheezing or shortness of breath (asthma/coughing).    . ALPRAZolam (XANAX) 0.25 MG tablet Take 1 tablet (0.25 mg total) by mouth 3 (three) times daily as needed for anxiety. 30 tablet 0  . budesonide-formoterol (SYMBICORT) 160-4.5 MCG/ACT inhaler Inhale 2 puffs into the lungs every 12 (twelve) hours. 1 Inhaler 0  . escitalopram (LEXAPRO) 10 MG tablet Take 1 tablet (10 mg total) by mouth at bedtime. 30 tablet 0  . ferrous sulfate 325 (65 FE) MG EC tablet Take 325 mg by mouth daily.    Marland Kitchen levothyroxine (SYNTHROID) 75 MCG  tablet Take 1 tablet (75 mcg total) by mouth daily before breakfast. 30 tablet 0  . metaxalone (SKELAXIN) 800 MG tablet Take 1 tablet (800 mg total) by mouth 3 (three) times daily as needed for muscle spasms. 60 tablet 0  . metFORMIN (GLUCOPHAGE) 850 MG tablet Take 850 mg by mouth 2 (two) times daily.     Marland Kitchen omeprazole (PRILOSEC) 40 MG capsule TAKE 1 CAPSULE BY MOUTH ONCE DAILY 30 MINUTES BEFORE MORNING MEAL    . senna-docusate (SENOKOT-S) 8.6-50 MG tablet Take 1 tablet by mouth 2 (two) times daily.    Marland Kitchen VIRTUSSIN A/C 100-10 MG/5ML syrup TAKE 5 ML BY MOUTH EVERY 6 HOURS AS NEEDED FOR COUGH FOR 5 DAYS    . vitamin B-12 (CYANOCOBALAMIN) 100 MCG tablet Take 100 mcg by mouth daily.    . Vitamin D, Ergocalciferol, (DRISDOL) 1.25 MG (50000 UT) CAPS capsule TAKE 1 CAPSULE BY MOUTH ONCE A WEEK FOR 8 WEEKS WHEN FINISHED CAN TAKE 1000 UNITS OVER THE COUNTER DAILY    . montelukast (SINGULAIR) 10 MG tablet Take 1 tablet (10 mg total) by mouth at bedtime. (Patient not taking: Reported on 03/10/2019) 30 tablet 3  . triamterene-hydrochlorothiazide (MAXZIDE-25) 37.5-25 MG tablet      No facility-administered medications prior to visit.      Review of Systems:   Constitutional:   No  weight loss, night sweats,  Fevers, chills, + fatigue, or  lassitude.  HEENT:   No headaches,  Difficulty swallowing,  Tooth/dental problems, or  Sore throat,                No sneezing, itching, ear ache,  +nasal congestion, post nasal drip,   CV:  No chest pain,  Orthopnea, PND, swelling in lower extremities, anasarca, dizziness, palpitations, syncope.   GI  No heartburn, indigestion, abdominal pain, nausea, vomiting, diarrhea, change in bowel habits, loss of appetite, bloody stools.   Resp:   No chest wall deformity  Skin: no rash or lesions.  GU: no dysuria, change in color of urine, no urgency or frequency.  No flank pain, no hematuria   MS:  No joint pain or swelling.  No decreased range of motion.  No back pain.     Physical Exam  BP 122/62 (BP Location: Left Arm, Cuff Size: Normal)   Pulse 86   Ht 5' 1.5" (1.562 m)   Wt 135 lb 9.6 oz (61.5 kg)   SpO2 98% Comment: RA  BMI 25.21 kg/m   GEN: A/Ox3; pleasant , NAD, elderly, cane   HEENT:  [place]/AT,   , NOSE-clear, THROAT-clear, no lesions, no postnasal drip or exudate noted.   NECK:  Supple w/ fair ROM; no JVD; normal carotid impulses w/o bruits; no thyromegaly or nodules palpated; no lymphadenopathy.    RESP  Clear  P & A; w/o, wheezes/ rales/ or rhonchi. no accessory muscle use,  no dullness to percussion  CARD:  RRR, no m/r/g, no peripheral edema, pulses intact, no cyanosis or clubbing.  GI:   Soft & nt; nml bowel sounds; no organomegaly or masses detected.   Musco: Warm bil, no deformities or joint swelling noted.   Neuro: alert, no focal deficits noted.    Skin: Warm, no lesions or rashes    Lab Results:  CBC   BMET  BNP No results found for: BNP  ProBNP No results found for: PROBNP  Imaging: No results found.    PFT Results Latest Ref Rng & Units 03/10/2019  FVC-Pre L 2.53  FVC-Predicted Pre % 105  Pre FEV1/FVC % % 78  FEV1-Pre L 1.98  FEV1-Predicted Pre % 111  DLCO UNC% % 126  DLCO COR %Predicted % 150    No results found for: NITRICOXIDE      Assessment & Plan:   No problem-specific Assessment & Plan notes found for this encounter.     Rexene Edison, NP 03/10/2019

## 2019-03-10 NOTE — Patient Instructions (Addendum)
Continue on Allegra 180mg  daily  Begin Saline Nasal Rinses Twice daily  .  Begin Saline nasal gel At bedtime  .  Begin Deslym 2 tsp Twice daily  As needed  Cough.  Ventolin 2 puffs every 4-6 hrs as needed wheezing  Follow up with Dr. Valeta Harms in 3 months and As needed   Please contact office for sooner follow up if symptoms do not improve or worsen or seek emergency care

## 2019-04-22 ENCOUNTER — Ambulatory Visit: Payer: Federal, State, Local not specified - PPO | Attending: Internal Medicine

## 2019-04-22 DIAGNOSIS — Z23 Encounter for immunization: Secondary | ICD-10-CM | POA: Insufficient documentation

## 2019-04-22 NOTE — Progress Notes (Signed)
   Covid-19 Vaccination Clinic  Name:  Regina [name]    MRN: [id number] DOB: [birth year]  04/22/2019  Ms. Dupuy was observed post Covid-19 immunization for 15 minutes without incidence. She was provided with Vaccine Information Sheet and instruction to access the V-Safe system.   Ms. Dudeck was instructed to call 911 with any severe reactions post vaccine: Marland Kitchen Difficulty breathing  . Swelling of your face and throat  . A fast heartbeat  . A bad rash all over your body  . Dizziness and weakness    Immunizations Administered    Name Date Dose VIS Date Route   Pfizer COVID-19 Vaccine 04/22/2019 10:30 AM 0.3 mL 03/14/2019 Intramuscular   Manufacturer: [employer], [employer]   Lot: [id number]   [place]: [id number]

## 2019-05-13 ENCOUNTER — Ambulatory Visit: Payer: Federal, State, Local not specified - PPO | Attending: Internal Medicine

## 2019-05-13 DIAGNOSIS — Z23 Encounter for immunization: Secondary | ICD-10-CM

## 2019-05-13 NOTE — Progress Notes (Signed)
   Covid-19 Vaccination Clinic  Name:  Regina [name]    MRN: [id number] DOB: 06-10-1940  05/13/2019  Ms. Kendall was observed post Covid-19 immunization for 15 minutes without incidence. She was provided with Vaccine Information Sheet and instruction to access the V-Safe system.   Ms. Swartwout was instructed to call 911 with any severe reactions post vaccine: Marland Kitchen Difficulty breathing  . Swelling of your face and throat  . A fast heartbeat  . A bad rash all over your body  . Dizziness and weakness    Immunizations Administered    Name Date Dose VIS Date Route   Pfizer COVID-19 Vaccine 05/13/2019 10:37 AM 0.3 mL 03/14/2019 Intramuscular   Manufacturer: [place]   Lot: [id number]   [place]: [id number]

## 2019-10-21 ENCOUNTER — Encounter: Payer: Self-pay | Admitting: Neurology

## 2020-01-13 NOTE — Progress Notes (Deleted)
NEUROLOGY CONSULTATION NOTE  MACON LESESNE MRN: [id number] DOB: Mar 30, 1941  Referring provider: Maurice Small, MD Primary care provider: Maurice Small, MD  Reason for consult:  ***  HISTORY OF PRESENT ILLNESS: Regina [name] is a 79 year old ***-handed female with Hashimoto's and history of cervical spondylosis s/p ACDF who presents for ***.  In 2019, she developed bilateral upper extremity weakness.  MRI of cervical spine performed on 11/15/2017 was personally reviewed and demonstrated multilevel spondylosis with spinal stenosis at C3-4 through C5-6 causing spinal cord compression.  She was admitted to the hospital on 12/01/2017 and underwent ACDF C3-4, C4-5 and C5-6 on 12/06/2017 with some improvement in strength.  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  Following discharge, she underwent extensive physical therapy ***.  Labs from June include ferritin 23, TSH 3.12 and B12 over 2000.    11/15/2017 MRI CERVICAL:  1. 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  2. Multilevel advanced disc and endplate degeneration, and also advanced upper cervical facet arthropathy in the setting of mild C3-C4 spondylolisthesis.  At C4-C5 bulky midline disc is reminiscent of abnormal posterior longitudinal ligament, but no strong evidence of ossification of the posterior longitudinal ligament (OPLL). 3. Mild degenerative spinal stenosis at C6-C7.  4. Moderate or severe neural foraminal stenosis at the right C3, and bilateral C4 through C7 nerve levels.  PAST MEDICAL HISTORY: Past Medical History:  Diagnosis Date  . Anxiety   . Arthritis    neck  . Asthma   . Cervical spondylolysis   . Cervical spondylolysis   . Depression   . Hashimoto's disease   .  Insulin resistance   . PONV (postoperative nausea and vomiting)    hasn't had surgery since 1993    PAST SURGICAL HISTORY: Past Surgical History:  Procedure Laterality Date  . ANTERIOR CERVICAL DECOMP/DISCECTOMY FUSION N/A 12/06/2017   Procedure: ANTERIOR CERVICAL DECOMPRESSION/DISCECTOMY FUSION, CERVICAL 3- CERVICAL 4, CERVICAL 4- CERVICAL 5, CERVICAL 5- CERVICAL 6;  Surgeon: [name], MD;  Location: [facility];  Service: Neurosurgery;  Laterality: N/A;  ANTERIOR CERVICAL DECOMPRESSION/DISCECTOMY FUSION, CERVICAL THREE- CERVICAL FOUR, CERVICAL FOUR- CERVICAL FIVE, CERVICAL FIVE- CERVICAL SIX  . APPENDECTOMY    . BREAST SURGERY     left biopsy  . CESAREAN SECTION     x2  . CHOLECYSTECTOMY    . DENTAL SURGERY     teeth removal  . OVARY SURGERY     ovarian wedge  . TONSILLECTOMY      MEDICATIONS: Current Outpatient Medications on File Prior to Visit  Medication Sig Dispense Refill  . acetaminophen (TYLENOL) 325 MG tablet Take 1-2 tablets (325-650 mg total) by mouth every 4 (four) hours as needed for mild pain.    Marland Kitchen albuterol (PROVENTIL HFA;VENTOLIN HFA) 108 (90 Base) MCG/ACT inhaler Inhale 1-2 puffs into the lungs every 6 (six) hours as needed for wheezing or shortness of breath (asthma/coughing).    . ALPRAZolam (XANAX) 0.25 MG tablet Take 1 tablet (0.25 mg total) by mouth 3 (three) times daily as needed for anxiety. 30 tablet 0  . budesonide-formoterol (SYMBICORT) 160-4.5 MCG/ACT inhaler Inhale 2 puffs into the lungs every 12 (twelve) hours. 1 Inhaler 0  . escitalopram (LEXAPRO) 10 MG tablet Take 1 tablet (10 mg total) by mouth at bedtime. 30 tablet 0  . ferrous sulfate 325 (  65 FE) MG EC tablet Take 325 mg by mouth daily.    Marland Kitchen levothyroxine (SYNTHROID) 75 MCG tablet Take 1 tablet (75 mcg total) by mouth daily before breakfast. 30 tablet 0  . metaxalone (SKELAXIN) 800 MG tablet Take 1 tablet (800 mg total) by mouth 3 (three) times daily as needed for muscle spasms. 60 tablet 0  .  metFORMIN (GLUCOPHAGE) 850 MG tablet Take 850 mg by mouth 2 (two) times daily.     . montelukast (SINGULAIR) 10 MG tablet Take 1 tablet (10 mg total) by mouth at bedtime. (Patient not taking: Reported on 03/10/2019) 30 tablet 3  . omeprazole (PRILOSEC) 40 MG capsule TAKE 1 CAPSULE BY MOUTH ONCE DAILY 30 MINUTES BEFORE MORNING MEAL    . senna-docusate (SENOKOT-S) 8.6-50 MG tablet Take 1 tablet by mouth 2 (two) times daily.    Marland Kitchen triamterene-hydrochlorothiazide (MAXZIDE-25) 37.5-25 MG tablet     . VIRTUSSIN A/C 100-10 MG/5ML syrup TAKE 5 ML BY MOUTH EVERY 6 HOURS AS NEEDED FOR COUGH FOR 5 DAYS    . vitamin B-12 (CYANOCOBALAMIN) 100 MCG tablet Take 100 mcg by mouth daily.    . Vitamin D, Ergocalciferol, (DRISDOL) 1.25 MG (50000 UT) CAPS capsule TAKE 1 CAPSULE BY MOUTH ONCE A WEEK FOR 8 WEEKS WHEN FINISHED CAN TAKE 1000 UNITS OVER THE COUNTER DAILY     No current facility-administered medications on file prior to visit.    ALLERGIES: Allergies  Allergen Reactions  . Ceclor [Cefaclor] Rash  . Naproxen Hives and Swelling    Mouth swelling  . Sulfa Antibiotics Nausea And Vomiting and Other (See Comments)    Syncope/headaches   . Amlodipine Cough  . Augmentin [Amoxicillin-Pot Clavulanate] Nausea And Vomiting    Has patient had a PCN reaction causing immediate rash, facial/tongue/throat swelling, SOB or lightheadedness with hypotension: No Has patient had a PCN reaction causing severe rash involving mucus membranes or skin necrosis: No Has patient had a PCN reaction that required hospitalization: No Has patient had a PCN reaction occurring within the last 10 years: No If all of the above answers are "NO", then may proceed with Cephalosporin use.   . Other Nausea And Vomiting    "pain medications" or opoids/Anesthesia  . Valsartan Cough    FAMILY HISTORY: Family History  Problem Relation Age of Onset  . COPD Mother   . Asthma Father   . Heart disease Father   . Asthma Paternal Grandfather     ***.  SOCIAL HISTORY: Social History   Socioeconomic History  . Marital status: Married    Spouse name: Not on file  . Number of children: Not on file  . Years of education: Not on file  . Highest education level: Not on file  Occupational History  . Not on file  Tobacco Use  . Smoking status: Former Smoker    Quit date: 1982    Years since quitting: 39.8  . Smokeless tobacco: Never Used  . Tobacco comment: Smoked 16-40ish, 1-2 ppd  Vaping Use  . Vaping Use: Never used  Substance and Sexual Activity  . Alcohol use: Yes    Comment: very rare  . Drug use: Never  . Sexual activity: Not on file  Other Topics Concern  . Not on file  Social History Narrative  . Not on file   Social Determinants of Health   Financial Resource Strain:   . Difficulty of Paying Living Expenses: Not on file  Food Insecurity:   . Worried About Running  Out of Food in the Last Year: Not on file  . Ran Out of Food in the Last Year: Not on file  Transportation Needs:   . Lack of Transportation (Medical): Not on file  . Lack of Transportation (Non-Medical): Not on file  Physical Activity:   . Days of Exercise per Week: Not on file  . Minutes of Exercise per Session: Not on file  Stress:   . Feeling of Stress : Not on file  Social Connections:   . Frequency of Communication with Friends and Family: Not on file  . Frequency of Social Gatherings with Friends and Family: Not on file  . Attends Religious Services: Not on file  . Active Member of Clubs or Organizations: Not on file  . Attends Archivist Meetings: Not on file  . Marital Status: Not on file  Intimate Partner Violence:   . Fear of Current or Ex-Partner: Not on file  . Emotionally Abused: Not on file  . Physically Abused: Not on file  . Sexually Abused: Not on file    REVIEW OF SYSTEMS: Constitutional: No fevers, chills, or sweats, no generalized fatigue, change in appetite Eyes: No visual changes, double vision, eye  pain Ear, nose and throat: No hearing loss, ear pain, nasal congestion, sore throat Cardiovascular: No chest pain, palpitations Respiratory:  No shortness of breath at rest or with exertion, wheezes GastrointestinaI: No nausea, vomiting, diarrhea, abdominal pain, fecal incontinence Genitourinary:  No dysuria, urinary retention or frequency Musculoskeletal:  No neck pain, back pain Integumentary: No rash, pruritus, skin lesions Neurological: as above Psychiatric: No depression, insomnia, anxiety Endocrine: No palpitations, fatigue, diaphoresis, mood swings, change in appetite, change in weight, increased thirst Hematologic/Lymphatic:  No purpura, petechiae. Allergic/Immunologic: no itchy/runny eyes, nasal congestion, recent allergic reactions, rashes  PHYSICAL EXAM: *** General: No acute distress.  Patient appears ***-groomed.  *** Head:  Normocephalic/atraumatic Eyes:  fundi examined but not visualized Neck: supple, no paraspinal tenderness, full range of motion Back: No paraspinal tenderness Heart: regular rate and rhythm Lungs: Clear to auscultation bilaterally. Vascular: No carotid bruits. Neurological Exam: Mental status: alert and oriented to person, place, and time, recent and remote memory intact, fund of knowledge intact, attention and concentration intact, speech fluent and not dysarthric, language intact. Cranial nerves: CN I: not tested CN II: pupils equal, round and reactive to light, visual fields intact CN III, IV, VI:  full range of motion, no nystagmus, no ptosis CN V: facial sensation intact CN VII: upper and lower face symmetric CN VIII: hearing intact CN IX, X: gag intact, uvula midline CN XI: sternocleidomastoid and trapezius muscles intact CN XII: tongue midline Bulk & Tone: normal, no fasciculations. Motor:  5/5 throughout *** Sensation:  Pinprick *** temperature *** and vibration sensation intact.  ***. Deep Tendon Reflexes:  2+ throughout, *** toes  downgoing.  *** Finger to nose testing:  Without dysmetria.  *** Heel to shin:  Without dysmetria.  *** Gait:  Normal station and stride.  Able to turn and tandem walk. Romberg ***.  IMPRESSION: ***  PLAN: ***  Thank you for allowing me to take part in the care of this patient.  Metta Clines, DO  CC: ***

## 2020-01-15 ENCOUNTER — Ambulatory Visit: Payer: Federal, State, Local not specified - PPO | Admitting: Neurology

## 2020-01-17 ENCOUNTER — Ambulatory Visit: Payer: Federal, State, Local not specified - PPO | Attending: Internal Medicine

## 2020-01-17 DIAGNOSIS — Z23 Encounter for immunization: Secondary | ICD-10-CM

## 2020-01-17 NOTE — Progress Notes (Signed)
   Covid-19 Vaccination Clinic  Name:  Regina [name]    MRN: [id number] DOB: [birth year]  01/17/2020  [name] was observed post Covid-19 immunization for 15 minutes without incident. She was provided with Vaccine Information Sheet and instruction to access the V-Safe system.   [name] was instructed to call 911 with any severe reactions post vaccine: Marland Kitchen Difficulty breathing  . Swelling of face and throat  . A fast heartbeat  . A bad rash all over body  . Dizziness and weakness

## 2020-02-04 NOTE — Progress Notes (Signed)
NEUROLOGY CONSULTATION NOTE  Regina [name] MRN: [id number] DOB: 1940-06-03  Referring provider: Maurice Small, MD Primary care provider: Maurice Small, MD  Reason for consult:  Slow walking, change in handwriting, numbness  HISTORY OF PRESENT ILLNESS: Regina [name] is a 79 year old right-handed female with cervical spondylosis, CKD stage 2, acquired hypothyroidism s/p Hashimoto's disease, diabetes and history of stroke who presents for evaluation of slow walking, change in handwriting, and numbness.  PCP records reviewed.  Fingers and hands and feet are numb.  Raynaud's (white-purple and bright red).  Before neck surgery . March -  Balance is a little better.  Feet stable.  Hands worse.  Warm pain.  Can't feel heat.    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  At that time, she had an episode of syncope secondary to hyponatremia and dehydration.  She underwent ACDF C3-4, C4-5 and C5-6 on 12/06/2017 with reported somewhat improvement in bilateral upper extremity strength. Following surgery she was admitted for comprehensive rehab.  At time of discharge, she still exhibited 4+/5 upper extremity with 3+/5 hand grip and 4+/5 bilateral lower extremities.  She underwent physical therapy which stopped in March 2020 at onset of COVID-19 pandemic.  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  Even before the surgery, she reports numbness in her hands and feet.  At times, her fingers turn from white to red to blue with associated burning and aching pain.  She reports family history of Raynaud's.  The numbness makes it difficulty to use her  hands.  She has history of polycythemia vera controlled with iron, acquired hypothyroidism and history of controlled insulin resistance.    09/24/2019 LABS:  Ferritin 23; TSH 3.120; B12 >2000; CBC with WBC 8.6, HGB 13.6, HCT 40.7, PLT 253; CMP with Na 132, K 5.4, Cl 94, CO2 20, Ca 9.7, glucose 93, BUN 14, Cr 0.82, t bili 0.5, ALP 76, AST 8, ALT 7.    PAST MEDICAL HISTORY: Past Medical History:  Diagnosis Date  . Anxiety   . Arthritis    neck  . Asthma   . Cervical spondylolysis   . Cervical spondylolysis   . Depression   . Hashimoto's disease   . Insulin resistance   . PONV (postoperative nausea and vomiting)    hasn't had surgery since 1993    PAST SURGICAL HISTORY: Past Surgical History:  Procedure Laterality Date  . ANTERIOR CERVICAL DECOMP/DISCECTOMY FUSION N/A 12/06/2017   Procedure: ANTERIOR CERVICAL DECOMPRESSION/DISCECTOMY FUSION, CERVICAL 3- CERVICAL 4, CERVICAL 4- CERVICAL 5, CERVICAL 5- CERVICAL 6;  Surgeon: [name], MD;  Location: [facility];  Service: Neurosurgery;  Laterality: N/A;  ANTERIOR CERVICAL DECOMPRESSION/DISCECTOMY FUSION, CERVICAL THREE- CERVICAL FOUR, CERVICAL FOUR- CERVICAL FIVE, CERVICAL FIVE- CERVICAL SIX  . APPENDECTOMY    . BREAST SURGERY     left biopsy  . CESAREAN SECTION     x2  . CHOLECYSTECTOMY    . DENTAL SURGERY     teeth removal  . OVARY SURGERY     ovarian wedge  . TONSILLECTOMY      MEDICATIONS: Current Outpatient Medications on File Prior to Visit  Medication Sig Dispense Refill  . acetaminophen (TYLENOL)  325 MG tablet Take 1-2 tablets (325-650 mg total) by mouth every 4 (four) hours as needed for mild pain.    Marland Kitchen albuterol (PROVENTIL HFA;VENTOLIN HFA) 108 (90 Base) MCG/ACT inhaler Inhale 1-2 puffs into the lungs every 6 (six) hours as needed for wheezing or shortness of breath (asthma/coughing).    . ALPRAZolam (XANAX) 0.25 MG tablet Take 1 tablet (0.25 mg total) by mouth 3 (three) times daily as needed for anxiety. 30 tablet 0    . budesonide-formoterol (SYMBICORT) 160-4.5 MCG/ACT inhaler Inhale 2 puffs into the lungs every 12 (twelve) hours. 1 Inhaler 0  . escitalopram (LEXAPRO) 10 MG tablet Take 1 tablet (10 mg total) by mouth at bedtime. 30 tablet 0  . ferrous sulfate 325 (65 FE) MG EC tablet Take 325 mg by mouth daily.    Marland Kitchen levothyroxine (SYNTHROID) 75 MCG tablet Take 1 tablet (75 mcg total) by mouth daily before breakfast. 30 tablet 0  . metaxalone (SKELAXIN) 800 MG tablet Take 1 tablet (800 mg total) by mouth 3 (three) times daily as needed for muscle spasms. 60 tablet 0  . metFORMIN (GLUCOPHAGE) 850 MG tablet Take 850 mg by mouth 2 (two) times daily.     . montelukast (SINGULAIR) 10 MG tablet Take 1 tablet (10 mg total) by mouth at bedtime. (Patient not taking: Reported on 03/10/2019) 30 tablet 3  . omeprazole (PRILOSEC) 40 MG capsule TAKE 1 CAPSULE BY MOUTH ONCE DAILY 30 MINUTES BEFORE MORNING MEAL    . senna-docusate (SENOKOT-S) 8.6-50 MG tablet Take 1 tablet by mouth 2 (two) times daily.    Marland Kitchen triamterene-hydrochlorothiazide (MAXZIDE-25) 37.5-25 MG tablet     . VIRTUSSIN A/C 100-10 MG/5ML syrup TAKE 5 ML BY MOUTH EVERY 6 HOURS AS NEEDED FOR COUGH FOR 5 DAYS    . vitamin B-12 (CYANOCOBALAMIN) 100 MCG tablet Take 100 mcg by mouth daily.    . Vitamin D, Ergocalciferol, (DRISDOL) 1.25 MG (50000 UT) CAPS capsule TAKE 1 CAPSULE BY MOUTH ONCE A WEEK FOR 8 WEEKS WHEN FINISHED CAN TAKE 1000 UNITS OVER THE COUNTER DAILY     No current facility-administered medications on file prior to visit.    ALLERGIES: Allergies  Allergen Reactions  . Ceclor [Cefaclor] Rash  . Naproxen Hives and Swelling    Mouth swelling  . Sulfa Antibiotics Nausea And Vomiting and Other (See Comments)    Syncope/headaches   . Amlodipine Cough  . Augmentin [Amoxicillin-Pot Clavulanate] Nausea And Vomiting    Has patient had a PCN reaction causing immediate rash, facial/tongue/throat swelling, SOB or lightheadedness with hypotension: No Has  patient had a PCN reaction causing severe rash involving mucus membranes or skin necrosis: No Has patient had a PCN reaction that required hospitalization: No Has patient had a PCN reaction occurring within the last 10 years: No If all of the above answers are "NO", then may proceed with Cephalosporin use.   . Other Nausea And Vomiting    "pain medications" or opoids/Anesthesia  . Valsartan Cough    FAMILY HISTORY: Family History  Problem Relation Age of Onset  . COPD Mother   . Asthma Father   . Heart disease Father   . Asthma Paternal Grandfather     SOCIAL HISTORY: Social History   Socioeconomic History  . Marital status: Married    Spouse name: Not on file  . Number of children: Not on file  . Years of education: Not on file  . Highest education level: Not on file  Occupational History  .  Not on file  Tobacco Use  . Smoking status: Former Smoker    Quit date: 1982    Years since quitting: 39.8  . Smokeless tobacco: Never Used  . Tobacco comment: Smoked 16-40ish, 1-2 ppd  Vaping Use  . Vaping Use: Never used  Substance and Sexual Activity  . Alcohol use: Yes    Comment: very rare  . Drug use: Never  . Sexual activity: Not on file  Other Topics Concern  . Not on file  Social History Narrative  . Not on file   Social Determinants of Health   Financial Resource Strain:   . Difficulty of Paying Living Expenses: Not on file  Food Insecurity:   . Worried About Charity fundraiser in the Last Year: Not on file  . Ran Out of Food in the Last Year: Not on file  Transportation Needs:   . Lack of Transportation (Medical): Not on file  . Lack of Transportation (Non-Medical): Not on file  Physical Activity:   . Days of Exercise per Week: Not on file  . Minutes of Exercise per Session: Not on file  Stress:   . Feeling of Stress : Not on file  Social Connections:   . Frequency of Communication with Friends and Family: Not on file  . Frequency of Social Gatherings  with Friends and Family: Not on file  . Attends Religious Services: Not on file  . Active Member of Clubs or Organizations: Not on file  . Attends Archivist Meetings: Not on file  . Marital Status: Not on file  Intimate Partner Violence:   . Fear of Current or Ex-Partner: Not on file  . Emotionally Abused: Not on file  . Physically Abused: Not on file  . Sexually Abused: Not on file   PHYSICAL EXAM: Blood pressure (!) 146/78, pulse 86, resp. rate 18, height 5\' 2"  (1.575 m), weight 129 lb (58.5 kg), SpO2 98 %. General: No acute distress.  Patient appears well-groomed.   Head:  Normocephalic/atraumatic Eyes:  fundi examined but not visualized Neck: supple, no paraspinal tenderness, full range of motion Back: No paraspinal tenderness Heart: regular rate and rhythm Lungs: Clear to auscultation bilaterally. Vascular: No carotid bruits. Neurological Exam: Mental status: alert and oriented to person, place, and time, recent and remote memory intact, fund of knowledge intact, attention and concentration intact, speech fluent and not dysarthric, language intact. Cranial nerves: CN I: not tested CN II: pupils equal, round and reactive to light, visual fields intact CN III, IV, VI:  full range of motion, no nystagmus, no ptosis CN V: facial sensation intact CN VII: upper and lower face symmetric CN VIII: hearing intact CN IX, X: gag intact, uvula midline CN XI: sternocleidomastoid and trapezius muscles intact CN XII: tongue midline Bulk & Tone: normal, no fasciculations. Motor:  5/5 throughout.  Decreased finger-thumb tapping speed and amplitude bilaterally Sensation:  Pinprick sensation reduced in feet up to ankles, vibratory sensation reduced up to knees. Deep Tendon Reflexes:  3+ throughout, bilateral Hoffman, toes downgoing.   Finger to nose testing:  Without dysmetria. Heel to shin:  Without dysmetria.   Gait:  Cautious wide-based unsteady gait.  Romberg with  sway.  IMPRESSION: 1.  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 2.  Raynaud's phenomenon - may be secondary to polycythemia vera vs other autoimmune disease 3.  History of cervical stenosis with myelopathy s/p ACDF.  I believe her numbness is related to  underlying peripheral neuropathy.  Current upper motor neuron signs such as hyperreflexia and Hoffman are residual chronic symptoms of prior myelopathy.    I do not suspect recurrence of cervical spinal stenosis as she has not had any progression of symptoms and actually believes her walking is improved. Also, Raynaud's would be more in line with underlying autoimmune condition.    PLAN: 1.  While her neuropathy is likely related to the above mentioned etiologies, we will check labs for other potential causes: ANA with reflex, sed rate, ACE, SPEP/IFE 2.  We will also order NCV-EMG to establish baseline and compare if we potentially need to repeat later due to worsening symptoms. 3.  Follow up after testing.  Thank you for allowing me to take part in the care of this patient.  Metta Clines, DO  CC: Maurice Small, MD

## 2020-02-05 ENCOUNTER — Other Ambulatory Visit: Payer: Self-pay

## 2020-02-05 ENCOUNTER — Encounter: Payer: Self-pay | Admitting: Neurology

## 2020-02-05 ENCOUNTER — Ambulatory Visit: Payer: Federal, State, Local not specified - PPO | Admitting: Neurology

## 2020-02-05 VITALS — BP 146/78 | HR 86 | Resp 18 | Ht 62.0 in | Wt 129.0 lb

## 2020-02-05 DIAGNOSIS — G6289 Other specified polyneuropathies: Secondary | ICD-10-CM

## 2020-02-05 DIAGNOSIS — I7300 Raynaud's syndrome without gangrene: Secondary | ICD-10-CM

## 2020-02-05 DIAGNOSIS — Z981 Arthrodesis status: Secondary | ICD-10-CM

## 2020-02-05 NOTE — Patient Instructions (Signed)
1.  We will check ANA with reflex, sed rate, ACE, SPEP/IFE 2.  We will check nerve conduction study of right upper and lower extremities 3.  Follow up after testing

## 2020-02-06 ENCOUNTER — Other Ambulatory Visit: Payer: Federal, State, Local not specified - PPO

## 2020-02-06 LAB — SEDIMENTATION RATE: Sed Rate: 2 mm/hr (ref 0–30)

## 2020-02-07 LAB — ANA W/REFLEX: Anti Nuclear Antibody (ANA): NEGATIVE

## 2020-02-09 LAB — ANGIOTENSIN CONVERTING ENZYME: Angiotensin-Converting Enzyme: 11 U/L (ref 9–67)

## 2020-02-10 LAB — PROTEIN ELECTROPHORESIS, SERUM
Albumin ELP: 4.4 g/dL (ref 3.8–4.8)
Alpha 1: 0.3 g/dL (ref 0.2–0.3)
Alpha 2: 0.8 g/dL (ref 0.5–0.9)
Beta 2: 0.2 g/dL (ref 0.2–0.5)
Beta Globulin: 0.4 g/dL (ref 0.4–0.6)
Gamma Globulin: 0.7 g/dL — ABNORMAL LOW (ref 0.8–1.7)
Total Protein: 6.8 g/dL (ref 6.1–8.1)

## 2020-02-10 LAB — IMMUNOFIXATION ELECTROPHORESIS
IgG (Immunoglobin G), Serum: 673 mg/dL (ref 600–1540)
IgM, Serum: 45 mg/dL — ABNORMAL LOW (ref 50–300)
Immunofix Electr Int: NOT DETECTED
Immunoglobulin A: 100 mg/dL (ref 70–320)

## 2020-02-23 IMAGING — DX CHEST - 2 VIEW
2 series · 2 of 2 positions shown · non-contrast
Comparison: Chest x-ray 05/02/2017.

CLINICAL DATA: Shortness of breath.  Cough.

EXAM:
CHEST - 2 VIEW

[dg chest 2 view (1 of 2)]
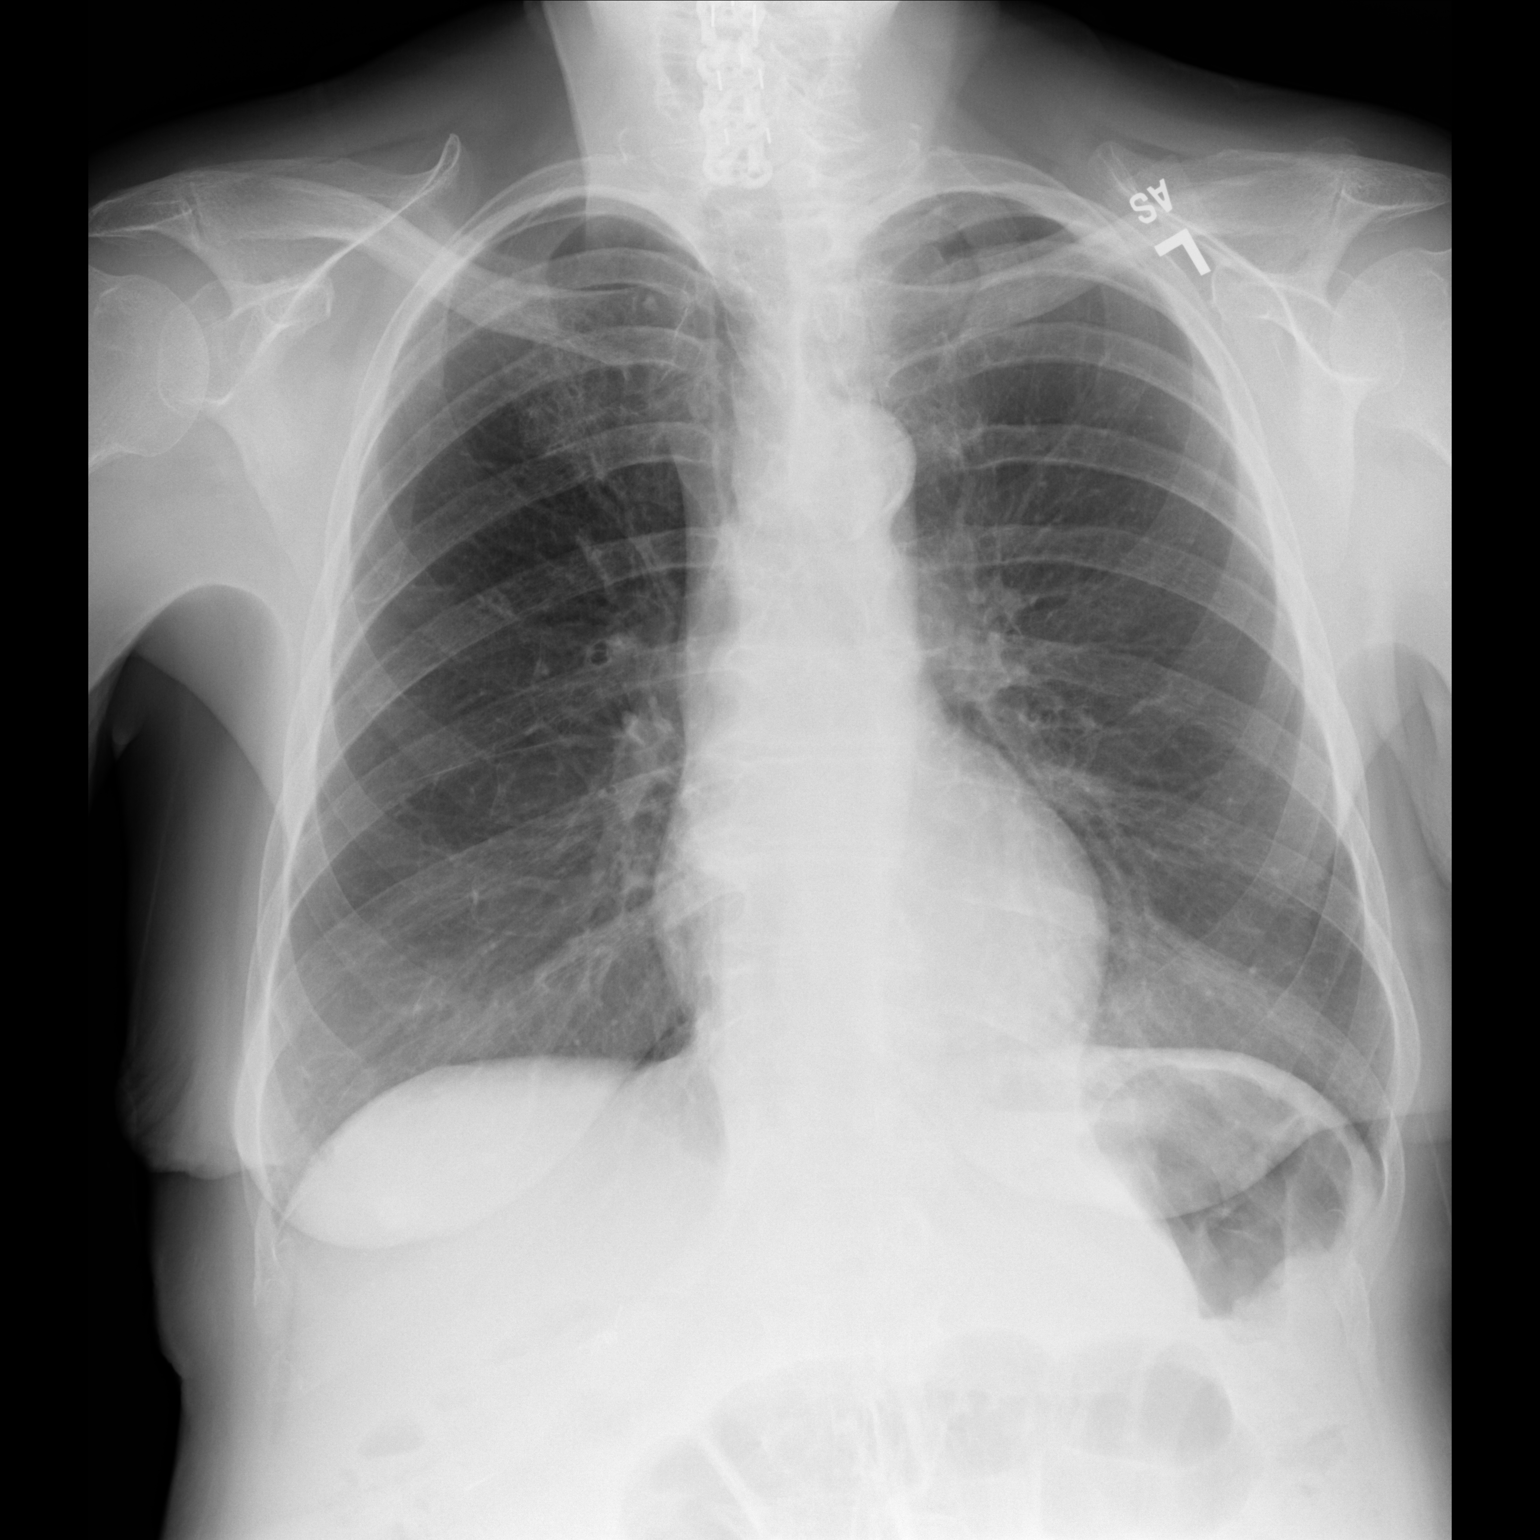

[dg chest 2 view (2 of 2)]
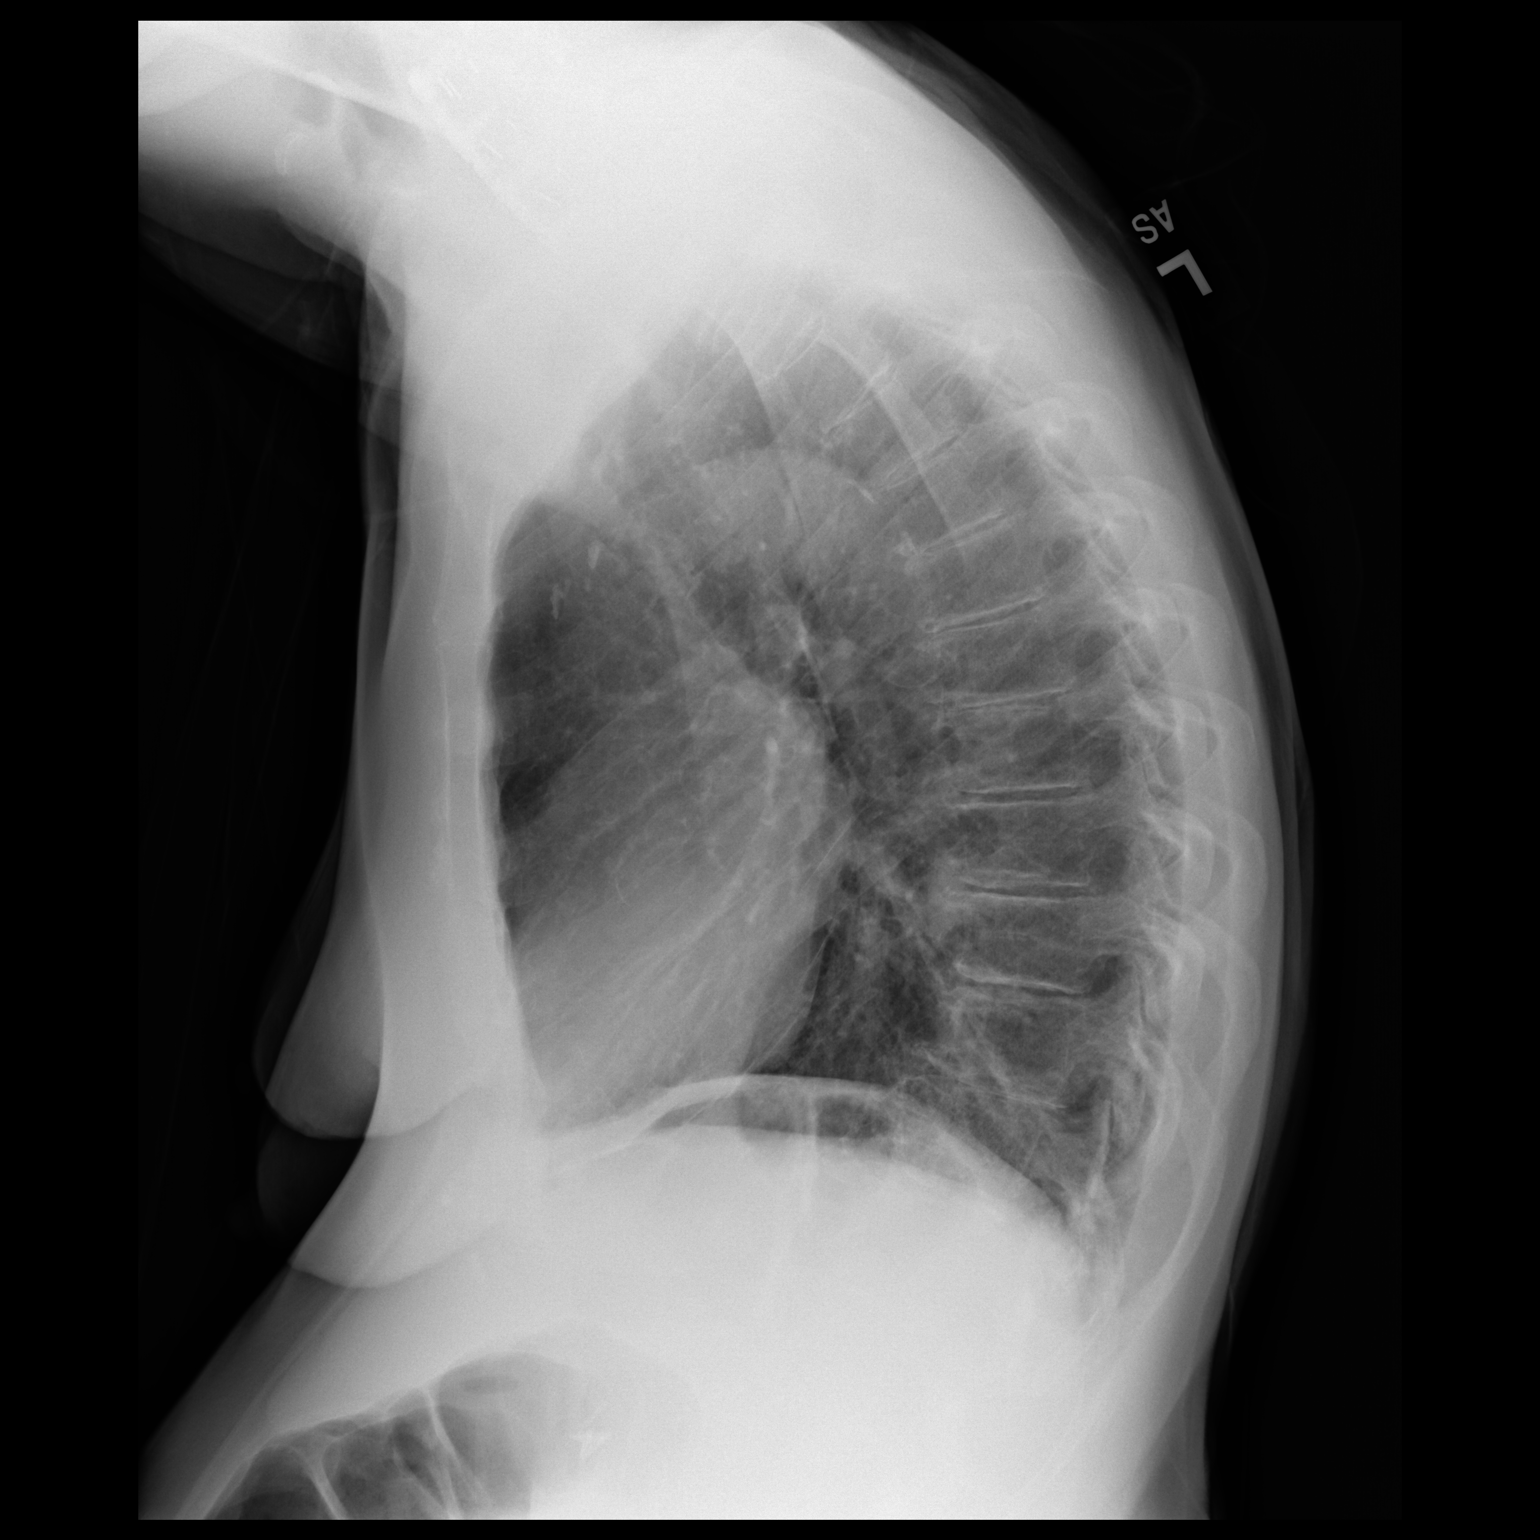

[2 of 2 positions shown; findings below may reference images not displayed]

FINDINGS: Mediastinum hilar structures normal. Heart size normal. Mild left
base infiltrate consistent pneumonia noted. No pleural effusion or
pneumothorax. No acute bony abnormality. Prior cervicothoracic spine
fusion.
IMPRESSION: Mild left base infiltrate consistent pneumonia.

## 2020-03-23 ENCOUNTER — Encounter: Payer: Federal, State, Local not specified - PPO | Admitting: Neurology

## 2020-04-14 ENCOUNTER — Other Ambulatory Visit: Payer: Self-pay

## 2020-04-14 ENCOUNTER — Ambulatory Visit: Payer: Federal, State, Local not specified - PPO | Admitting: Neurology

## 2020-04-14 DIAGNOSIS — G6289 Other specified polyneuropathies: Secondary | ICD-10-CM | POA: Diagnosis not present

## 2020-04-14 DIAGNOSIS — Z981 Arthrodesis status: Secondary | ICD-10-CM

## 2020-04-14 DIAGNOSIS — G5601 Carpal tunnel syndrome, right upper limb: Secondary | ICD-10-CM

## 2020-04-14 DIAGNOSIS — I7300 Raynaud's syndrome without gangrene: Secondary | ICD-10-CM

## 2020-04-14 NOTE — Procedures (Addendum)
[facility]  [address]  [place], [place] [address] Tel: [phone] Fax:  [phone] Test Date:  04/14/2020  Patient: Regina [name] DOB: [birth year] Physician: Narda Amber, DO  Sex: Female Height: 5\' 2"  Ref Phys: Metta Clines, D.O.  ID#: [id number]   Technician:    Patient Complaints: This is a 80 year old female with cervical myelopathy s/p surgery referred for evaluation of generalized numbness and tingling.  NCV & EMG Findings: Extensive electrodiagnostic testing of the right upper extremity and additional studies of the left shows:  1. Right median sensory response shows prolonged latency (3.9 ms).  Right ulnar, sural, and superficial peroneal sensory responses are within normal limits.   2. Right median, ulnar, peroneal, and tibial motor responses are within normal limits.   3. Right tibial H reflex study is within normal limits.   4. Chronic motor axonal loss changes are seen affecting the right biceps and deltoid muscles, without accompanied active denervation   Impression: 1. 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 2. Right median neuropathy at or distal to the wrist (mild), consistent with a clinical diagnosis of carpal tunnel syndrome.   3. In particular, there is no evidence of a sensorimotor polyneuropathy affecting the right arm or leg.   ___________________________ Narda Amber, DO    Nerve Conduction Studies Anti Sensory Summary Table   Stim Site NR Peak (ms) Norm Peak (ms) P-T Amp (V) Norm P-T Amp  Right Median Anti Sensory (2nd Digit)  32C  Wrist    3.9 <3.8 33.2 >10  Right Sup Peroneal Anti Sensory (Ant Lat Mall)  32C  12 cm    2.2 <4.6 6.6 >3  Right Sural Anti Sensory (Lat Mall)  32C  Calf    2.8 <4.6 19.2 >3  Right Ulnar Anti Sensory (5th Digit)  32C  Wrist    2.9 <3.2 35.0 >5   Motor Summary Table   Stim Site NR Onset (ms) Norm Onset (ms) O-P Amp (mV) Norm O-P Amp Site1 Site2  Delta-0 (ms) Dist (cm) Vel (m/s) Norm Vel (m/s)  Right Median Motor (Abd Poll Brev)  32C  Wrist    3.4 <4.0 7.5 >5 Elbow Wrist 5.1 28.0 55 >50  Elbow    8.5  6.9         Right Peroneal Motor (Ext Dig Brev)  32C  Ankle    2.7 <6.0 3.1 >2.5 B Fib Ankle 7.0 31.0 44 >40  B Fib    9.7  3.0  Poplt B Fib 1.7 8.0 47 >40  Poplt    11.4  3.0         Right Tibial Motor (Abd Hall Brev)  32C  Ankle    4.1 <6.0 4.5 >4 Knee Ankle 8.9 42.0 47 >40  Knee    13.0  2.9         Right Ulnar Motor (Abd Dig Minimi)  32C  Wrist    2.3 <3.1 8.2 >7 B Elbow Wrist 3.9 23.0 59 >50  B Elbow    6.2  7.9  A Elbow B Elbow 1.9 10.0 53 >50  A Elbow    8.1  7.5          H Reflex Studies   NR H-Lat (ms) Lat Norm (ms) L-R H-Lat (ms)  Right Tibial (Gastroc)  32C     31.29 <35    EMG   Side Muscle Ins Act Fibs Psw Fasc Number Recrt Dur Dur. Amp Amp. Poly  Poly. Comment  Right 1stDorInt Nml Nml Nml Nml Nml Nml Nml Nml Nml Nml Nml Nml N/A  Right Abd Poll Brev Nml Nml Nml Nml Nml Nml Nml Nml Nml Nml Nml Nml N/A  Right Biceps Nml Nml Nml Nml 1- Rapid Some 1+ Some 1+ Some 1+ N/A  Right Triceps Nml Nml Nml Nml Nml Nml Nml Nml Nml Nml Nml Nml N/A  Right Deltoid Nml Nml Nml Nml 1- Rapid Some 1+ Some 1+ Some 1+ N/A  Right PronatorTeres Nml Nml Nml Nml Nml Nml Nml Nml Nml Nml Nml Nml N/A  Right AntTibialis Nml Nml Nml Nml Nml Nml Nml Nml Nml Nml Nml Nml N/A  Right Gastroc Nml Nml Nml Nml Nml Nml Nml Nml Nml Nml Nml Nml N/A  Right Flex Dig Long Nml Nml Nml Nml Nml Nml Nml Nml Nml Nml Nml Nml N/A  Right RectFemoris Nml Nml Nml Nml Nml Nml Nml Nml Nml Nml Nml Nml N/A  Right GluteusMed Nml Nml Nml Nml Nml Nml Nml Nml Nml Nml Nml Nml N/A      Waveforms:

## 2020-04-16 NOTE — Progress Notes (Signed)
Advised pt of EMG results.

## 2020-10-06 ENCOUNTER — Other Ambulatory Visit: Payer: Self-pay | Admitting: Family Medicine

## 2020-10-06 DIAGNOSIS — M545 Low back pain, unspecified: Secondary | ICD-10-CM

## 2020-10-07 ENCOUNTER — Ambulatory Visit
Admission: RE | Admit: 2020-10-07 | Discharge: 2020-10-07 | Disposition: A | Discharge status: Home / Self Care | Payer: Federal, State, Local not specified - PPO | Source: Ambulatory Visit | Attending: Family Medicine | Admitting: Family Medicine

## 2020-10-07 DIAGNOSIS — M545 Low back pain, unspecified: Secondary | ICD-10-CM

## 2020-10-07 MED ORDER — GADOBENATE DIMEGLUMINE 529 MG/ML IV SOLN
12.0000 mL | Freq: Once | INTRAVENOUS | Status: AC | PRN
  Administered 2020-10-07: 12 mL via INTRAVENOUS

## 2022-04-12 ENCOUNTER — Ambulatory Visit
Admission: RE | Admit: 2022-04-12 | Discharge: 2022-04-12 | Disposition: A | Discharge status: Home / Self Care | Payer: Federal, State, Local not specified - PPO | Source: Ambulatory Visit | Attending: Family Medicine | Admitting: Family Medicine

## 2022-04-12 ENCOUNTER — Other Ambulatory Visit: Payer: Self-pay | Admitting: Family Medicine

## 2022-04-12 DIAGNOSIS — R0602 Shortness of breath: Secondary | ICD-10-CM

## 2022-08-21 ENCOUNTER — Encounter: Payer: Self-pay | Admitting: Family Medicine

## 2022-08-21 DIAGNOSIS — R1031 Right lower quadrant pain: Secondary | ICD-10-CM

## 2022-08-21 DIAGNOSIS — R140 Abdominal distension (gaseous): Secondary | ICD-10-CM

## 2022-08-22 ENCOUNTER — Other Ambulatory Visit: Payer: Self-pay | Admitting: Family Medicine

## 2022-08-22 DIAGNOSIS — R140 Abdominal distension (gaseous): Secondary | ICD-10-CM

## 2022-08-22 DIAGNOSIS — R1031 Right lower quadrant pain: Secondary | ICD-10-CM

## 2022-09-06 ENCOUNTER — Other Ambulatory Visit: Payer: Federal, State, Local not specified - PPO

## 2022-09-14 ENCOUNTER — Ambulatory Visit
Admission: RE | Admit: 2022-09-14 | Discharge: 2022-09-14 | Disposition: A | Discharge status: Home / Self Care | Payer: Federal, State, Local not specified - PPO | Source: Ambulatory Visit | Attending: Family Medicine | Admitting: Family Medicine

## 2022-09-14 DIAGNOSIS — R1031 Right lower quadrant pain: Secondary | ICD-10-CM

## 2022-09-14 DIAGNOSIS — R140 Abdominal distension (gaseous): Secondary | ICD-10-CM

## 2022-11-06 ENCOUNTER — Other Ambulatory Visit: Payer: Federal, State, Local not specified - PPO

## 2023-03-23 ENCOUNTER — Ambulatory Visit
Admission: RE | Admit: 2023-03-23 | Discharge: 2023-03-23 | Disposition: A | Discharge status: Home / Self Care | Payer: Federal, State, Local not specified - PPO | Source: Ambulatory Visit | Attending: Family Medicine | Admitting: Family Medicine

## 2023-03-23 ENCOUNTER — Other Ambulatory Visit: Payer: Self-pay | Admitting: Family Medicine

## 2023-03-23 DIAGNOSIS — M25551 Pain in right hip: Secondary | ICD-10-CM

## 2023-04-18 ENCOUNTER — Ambulatory Visit: Payer: Medicare Other | Attending: Family Medicine | Admitting: Physical Therapy

## 2023-04-18 ENCOUNTER — Other Ambulatory Visit: Payer: Self-pay

## 2023-04-18 ENCOUNTER — Encounter: Payer: Self-pay | Admitting: Physical Therapy

## 2023-04-18 DIAGNOSIS — M6281 Muscle weakness (generalized): Secondary | ICD-10-CM

## 2023-04-18 DIAGNOSIS — R262 Difficulty in walking, not elsewhere classified: Secondary | ICD-10-CM | POA: Insufficient documentation

## 2023-04-18 DIAGNOSIS — R252 Cramp and spasm: Secondary | ICD-10-CM | POA: Insufficient documentation

## 2023-04-18 DIAGNOSIS — R293 Abnormal posture: Secondary | ICD-10-CM | POA: Diagnosis present

## 2023-04-18 DIAGNOSIS — M25651 Stiffness of right hip, not elsewhere classified: Secondary | ICD-10-CM | POA: Diagnosis present

## 2023-04-18 DIAGNOSIS — M25551 Pain in right hip: Secondary | ICD-10-CM | POA: Diagnosis present

## 2023-04-18 NOTE — Therapy (Signed)
 OUTPATIENT PHYSICAL THERAPY LOWER EXTREMITY EVALUATION   Patient Name: Regina [name] MRN: [id number] DOB:[birth year], 83 y.o., female Today's Date: 04/18/2023  END OF SESSION:  PT End of Session - 04/18/23 1402     Visit Number 1    Date for PT Re-Evaluation 06/13/23    Authorization Type Medicare/BCBS    PT Start Time 1400    PT Stop Time 1443    PT Time Calculation (min) 43 min    Activity Tolerance Patient tolerated treatment well             Past Medical History:  Diagnosis Date   Anxiety    Arthritis    neck   Asthma    Cervical spondylolysis    Cervical spondylolysis    Depression    Hashimoto's disease    Insulin  resistance    PONV (postoperative nausea and vomiting)    hasn't had surgery since 1993   Past Surgical History:  Procedure Laterality Date   ANTERIOR CERVICAL DECOMP/DISCECTOMY FUSION N/A 12/06/2017   Procedure: ANTERIOR CERVICAL DECOMPRESSION/DISCECTOMY FUSION, CERVICAL 3- CERVICAL 4, CERVICAL 4- CERVICAL 5, CERVICAL 5- CERVICAL 6;  Surgeon: Augusto Blonder, MD;  Location: [facility] OR;  Service: Neurosurgery;  Laterality: N/A;  ANTERIOR CERVICAL DECOMPRESSION/DISCECTOMY FUSION, CERVICAL THREE- CERVICAL FOUR, CERVICAL FOUR- CERVICAL FIVE, CERVICAL FIVE- CERVICAL SIX   APPENDECTOMY     BREAST SURGERY     left biopsy   CESAREAN SECTION     x2   CHOLECYSTECTOMY     DENTAL SURGERY     teeth removal   OVARY SURGERY     ovarian wedge   TONSILLECTOMY     Patient Active Problem List   Diagnosis Date Noted   Chronic cough 03/10/2019   Leukocytosis    Hypokalemia    Labile blood pressure    Benign essential HTN    Postoperative pain    Hypertensive crisis    Diabetes 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)    Hypoalbuminemia due to protein-calorie malnutrition (HCC)    Acute blood loss anemia    Myelopathy (HCC) 12/10/2017   HNP (herniated nucleus pulposus) with myelopathy, cervical    Essential hypertension    Asthma    Insulin  resistance     Hashimoto's disease    Cervical spondylolysis    Anxiety    Hyponatremia 12/01/2017   Hypothyroidism 12/01/2017    PCP: Sylvester Evert MD  REFERRING PROVIDER: Marleta Simmer FNP  REFERRING DIAG: M25.551 acute pain of right hip  THERAPY DIAG:  Right hip pain Rationale for Evaluation and Treatment: Rehabilitation  ONSET DATE:  1 year, worsened Aug/Sept  SUBJECTIVE:   SUBJECTIVE STATEMENT: Patient presents with lateral right hip.  Been there a while, maybe a year, but seemed to worse after attending grandson's soccer game in August or September.  I never know when it's going to hit.  It goes out now on a daily basis and I can't even walk.  My husband has to come help me.  Uses a cane sometimes at home but not using today.    Feb 3 appt with Dr. 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 Yes NPRS scale: 3-4/10 Pain location: right lateral hip Pain orientation: Right  PAIN TYPE: sharp Pain  description: intermittent  Aggravating factors: walking, especially, hard chairs, lying on that side; getting up from low toilet, getting in/out of [employer] Relieving factors: get to a chair   PRECAUTIONS: None   WEIGHT BEARING RESTRICTIONS: No  FALLS:  Has patient fallen in last 6 months? 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 X-ray December 2024 DIAGNOSTIC FINDINGS: DG HIP (WITH OR WITHOUT PELVIS) 2-3V RIGHT   COMPARISON:  None available   FINDINGS: Mild-to-moderate joint space loss and spurring of the right hip joint. No fracture or dislocation. Soft tissues are unremarkable.   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    PATIENT SURVEYS:  FOTO 47%  COGNITION: Overall cognitive status: Within functional limits for tasks assessed      MUSCLE LENGTH: Mild limitation of bil HS and hip flexor lengths  POSTURE: increased thoracic kyphosis  PALPATION: Increased back pain with long axis joint distraction  LOWER EXTREMITY ROM: Pain with passive right hip internal rotation    Active ROM Right eval Left eval  Hip flexion 90 95  Hip extension 5 10  Hip abduction    Hip adduction    Hip internal rotation 20 30  Hip external rotation 35 40  Knee flexion    Knee extension    Ankle dorsiflexion    Ankle plantarflexion    Ankle inversion    Ankle eversion     (Blank rows = not tested)  LOWER EXTREMITY MMT: Pelvic drop with SLS 3 sec with pelvic drop right more so than left   MMT Right eval Left eval  Hip flexion 4 4  Hip extension 4 4  Hip abduction 4- back pain 4- Back pain  Hip adduction    Hip internal rotation    Hip external rotation 4- 4-  Knee flexion    Knee extension 4 4  Ankle dorsiflexion    Ankle plantarflexion    Ankle inversion    Ankle eversion     (Blank rows = not tested)  LOWER EXTREMITY SPECIAL TESTS:  Negative slump test Difficulty with SLR right and left but not painful  FUNCTIONAL TESTS:  5x STS no UE use:  24.72 sec TUG 22.11 sec mild unsteadiness no assistive device used  GAIT:  Comments: decreased gait speed                                                                                                                                TREATMENT DATE: 04/18/23  PATIENT EDUCATION:   Education details: Educated patient on anatomy and physiology of current symptoms, prognosis, plan of care as well as initial self care strategies to promote recovery; use of heat for pain control; cane in left hand to offset load on right hip Person educated: Patient Education method: Explanation Education comprehension: verbalized understanding  HOME EXERCISE PROGRAM: Discussed seated piriformis stretch  ASSESSMENT:  CLINICAL IMPRESSION: Patient is a 83 y.o. female who was seen today for physical therapy evaluation and treatment for right lateral hip pain. The patient would benefit from PT to address hip range of motion deficits, strength asymmetries and high pain levels that are currently affecting activities of daily living including using the lower toilet at home,  sitting in hard chairs, walking, and getting in/out of her SUV.    OBJECTIVE IMPAIRMENTS: decreased activity tolerance, decreased balance, decreased mobility, difficulty walking, decreased ROM, decreased strength, impaired perceived functional ability, and pain.   ACTIVITY LIMITATIONS: sitting, sleeping, toileting, and locomotion level  PARTICIPATION LIMITATIONS: meal prep, cleaning, laundry, driving, and community activity  PERSONAL FACTORS: Time since onset of injury/illness/exacerbation and 1-2 comorbidities: kidney disease (unable to take some OTC meds for pain control) diabetes  are also affecting patient's functional outcome.   REHAB POTENTIAL: Good  CLINICAL DECISION MAKING: Stable/uncomplicated  EVALUATION COMPLEXITY: Low   GOALS: Goals reviewed with patient? Yes  SHORT TERM GOALS: Target date: 05/16/2023   The patient will demonstrate knowledge of basic self care strategies and exercises to promote healing   Baseline: Goal status: INITIAL  2.  Improved LE strength and ease with sit to stand from lower toilet  with 5x STS test improved to 21 sec Baseline:  Goal status: INITIAL  3.  The patient will  have improved Timed Up and Go (TUG) time to   19    sec indicating improved gait speed and LE strength  Baseline:  Goal status: INITIAL  4.  The patient will report a 30% improvement in pain levels with functional activities which are currently difficult including walking, sitting in hard chairs and getting in and out of her [employer] Baseline:  Goal status: INITIAL    LONG TERM GOALS: Target date: 06/13/2023    The patient will be independent in a safe self progression of a home exercise program to promote further recovery of function  Baseline:  Goal status: INITIAL  2.  The patient will have improved right hip strength to at least 4+/5 needed for standing, walking longer distances and descending stairs at home and in the community  Baseline:  Goal status: INITIAL  3.  Improved bil LE strength needed to step on/off higher step for upcoming train travel Baseline:  Goal status: INITIAL  4.  The patient will have improved gait stamina and speed needed to ambulate 600 feet in 6 minutes  Baseline:  Goal status: INITIAL  5.  The patient will have improved FOTO score to    58%   indicating improved function with less pain  Baseline:  Goal status: INITIAL    PLAN:  PT FREQUENCY: 2x/week  PT DURATION: 8 weeks  PLANNED INTERVENTIONS: 97164- PT Re-evaluation, 97110-Therapeutic exercises, 97530- Therapeutic activity, [id number]- Neuromuscular re-education, 97535- Self Care, 16109- Manual therapy, [id number]- Aquatic Therapy, 97014- Electrical stimulation (unattended), [id number]- Electrical stimulation (manual), 97035- Ultrasound, Patient/Family education, Balance training, Stair training, Taping, Dry Needling, Joint mobilization, Cryotherapy, and Moist heat  PLAN FOR NEXT SESSION: right LE strengthening especially glutes; manual therapy: bent knee hip mobs; functional strength needed for  preparation for upcoming trip; get baseline 2 or 3 MWT  Darien Eden, PT 04/18/23 8:14 PM Phone:  [phone] Fax: [phone]

## 2023-04-24 ENCOUNTER — Ambulatory Visit: Payer: Medicare Other | Admitting: Physical Therapy

## 2023-04-26 ENCOUNTER — Ambulatory Visit: Payer: Medicare Other | Admitting: Physical Therapy

## 2023-04-26 DIAGNOSIS — M25651 Stiffness of right hip, not elsewhere classified: Secondary | ICD-10-CM

## 2023-04-26 DIAGNOSIS — M25551 Pain in right hip: Secondary | ICD-10-CM

## 2023-04-26 DIAGNOSIS — M6281 Muscle weakness (generalized): Secondary | ICD-10-CM

## 2023-04-26 NOTE — Therapy (Signed)
OUTPATIENT PHYSICAL THERAPY LOWER EXTREMITY PROGRESS NOTE   Patient Name: Regina [name] MRN: [id number] DOB:01-Oct-1940, 83 y.o., female Today's Date: 04/26/2023  END OF SESSION:  PT End of Session - 04/26/23 1450     Visit Number 2    Date for PT Re-Evaluation 06/13/23    Authorization Type Medicare/BCBS    PT Start Time 1451    PT Stop Time 1530    PT Time Calculation (min) 39 min    Activity Tolerance Patient tolerated treatment well             Past Medical History:  Diagnosis Date   Anxiety    Arthritis    neck   Asthma    Cervical spondylolysis    Cervical spondylolysis    Depression    Hashimoto's disease    Insulin resistance    PONV (postoperative nausea and vomiting)    hasn't had surgery since 1993   Past Surgical History:  Procedure Laterality Date   ANTERIOR CERVICAL DECOMP/DISCECTOMY FUSION N/A 12/06/2017   Procedure: ANTERIOR CERVICAL DECOMPRESSION/DISCECTOMY FUSION, CERVICAL 3- CERVICAL 4, CERVICAL 4- CERVICAL 5, CERVICAL 5- CERVICAL 6;  Surgeon: Lisbeth Renshaw, MD;  Location: [facility] OR;  Service: Neurosurgery;  Laterality: N/A;  ANTERIOR CERVICAL DECOMPRESSION/DISCECTOMY FUSION, CERVICAL THREE- CERVICAL FOUR, CERVICAL FOUR- CERVICAL FIVE, CERVICAL FIVE- CERVICAL SIX   APPENDECTOMY     BREAST SURGERY     left biopsy   CESAREAN SECTION     x2   CHOLECYSTECTOMY     DENTAL SURGERY     teeth removal   OVARY SURGERY     ovarian wedge   TONSILLECTOMY     Patient Active Problem List   Diagnosis Date Noted   Chronic cough 03/10/2019   Leukocytosis    Hypokalemia    Labile blood pressure    Benign essential HTN    Postoperative pain    Hypertensive crisis    Diabetes 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)    Hypoalbuminemia due to protein-calorie malnutrition (HCC)    Acute blood loss anemia    Myelopathy (HCC) 12/10/2017   HNP (herniated nucleus pulposus) with myelopathy, cervical    Essential hypertension    Asthma    Insulin  resistance    Hashimoto's disease    Cervical spondylolysis    Anxiety    Hyponatremia 12/01/2017   Hypothyroidism 12/01/2017    PCP: Shirlean Mylar MD  REFERRING PROVIDER: Jorge Ny FNP  REFERRING DIAG: M25.551 acute pain of right hip  THERAPY DIAG:  Right hip pain Rationale for Evaluation and Treatment: Rehabilitation  ONSET DATE:  1 year, worsened Aug/Sept  SUBJECTIVE:   SUBJECTIVE STATEMENT: It's been a bad week.  10/10 right lateral hip pain. I don't want to go through this anymore. The hip pain used to go away when I sit but not now.  Reaching for the toilet paper aggravates.  EVAL; Patient presents with lateral right hip.  Been there a while, maybe a year, but seemed to worse after attending grandson's soccer game in August or September.  I never know when it's going to hit.  It goes out now on a daily basis and I can't even walk.  My husband has to come help me.  Uses a cane sometimes at home but not using today.    Feb 3 appt with Dr. 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 Yes NPRS scale: 2/10 Pain location: right lateral hip Pain orientation: Right  PAIN TYPE: sharp Pain description: intermittent  Aggravating factors: walking, especially, hard chairs, lying on that side; getting up from low toilet, getting in/out of [employer] Relieving factors: get to a chair   PRECAUTIONS: None   WEIGHT BEARING RESTRICTIONS: No  FALLS:  Has patient fallen in last 6 months? 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 X-ray December 2024 DIAGNOSTIC FINDINGS: DG HIP (WITH OR WITHOUT PELVIS) 2-3V RIGHT   COMPARISON:  None available   FINDINGS: Mild-to-moderate joint space loss and spurring of the right hip joint. No fracture or dislocation. Soft tissues are unremarkable.   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    PATIENT SURVEYS:  FOTO 47%  COGNITION: Overall cognitive status: Within functional limits for tasks assessed      MUSCLE LENGTH: Mild limitation of bil HS and hip flexor lengths  POSTURE: increased thoracic kyphosis  PALPATION: Increased back pain with long axis joint distraction  LOWER EXTREMITY ROM: Pain with passive right hip internal rotation    Active ROM Right eval Left eval  Hip flexion 90 95  Hip extension 5 10  Hip abduction    Hip adduction    Hip internal rotation 20 30  Hip external rotation 35 40  Knee flexion    Knee extension    Ankle dorsiflexion    Ankle plantarflexion    Ankle inversion    Ankle eversion     (Blank rows = not tested)  LOWER EXTREMITY MMT: Pelvic drop with SLS 3 sec with pelvic drop right more so than left   MMT Right eval Left eval  Hip flexion 4 4  Hip extension 4 4  Hip abduction 4- back pain 4- Back pain  Hip adduction    Hip internal rotation    Hip external rotation 4- 4-  Knee flexion    Knee extension 4 4  Ankle dorsiflexion    Ankle plantarflexion    Ankle inversion    Ankle eversion     (Blank rows = not tested)  LOWER EXTREMITY SPECIAL TESTS:  Negative slump test Difficulty with SLR right and left but not painful  FUNCTIONAL TESTS:  5x STS no UE use:  24.72 sec TUG 22.11 sec mild unsteadiness no assistive device  used  GAIT:  Comments: decreased gait speed  TREATMENT DATE:  04/26/23  Nu-Step L1 seat 4, arms 6: 10 min while discussing status Standing: lateral sliders, circles each way, holding 3# ipsilateral hip flexion 7x each right/left Seated green band clams 5 sec holds 10x Seated LAQ 3# 10x Seated HS curls with slider green band 10x Seated up and over the weight on the floor 10x Sit to stand 7x    PATIENT EDUCATION:  Education details: Educated patient on anatomy and physiology of current symptoms, prognosis, plan of care as well as initial self care strategies to promote recovery; use of heat for pain control; cane in left hand to offset load on right hip Person educated: Patient Education method: Explanation Education comprehension: verbalized understanding  HOME EXERCISE PROGRAM: Discussed seated piriformis stretch  ASSESSMENT:  CLINICAL IMPRESSION: Regina [name] reports severe hip pain over the week/weekend but arrives with low pain levels.  She responds well to low intensity exercise today.  Her pain level remains low throughout the session.  Therapist providing verbal cues to optimize technique and monitoring response.    OBJECTIVE IMPAIRMENTS: decreased activity tolerance, decreased balance, decreased mobility, difficulty walking, decreased ROM, decreased strength, impaired perceived functional ability, and pain.   ACTIVITY LIMITATIONS: sitting, sleeping, toileting, and locomotion level  PARTICIPATION LIMITATIONS: meal prep, cleaning, laundry, driving, and community activity  PERSONAL FACTORS: Time since onset of injury/illness/exacerbation and 1-2 comorbidities: kidney disease (unable to take some OTC meds for pain control) diabetes  are also affecting patient's functional outcome.   REHAB POTENTIAL: Good  CLINICAL DECISION MAKING:  Stable/uncomplicated  EVALUATION COMPLEXITY: Low   GOALS: Goals reviewed with patient? Yes  SHORT TERM GOALS: Target date: 05/16/2023   The patient will demonstrate knowledge of basic self care strategies and exercises to promote healing   Baseline: Goal status: INITIAL  2.  Improved LE strength and ease with sit to stand from lower toilet  with 5x STS test improved to 21 sec Baseline:  Goal status: INITIAL  3.  The patient will have improved Timed Up and Go (TUG) time to   19    sec indicating improved gait speed and LE strength  Baseline:  Goal status: INITIAL  4.  The patient will report a 30% improvement in pain levels with functional activities which are currently difficult including walking, sitting in hard chairs and getting in and out of her [employer] Baseline:  Goal status: INITIAL    LONG TERM GOALS: Target date: 06/13/2023    The patient will be independent in a safe self progression of a home exercise program to promote further recovery of function  Baseline:  Goal status: INITIAL  2.  The patient will have improved right hip strength to at least 4+/5 needed for standing, walking longer distances and descending stairs at home and in the community  Baseline:  Goal status: INITIAL  3.  Improved bil LE strength needed to step on/off higher step for upcoming train travel Baseline:  Goal status: INITIAL  4.  The patient will have improved gait stamina and speed needed to ambulate 600 feet in 6 minutes  Baseline:  Goal status: INITIAL  5.  The patient will have improved FOTO score to    58%   indicating improved function with less pain  Baseline:  Goal status: INITIAL    PLAN:  PT FREQUENCY: 2x/week  PT DURATION: 8 weeks  PLANNED INTERVENTIONS: 97164- PT Re-evaluation, 97110-Therapeutic exercises, 97530- Therapeutic activity, [id number]- Neuromuscular re-education, 97535- Self Care, 57846- Manual therapy, [id number]- Aquatic Therapy, 97014- Electrical  stimulation (unattended), [phone]- Electrical stimulation (manual), 40347- Ultrasound, Patient/Family education, Balance training, Stair training, Taping, Dry Needling, Joint mobilization, Cryotherapy, and Moist heat  PLAN FOR NEXT SESSION: start HEP based on response from ex today;  right LE strengthening especially glutes; manual therapy: bent knee hip mobs; functional strength needed for preparation for upcoming trip to [place] in Feb  Lavinia Sharps, PT 04/26/23 5:14 PM Phone: [phone] Fax: [phone]

## 2023-05-01 ENCOUNTER — Ambulatory Visit: Payer: Medicare Other

## 2023-05-01 DIAGNOSIS — R293 Abnormal posture: Secondary | ICD-10-CM

## 2023-05-01 DIAGNOSIS — M25651 Stiffness of right hip, not elsewhere classified: Secondary | ICD-10-CM

## 2023-05-01 DIAGNOSIS — M25551 Pain in right hip: Secondary | ICD-10-CM

## 2023-05-01 DIAGNOSIS — M6281 Muscle weakness (generalized): Secondary | ICD-10-CM

## 2023-05-01 DIAGNOSIS — R252 Cramp and spasm: Secondary | ICD-10-CM

## 2023-05-01 DIAGNOSIS — R262 Difficulty in walking, not elsewhere classified: Secondary | ICD-10-CM

## 2023-05-01 NOTE — Therapy (Signed)
OUTPATIENT PHYSICAL THERAPY LOWER EXTREMITY TREATMENT   Patient Name: Regina [name] MRN: [id number] DOB:1940-09-04, 83 y.o., female Today's Date: 05/01/2023  END OF SESSION:  PT End of Session - 05/01/23 1406     Visit Number 3    Date for PT Re-Evaluation 06/13/23    Authorization Type Medicare/BCBS    PT Start Time 1400    PT Stop Time 1445    PT Time Calculation (min) 45 min    Activity Tolerance Patient tolerated treatment well    Behavior During Therapy WFL for tasks assessed/performed             Past Medical History:  Diagnosis Date   Anxiety    Arthritis    neck   Asthma    Cervical spondylolysis    Cervical spondylolysis    Depression    Hashimoto's disease    Insulin resistance    PONV (postoperative nausea and vomiting)    hasn't had surgery since 1993   Past Surgical History:  Procedure Laterality Date   ANTERIOR CERVICAL DECOMP/DISCECTOMY FUSION N/A 12/06/2017   Procedure: ANTERIOR CERVICAL DECOMPRESSION/DISCECTOMY FUSION, CERVICAL 3- CERVICAL 4, CERVICAL 4- CERVICAL 5, CERVICAL 5- CERVICAL 6;  Surgeon: Lisbeth Renshaw, MD;  Location: [facility] OR;  Service: Neurosurgery;  Laterality: N/A;  ANTERIOR CERVICAL DECOMPRESSION/DISCECTOMY FUSION, CERVICAL THREE- CERVICAL FOUR, CERVICAL FOUR- CERVICAL FIVE, CERVICAL FIVE- CERVICAL SIX   APPENDECTOMY     BREAST SURGERY     left biopsy   CESAREAN SECTION     x2   CHOLECYSTECTOMY     DENTAL SURGERY     teeth removal   OVARY SURGERY     ovarian wedge   TONSILLECTOMY     Patient Active Problem List   Diagnosis Date Noted   Chronic cough 03/10/2019   Leukocytosis    Hypokalemia    Labile blood pressure    Benign essential HTN    Postoperative pain    Hypertensive crisis    Diabetes 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)    Hypoalbuminemia due to protein-calorie malnutrition (HCC)    Acute blood loss anemia    Myelopathy (HCC) 12/10/2017   HNP (herniated nucleus pulposus) with myelopathy, cervical     Essential hypertension    Asthma    Insulin resistance    Hashimoto's disease    Cervical spondylolysis    Anxiety    Hyponatremia 12/01/2017   Hypothyroidism 12/01/2017    PCP: Shirlean Mylar MD  REFERRING PROVIDER: Jorge Ny FNP  REFERRING DIAG: W09.811 acute pain of right hip  THERAPY DIAG:  Right hip pain Rationale for Evaluation and Treatment: Rehabilitation  ONSET DATE:  1 year, worsened Aug/Sept  SUBJECTIVE:   SUBJECTIVE STATEMENT: Patient reports she continued to have pain for several days after last session but then the pain diminished on Saturday afternoon and has been pretty good ever since.  She rates her pain at 1/10.    EVAL; Patient presents with lateral right hip.  Been there a while, maybe a year, but seemed to worse after attending grandson's soccer game in August or September.  I never know when it's going to hit.  It goes out now on a daily basis and I can't even walk.  My husband has to come help me.  Uses a cane sometimes at home but not using today.    Feb 3 appt with Dr. 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 Yes NPRS scale: 2/10 Pain location: right lateral hip Pain orientation: Right  PAIN TYPE: sharp Pain description: intermittent  Aggravating factors: walking, especially, hard chairs, lying on that side; getting up from low toilet, getting in/out of [employer] Relieving factors: get to a chair   PRECAUTIONS: None   WEIGHT BEARING RESTRICTIONS: No  FALLS:  Has patient fallen in last 6 months? 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 X-ray December 2024 DIAGNOSTIC FINDINGS: DG HIP (WITH OR WITHOUT PELVIS) 2-3V RIGHT   COMPARISON:  None available   FINDINGS: Mild-to-moderate joint space loss and spurring of the right hip joint. No fracture or dislocation. Soft tissues are unremarkable.   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    PATIENT SURVEYS:  FOTO 47%  COGNITION: Overall cognitive status: Within functional limits for tasks assessed      MUSCLE LENGTH: Mild limitation of bil HS and hip flexor lengths  POSTURE: increased thoracic kyphosis  PALPATION: Increased back pain with long axis joint distraction  LOWER EXTREMITY ROM: Pain with passive right hip internal rotation    Active ROM Right eval Left eval  Hip flexion 90 95  Hip extension 5 10  Hip abduction    Hip adduction    Hip internal rotation 20 30  Hip external rotation 35 40  Knee flexion    Knee extension    Ankle dorsiflexion    Ankle plantarflexion    Ankle inversion    Ankle eversion     (Blank rows = not tested)  LOWER EXTREMITY MMT: Pelvic drop with SLS 3 sec with pelvic drop right more so than left   MMT Right eval Left eval  Hip flexion 4 4  Hip extension 4 4  Hip abduction 4- back pain 4- Back pain  Hip adduction    Hip internal rotation    Hip external rotation 4- 4-  Knee flexion    Knee extension 4 4  Ankle dorsiflexion    Ankle plantarflexion    Ankle inversion    Ankle eversion     (Blank rows = not tested)  LOWER EXTREMITY SPECIAL TESTS:  Negative slump test Difficulty with SLR right and left but not painful  FUNCTIONAL TESTS:  5x STS no UE use:  24.72 sec TUG 22.11  sec mild unsteadiness no assistive device used  GAIT:  Comments: decreased gait speed  TREATMENT DATE:  05/01/23  Nu-Step L1 seat 4, arms 6: 10 min while discussing status Seated blue loop clams 5 sec holds 10x Seated LAQ 3# 10x Seated up and over the weight on the floor 10x Standing hip extension sliders with 3 lb ankle weights Standing lateral sliders with 3 lb ankle weights Sit to stand x 8  04/26/23  Nu-Step L1 seat 4, arms 6: 10 min while discussing status Standing: lateral sliders, circles each way, holding 3# ipsilateral hip flexion 7x each right/left Seated green band clams 5 sec holds 10x Seated LAQ 3# 10x Seated HS curls with slider green band 10x Seated up and over the weight on the floor 10x Sit to stand 7x    PATIENT EDUCATION:  Education details: Educated patient on anatomy and physiology of current symptoms, prognosis, plan of care as well as initial self care strategies to promote recovery; use of heat for pain control; cane in left hand to offset load on right hip Person educated: Patient Education method: Explanation Education comprehension: verbalized understanding  HOME EXERCISE PROGRAM: Discussed seated piriformis stretch  ASSESSMENT:  CLINICAL IMPRESSION: Regina [name]'s severe pain symptoms have diminished.  She has rated her pain a 1/10 today.  She was able to complete all tasks.  She responds well to low intensity exercise today.  Her pain level remains low throughout the session.  Therapist providing verbal cues to optimize technique and monitoring response.    OBJECTIVE IMPAIRMENTS: decreased activity tolerance, decreased balance, decreased mobility, difficulty walking, decreased ROM, decreased strength, impaired perceived functional ability, and pain.   ACTIVITY LIMITATIONS: sitting, sleeping, toileting, and locomotion  level  PARTICIPATION LIMITATIONS: meal prep, cleaning, laundry, driving, and community activity  PERSONAL FACTORS: Time since onset of injury/illness/exacerbation and 1-2 comorbidities: kidney disease (unable to take some OTC meds for pain control) diabetes  are also affecting patient's functional outcome.   REHAB POTENTIAL: Good  CLINICAL DECISION MAKING: Stable/uncomplicated  EVALUATION COMPLEXITY: Low   GOALS: Goals reviewed with patient? Yes  SHORT TERM GOALS: Target date: 05/16/2023   The patient will demonstrate knowledge of basic self care strategies and exercises to promote healing   Baseline: Goal status: INITIAL  2.  Improved LE strength and ease with sit to stand from lower toilet  with 5x STS test improved to 21 sec Baseline:  Goal status: INITIAL  3.  The patient will have improved Timed Up and Go (TUG) time to   19    sec indicating improved gait speed and LE strength  Baseline:  Goal status: INITIAL  4.  The patient will report a 30% improvement in pain levels with functional activities which are currently difficult including walking, sitting in hard chairs and getting in and out of her [employer] Baseline:  Goal status: INITIAL    LONG TERM GOALS: Target date: 06/13/2023    The patient will be independent in a safe self progression of a home exercise program to promote further recovery of function  Baseline:  Goal status: INITIAL  2.  The patient will have improved right hip strength to at least 4+/5 needed for standing, walking longer distances and descending stairs at home and in the community  Baseline:  Goal status: INITIAL  3.  Improved bil LE strength needed to step on/off higher step for upcoming train travel Baseline:  Goal status: INITIAL  4.  The patient will have improved gait stamina and speed needed to ambulate 600 feet in 6 minutes  Baseline:  Goal status: INITIAL  5.  The patient will have improved FOTO score to    58%    indicating improved function with less pain  Baseline:  Goal status: INITIAL    PLAN:  PT FREQUENCY: 2x/week  PT DURATION: 8 weeks  PLANNED INTERVENTIONS: 97164- PT Re-evaluation, 97110-Therapeutic exercises, 97530- Therapeutic activity, 97112- Neuromuscular re-education, 97535- Self Care, [id number]- Manual therapy, [id number]- Aquatic Therapy, 97014- Electrical stimulation (unattended), [id number]- Electrical stimulation (manual), 97035- Ultrasound, Patient/Family education, Balance training, Stair training, Taping, Dry Needling, Joint mobilization, Cryotherapy, and Moist heat  PLAN FOR NEXT SESSION: start HEP based on response from ex today;  right LE strengthening especially glutes; manual therapy: bent knee hip mobs; functional strength needed for preparation for upcoming trip to [place] in Feb  Dalisa Forrer B. Aadarsh Cozort, PT 05/01/23 7:42 PM [facility] [address], [address] [place], [place] [address] Phone # [phone] Fax [phone]

## 2023-05-08 ENCOUNTER — Ambulatory Visit: Payer: Medicare Other | Attending: Family Medicine

## 2023-05-08 DIAGNOSIS — R252 Cramp and spasm: Secondary | ICD-10-CM | POA: Diagnosis present

## 2023-05-08 DIAGNOSIS — R293 Abnormal posture: Secondary | ICD-10-CM | POA: Diagnosis present

## 2023-05-08 DIAGNOSIS — M6281 Muscle weakness (generalized): Secondary | ICD-10-CM | POA: Diagnosis present

## 2023-05-08 DIAGNOSIS — R262 Difficulty in walking, not elsewhere classified: Secondary | ICD-10-CM | POA: Diagnosis present

## 2023-05-08 DIAGNOSIS — M25651 Stiffness of right hip, not elsewhere classified: Secondary | ICD-10-CM | POA: Insufficient documentation

## 2023-05-08 DIAGNOSIS — M25551 Pain in right hip: Secondary | ICD-10-CM | POA: Insufficient documentation

## 2023-05-08 NOTE — Therapy (Signed)
 OUTPATIENT PHYSICAL THERAPY LOWER EXTREMITY TREATMENT   Patient Name: Regina [name] MRN: [id number] DOB:1940-10-11, 83 y.o., female Today's Date: 05/08/2023  END OF SESSION:  PT End of Session - 05/08/23 1414     Visit Number 4    Date for PT Re-Evaluation 06/13/23    Authorization Type Medicare/BCBS    PT Start Time 1408    PT Stop Time 1446    PT Time Calculation (min) 38 min    Activity Tolerance Patient tolerated treatment well    Behavior During Therapy WFL for tasks assessed/performed             Past Medical History:  Diagnosis Date   Anxiety    Arthritis    neck   Asthma    Cervical spondylolysis    Cervical spondylolysis    Depression    Hashimoto's disease    Insulin  resistance    PONV (postoperative nausea and vomiting)    hasn't had surgery since 1993   Past Surgical History:  Procedure Laterality Date   ANTERIOR CERVICAL DECOMP/DISCECTOMY FUSION N/A 12/06/2017   Procedure: ANTERIOR CERVICAL DECOMPRESSION/DISCECTOMY FUSION, CERVICAL 3- CERVICAL 4, CERVICAL 4- CERVICAL 5, CERVICAL 5- CERVICAL 6;  Surgeon: Lanis Pupa, MD;  Location: [facility] OR;  Service: Neurosurgery;  Laterality: N/A;  ANTERIOR CERVICAL DECOMPRESSION/DISCECTOMY FUSION, CERVICAL THREE- CERVICAL FOUR, CERVICAL FOUR- CERVICAL FIVE, CERVICAL FIVE- CERVICAL SIX   APPENDECTOMY     BREAST SURGERY     left biopsy   CESAREAN SECTION     x2   CHOLECYSTECTOMY     DENTAL SURGERY     teeth removal   OVARY SURGERY     ovarian wedge   TONSILLECTOMY     Patient Active Problem List   Diagnosis Date Noted   Chronic cough 03/10/2019   Leukocytosis    Hypokalemia    Labile blood pressure    Benign essential HTN    Postoperative pain    Hypertensive crisis    Diabetes 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)    Hypoalbuminemia due to protein-calorie malnutrition (HCC)    Acute blood loss anemia    Myelopathy (HCC) 12/10/2017   HNP (herniated nucleus pulposus) with myelopathy, cervical     Essential hypertension    Asthma    Insulin  resistance    Hashimoto's disease    Cervical spondylolysis    Anxiety    Hyponatremia 12/01/2017   Hypothyroidism 12/01/2017    PCP: Douglass Ivanoff MD  REFERRING PROVIDER: Dyane Dries FNP  REFERRING DIAG: M25.551 acute pain of right hip  THERAPY DIAG:  Right hip pain Rationale for Evaluation and Treatment: Rehabilitation  ONSET DATE:  1 year, worsened Aug/Sept  SUBJECTIVE:   SUBJECTIVE STATEMENT: Patient reports she did really good after last visit and didn't start hurting until yesterday and that pain was quite mild.   EVAL; Patient presents with lateral right hip.  Been there a while, maybe a year, but seemed to worse after attending grandson's soccer game in August or September.  I never know when it's going to hit.  It goes out now on a daily basis and I can't even walk.  My husband has to come help me.  Uses a cane sometimes at home but not using today.    Feb 3 appt with Dr. 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 Yes NPRS scale: 2/10 Pain location: right lateral hip Pain orientation: Right  PAIN TYPE: sharp Pain description: intermittent  Aggravating factors: walking, especially, hard chairs, lying on that side; getting up from low toilet, getting in/out of [employer] Relieving factors: get to a chair   PRECAUTIONS: None   WEIGHT BEARING RESTRICTIONS: No  FALLS:  Has patient fallen in last 6 months? 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 X-ray December 2024 DIAGNOSTIC FINDINGS: DG HIP (WITH OR WITHOUT PELVIS) 2-3V RIGHT   COMPARISON:  None available   FINDINGS: Mild-to-moderate joint space loss and spurring of the right hip joint. No fracture or dislocation. Soft tissues are unremarkable.   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    PATIENT SURVEYS:  FOTO 47%  COGNITION: Overall cognitive status: Within functional limits for tasks assessed      MUSCLE LENGTH: Mild limitation of bil HS and hip flexor lengths  POSTURE: increased thoracic kyphosis  PALPATION: Increased back pain with long axis joint distraction  LOWER EXTREMITY ROM: Pain with passive right hip internal rotation    Active ROM Right eval Left eval  Hip flexion 90 95  Hip extension 5 10  Hip abduction    Hip adduction    Hip internal rotation 20 30  Hip external rotation 35 40  Knee flexion    Knee extension    Ankle dorsiflexion    Ankle plantarflexion    Ankle inversion    Ankle eversion     (Blank rows = not tested)  LOWER EXTREMITY MMT: Pelvic drop with SLS 3 sec with pelvic drop right more so than left   MMT Right eval Left eval  Hip flexion 4 4  Hip extension 4 4  Hip abduction 4- back pain 4- Back pain  Hip adduction    Hip internal rotation    Hip external rotation 4- 4-  Knee flexion    Knee extension 4 4  Ankle dorsiflexion    Ankle plantarflexion    Ankle inversion    Ankle eversion     (Blank rows = not tested)  LOWER EXTREMITY SPECIAL TESTS:  Negative slump test Difficulty with SLR right and left but not painful  FUNCTIONAL TESTS:  5x STS no UE use:  24.72 sec TUG 22.11 sec mild unsteadiness no assistive device used  GAIT:  Comments: decreased  gait speed  TREATMENT DATE:  05/08/23  Nu-Step L1 seat 4, arms 6: 10 min while discussing status Seated blue loop clams 5 sec holds 10x Seated LAQ 3# 2 x 10 Seated March x 20 with 3# Seated up and over the weight on the floor 2 x 10 each LE with 3 # Standing hip extension sliders with 3 lb ankle weights 2 x 10 each LE Standing lateral sliders with 3 lb ankle weights 2 x 10 each LE Sit to stand x 8  05/01/23  Nu-Step L1 seat 4, arms 6: 10 min while discussing status Seated blue loop clams 5 sec holds 10x Seated LAQ 3# 10x Seated up and over the weight on the floor 10x Standing hip extension sliders with 3 lb ankle weights Standing lateral sliders with 3 lb ankle weights Sit to stand x 8  04/26/23  Nu-Step L1 seat 4, arms 6: 10 min while discussing status Standing: lateral sliders, circles each way, holding 3# ipsilateral hip flexion 7x each right/left Seated green band clams 5 sec holds 10x Seated LAQ 3# 10x Seated HS curls with slider green band 10x Seated up and over the weight on the floor 10x Sit to stand 7x    PATIENT EDUCATION:  Education details: Educated patient on anatomy and physiology of current symptoms, prognosis, plan of care as well as initial self care strategies to promote recovery; use of heat for pain control; cane in left hand to offset load on right hip Person educated: Patient Education method: Explanation Education comprehension: verbalized understanding  HOME EXERCISE PROGRAM: Access Code: [email or web address] URL: [email or web address] Date: 05/08/2023 Prepared by: Delon Haddock  Exercises - Supine ITB Stretch with Strap  - 1 x daily - 7 x weekly - 1 sets - 3 reps - 30 sec hold - Standing Hip Extension with Counter Support  - 1 x daily - 7 x weekly - 2 sets - 10 reps - Standing 4-Way Leg Reach with Counter Support  - 1  x daily - 7 x weekly - 2 sets - 10 reps - Sit to Stand  - 1 x daily - 7 x weekly - 1 sets - 10 reps - Seated Long Arc Quad  - 1 x daily - 7 x weekly - 2 sets - 10 reps - Seated March  - 1 x daily - 7 x weekly - 2 sets - 10 reps  ASSESSMENT:  CLINICAL IMPRESSION: Regina [name] continues to progress well with current treatment plan.  She reports very little pain.  We initiated her HEP.   Therapist providing verbal cues to optimize technique and monitoring response.  She would benefit from continued skilled PT for LE strengthening, proprioception and balance training.    OBJECTIVE IMPAIRMENTS: decreased activity tolerance, decreased balance, decreased mobility, difficulty walking, decreased ROM, decreased strength, impaired perceived functional ability, and pain.   ACTIVITY LIMITATIONS: sitting, sleeping, toileting, and locomotion level  PARTICIPATION LIMITATIONS: meal prep, cleaning, laundry, driving, and community activity  PERSONAL FACTORS: Time since onset of injury/illness/exacerbation and 1-2 comorbidities: kidney disease (unable to take some OTC meds for pain control) diabetes  are also affecting patient's functional outcome.   REHAB POTENTIAL: Good  CLINICAL DECISION MAKING: Stable/uncomplicated  EVALUATION COMPLEXITY: Low   GOALS: Goals reviewed with patient? Yes  SHORT TERM GOALS: Target date: 05/16/2023   The patient will demonstrate knowledge of basic self care strategies and exercises to promote healing   Baseline: Goal status: MET 05/08/23  2.  Improved LE strength and ease with sit to stand from  lower toilet  with 5x STS test improved to 21 sec Baseline:  Goal status: In progress but likely met  3.  The patient will have improved Timed Up and Go (TUG) time to   19    sec indicating improved gait speed and LE strength  Baseline:  Goal status: INITIAL  4.  The patient will report a 30% improvement in pain levels with functional activities which are currently difficult  including walking, sitting in hard chairs and getting in and out of her [employer] Baseline:  Goal status: MET 05/08/23    LONG TERM GOALS: Target date: 06/13/2023    The patient will be independent in a safe self progression of a home exercise program to promote further recovery of function  Baseline:  Goal status: INITIAL  2.  The patient will have improved right hip strength to at least 4+/5 needed for standing, walking longer distances and descending stairs at home and in the community  Baseline:  Goal status: INITIAL  3.  Improved bil LE strength needed to step on/off higher step for upcoming train travel Baseline:  Goal status: INITIAL  4.  The patient will have improved gait stamina and speed needed to ambulate 600 feet in 6 minutes  Baseline:  Goal status: INITIAL  5.  The patient will have improved FOTO score to    58%   indicating improved function with less pain  Baseline:  Goal status: INITIAL    PLAN:  PT FREQUENCY: 2x/week  PT DURATION: 8 weeks  PLANNED INTERVENTIONS: 97164- PT Re-evaluation, 97110-Therapeutic exercises, 97530- Therapeutic activity, 97112- Neuromuscular re-education, 97535- Self Care, [id number]- Manual therapy, [id number]- Aquatic Therapy, 97014- Electrical stimulation (unattended), [id number]- Electrical stimulation (manual), 97035- Ultrasound, Patient/Family education, Balance training, Stair training, Taping, Dry Needling, Joint mobilization, Cryotherapy, and Moist heat  PLAN FOR NEXT SESSION:  Nustep,  right LE strengthening especially glutes; manual therapy: bent knee hip mobs; functional strength needed for preparation for upcoming trip to [place]  in Feb  Regina [name], PT 05/08/23 2:47 PM [facility] [address] [place], [place] [address] Phone # [phone] Fax [phone]

## 2023-05-10 ENCOUNTER — Ambulatory Visit: Payer: Medicare Other | Admitting: Physical Therapy

## 2023-05-10 DIAGNOSIS — M25651 Stiffness of right hip, not elsewhere classified: Secondary | ICD-10-CM

## 2023-05-10 DIAGNOSIS — M6281 Muscle weakness (generalized): Secondary | ICD-10-CM

## 2023-05-10 DIAGNOSIS — M25551 Pain in right hip: Secondary | ICD-10-CM | POA: Diagnosis not present

## 2023-05-10 NOTE — Therapy (Signed)
 OUTPATIENT PHYSICAL THERAPY LOWER EXTREMITY TREATMENT   Patient Name: Regina [name] MRN: [id number] DOB:1941/01/11, 83 y.o., female Today's Date: 05/10/2023  END OF SESSION:  PT End of Session - 05/10/23 1453     Visit Number 5    Date for PT Re-Evaluation 06/13/23    Authorization Type Medicare/BCBS    PT Start Time 1450    PT Stop Time 1530    PT Time Calculation (min) 40 min    Activity Tolerance Patient tolerated treatment well             Past Medical History:  Diagnosis Date   Anxiety    Arthritis    neck   Asthma    Cervical spondylolysis    Cervical spondylolysis    Depression    Hashimoto's disease    Insulin  resistance    PONV (postoperative nausea and vomiting)    hasn't had surgery since 1993   Past Surgical History:  Procedure Laterality Date   ANTERIOR CERVICAL DECOMP/DISCECTOMY FUSION N/A 12/06/2017   Procedure: ANTERIOR CERVICAL DECOMPRESSION/DISCECTOMY FUSION, CERVICAL 3- CERVICAL 4, CERVICAL 4- CERVICAL 5, CERVICAL 5- CERVICAL 6;  Surgeon: Lanis Pupa, MD;  Location: [facility] OR;  Service: Neurosurgery;  Laterality: N/A;  ANTERIOR CERVICAL DECOMPRESSION/DISCECTOMY FUSION, CERVICAL THREE- CERVICAL FOUR, CERVICAL FOUR- CERVICAL FIVE, CERVICAL FIVE- CERVICAL SIX   APPENDECTOMY     BREAST SURGERY     left biopsy   CESAREAN SECTION     x2   CHOLECYSTECTOMY     DENTAL SURGERY     teeth removal   OVARY SURGERY     ovarian wedge   TONSILLECTOMY     Patient Active Problem List   Diagnosis Date Noted   Chronic cough 03/10/2019   Leukocytosis    Hypokalemia    Labile blood pressure    Benign essential HTN    Postoperative pain    Hypertensive crisis    Diabetes 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)    Hypoalbuminemia due to protein-calorie malnutrition (HCC)    Acute blood loss anemia    Myelopathy (HCC) 12/10/2017   HNP (herniated nucleus pulposus) with myelopathy, cervical    Essential hypertension    Asthma    Insulin  resistance     Hashimoto's disease    Cervical spondylolysis    Anxiety    Hyponatremia 12/01/2017   Hypothyroidism 12/01/2017    PCP: Douglass Ivanoff MD  REFERRING PROVIDER: Dyane Dries FNP  REFERRING DIAG: F74.448 acute pain of right hip  THERAPY DIAG:  Right hip pain Rationale for Evaluation and Treatment: Rehabilitation  ONSET DATE:  1 year, worsened Aug/Sept  SUBJECTIVE:   SUBJECTIVE STATEMENT: Tuesday was fine. With getting off the toilet in the hall bathroom hurts my hip for the rest of the day. Bad jolts every 2-3 days, less frequent and less debilitating;  putting dishes up in the cabinet can be a trigger.  EVAL; Patient presents with lateral right hip.  Been there a while, maybe a year, but seemed to worse after attending grandson's soccer game in August or September.  I never know when it's going to hit.  It goes out now on a daily basis and I can't even walk.  My husband has to come help me.  Uses a cane sometimes at home but not using today.    Feb 3 appt with Dr. 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 Yes NPRS scale: 2/10 Pain location: right lateral hip Pain orientation: Right  PAIN TYPE: sharp Pain description: intermittent  Aggravating factors: walking, especially, hard chairs, lying on that side; getting up from low toilet, getting in/out of [employer] Relieving factors: get to a chair   PRECAUTIONS: None   WEIGHT BEARING RESTRICTIONS: No  FALLS:  Has patient fallen in last 6 months? 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 X-ray December 2024 DIAGNOSTIC FINDINGS: DG HIP (WITH OR WITHOUT PELVIS) 2-3V RIGHT   COMPARISON:  None available   FINDINGS: Mild-to-moderate joint space loss and spurring of the right hip joint. No fracture or dislocation. Soft tissues are unremarkable.   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    PATIENT SURVEYS:  FOTO 47%  COGNITION: Overall cognitive status: Within functional limits for tasks assessed      MUSCLE LENGTH: Mild limitation of bil HS and hip flexor lengths  POSTURE: increased thoracic kyphosis  PALPATION: Increased back pain with long axis joint distraction  LOWER EXTREMITY ROM: Pain with passive right hip internal rotation    Active ROM Right eval Left eval  Hip flexion 90 95  Hip extension 5 10  Hip abduction    Hip adduction    Hip internal rotation 20 30  Hip external rotation 35 40  Knee flexion    Knee extension    Ankle dorsiflexion    Ankle plantarflexion    Ankle inversion    Ankle eversion     (Blank rows = not tested)  LOWER EXTREMITY MMT: Pelvic drop with SLS 3 sec with pelvic drop right more so than left   MMT Right eval Left eval  Hip flexion 4 4  Hip extension 4 4  Hip abduction 4- back pain 4- Back pain  Hip adduction    Hip internal rotation    Hip external rotation 4- 4-  Knee flexion    Knee extension 4 4  Ankle dorsiflexion    Ankle plantarflexion    Ankle inversion    Ankle eversion     (Blank rows = not tested)  LOWER EXTREMITY SPECIAL TESTS:  Negative slump test Difficulty with SLR right and left but not painful  FUNCTIONAL TESTS:  5x STS no UE use:  24.72 sec TUG 22.11 sec mild unsteadiness no assistive device  used  GAIT:  Comments: decreased gait speed  TREATMENT DATE:  05/10/23  Nu-Step L3 seat 4, arms 6: 8 min while discussing status Seated blue band clams isometric 5 sec holds 10x Seated right LAQ 4#  x 10 Seated up and over the weight on the floor  x 10 right only 4 # Standing on edge of 4 inch step with 4# ankle weight on with leg swings 20x Standing circles sliders with 4 lb ankle weights  x 10 right only Standing 3 way sliders with 4 lb ankle weights x 10 right only Standing leaning on counter with right bent knee hip extension 10x right only Seated green band HS isometric 5 sec hold 10x right only Therapeutic activity: sit to stand, standing, walking 05/08/23  Nu-Step L1 seat 4, arms 6: 10 min while discussing status Seated blue loop clams 5 sec holds 10x Seated LAQ 3# 2 x 10 Seated March x 20 with 3# Seated up and over the weight on the floor 2 x 10 each LE with 3 # Standing hip extension sliders with 3 lb ankle weights 2 x 10 each LE Standing lateral sliders with 3 lb ankle weights 2 x 10 each LE Sit to stand x 8  05/01/23  Nu-Step L1 seat 4, arms 6: 10 min while discussing status Seated blue loop clams 5 sec holds 10x Seated LAQ 3# 10x Seated up and over the weight on the floor 10x Standing hip extension sliders with 3 lb ankle weights Standing lateral sliders with 3 lb ankle weights Sit to stand x 8  04/26/23  Nu-Step L1 seat 4, arms 6: 10 min while discussing status Standing: lateral sliders, circles each way, holding 3# ipsilateral hip flexion 7x each right/left Seated green band clams 5 sec holds 10x Seated LAQ 3# 10x Seated HS curls with slider green band 10x Seated up and over the weight on the floor 10x Sit to stand 7x    PATIENT EDUCATION:  Education details: Educated patient on anatomy and physiology of current symptoms, prognosis,  plan of care as well as initial self care strategies to promote recovery; use of heat for pain control; cane in left hand to offset load on right hip Person educated: Patient Education method: Explanation Education comprehension: verbalized understanding  HOME EXERCISE PROGRAM: Access Code: [email or web address] URL: [email or web address] Date: 05/08/2023 Prepared by: Delon Haddock  Exercises - Supine ITB Stretch with Strap  - 1 x daily - 7 x weekly - 1 sets - 3 reps - 30 sec hold - Standing Hip Extension with Counter Support  - 1 x daily - 7 x weekly - 2 sets - 10 reps - Standing 4-Way Leg Reach with Counter Support  - 1 x daily - 7 x weekly - 2 sets - 10 reps - Sit to Stand  - 1 x daily - 7 x weekly - 1 sets - 10 reps - Seated Long Arc Quad  - 1 x daily - 7 x weekly - 2 sets - 10 reps - Seated March  - 1 x daily - 7 x weekly - 2 sets - 10 reps  ASSESSMENT:  CLINICAL IMPRESSION: Able to progress resistance and challenge level with several ex's today.  Her pain level remains low throughout session but reports continued occurrences of intense pain although less frequent and intense.  Therapist closely monitoring response and providing verbal cues for optimal technique with exercises.    OBJECTIVE IMPAIRMENTS: decreased activity tolerance, decreased balance, decreased mobility, difficulty walking, decreased ROM, decreased strength, impaired perceived functional ability, and pain.  ACTIVITY LIMITATIONS: sitting, sleeping, toileting, and locomotion level  PARTICIPATION LIMITATIONS: meal prep, cleaning, laundry, driving, and community activity  PERSONAL FACTORS: Time since onset of injury/illness/exacerbation and 1-2 comorbidities: kidney disease (unable to take some OTC meds for pain control) diabetes  are also affecting patient's functional outcome.   REHAB POTENTIAL: Good  CLINICAL DECISION MAKING: Stable/uncomplicated  EVALUATION COMPLEXITY: Low   GOALS: Goals reviewed  with patient? Yes  SHORT TERM GOALS: Target date: 05/16/2023   The patient will demonstrate knowledge of basic self care strategies and exercises to promote healing   Baseline: Goal status: MET 05/08/23  2.  Improved LE strength and ease with sit to stand from lower toilet  with 5x STS test improved to 21 sec Baseline:  Goal status: In progress but likely met  3.  The patient will have improved Timed Up and Go (TUG) time to   19    sec indicating improved gait speed and LE strength  Baseline:  Goal status: INITIAL  4.  The patient will report a 30% improvement in pain levels with functional activities which are currently difficult including walking, sitting in hard chairs and getting in and out of her [employer] Baseline:  Goal status: MET 05/08/23    LONG TERM GOALS: Target date: 06/13/2023    The patient will be independent in a safe self progression of a home exercise program to promote further recovery of function  Baseline:  Goal status: INITIAL  2.  The patient will have improved right hip strength to at least 4+/5 needed for standing, walking longer distances and descending stairs at home and in the community  Baseline:  Goal status: INITIAL  3.  Improved bil LE strength needed to step on/off higher step for upcoming train travel Baseline:  Goal status: INITIAL  4.  The patient will have improved gait stamina and speed needed to ambulate 600 feet in 6 minutes  Baseline:  Goal status: INITIAL  5.  The patient will have improved FOTO score to    58%   indicating improved function with less pain  Baseline:  Goal status: INITIAL    PLAN:  PT FREQUENCY: 2x/week  PT DURATION: 8 weeks  PLANNED INTERVENTIONS: 97164- PT Re-evaluation, 97110-Therapeutic exercises, 97530- Therapeutic activity, 97112- Neuromuscular re-education, 97535- Self Care, 02859- Manual therapy, [phone]- Aquatic Therapy, 97014- Electrical stimulation (unattended), [id number]- Electrical stimulation  (manual), 97035- Ultrasound, Patient/Family education, Balance training, Stair training, Taping, Dry Needling, Joint mobilization, Cryotherapy, and Moist heat  PLAN FOR NEXT SESSION:  Nustep,  right LE strengthening especially glutes; manual therapy: bent knee hip mobs; functional strength needed for preparation for upcoming trip to [place]  in Feb  Marybell Robards, PT 05/10/23 6:31 PM Phone: [phone] Fax: [phone] [facility] [address] [place], [place] [address] Phone # [phone] Fax [phone]

## 2023-05-15 ENCOUNTER — Encounter: Payer: Medicare Other | Admitting: Physical Therapy

## 2023-05-17 ENCOUNTER — Encounter: Payer: Medicare Other | Admitting: Physical Therapy

## 2023-05-22 ENCOUNTER — Emergency Department (HOSPITAL_BASED_OUTPATIENT_CLINIC_OR_DEPARTMENT_OTHER): Payer: Medicare Other | Admitting: Radiology

## 2023-05-22 ENCOUNTER — Emergency Department (HOSPITAL_BASED_OUTPATIENT_CLINIC_OR_DEPARTMENT_OTHER): Payer: Medicare Other

## 2023-05-22 ENCOUNTER — Emergency Department (HOSPITAL_BASED_OUTPATIENT_CLINIC_OR_DEPARTMENT_OTHER)
Admission: EM | Admit: 2023-05-22 | Discharge: 2023-05-23 | Disposition: A | Discharge status: Home / Self Care | Payer: Medicare Other | Attending: Emergency Medicine | Admitting: Emergency Medicine

## 2023-05-22 ENCOUNTER — Other Ambulatory Visit: Payer: Self-pay

## 2023-05-22 ENCOUNTER — Encounter (HOSPITAL_BASED_OUTPATIENT_CLINIC_OR_DEPARTMENT_OTHER): Payer: Self-pay | Admitting: Emergency Medicine

## 2023-05-22 DIAGNOSIS — E1122 Type 2 diabetes mellitus with diabetic chronic kidney disease: Secondary | ICD-10-CM | POA: Insufficient documentation

## 2023-05-22 DIAGNOSIS — J209 Acute bronchitis, unspecified: Secondary | ICD-10-CM | POA: Diagnosis not present

## 2023-05-22 DIAGNOSIS — N189 Chronic kidney disease, unspecified: Secondary | ICD-10-CM | POA: Insufficient documentation

## 2023-05-22 DIAGNOSIS — R55 Syncope and collapse: Secondary | ICD-10-CM | POA: Insufficient documentation

## 2023-05-22 DIAGNOSIS — I700 Atherosclerosis of aorta: Secondary | ICD-10-CM | POA: Diagnosis not present

## 2023-05-22 DIAGNOSIS — M2578 Osteophyte, vertebrae: Secondary | ICD-10-CM | POA: Insufficient documentation

## 2023-05-22 DIAGNOSIS — Z7984 Long term (current) use of oral hypoglycemic drugs: Secondary | ICD-10-CM | POA: Insufficient documentation

## 2023-05-22 DIAGNOSIS — I129 Hypertensive chronic kidney disease with stage 1 through stage 4 chronic kidney disease, or unspecified chronic kidney disease: Secondary | ICD-10-CM | POA: Insufficient documentation

## 2023-05-22 DIAGNOSIS — R059 Cough, unspecified: Secondary | ICD-10-CM | POA: Diagnosis not present

## 2023-05-22 DIAGNOSIS — E119 Type 2 diabetes mellitus without complications: Secondary | ICD-10-CM | POA: Insufficient documentation

## 2023-05-22 DIAGNOSIS — W1839XA Other fall on same level, initial encounter: Secondary | ICD-10-CM | POA: Diagnosis not present

## 2023-05-22 DIAGNOSIS — M47812 Spondylosis without myelopathy or radiculopathy, cervical region: Secondary | ICD-10-CM | POA: Diagnosis not present

## 2023-05-22 DIAGNOSIS — I6782 Cerebral ischemia: Secondary | ICD-10-CM | POA: Insufficient documentation

## 2023-05-22 DIAGNOSIS — I10 Essential (primary) hypertension: Secondary | ICD-10-CM | POA: Insufficient documentation

## 2023-05-22 DIAGNOSIS — S161XXA Strain of muscle, fascia and tendon at neck level, initial encounter: Secondary | ICD-10-CM

## 2023-05-22 LAB — CBC
HCT: 42.9 % (ref 36.0–46.0)
Hemoglobin: 14.2 g/dL (ref 12.0–15.0)
MCH: 31.4 pg (ref 26.0–34.0)
MCHC: 33.1 g/dL (ref 30.0–36.0)
MCV: 94.9 fL (ref 80.0–100.0)
Platelets: 266 10*3/uL (ref 150–400)
RBC: 4.52 MIL/uL (ref 3.87–5.11)
RDW: 14.2 % (ref 11.5–15.5)
WBC: 4.6 10*3/uL (ref 4.0–10.5)
nRBC: 0 % (ref 0.0–0.2)

## 2023-05-22 LAB — URINALYSIS, ROUTINE W REFLEX MICROSCOPIC
Bacteria, UA: NONE SEEN
Bilirubin Urine: NEGATIVE
Glucose, UA: 100 mg/dL — AB
Hgb urine dipstick: NEGATIVE
Ketones, ur: NEGATIVE mg/dL
Nitrite: NEGATIVE
Protein, ur: 30 mg/dL — AB
Specific Gravity, Urine: 1.028 (ref 1.005–1.030)
pH: 5.5 (ref 5.0–8.0)

## 2023-05-22 LAB — BASIC METABOLIC PANEL
Anion gap: 7 (ref 5–15)
BUN: 34 mg/dL — ABNORMAL HIGH (ref 8–23)
CO2: 27 mmol/L (ref 22–32)
Calcium: 9.4 mg/dL (ref 8.9–10.3)
Chloride: 100 mmol/L (ref 98–111)
Creatinine, Ser: 2.05 mg/dL — ABNORMAL HIGH (ref 0.44–1.00)
GFR, Estimated: 24 mL/min — ABNORMAL LOW (ref >60)
Glucose, Bld: 92 mg/dL (ref 70–99)
Potassium: 3.8 mmol/L (ref 3.5–5.1)
Sodium: 134 mmol/L — ABNORMAL LOW (ref 135–145)

## 2023-05-22 LAB — CBG MONITORING, ED: Glucose-Capillary: 89 mg/dL (ref 70–99)

## 2023-05-22 LAB — TROPONIN I (HIGH SENSITIVITY): Troponin I (High Sensitivity): 13 ng/L (ref <18)

## 2023-05-22 MED ORDER — SODIUM CHLORIDE 0.9 % IV BOLUS
1000.0000 mL | Freq: Once | INTRAVENOUS | Status: AC
  Administered 2023-05-23: 1000 mL via INTRAVENOUS

## 2023-05-22 MED ORDER — CLONIDINE HCL 0.1 MG PO TABS
0.1000 mg | ORAL_TABLET | Freq: Once | ORAL | Status: AC
  Administered 2023-05-23: 0.1 mg via ORAL
  Filled 2023-05-22: qty 1

## 2023-05-22 NOTE — ED Triage Notes (Signed)
 Persistent cough x a few weeks Yesterday had syncopal episode and fell. Denies injury from fall Does report headache last night that has resolved

## 2023-05-22 NOTE — ED Provider Notes (Signed)
  EMERGENCY DEPARTMENT AT [facility] Provider Note   CSN: [id number] Arrival date & time: 05/22/23  1926     History  Chief Complaint  Patient presents with   Cough   Loss of Consciousness    Regina [name] is a 83 y.o. female.  Patient is a an 83 year old female with past medical history of chronic renal insufficiency, hypertension, type 2 diabetes.  Patient presenting today for evaluation of a syncopal episode.  She reports cough for several weeks, then earlier this morning experienced a fall.  She attempted to stand up and walk from the bedroom to the back door when she suddenly passed out and found herself on the floor.  She initially went to urgent care, then was referred here for further evaluation.  She does describe some neck discomfort, but is uncertain as to whether or not she bumped her head.  The history is provided by the patient.       Home Medications Prior to Admission medications   Medication Sig Start Date End Date Taking? Authorizing Provider  acetaminophen (TYLENOL) 325 MG tablet Take 1-2 tablets (325-650 mg total) by mouth every 4 (four) hours as needed for mild pain. 12/20/17   Angiulli, Mcarthur Rossetti, PA-C  albuterol (PROVENTIL HFA;VENTOLIN HFA) 108 (90 Base) MCG/ACT inhaler Inhale 1-2 puffs into the lungs every 6 (six) hours as needed for wheezing or shortness of breath (asthma/coughing).    [provider]  ALPRAZolam Prudy Feeler) 0.25 MG tablet Take 1 tablet (0.25 mg total) by mouth 3 (three) times daily as needed for anxiety. 12/20/17   Angiulli, Mcarthur Rossetti, PA-C  budesonide-formoterol (SYMBICORT) 160-4.5 MCG/ACT inhaler Inhale 2 puffs into the lungs every 12 (twelve) hours. 11/26/18   Icard, Elige Radon L, DO  escitalopram (LEXAPRO) 10 MG tablet Take 1 tablet (10 mg total) by mouth at bedtime. 12/20/17   Angiulli, Mcarthur Rossetti, PA-C  ferrous sulfate 325 (65 FE) MG EC tablet Take 325 mg by mouth daily.    [provider]   furosemide (LASIX) 20 MG tablet Take 20 mg by mouth daily. 01/26/20   [provider]  levothyroxine (SYNTHROID) 75 MCG tablet Take 1 tablet (75 mcg total) by mouth daily before breakfast. 12/20/17   Angiulli, Mcarthur Rossetti, PA-C  metaxalone (SKELAXIN) 800 MG tablet Take 1 tablet (800 mg total) by mouth 3 (three) times daily as needed for muscle spasms. 12/20/17   Angiulli, Mcarthur Rossetti, PA-C  metFORMIN (GLUCOPHAGE) 850 MG tablet Take 850 mg by mouth 2 (two) times daily.     [provider]  montelukast (SINGULAIR) 10 MG tablet Take 1 tablet (10 mg total) by mouth at bedtime. 12/20/17   Angiulli, Mcarthur Rossetti, PA-C  omeprazole (PRILOSEC) 40 MG capsule TAKE 1 CAPSULE BY MOUTH ONCE DAILY 30 MINUTES BEFORE MORNING MEAL 11/11/18   [provider]  senna-docusate (SENOKOT-S) 8.6-50 MG tablet Take 1 tablet by mouth 2 (two) times daily. 12/20/17   Angiulli, Mcarthur Rossetti, PA-C  triamterene-hydrochlorothiazide (MAXZIDE-25) 37.5-25 MG tablet  11/21/18   [provider]  VIRTUSSIN A/C 100-10 MG/5ML syrup TAKE 5 ML BY MOUTH EVERY 6 HOURS AS NEEDED FOR COUGH FOR 5 DAYS 10/09/18   [provider]  vitamin B-12 (CYANOCOBALAMIN) 100 MCG tablet Take 100 mcg by mouth daily.    [provider]  Vitamin D, Ergocalciferol, (DRISDOL) 1.25 MG (50000 UT) CAPS capsule TAKE 1 CAPSULE BY MOUTH ONCE A WEEK FOR 8 WEEKS WHEN FINISHED CAN TAKE 1000 UNITS OVER THE COUNTER DAILY  10/21/18   [provider]      Allergies    Ceclor [cefaclor], Naproxen, Sulfa antibiotics, Amlodipine, Augmentin [amoxicillin-pot clavulanate], Other, and Valsartan    Review of Systems   Review of Systems  All other systems reviewed and are negative.   Physical Exam Updated Vital Signs BP (!) 193/73   Pulse 82   Temp 97.8 F (36.6 C)   Resp 18   SpO2 100%  Physical Exam Vitals and nursing note reviewed.  Constitutional:      General: She is not in acute distress.    Appearance: She is well-developed.  She is not diaphoretic.  HENT:     Head: Normocephalic and atraumatic.  Eyes:     Extraocular Movements: Extraocular movements intact.     Pupils: Pupils are equal, round, and reactive to light.  Cardiovascular:     Rate and Rhythm: Normal rate and regular rhythm.     Heart sounds: No murmur heard.    No friction rub. No gallop.  Pulmonary:     Effort: Pulmonary effort is normal. No respiratory distress.     Breath sounds: Normal breath sounds. No wheezing.  Abdominal:     General: Bowel sounds are normal. There is no distension.     Palpations: Abdomen is soft.     Tenderness: There is no abdominal tenderness.  Musculoskeletal:        General: Normal range of motion.     Cervical back: Normal range of motion and neck supple.  Skin:    General: Skin is warm and dry.  Neurological:     General: No focal deficit present.     Mental Status: She is alert and oriented to person, place, and time.     Cranial Nerves: No cranial nerve deficit.     Motor: No weakness.     Coordination: Coordination normal.     ED Results / Procedures / Treatments   Labs (all labs ordered are listed, but only abnormal results are displayed) Labs Reviewed  BASIC METABOLIC PANEL - Abnormal; Notable for the following components:      Result Value   Sodium 134 (*)    BUN 34 (*)    Creatinine, Ser 2.05 (*)    GFR, Estimated 24 (*)    All other components within normal limits  URINALYSIS, ROUTINE W REFLEX MICROSCOPIC - Abnormal; Notable for the following components:   Glucose, UA 100 (*)    Protein, ur 30 (*)    Leukocytes,Ua MODERATE (*)    All other components within normal limits  CBC  CBG MONITORING, ED  TROPONIN I (HIGH SENSITIVITY)  TROPONIN I (HIGH SENSITIVITY)    EKG ED ECG REPORT   Date: 05/22/2023  Rate: 74  Rhythm: normal sinus rhythm  QRS Axis: normal  Intervals: normal  ST/T Wave abnormalities: nonspecific T wave changes  Conduction Disutrbances:none  Narrative  Interpretation:   Old EKG Reviewed: unchanged  I have personally reviewed the EKG tracing and agree with the computerized printout as noted.   Radiology DG Chest 2 View Result Date: 05/22/2023 CLINICAL DATA:  Persistent cough over the last few weeks. EXAM: CHEST - 2 VIEW COMPARISON:  04/12/2022 FINDINGS: Heart size is normal. Chronic aortic atherosclerosis. There is central bronchial thickening but there is no infiltrate, collapse or effusion. No abnormal bone finding of significance. IMPRESSION: Central bronchial thickening. No infiltrate, collapse or effusion. Aortic atherosclerosis. Electronically Signed   By: Paulina Fusi M.D.   On: 05/22/2023 20:17  CT Cervical Spine Wo Contrast Result Date: 05/22/2023 CLINICAL DATA:  Cough.  Loss of consciousness.  Fall. EXAM: CT CERVICAL SPINE WITHOUT CONTRAST TECHNIQUE: Multidetector CT imaging of the cervical spine was performed without intravenous contrast. Multiplanar CT image reconstructions were also generated. RADIATION DOSE REDUCTION: This exam was performed according to the departmental dose-optimization program which includes automated exposure control, adjustment of the mA and/or kV according to patient size and/or use of iterative reconstruction technique. COMPARISON:  11/20/2018 FINDINGS: Alignment: No traumatic malalignment. Skull base and vertebrae: No fracture or focal bone lesion. Distant ACDF C3 through C6 with solid union. Soft tissues and spinal canal: Negative Disc levels: The foramen magnum is widely patent. There is ordinary mild osteoarthritis of the C1-2 articulation but no encroachment upon the neural structures. C2-3: Degenerative facet arthritis.  Bilateral foraminal stenosis. C3 through C6: Previous ACDF. Solid union with sufficient patency of the canal. Chronic bony foraminal narrowing. C6-7: Spondylosis with endplate osteophytes. Mild canal narrowing. Bilateral bony foraminal narrowing. C7-T1: Endplate osteophytes. No canal  narrowing. Bilateral bony foraminal narrowing. Upper chest: Mild upper lobe scarring.  No active process. Other: None IMPRESSION: 1. No acute or traumatic finding. Distant ACDF C3 through C6 with solid union. 2. C2-3: Degenerative facet arthritis. Bilateral foraminal stenosis. 3. C6-7: Spondylosis with endplate osteophytes. Mild canal narrowing. Bilateral bony foraminal narrowing. 4. C7-T1: Endplate osteophytes. No canal narrowing. Bilateral bony foraminal narrowing. Electronically Signed   By: Paulina Fusi M.D.   On: 05/22/2023 20:17   CT Head Wo Contrast Result Date: 05/22/2023 CLINICAL DATA:  Head trauma.  Cough.  Loss of consciousness. EXAM: CT HEAD WITHOUT CONTRAST TECHNIQUE: Contiguous axial images were obtained from the base of the skull through the vertex without intravenous contrast. RADIATION DOSE REDUCTION: This exam was performed according to the departmental dose-optimization program which includes automated exposure control, adjustment of the mA and/or kV according to patient size and/or use of iterative reconstruction technique. COMPARISON:  None Available. FINDINGS: Brain: Age related volume loss. Chronic small-vessel ischemic changes of the white matter. No sign of acute infarction, mass lesion, hemorrhage, hydrocephalus or extra-axial collection. Vascular: There is atherosclerotic calcification of the major vessels at the base of the brain. Skull: Negative Sinuses/Orbits: Clear/normal Other: None IMPRESSION: No acute CT finding. Age related volume loss. Chronic small-vessel ischemic changes of the white matter. Electronically Signed   By: Paulina Fusi M.D.   On: 05/22/2023 20:14    Procedures Procedures  {Document cardiac monitor, telemetry assessment procedure when appropriate:1}  Medications Ordered in ED Medications  sodium chloride 0.9 % bolus 1,000 mL (has no administration in time range)  cloNIDine (CATAPRES) tablet 0.1 mg (has no administration in time range)    ED Course/  Medical Decision Making/ A&P   {   Click here for ABCD2, HEART and other calculatorsREFRESH Note before signing :1}                              Medical Decision Making Amount and/or Complexity of Data Reviewed Labs: ordered. Radiology: ordered.  Risk Prescription drug management.   ***  {Document critical care time when appropriate:1} {Document review of labs and clinical decision tools ie heart score, Chads2Vasc2 etc:1}  {Document your independent review of radiology images, and any outside records:1} {Document your discussion with family members, caretakers, and with consultants:1} {Document social determinants of health affecting pt's care:1} {Document your decision making why or why not admission, treatments were needed:1} Final  Clinical Impression(s) / ED Diagnoses Final diagnoses:  None    Rx / DC Orders ED Discharge Orders     None

## 2023-05-23 DIAGNOSIS — J209 Acute bronchitis, unspecified: Secondary | ICD-10-CM | POA: Diagnosis not present

## 2023-05-23 LAB — RESP PANEL BY RT-PCR (RSV, FLU A&B, COVID)  RVPGX2
Influenza A by PCR: NEGATIVE
Influenza B by PCR: NEGATIVE
Resp Syncytial Virus by PCR: NEGATIVE
SARS Coronavirus 2 by RT PCR: NEGATIVE

## 2023-05-23 LAB — TROPONIN I (HIGH SENSITIVITY): Troponin I (High Sensitivity): 13 ng/L (ref <18)

## 2023-05-23 MED ORDER — AZITHROMYCIN 250 MG PO TABS: 250.0000 mg | ORAL_TABLET | Freq: Every day | ORAL | 0 refills | Status: DC

## 2023-05-23 MED ORDER — AZITHROMYCIN 250 MG PO TABS
500.0000 mg | ORAL_TABLET | Freq: Once | ORAL | Status: AC
  Administered 2023-05-23: 500 mg via ORAL
  Filled 2023-05-23: qty 2

## 2023-05-23 NOTE — Discharge Instructions (Signed)
 Begin taking Zithromax as prescribed.  Continue other medications as previously prescribed.  Return to the emergency department if you experience any new and/or concerning issues.

## 2023-05-30 ENCOUNTER — Other Ambulatory Visit: Payer: Self-pay | Admitting: Family Medicine

## 2023-05-30 ENCOUNTER — Ambulatory Visit
Admission: RE | Admit: 2023-05-30 | Discharge: 2023-05-30 | Disposition: A | Discharge status: Home / Self Care | Payer: Medicare Other | Source: Ambulatory Visit | Attending: Family Medicine | Admitting: Family Medicine

## 2023-05-30 DIAGNOSIS — R0602 Shortness of breath: Secondary | ICD-10-CM

## 2023-06-05 ENCOUNTER — Encounter: Payer: Medicare Other | Admitting: Physical Therapy

## 2023-06-07 ENCOUNTER — Encounter: Payer: Medicare Other | Admitting: Physical Therapy

## 2023-06-12 ENCOUNTER — Ambulatory Visit: Payer: Medicare Other | Admitting: Physical Therapy

## 2023-06-14 ENCOUNTER — Ambulatory Visit
Admission: RE | Admit: 2023-06-14 | Discharge: 2023-06-14 | Disposition: A | Discharge status: Home / Self Care | Source: Ambulatory Visit | Attending: Family Medicine | Admitting: Family Medicine

## 2023-06-14 ENCOUNTER — Other Ambulatory Visit: Payer: Self-pay | Admitting: Family Medicine

## 2023-06-14 DIAGNOSIS — R062 Wheezing: Secondary | ICD-10-CM

## 2023-06-18 ENCOUNTER — Other Ambulatory Visit (HOSPITAL_COMMUNITY): Payer: Self-pay | Admitting: Family Medicine

## 2023-06-18 DIAGNOSIS — M79669 Pain in unspecified lower leg: Secondary | ICD-10-CM

## 2023-06-22 ENCOUNTER — Ambulatory Visit (HOSPITAL_COMMUNITY)
Admission: RE | Admit: 2023-06-22 | Discharge: 2023-06-22 | Disposition: A | Discharge status: Home / Self Care | Source: Ambulatory Visit | Attending: Family Medicine | Admitting: Family Medicine

## 2023-06-22 ENCOUNTER — Ambulatory Visit (HOSPITAL_BASED_OUTPATIENT_CLINIC_OR_DEPARTMENT_OTHER)
Admission: RE | Admit: 2023-06-22 | Discharge: 2023-06-22 | Disposition: A | Discharge status: Home / Self Care | Source: Ambulatory Visit | Attending: Family Medicine | Admitting: Family Medicine

## 2023-06-22 DIAGNOSIS — M79669 Pain in unspecified lower leg: Secondary | ICD-10-CM | POA: Insufficient documentation

## 2023-06-23 LAB — VAS US ABI WITH/WO TBI
Left ABI: 0.6
Right ABI: 0.4

## 2023-06-27 ENCOUNTER — Ambulatory Visit: Attending: Family Medicine | Admitting: Physical Therapy

## 2023-06-27 DIAGNOSIS — M25651 Stiffness of right hip, not elsewhere classified: Secondary | ICD-10-CM | POA: Diagnosis present

## 2023-06-27 DIAGNOSIS — M6281 Muscle weakness (generalized): Secondary | ICD-10-CM | POA: Diagnosis present

## 2023-06-27 DIAGNOSIS — M25551 Pain in right hip: Secondary | ICD-10-CM | POA: Diagnosis present

## 2023-06-27 NOTE — Therapy (Signed)
 OUTPATIENT PHYSICAL THERAPY LOWER EXTREMITY TREATMENT/RECERTIFICATION   Patient Name: Regina [name] MRN: [id number] DOB:March 24, 1941, 83 y.o., female Today's Date: 06/27/2023  END OF SESSION:  PT End of Session - 06/27/23 1535     Visit Number 6    Date for PT Re-Evaluation 08/22/23    Authorization Type Medicare/BCBS    Progress Note Due on Visit 10    PT Start Time 1532    PT Stop Time 1615    PT Time Calculation (min) 43 min    Activity Tolerance Patient tolerated treatment well             Past Medical History:  Diagnosis Date   Anxiety    Arthritis    neck   Asthma    Cervical spondylolysis    Cervical spondylolysis    Depression    Hashimoto's disease    Insulin resistance    PONV (postoperative nausea and vomiting)    hasn't had surgery since 1993   Past Surgical History:  Procedure Laterality Date   ANTERIOR CERVICAL DECOMP/DISCECTOMY FUSION N/A 12/06/2017   Procedure: ANTERIOR CERVICAL DECOMPRESSION/DISCECTOMY FUSION, CERVICAL 3- CERVICAL 4, CERVICAL 4- CERVICAL 5, CERVICAL 5- CERVICAL 6;  Surgeon: Lisbeth Renshaw, MD;  Location: [facility] OR;  Service: Neurosurgery;  Laterality: N/A;  ANTERIOR CERVICAL DECOMPRESSION/DISCECTOMY FUSION, CERVICAL THREE- CERVICAL FOUR, CERVICAL FOUR- CERVICAL FIVE, CERVICAL FIVE- CERVICAL SIX   APPENDECTOMY     BREAST SURGERY     left biopsy   CESAREAN SECTION     x2   CHOLECYSTECTOMY     DENTAL SURGERY     teeth removal   OVARY SURGERY     ovarian wedge   TONSILLECTOMY     Patient Active Problem List   Diagnosis Date Noted   Chronic cough 03/10/2019   Leukocytosis    Hypokalemia    Labile blood pressure    Benign essential HTN    Postoperative pain    Hypertensive crisis    Diabetes 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)    Hypoalbuminemia due to protein-calorie malnutrition (HCC)    Acute blood loss anemia    Myelopathy (HCC) 12/10/2017   HNP (herniated nucleus pulposus) with myelopathy, cervical     Essential hypertension    Asthma    Insulin resistance    Hashimoto's disease    Cervical spondylolysis    Anxiety    Hyponatremia 12/01/2017   Hypothyroidism 12/01/2017    PCP: Shirlean Mylar MD  REFERRING PROVIDER: Jorge Ny FNP  REFERRING DIAG: M25.551 acute pain of right hip  THERAPY DIAG:  Right hip pain Rationale for Evaluation and Treatment: Rehabilitation  ONSET DATE:  1 year, worsened Aug/Sept  SUBJECTIVE:   SUBJECTIVE STATEMENT: I've been so sick with pneumonia and bronchitis.  I've passed out twice.  Waiting for vascular consult appt for clogged arteries in legs.  Lateral hip pain diagnosed with extra calcification and oval-shaped ball . Mostly feel it with getting up.  PT recommended.  I started a new BP medicine for high BP and started on cholesterol medicine.    EVAL; Patient presents with lateral right hip.  Been there a while, maybe a year, but seemed to worse after attending grandson's soccer game in August or September.  I never know when it's going to hit.  It goes out now on a daily basis and I can't even walk.  My husband has to come help me.  Uses a cane sometimes at home but not using today.    Feb  3 appt with Dr. 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 Yes NPRS scale: 2/10 Pain location: right lateral hip Pain orientation: Right  PAIN TYPE: sharp Pain description: intermittent  Aggravating factors: walking, especially, hard chairs, lying on that side; getting up from low toilet, getting in/out of [employer] Relieving factors: get to a chair   PRECAUTIONS: None   WEIGHT BEARING RESTRICTIONS: No  FALLS:  Has patient fallen in last 6 months? 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 X-ray December 2024 DIAGNOSTIC FINDINGS: DG HIP (WITH OR WITHOUT PELVIS) 2-3V RIGHT   COMPARISON:  None available   FINDINGS: Mild-to-moderate joint space loss and spurring of the right hip joint. No fracture or dislocation. Soft tissues are unremarkable.   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    PATIENT SURVEYS:  FOTO 47%  COGNITION: Overall cognitive status: Within functional limits for tasks assessed      MUSCLE LENGTH: Mild limitation of bil HS and hip flexor lengths  POSTURE: increased thoracic kyphosis  PALPATION: Increased back pain with long axis joint distraction  LOWER EXTREMITY ROM: Pain with passive right hip internal rotation    Active ROM Right eval Left eval 3/26  Hip flexion 90 95 95 bil  Hip extension 5 10   Hip abduction     Hip adduction     Hip internal rotation 20 30   Hip external rotation 35 40   Knee flexion     Knee extension     Ankle dorsiflexion     Ankle plantarflexion     Ankle inversion     Ankle eversion      (Blank rows = not tested)  LOWER EXTREMITY MMT: Pelvic drop with SLS 3 sec with pelvic drop right more so than left   MMT Right eval Left eval 3/26  Hip flexion 4 4 4  Bil  Hip extension 4 4 4  bil  Hip abduction 4- back pain 4- Back pain 4- Bil  Hip adduction     Hip internal rotation     Hip external rotation 4- 4-   Knee flexion     Knee extension 4 4 4  Bil  Ankle dorsiflexion     Ankle plantarflexion     Ankle inversion     Ankle eversion       (Blank rows = not tested)  LOWER EXTREMITY SPECIAL TESTS:  Negative slump test Difficulty with SLR right and left but not painful  FUNCTIONAL TESTS:  5x STS no UE use:  24.72 sec TUG 22.11 sec mild unsteadiness no assistive device used  3/26:  5x STS 21.38           TUG 17.37  GAIT:  Comments: decreased gait speed  TREATMENT DATE:  06/27/23: Review of status regarding recent changes in health Seated hip abduction red band 20x Seated 5# weight resting on knee hip flexion/abduction 10x right/left Red band HS curls 10x right/left Seated red power cord single leg press out 10x right/left  5x STS TUG     05/10/23  Nu-Step L3 seat 4, arms 6: 8 min while discussing status Seated blue band clams isometric 5 sec holds 10x Seated right LAQ 4#  x 10 Seated up and over the weight on the floor  x 10 right only 4 # Standing on edge of 4 inch step with 4# ankle weight on with leg swings 20x Standing circles sliders with 4 lb ankle weights  x 10 right only Standing 3 way sliders with 4 lb ankle weights x 10 right only Standing leaning on counter with right bent knee hip extension 10x right only Seated green band HS isometric 5 sec hold 10x right only Therapeutic activity: sit to stand, standing, walking 05/08/23  Nu-Step L1 seat 4, arms 6: 10 min while discussing status Seated blue loop clams 5 sec holds 10x Seated LAQ 3# 2 x 10 Seated March x 20 with 3# Seated up and over the weight on the floor 2 x 10 each LE with 3 # Standing hip extension sliders with 3 lb ankle weights 2 x 10 each LE Standing lateral sliders with 3 lb ankle weights 2 x 10 each LE Sit to stand x 8  05/01/23  Nu-Step L1 seat 4, arms 6: 10 min while discussing status Seated blue loop clams 5 sec holds 10x Seated LAQ 3# 10x Seated up and over the weight on the floor 10x Standing hip  extension sliders with 3 lb ankle weights Standing lateral sliders with 3 lb ankle weights Sit to stand x 8  04/26/23  Nu-Step L1 seat 4, arms 6: 10 min while discussing status Standing: lateral sliders, circles each way, holding 3# ipsilateral hip flexion 7x each right/left Seated green band clams 5 sec holds 10x Seated LAQ 3# 10x Seated HS curls with slider green band 10x Seated up and over the weight on the floor 10x Sit to stand 7x    PATIENT EDUCATION:  Education details: Educated patient on anatomy and physiology of current symptoms, prognosis, plan of care as well as initial self care strategies to promote recovery; use of heat for pain control; cane in left hand to offset load on right hip Person educated: Patient Education method: Explanation Education comprehension: verbalized understanding  HOME EXERCISE PROGRAM: Access Code: [email or web address] URL: [email or web address] Date: 05/08/2023 Prepared by: Mikey Kirschner  Exercises - Supine ITB Stretch with Strap  - 1 x daily - 7 x weekly - 1 sets - 3 reps - 30 sec hold - Standing Hip Extension with Counter Support  - 1 x daily - 7 x weekly - 2 sets - 10 reps - Standing 4-Way Leg Reach with Counter Support  - 1 x daily - 7 x weekly - 2 sets - 10 reps - Sit to Stand  - 1 x daily - 7 x weekly - 1 sets - 10 reps - Seated Long Arc Quad  - 1 x daily - 7 x weekly - 2 sets - 10 reps - Seated March  - 1 x daily - 7 x weekly - 2 sets - 10 reps  ASSESSMENT:  CLINICAL IMPRESSION: Regina [name] returns to PT after a gap in care due to illness/pneumonia.  She also has new diagnoses related to her  LE pain including arterial insufficiency and right hip degenerative changes including ball shape and calcifications.  Decreased exercise intensity secondary to recent illness.  Her pain level remains low throughout session and overall better with movement.  She would benefit re-initiation of PT in order to improve mobility and strength needed for  independent living.      OBJECTIVE IMPAIRMENTS: decreased activity tolerance, decreased balance, decreased mobility, difficulty walking, decreased ROM, decreased strength, impaired perceived functional ability, and pain.   ACTIVITY LIMITATIONS: sitting, sleeping, toileting, and locomotion level  PARTICIPATION LIMITATIONS: meal prep, cleaning, laundry, driving, and community activity  PERSONAL FACTORS: Time since onset of injury/illness/exacerbation and 1-2 comorbidities: kidney disease (unable to take some OTC meds for pain control) diabetes  are also affecting patient's functional outcome.   REHAB POTENTIAL: Good  CLINICAL DECISION MAKING: Stable/uncomplicated  EVALUATION COMPLEXITY: Low   GOALS: Goals reviewed with patient? Yes  SHORT TERM GOALS: Target date: 05/16/2023   The patient will demonstrate knowledge of basic self care strategies and exercises to promote healing   Baseline: Goal status: MET 05/08/23  2.  Improved LE strength and ease with sit to stand from lower toilet  with 5x STS test improved to 21 sec Baseline:  Goal status: met 3/26 3.  The patient will have improved Timed Up and Go (TUG) time to   19    sec indicating improved gait speed and LE strength  Baseline:  Goal status: met 3/26  4.  The patient will report a 30% improvement in pain levels with functional activities which are currently difficult including walking, sitting in hard chairs and getting in and out of her [employer] Baseline:  Goal status: MET 05/08/23    LONG TERM GOALS: Target date:08/22/2023    The patient will be independent in a safe self progression of a home exercise program to promote further recovery of function  Baseline:  Goal status: INITIAL  2.  The patient will have improved right hip strength to at least 4+/5 needed for standing, walking longer distances and descending stairs at home and in the community  Baseline:  Goal status: INITIAL  3.  Improved bil LE  strength needed to step on/off higher step for upcoming train travel Baseline:  Goal status: INITIAL  4.  The patient will have improved gait stamina and speed needed to ambulate 600 feet in 6 minutes  Baseline:  Goal status: INITIAL  5.  The patient will have improved FOTO score to    58%   indicating improved function with less pain  Baseline:  Goal status: INITIAL    PLAN:  PT FREQUENCY: 2x/week  PT DURATION: 8 weeks  PLANNED INTERVENTIONS: 97164- PT Re-evaluation, 97110-Therapeutic exercises, 97530- Therapeutic activity, 97112- Neuromuscular re-education, 97535- Self Care, 40981- Manual therapy, [phone]- Aquatic Therapy, 97014- Electrical stimulation (unattended), [phone]- Electrical stimulation (manual), 97035- Ultrasound, Patient/Family education, Balance training, Stair training, Taping, Dry Needling, Joint mobilization, Cryotherapy, and Moist heat  PLAN FOR NEXT SESSION: may need a slower progression due to recent bout of pneumonia; OK to try standing LE ex's ext visit;  Nustep,  right LE strengthening especially glutes; manual therapy: bent knee hip mobs; functional strength   Lavinia Sharps, PT 06/27/23 7:10 PM Phone: [phone] Fax: [phone]  [facility] [address] [place], [place] [address] Phone # [phone] Fax [phone]

## 2023-07-03 ENCOUNTER — Ambulatory Visit: Attending: Family Medicine

## 2023-07-03 DIAGNOSIS — M25651 Stiffness of right hip, not elsewhere classified: Secondary | ICD-10-CM | POA: Diagnosis present

## 2023-07-03 DIAGNOSIS — R262 Difficulty in walking, not elsewhere classified: Secondary | ICD-10-CM | POA: Insufficient documentation

## 2023-07-03 DIAGNOSIS — M6281 Muscle weakness (generalized): Secondary | ICD-10-CM | POA: Diagnosis present

## 2023-07-03 DIAGNOSIS — R252 Cramp and spasm: Secondary | ICD-10-CM | POA: Diagnosis present

## 2023-07-03 DIAGNOSIS — M25551 Pain in right hip: Secondary | ICD-10-CM | POA: Insufficient documentation

## 2023-07-03 DIAGNOSIS — R293 Abnormal posture: Secondary | ICD-10-CM | POA: Diagnosis present

## 2023-07-03 NOTE — Therapy (Signed)
 OUTPATIENT PHYSICAL THERAPY LOWER EXTREMITY TREATMENT   Patient Name: Regina [name] MRN: [id number] DOB:02-Oct-1940, 83 y.o., female Today's Date: 07/03/2023  END OF SESSION:  PT End of Session - 07/03/23 1533     Visit Number 7    Date for PT Re-Evaluation 08/22/23    Authorization Type Medicare/BCBS    Authorization - Visit Number 7    Progress Note Due on Visit 10    PT Start Time 1530    PT Stop Time 1610    PT Time Calculation (min) 40 min    Activity Tolerance Patient tolerated treatment well    Behavior During Therapy WFL for tasks assessed/performed             Past Medical History:  Diagnosis Date   Anxiety    Arthritis    neck   Asthma    Cervical spondylolysis    Cervical spondylolysis    Depression    Hashimoto's disease    Insulin resistance    PONV (postoperative nausea and vomiting)    hasn't had surgery since 1993   Past Surgical History:  Procedure Laterality Date   ANTERIOR CERVICAL DECOMP/DISCECTOMY FUSION N/A 12/06/2017   Procedure: ANTERIOR CERVICAL DECOMPRESSION/DISCECTOMY FUSION, CERVICAL 3- CERVICAL 4, CERVICAL 4- CERVICAL 5, CERVICAL 5- CERVICAL 6;  Surgeon: Lisbeth Renshaw, MD;  Location: [facility] OR;  Service: Neurosurgery;  Laterality: N/A;  ANTERIOR CERVICAL DECOMPRESSION/DISCECTOMY FUSION, CERVICAL THREE- CERVICAL FOUR, CERVICAL FOUR- CERVICAL FIVE, CERVICAL FIVE- CERVICAL SIX   APPENDECTOMY     BREAST SURGERY     left biopsy   CESAREAN SECTION     x2   CHOLECYSTECTOMY     DENTAL SURGERY     teeth removal   OVARY SURGERY     ovarian wedge   TONSILLECTOMY     Patient Active Problem List   Diagnosis Date Noted   Chronic cough 03/10/2019   Leukocytosis    Hypokalemia    Labile blood pressure    Benign essential HTN    Postoperative pain    Hypertensive crisis    Diabetes 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)    Hypoalbuminemia due to protein-calorie malnutrition (HCC)    Acute blood loss anemia    Myelopathy (HCC)  12/10/2017   HNP (herniated nucleus pulposus) with myelopathy, cervical    Essential hypertension    Asthma    Insulin resistance    Hashimoto's disease    Cervical spondylolysis    Anxiety    Hyponatremia 12/01/2017   Hypothyroidism 12/01/2017    PCP: Shirlean Mylar MD  REFERRING PROVIDER: Jorge Ny FNP  REFERRING DIAG: M25.551 acute pain of right hip  THERAPY DIAG:  Right hip pain Rationale for Evaluation and Treatment: Rehabilitation  ONSET DATE:  1 year, worsened Aug/Sept  SUBJECTIVE:   SUBJECTIVE STATEMENT: Patient reports she is still a little weak but feeling better.    EVAL; Patient presents with lateral right hip.  Been there a while, maybe a year, but seemed to worse after attending grandson's soccer game in August or September.  I never know when it's going to hit.  It goes out now on a daily basis and I can't even walk.  My husband has to come help me.  Uses a cane sometimes at home but not using today.    Feb 3 appt with Dr. 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 Yes NPRS scale: 2/10 Pain location: right lateral hip Pain orientation: Right  PAIN TYPE: sharp Pain description: intermittent  Aggravating factors: walking, especially, hard chairs, lying on that side; getting up from low toilet, getting in/out of [employer] Relieving factors: get to a chair   PRECAUTIONS: None   WEIGHT BEARING RESTRICTIONS: No  FALLS:  Has patient fallen in last 6 months? 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 X-ray December 2024 DIAGNOSTIC FINDINGS: DG HIP (WITH OR WITHOUT PELVIS) 2-3V RIGHT   COMPARISON:  None available   FINDINGS: Mild-to-moderate joint space loss and spurring of the right hip joint. No fracture or dislocation. Soft tissues are unremarkable.   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    PATIENT SURVEYS:  FOTO 47%  COGNITION: Overall cognitive status: Within functional limits for tasks assessed      MUSCLE LENGTH: Mild limitation of bil HS and hip flexor lengths  POSTURE: increased thoracic kyphosis  PALPATION: Increased back pain with long axis joint distraction  LOWER EXTREMITY ROM: Pain with passive right hip internal rotation    Active ROM Right eval Left eval 3/26  Hip flexion 90 95 95 bil  Hip extension 5 10   Hip abduction     Hip adduction     Hip internal rotation 20 30   Hip external rotation 35 40   Knee flexion     Knee extension     Ankle dorsiflexion     Ankle plantarflexion     Ankle inversion     Ankle eversion      (Blank rows = not tested)  LOWER EXTREMITY MMT: Pelvic drop with SLS 3 sec with pelvic drop right more so than left   MMT Right eval Left eval 3/26  Hip flexion 4 4 4  Bil  Hip extension 4 4 4  bil  Hip abduction 4- back pain 4- Back pain 4- Bil  Hip adduction     Hip internal rotation     Hip external rotation 4- 4-   Knee flexion     Knee extension 4 4 4  Bil  Ankle dorsiflexion     Ankle plantarflexion     Ankle inversion     Ankle eversion      (Blank rows = not tested)  LOWER EXTREMITY SPECIAL TESTS:  Negative slump test Difficulty with SLR right and left but not painful  FUNCTIONAL TESTS:  5x STS no UE use:  24.72 sec TUG  22.11 sec mild unsteadiness no assistive device used  3/26:  5x STS 21.38           TUG 17.37  GAIT:  Comments: decreased gait speed  TREATMENT DATE:  07/03/23: Nustep x 8 min level 3 Sit to stand x 5 practicing weight shift fwd Seated hip adduction (isometric with purple ball) x 20 Seated LAQ with 4 lbs 2 x 10 Seated march with 4 lbs 2 x 10 Seated clam with yellow loop x 20 Seated hamstring curl x 20 with red tband    06/27/23: Review of status regarding recent changes in health Seated hip abduction red band 20x Seated 5# weight resting on knee hip flexion/abduction 10x right/left Red band HS curls 10x right/left Seated red power cord single leg press out 10x right/left  5x STS TUG  05/10/23  Nu-Step L3 seat 4, arms 6: 8 min while discussing status Seated blue band clams isometric 5 sec holds 10x Seated right LAQ 4#  x 10 Seated up and over the weight on the floor  x 10 right only 4 # Standing on edge of 4 inch step with 4# ankle weight on with leg swings 20x Standing circles sliders with 4 lb ankle weights  x 10 right only Standing 3 way sliders with 4 lb ankle weights x 10 right only Standing leaning on counter with right bent knee hip extension 10x right only Seated green band HS isometric 5 sec hold 10x right only Therapeutic activity: sit to stand, standing, walking   PATIENT EDUCATION:  Education details: Educated patient on anatomy and physiology of current symptoms, prognosis, plan of care as well as initial self care strategies to promote recovery; use of heat for pain control; cane in left hand to offset load on right hip Person educated: Patient Education method: Explanation Education comprehension: verbalized understanding  HOME EXERCISE PROGRAM: Access Code: [email or web address] URL: [email or web address] Date:  05/08/2023 Prepared by: Mikey Kirschner  Exercises - Supine ITB Stretch with Strap  - 1 x daily - 7 x weekly - 1 sets - 3 reps - 30 sec hold - Standing Hip Extension with Counter Support  - 1 x daily - 7 x weekly - 2 sets - 10 reps - Standing 4-Way Leg Reach with Counter Support  - 1 x daily - 7 x weekly - 2 sets - 10 reps - Sit to Stand  - 1 x daily - 7 x weekly - 1 sets - 10 reps - Seated Long Arc Quad  - 1 x daily - 7 x weekly - 2 sets - 10 reps - Seated March  - 1 x daily - 7 x weekly - 2 sets - 10 reps  ASSESSMENT:  CLINICAL IMPRESSION: Boyd Kerbs had a set back with the pneumonia but seems to be doing well now.  She fatigued slightly but completed all tasks with frequent rest breaks.   She would benefit skilled PT in order to improve mobility and strength needed for independent living.      OBJECTIVE IMPAIRMENTS: decreased activity tolerance, decreased balance, decreased mobility, difficulty walking, decreased ROM, decreased strength, impaired perceived functional ability, and pain.   ACTIVITY LIMITATIONS: sitting, sleeping, toileting, and locomotion level  PARTICIPATION LIMITATIONS: meal prep, cleaning, laundry, driving, and community activity  PERSONAL FACTORS: Time since onset of injury/illness/exacerbation and 1-2 comorbidities: kidney disease (unable to take some OTC meds for pain control) diabetes  are also affecting patient's functional outcome.   REHAB POTENTIAL: Good  CLINICAL DECISION MAKING: Stable/uncomplicated  EVALUATION COMPLEXITY: Low   GOALS: Goals reviewed with patient? Yes  SHORT TERM GOALS: Target date: 05/16/2023   The patient will demonstrate knowledge of basic self care strategies and exercises to promote healing  Baseline: Goal status: MET 05/08/23  2.  Improved LE strength and ease with sit to stand from lower toilet  with 5x STS test improved to 21 sec Baseline:  Goal status: met 3/26 3.  The patient will have improved Timed Up and Go (TUG) time  to   19    sec indicating improved gait speed and LE strength  Baseline:  Goal status: met 3/26  4.  The patient will report a 30% improvement in pain levels with functional activities which are currently difficult including walking, sitting in hard chairs and getting in and out of her [employer] Baseline:  Goal status: MET 05/08/23    LONG TERM GOALS: Target date:08/22/2023    The patient will be independent in a safe self progression of a home exercise program to promote further recovery of function  Baseline:  Goal status: INITIAL  2.  The patient will have improved right hip strength to at least 4+/5 needed for standing, walking longer distances and descending stairs at home and in the community  Baseline:  Goal status: INITIAL  3.  Improved bil LE strength needed to step on/off higher step for upcoming train travel Baseline:  Goal status: INITIAL  4.  The patient will have improved gait stamina and speed needed to ambulate 600 feet in 6 minutes  Baseline:  Goal status: INITIAL  5.  The patient will have improved FOTO score to    58%   indicating improved function with less pain  Baseline:  Goal status: INITIAL    PLAN:  PT FREQUENCY: 2x/week  PT DURATION: 8 weeks  PLANNED INTERVENTIONS: 97164- PT Re-evaluation, 97110-Therapeutic exercises, 97530- Therapeutic activity, 97112- Neuromuscular re-education, 97535- Self Care, 08657- Manual therapy, [id number]- Aquatic Therapy, 97014- Electrical stimulation (unattended), [id number]- Electrical stimulation (manual), 97035- Ultrasound, Patient/Family education, Balance training, Stair training, Taping, Dry Needling, Joint mobilization, Cryotherapy, and Moist heat  PLAN FOR NEXT SESSION: Continue slow progression due to recent bout of pneumonia; OK to try standing LE ex's ext visit;  Nustep,  right LE strengthening especially glutes; manual therapy: bent knee hip mobs; functional strength   Keshawn Fiorito B. Charlaine Utsey, PT 07/03/23 9:01  PM [facility] [address] [place], [place] [address] Phone # [phone] Fax [phone]

## 2023-07-05 ENCOUNTER — Ambulatory Visit

## 2023-07-06 ENCOUNTER — Telehealth: Payer: Self-pay

## 2023-07-06 ENCOUNTER — Ambulatory Visit (INDEPENDENT_AMBULATORY_CARE_PROVIDER_SITE_OTHER): Admitting: Vascular Surgery

## 2023-07-06 ENCOUNTER — Other Ambulatory Visit: Payer: Self-pay

## 2023-07-06 ENCOUNTER — Encounter: Payer: Self-pay | Admitting: Vascular Surgery

## 2023-07-06 VITALS — BP 141/80 | HR 70 | Temp 97.9°F | Resp 18 | Ht 62.0 in | Wt 143.8 lb

## 2023-07-06 DIAGNOSIS — I70221 Atherosclerosis of native arteries of extremities with rest pain, right leg: Secondary | ICD-10-CM

## 2023-07-06 NOTE — Telephone Encounter (Signed)
 Attempted to call for surgery scheduling. LVM

## 2023-07-06 NOTE — Progress Notes (Signed)
 Patient name: Regina [name] MRN: [id number] DOB: [birth year] Sex: female  REASON FOR CONSULT: PAD  HPI: Regina [name] is a 83 y.o. female, with history of hypertension, hyperlipidemia, CKD stage 3 that presents for evaluation of PAD.  She describes pain in both of her legs ongoing for the last 3 years.  This is now worse in the right leg.  She describes pain in the calf and foot that wakes her up at night.  No prior vascular interventions.  She is here with her husband.  They moved here from [place].  She did have ABIs on 06/22/2023 that were 0.6 on the left monophasic and 0.4 on the right monophasic.  Also had bilateral lower extremity arterial duplex showing 50 to 74% stenosis of the right common femoral artery with a right SFA occlusion and a right PT occlusion and also a high-grade 75 to 99% left popliteal stenosis with left PT occlusion.  Past Medical History:  Diagnosis Date   Anxiety    Arthritis    neck   Asthma    Cervical spondylolysis    Cervical spondylolysis    Depression    Hashimoto's disease    Hyperlipidemia    Hypertension    Insulin resistance    PONV (postoperative nausea and vomiting)    hasn't had surgery since 1993    Past Surgical History:  Procedure Laterality Date   ANTERIOR CERVICAL DECOMP/DISCECTOMY FUSION N/A 12/06/2017   Procedure: ANTERIOR CERVICAL DECOMPRESSION/DISCECTOMY FUSION, CERVICAL 3- CERVICAL 4, CERVICAL 4- CERVICAL 5, CERVICAL 5- CERVICAL 6;  Surgeon: Lisbeth Renshaw, MD;  Location: [facility] OR;  Service: Neurosurgery;  Laterality: N/A;  ANTERIOR CERVICAL DECOMPRESSION/DISCECTOMY FUSION, CERVICAL THREE- CERVICAL FOUR, CERVICAL FOUR- CERVICAL FIVE, CERVICAL FIVE- CERVICAL SIX   APPENDECTOMY     BREAST SURGERY     left biopsy   CESAREAN SECTION     x2   CHOLECYSTECTOMY     DENTAL SURGERY     teeth removal   OVARY SURGERY     ovarian wedge   TONSILLECTOMY      Family History  Problem Relation Age of Onset    COPD Mother    Asthma Father    Heart disease Father    Asthma Paternal Grandfather     SOCIAL HISTORY: Social History   Socioeconomic History   Marital status: Married    Spouse name: Not on file   Number of children: Not on file   Years of education: Not on file   Highest education level: Not on file  Occupational History   Not on file  Tobacco Use   Smoking status: Former    Current packs/day: 0.00    Types: Cigarettes    Quit date: 1982    Years since quitting: 43.2   Smokeless tobacco: Never   Tobacco comments:    Smoked 16-40ish, 1-2 ppd  Vaping Use   Vaping status: Never Used  Substance and Sexual Activity   Alcohol use: Yes    Comment: very rare   Drug use: Never   Sexual activity: Not on file  Other Topics Concern   Not on file  Social History Narrative   Right handed   One story home   Drinks cafffeine   Social Drivers of Health   Financial Resource Strain: Not on file  Food Insecurity: Not on file  Transportation Needs: Not on file  Physical Activity: Not on file  Stress: Not on file  Social Connections: Not on file  Intimate  Partner Violence: Not on file    Allergies  Allergen Reactions   Ceclor [Cefaclor] Rash   Naproxen Hives and Swelling    Mouth swelling   Sulfa Antibiotics Nausea And Vomiting and Other (See Comments)    Syncope/headaches    Amlodipine Cough   Augmentin [Amoxicillin-Pot Clavulanate] Nausea And Vomiting    Has patient had a PCN reaction causing immediate rash, facial/tongue/throat swelling, SOB or lightheadedness with hypotension: No Has patient had a PCN reaction causing severe rash involving mucus membranes or skin necrosis: No Has patient had a PCN reaction that required hospitalization: No Has patient had a PCN reaction occurring within the last 10 years: No If all of the above answers are "NO", then may proceed with Cephalosporin use.    Other Nausea And Vomiting    "pain medications" or opoids/Anesthesia    Valsartan Cough    Current Outpatient Medications  Medication Sig Dispense Refill   acetaminophen (TYLENOL) 325 MG tablet Take 1-2 tablets (325-650 mg total) by mouth every 4 (four) hours as needed for mild pain.     albuterol (PROVENTIL HFA;VENTOLIN HFA) 108 (90 Base) MCG/ACT inhaler Inhale 1-2 puffs into the lungs every 6 (six) hours as needed for wheezing or shortness of breath (asthma/coughing).     ALPRAZolam (XANAX) 0.25 MG tablet Take 1 tablet (0.25 mg total) by mouth 3 (three) times daily as needed for anxiety. 30 tablet 0   atorvastatin (LIPITOR) 20 MG tablet Take 20 mg by mouth daily.     azithromycin (ZITHROMAX) 250 MG tablet Take 1 tablet (250 mg total) by mouth daily. 4 tablet 0   budesonide-formoterol (SYMBICORT) 160-4.5 MCG/ACT inhaler Inhale 2 puffs into the lungs every 12 (twelve) hours. 1 Inhaler 0   escitalopram (LEXAPRO) 10 MG tablet Take 1 tablet (10 mg total) by mouth at bedtime. 30 tablet 0   ferrous sulfate 325 (65 FE) MG EC tablet Take 325 mg by mouth daily.     furosemide (LASIX) 20 MG tablet Take 20 mg by mouth daily.     levothyroxine (SYNTHROID) 75 MCG tablet Take 1 tablet (75 mcg total) by mouth daily before breakfast. 30 tablet 0   lisinopril (ZESTRIL) 10 MG tablet Take 10 mg by mouth daily.     metaxalone (SKELAXIN) 800 MG tablet Take 1 tablet (800 mg total) by mouth 3 (three) times daily as needed for muscle spasms. 60 tablet 0   metFORMIN (GLUCOPHAGE) 850 MG tablet Take 850 mg by mouth 2 (two) times daily.      montelukast (SINGULAIR) 10 MG tablet Take 1 tablet (10 mg total) by mouth at bedtime. 30 tablet 3   omeprazole (PRILOSEC) 40 MG capsule TAKE 1 CAPSULE BY MOUTH ONCE DAILY 30 MINUTES BEFORE MORNING MEAL     senna-docusate (SENOKOT-S) 8.6-50 MG tablet Take 1 tablet by mouth 2 (two) times daily.     triamterene-hydrochlorothiazide (MAXZIDE-25) 37.5-25 MG tablet      VIRTUSSIN A/C 100-10 MG/5ML syrup TAKE 5 ML BY MOUTH EVERY 6 HOURS AS NEEDED FOR COUGH FOR 5  DAYS     vitamin B-12 (CYANOCOBALAMIN) 100 MCG tablet Take 100 mcg by mouth daily.     Vitamin D, Ergocalciferol, (DRISDOL) 1.25 MG (50000 UT) CAPS capsule TAKE 1 CAPSULE BY MOUTH ONCE A WEEK FOR 8 WEEKS WHEN FINISHED CAN TAKE 1000 UNITS OVER THE COUNTER DAILY     No current facility-administered medications for this visit.    REVIEW OF SYSTEMS:  [X]  denotes positive finding, [ ]  denotes  negative finding Cardiac  Comments:  Chest pain or chest pressure:    Shortness of breath upon exertion:    Short of breath when lying flat:    Irregular heart rhythm:        Vascular    Pain in calf, thigh, or hip brought on by ambulation:    Pain in feet at night that wakes you up from your sleep:  x Right foot   Blood clot in your veins:    Leg swelling:         Pulmonary    Oxygen at home:    Productive cough:     Wheezing:         Neurologic    Sudden weakness in arms or legs:     Sudden numbness in arms or legs:     Sudden onset of difficulty speaking or slurred speech:    Temporary loss of vision in one eye:     Problems with dizziness:         Gastrointestinal    Blood in stool:     Vomited blood:         Genitourinary    Burning when urinating:     Blood in urine:        Psychiatric    Major depression:         Hematologic    Bleeding problems:    Problems with blood clotting too easily:        Skin    Rashes or ulcers:        Constitutional    Fever or chills:      PHYSICAL EXAM: Vitals:   07/06/23 1016  BP: (!) 141/80  Pulse: 70  Resp: 18  Temp: 97.9 F (36.6 C)  TempSrc: Temporal  SpO2: 97%  Weight: 143 lb 12.8 oz (65.2 kg)  Height: 5\' 2"  (1.575 m)    GENERAL: The patient is a well-nourished female, in no acute distress. The vital signs are documented above. CARDIAC: There is a regular rate and rhythm.  VASCULAR: No tissue loss No palpable right femoral pulse Weakly palpable left femoral pulse No palpable pedal pulses No tissue loss PULMONARY: No  respiratory distress. ABDOMEN: Soft and non-tender. MUSCULOSKELETAL: There are no major deformities or cyanosis. NEUROLOGIC: No focal weakness or paresthesias are detected. SKIN: There are no ulcers or rashes noted. PSYCHIATRIC: The patient has a normal affect.  DATA:   ABIs on 06/22/2023 that were 0.6 on the left monophasic and 0.4 on the right monophasic.    Bilateral lower extremity arterial duplex 06/22/23 showing 50 to 74% stenosis of the right common femoral artery with a right SFA occlusion and a right PT occlusion and also a high-grade 75 to 99% left popliteal stenosis with left PT occlusion.  Assessment/Plan:   83 y.o. female, with history of hypertension, hyperlipidemia, CKD stage 3 that presents for evaluation of PAD.  She describes pain in both of her legs ongoing for the last 3 years.  This is now worse in the right leg.  She describes pain in the calf and foot that wakes her up at night.  Discussed her symptoms are consistent with critical limb ischemia now with rest pain.  Her ABIs on the right were 0.4.  She does have a moderate right common femoral stenosis with an SFA occlusion.  I have recommended angiogram lower extremity arteriogram with a focus on the right leg from a left femoral approach.  Will also get left leg runoff  as she does have some symptoms in the left leg as well.  Will get this scheduled in the Cath Lab.  Discussed we will try and use CO2 to limit contrast but will require some limited contrast.  Will also give her sedation given she does have history of vasovagal events and gets very nervous during procedures.  Discussed with her common femoral disease on the right ultimately may require open surgical intervention with femoral endarterectomy and possible bypass.   Cephus Shelling, MD [facility] Office: [phone]

## 2023-07-11 ENCOUNTER — Ambulatory Visit: Admitting: Physical Therapy

## 2023-07-17 ENCOUNTER — Encounter: Admitting: Physical Therapy

## 2023-07-19 ENCOUNTER — Encounter (HOSPITAL_COMMUNITY)
Admission: RE | Disposition: A | Discharge status: Home / Self Care | Payer: Self-pay | Source: Home / Self Care | Attending: Vascular Surgery

## 2023-07-19 ENCOUNTER — Encounter: Admitting: Physical Therapy

## 2023-07-19 ENCOUNTER — Ambulatory Visit (HOSPITAL_COMMUNITY)
Admission: RE | Admit: 2023-07-19 | Discharge: 2023-07-19 | Disposition: A | Discharge status: Home / Self Care | Attending: Vascular Surgery | Admitting: Vascular Surgery

## 2023-07-19 ENCOUNTER — Other Ambulatory Visit: Payer: Self-pay

## 2023-07-19 DIAGNOSIS — I129 Hypertensive chronic kidney disease with stage 1 through stage 4 chronic kidney disease, or unspecified chronic kidney disease: Secondary | ICD-10-CM | POA: Insufficient documentation

## 2023-07-19 DIAGNOSIS — Z87891 Personal history of nicotine dependence: Secondary | ICD-10-CM | POA: Insufficient documentation

## 2023-07-19 DIAGNOSIS — Z8249 Family history of ischemic heart disease and other diseases of the circulatory system: Secondary | ICD-10-CM | POA: Insufficient documentation

## 2023-07-19 DIAGNOSIS — I70221 Atherosclerosis of native arteries of extremities with rest pain, right leg: Secondary | ICD-10-CM | POA: Diagnosis present

## 2023-07-19 DIAGNOSIS — N183 Chronic kidney disease, stage 3 unspecified: Secondary | ICD-10-CM | POA: Insufficient documentation

## 2023-07-19 HISTORY — PX: ABDOMINAL AORTOGRAM W/LOWER EXTREMITY: CATH118223

## 2023-07-19 HISTORY — PX: LOWER EXTREMITY ANGIOGRAPHY: CATH118251

## 2023-07-19 LAB — POCT I-STAT, CHEM 8
BUN: 50 mg/dL — ABNORMAL HIGH (ref 8–23)
Calcium, Ion: 1.21 mmol/L (ref 1.15–1.40)
Chloride: 107 mmol/L (ref 98–111)
Creatinine, Ser: 2.3 mg/dL — ABNORMAL HIGH (ref 0.44–1.00)
Glucose, Bld: 118 mg/dL — ABNORMAL HIGH (ref 70–99)
HCT: 44 % (ref 36.0–46.0)
Hemoglobin: 15 g/dL (ref 12.0–15.0)
Potassium: 5.2 mmol/L — ABNORMAL HIGH (ref 3.5–5.1)
Sodium: 137 mmol/L (ref 135–145)
TCO2: 23 mmol/L (ref 22–32)

## 2023-07-19 SURGERY — ABDOMINAL AORTOGRAM W/LOWER EXTREMITY
Anesthesia: LOCAL

## 2023-07-19 MED ORDER — HYDRALAZINE HCL 20 MG/ML IJ SOLN: 5.0000 mg | INTRAMUSCULAR | Status: DC | PRN

## 2023-07-19 MED ORDER — LIDOCAINE HCL (PF) 1 % IJ SOLN
INTRAMUSCULAR | Status: DC | PRN
  Administered 2023-07-19: 15 mL

## 2023-07-19 MED ORDER — MIDAZOLAM HCL 2 MG/2ML IJ SOLN
INTRAMUSCULAR | Status: DC | PRN
  Administered 2023-07-19: 1 mg via INTRAVENOUS

## 2023-07-19 MED ORDER — SODIUM CHLORIDE 0.9% FLUSH: 3.0000 mL | INTRAVENOUS | Status: DC | PRN

## 2023-07-19 MED ORDER — SODIUM CHLORIDE 0.9 % IV SOLN
INTRAVENOUS | Status: DC
Start: 2023-07-19 — End: 2023-07-19

## 2023-07-19 MED ORDER — IODIXANOL 320 MG/ML IV SOLN
INTRAVENOUS | Status: DC | PRN
  Administered 2023-07-19: 17 mL

## 2023-07-19 MED ORDER — ONDANSETRON HCL 4 MG/2ML IJ SOLN
INTRAMUSCULAR | Status: DC | PRN
Start: 2023-07-19 — End: 2023-07-19
  Administered 2023-07-19: 4 mg via INTRAVENOUS

## 2023-07-19 MED ORDER — ONDANSETRON HCL 4 MG/2ML IJ SOLN: 4.0000 mg | Freq: Four times a day (QID) | INTRAMUSCULAR | Status: DC | PRN

## 2023-07-19 MED ORDER — HEPARIN (PORCINE) IN NACL 2000-0.9 UNIT/L-% IV SOLN
INTRAVENOUS | Status: DC | PRN
  Administered 2023-07-19: 1000 mL

## 2023-07-19 MED ORDER — ONDANSETRON HCL 4 MG/2ML IJ SOLN
INTRAMUSCULAR | Status: AC
  Filled 2023-07-19: qty 2

## 2023-07-19 MED ORDER — SODIUM CHLORIDE 0.9 % IV SOLN: INTRAVENOUS | Status: DC

## 2023-07-19 MED ORDER — FENTANYL CITRATE (PF) 100 MCG/2ML IJ SOLN
INTRAMUSCULAR | Status: DC | PRN
  Administered 2023-07-19: 25 ug via INTRAVENOUS

## 2023-07-19 MED ORDER — ACETAMINOPHEN 325 MG PO TABS: 650.0000 mg | ORAL_TABLET | ORAL | Status: DC | PRN

## 2023-07-19 MED ORDER — FENTANYL CITRATE (PF) 100 MCG/2ML IJ SOLN
INTRAMUSCULAR | Status: AC
  Filled 2023-07-19: qty 2

## 2023-07-19 MED ORDER — LISINOPRIL 10 MG PO TABS: 10.0000 mg | ORAL_TABLET | Freq: Once | ORAL | Status: DC

## 2023-07-19 MED ORDER — MIDAZOLAM HCL 2 MG/2ML IJ SOLN
INTRAMUSCULAR | Status: AC
  Filled 2023-07-19: qty 2

## 2023-07-19 MED ORDER — LIDOCAINE HCL (PF) 1 % IJ SOLN
INTRAMUSCULAR | Status: AC
  Filled 2023-07-19: qty 30

## 2023-07-19 MED ORDER — SODIUM CHLORIDE 0.9 % IV SOLN: 250.0000 mL | INTRAVENOUS | Status: DC | PRN

## 2023-07-19 MED ORDER — SODIUM CHLORIDE 0.9% FLUSH: 3.0000 mL | Freq: Two times a day (BID) | INTRAVENOUS | Status: DC

## 2023-07-19 MED ORDER — LABETALOL HCL 5 MG/ML IV SOLN: 10.0000 mg | INTRAVENOUS | Status: DC | PRN

## 2023-07-19 SURGICAL SUPPLY — 14 items
CATH OMNI FLUSH 5F 65CM (CATHETERS) IMPLANT
COVER DOME SNAP 22 D (MISCELLANEOUS) IMPLANT
GUIDEWIRE ANGLED .035X150CM (WIRE) IMPLANT
KIT ANGIASSIST CO2 SYSTEM (KITS) IMPLANT
KIT MICROPUNCTURE NIT STIFF (SHEATH) IMPLANT
KIT PV (KITS) ×1 IMPLANT
KIT SYRINGE INJ CVI SPIKEX1 (MISCELLANEOUS) IMPLANT
SET ATX-X65L (MISCELLANEOUS) IMPLANT
SHEATH PINNACLE 5F 10CM (SHEATH) IMPLANT
SHEATH PROBE COVER 6X72 (BAG) IMPLANT
SYR MEDRAD MARK 7 150ML (SYRINGE) ×1 IMPLANT
TRANSDUCER W/STOPCOCK (MISCELLANEOUS) ×1 IMPLANT
TRAY PV CATH (CUSTOM PROCEDURE TRAY) ×1 IMPLANT
WIRE BENTSON .035X145CM (WIRE) IMPLANT

## 2023-07-19 NOTE — Progress Notes (Signed)
Pt ambulated to and from bathroom to void with no signs of oozing from left groin site  

## 2023-07-19 NOTE — Op Note (Signed)
    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  Ultrasound-guided access left common femoral artery 2.  CO2 aortogram with catheter selection of aorta 3.  Right lower extremity arteriogram with catheter selection of the external iliac artery 4.  Left lower extremity arteriogram with runoff from the left femoral sheath 5.  38 minutes of monitored moderate conscious sedation time  Indications: Patient is 83 year old female seen with critical limb ischemia with rest pain in the right lower extremity.  Duplex did show a moderate right common femoral stenosis with SFA occlusion.  She presents for lower extremity arteriogram with possible intervention with initial focus on the right leg.  We will also get left leg images as she has some complaints in the left leg as well.  Risk benefits discussed.  Findings:   Ultrasound-guided access left common femoral artery.  CO2 aortogram showed patent infrarenal aorta with patent iliac arteries bilaterally.  The iliacs were calcified and small but no obvious flow-limiting stenosis.  On the right she had a high-grade greater than 80% proximal common femoral stenosis with a patent profunda and flush SFA occlusion.  She reconstitutes above-knee popliteal artery on the right with patent below-knee popliteal artery and single-vessel runoff in the anterior tibial.  On the left she has patent common femoral.  Diffusely diseased SFA with distal high-grade stenosis.  There is a second high-grade stenosis in the above-knee popliteal artery.  Two vessel runoff in the anterior tibial and peroneal.   Procedure:  The patient was identified in the holding area and taken to room 8.  The patient was then placed supine on the table and prepped  and draped in the usual sterile fashion.  A time out was called.  The patient received Versed and fentanyl for conscious moderate sedation.  Vital signs were monitored including heart rate, respiratory rate, oxygenation and blood pressure.  I was present for all of moderate sedation.  Ultrasound was used to evaluate the left common femoral artery.  It was patent .  A digital ultrasound image was acquired.  A micropuncture needle was used to access the left common femoral artery under ultrasound guidance.  An 018 wire was advanced without resistance and a micropuncture sheath was placed.  The 018 wire was removed and a benson wire was placed.  The micropuncture sheath was exchanged for a 5 french sheath.  An omniflush catheter was advanced over the wire to the level of L-1.  An abdominal angiogram was obtained.  Next, using the omniflush catheter and a glide wire, the aortic bifurcation was crossed and the catheter was placed into theright external iliac artery and right runoff was obtained.  We tried to do this with CO2 but had to use some limited contrast to get better imaging of the runoff.  Ultimately I removed wires and catheters.  We then got left leg runoff with CO2.  Our focus today was on the right leg and she does not have any endovascular options.  Will need open surgical intervention.  Taken to holding for sheath removal.  Plan: Patient will need right common femoral endarterectomy with possible femoral to above-knee popliteal bypass.   Cephus Shelling, MD [facility] Office: [phone]

## 2023-07-19 NOTE — Progress Notes (Signed)
 Site area: L Groin (62fr arterial) Site Prior to Removal:  Level 0 Pressure Applied For: 25 min Manual:   Yes Patient Status During Pull:  Stable Post Pull Site:  Level 0 Post Pull Instructions Given:  Yes Post Pull Pulses Present: Yes, doppler L DP and PT Dressing Applied: gauze & tegaderm  Bedrest begins @ 1115 Comments: Starry, RN pulled sheathe under observation.  Pt reports feeling well, denies pain in the site.  After manual pressure, bleeding controlled, no oozing, site soft, nontender, dressing applied.  Pt educated on post pull instruction, verbalized understanding, no questions at this time.  Will continue to monitor.

## 2023-07-19 NOTE — H&P (Signed)
 History and Physical Interval Note:  07/19/2023 8:16 AM  Regina [name]  has presented today for surgery, with the diagnosis of ischemica bilateral lower extremity.  The various methods of treatment have been discussed with the patient and family. After consideration of risks, benefits and other options for treatment, the patient has consented to  Procedure(s): ABDOMINAL AORTOGRAM W/LOWER EXTREMITY (N/A) Lower Extremity Angiography (N/A) LOWER EXTREMITY INTERVENTION (N/A) as a surgical intervention.  The patient's history has been reviewed, patient examined, no change in status, stable for surgery.  I have reviewed the patient's chart and labs.  Questions were answered to the patient's satisfaction.     Cephus Shelling     Patient name: Regina [name] MRN: [id number]        DOB: December 08, 1940          Sex: female   REASON FOR CONSULT: PAD   HPI: Regina [name] is a 83 y.o. female, with history of hypertension, hyperlipidemia, CKD stage 3 that presents for evaluation of PAD.  She describes pain in both of her legs ongoing for the last 3 years.  This is now worse in the right leg.  She describes pain in the calf and foot that wakes her up at night.  No prior vascular interventions.  She is here with her husband.  They moved here from [place].   She did have ABIs on 06/22/2023 that were 0.6 on the left monophasic and 0.4 on the right monophasic.  Also had bilateral lower extremity arterial duplex showing 50 to 74% stenosis of the right common femoral artery with a right SFA occlusion and a right PT occlusion and also a high-grade 75 to 99% left popliteal stenosis with left PT occlusion.       Past Medical History:  Diagnosis Date   Anxiety     Arthritis      neck   Asthma     Cervical spondylolysis     Cervical spondylolysis     Depression     Hashimoto's disease     Hyperlipidemia     Hypertension     Insulin resistance     PONV (postoperative  nausea and vomiting)      hasn't had surgery since 1993               Past Surgical History:  Procedure Laterality Date   ANTERIOR CERVICAL DECOMP/DISCECTOMY FUSION N/A 12/06/2017    Procedure: ANTERIOR CERVICAL DECOMPRESSION/DISCECTOMY FUSION, CERVICAL 3- CERVICAL 4, CERVICAL 4- CERVICAL 5, CERVICAL 5- CERVICAL 6;  Surgeon: Lisbeth Renshaw, MD;  Location: [facility] OR;  Service: Neurosurgery;  Laterality: N/A;  ANTERIOR CERVICAL DECOMPRESSION/DISCECTOMY FUSION, CERVICAL THREE- CERVICAL FOUR, CERVICAL FOUR- CERVICAL FIVE, CERVICAL FIVE- CERVICAL SIX   APPENDECTOMY       BREAST SURGERY        left biopsy   CESAREAN SECTION        x2   CHOLECYSTECTOMY       DENTAL SURGERY        teeth removal   OVARY SURGERY        ovarian wedge   TONSILLECTOMY                   Family History  Problem Relation Age of Onset   COPD Mother     Asthma Father     Heart disease Father     Asthma Paternal Grandfather            SOCIAL HISTORY: Social History  Socioeconomic History   Marital status: Married      Spouse name: Not on file   Number of children: Not on file   Years of education: Not on file   Highest education level: Not on file  Occupational History   Not on file  Tobacco Use   Smoking status: Former      Current packs/day: 0.00      Types: Cigarettes      Quit date: 62      Years since quitting: 43.2   Smokeless tobacco: Never   Tobacco comments:      Smoked [age]ish, 1-2 ppd  Vaping Use   Vaping status: Never Used  Substance and Sexual Activity   Alcohol use: Yes      Comment: very rare   Drug use: Never   Sexual activity: Not on file  Other Topics Concern   Not on file  Social History Narrative    Right handed    One story home    Drinks cafffeine    Social Drivers of Health    Financial Resource Strain: Not on file  Food Insecurity: Not on file  Transportation Needs: Not on file  Physical Activity: Not on file  Stress: Not on file  Social  Connections: Not on file  Intimate Partner Violence: Not on file      Allergies       Allergies  Allergen Reactions   Ceclor [Cefaclor] Rash   Naproxen Hives and Swelling      Mouth swelling   Sulfa Antibiotics Nausea And Vomiting and Other (See Comments)      Syncope/headaches    Amlodipine Cough   Augmentin [Amoxicillin-Pot Clavulanate] Nausea And Vomiting      Has patient had a PCN reaction causing immediate rash, facial/tongue/throat swelling, SOB or lightheadedness with hypotension: No Has patient had a PCN reaction causing severe rash involving mucus membranes or skin necrosis: No Has patient had a PCN reaction that required hospitalization: No Has patient had a PCN reaction occurring within the last 10 years: No If all of the above answers are "NO", then may proceed with Cephalosporin use.     Other Nausea And Vomiting      "pain medications" or opoids/Anesthesia   Valsartan Cough              Current Outpatient Medications  Medication Sig Dispense Refill   acetaminophen (TYLENOL) 325 MG tablet Take 1-2 tablets (325-650 mg total) by mouth every 4 (four) hours as needed for mild pain.       albuterol (PROVENTIL HFA;VENTOLIN HFA) 108 (90 Base) MCG/ACT inhaler Inhale 1-2 puffs into the lungs every 6 (six) hours as needed for wheezing or shortness of breath (asthma/coughing).       ALPRAZolam (XANAX) 0.25 MG tablet Take 1 tablet (0.25 mg total) by mouth 3 (three) times daily as needed for anxiety. 30 tablet 0   atorvastatin (LIPITOR) 20 MG tablet Take 20 mg by mouth daily.       azithromycin (ZITHROMAX) 250 MG tablet Take 1 tablet (250 mg total) by mouth daily. 4 tablet 0   budesonide-formoterol (SYMBICORT) 160-4.5 MCG/ACT inhaler Inhale 2 puffs into the lungs every 12 (twelve) hours. 1 Inhaler 0   escitalopram (LEXAPRO) 10 MG tablet Take 1 tablet (10 mg total) by mouth at bedtime. 30 tablet 0   ferrous sulfate 325 (65 FE) MG EC tablet Take 325 mg by mouth daily.        furosemide (LASIX) 20 MG  tablet Take 20 mg by mouth daily.       levothyroxine (SYNTHROID) 75 MCG tablet Take 1 tablet (75 mcg total) by mouth daily before breakfast. 30 tablet 0   lisinopril (ZESTRIL) 10 MG tablet Take 10 mg by mouth daily.       metaxalone (SKELAXIN) 800 MG tablet Take 1 tablet (800 mg total) by mouth 3 (three) times daily as needed for muscle spasms. 60 tablet 0   metFORMIN (GLUCOPHAGE) 850 MG tablet Take 850 mg by mouth 2 (two) times daily.        montelukast (SINGULAIR) 10 MG tablet Take 1 tablet (10 mg total) by mouth at bedtime. 30 tablet 3   omeprazole (PRILOSEC) 40 MG capsule TAKE 1 CAPSULE BY MOUTH ONCE DAILY 30 MINUTES BEFORE MORNING MEAL       senna-docusate (SENOKOT-S) 8.6-50 MG tablet Take 1 tablet by mouth 2 (two) times daily.       triamterene-hydrochlorothiazide (MAXZIDE-25) 37.5-25 MG tablet         VIRTUSSIN A/C 100-10 MG/5ML syrup TAKE 5 ML BY MOUTH EVERY 6 HOURS AS NEEDED FOR COUGH FOR 5 DAYS       vitamin B-12 (CYANOCOBALAMIN) 100 MCG tablet Take 100 mcg by mouth daily.       Vitamin D, Ergocalciferol, (DRISDOL) 1.25 MG (50000 UT) CAPS capsule TAKE 1 CAPSULE BY MOUTH ONCE A WEEK FOR 8 WEEKS WHEN FINISHED CAN TAKE 1000 UNITS OVER THE COUNTER DAILY          No current facility-administered medications for this visit.        REVIEW OF SYSTEMS:  [X]  denotes positive finding, [ ]  denotes negative finding Cardiac   Comments:  Chest pain or chest pressure:      Shortness of breath upon exertion:      Short of breath when lying flat:      Irregular heart rhythm:             Vascular      Pain in calf, thigh, or hip brought on by ambulation:      Pain in feet at night that wakes you up from your sleep:  x Right foot   Blood clot in your veins:      Leg swelling:              Pulmonary      Oxygen at home:      Productive cough:       Wheezing:              Neurologic      Sudden weakness in arms or legs:       Sudden numbness in arms or legs:        Sudden onset of difficulty speaking or slurred speech:      Temporary loss of vision in one eye:       Problems with dizziness:              Gastrointestinal      Blood in stool:       Vomited blood:              Genitourinary      Burning when urinating:       Blood in urine:             Psychiatric      Major depression:              Hematologic      Bleeding problems:  Problems with blood clotting too easily:             Skin      Rashes or ulcers:             Constitutional      Fever or chills:          PHYSICAL EXAM:    Vitals:    07/06/23 1016  BP: (!) 141/80  Pulse: 70  Resp: 18  Temp: 97.9 F (36.6 C)  TempSrc: Temporal  SpO2: 97%  Weight: 143 lb 12.8 oz (65.2 kg)  Height: 5\' 2"  (1.575 m)      GENERAL: The patient is a well-nourished female, in no acute distress. The vital signs are documented above. CARDIAC: There is a regular rate and rhythm.  VASCULAR: No tissue loss No palpable right femoral pulse Weakly palpable left femoral pulse No palpable pedal pulses No tissue loss PULMONARY: No respiratory distress. ABDOMEN: Soft and non-tender. MUSCULOSKELETAL: There are no major deformities or cyanosis. NEUROLOGIC: No focal weakness or paresthesias are detected. SKIN: There are no ulcers or rashes noted. PSYCHIATRIC: The patient has a normal affect.   DATA:    ABIs on 06/22/2023 that were 0.6 on the left monophasic and 0.4 on the right monophasic.     Bilateral lower extremity arterial duplex 06/22/23 showing 50 to 74% stenosis of the right common femoral artery with a right SFA occlusion and a right PT occlusion and also a high-grade 75 to 99% left popliteal stenosis with left PT occlusion.   Assessment/Plan:    83 y.o. female, with history of hypertension, hyperlipidemia, CKD stage 3 that presents for evaluation of PAD.  She describes pain in both of her legs ongoing for the last 3 years.  This is now worse in the right leg.  She describes  pain in the calf and foot that wakes her up at night.  Discussed her symptoms are consistent with critical limb ischemia now with rest pain.  Her ABIs on the right were 0.4.  She does have a moderate right common femoral stenosis with an SFA occlusion.  I have recommended angiogram lower extremity arteriogram with a focus on the right leg from a left femoral approach.  Will also get left leg runoff as she does have some symptoms in the left leg as well.  Will get this scheduled in the Cath Lab.  Discussed we will try and use CO2 to limit contrast but will require some limited contrast.  Will also give her sedation given she does have history of vasovagal events and gets very nervous during procedures.  Discussed with her common femoral disease on the right ultimately may require open surgical intervention with femoral endarterectomy and possible bypass.     Young Hensen, MD [facility] Office: [phone]

## 2023-07-20 ENCOUNTER — Telehealth: Payer: Self-pay

## 2023-07-20 ENCOUNTER — Other Ambulatory Visit: Payer: Self-pay

## 2023-07-20 ENCOUNTER — Encounter (HOSPITAL_COMMUNITY): Payer: Self-pay | Admitting: Vascular Surgery

## 2023-07-20 DIAGNOSIS — T82898A Other specified complication of vascular prosthetic devices, implants and grafts, initial encounter: Secondary | ICD-10-CM

## 2023-07-20 DIAGNOSIS — I70221 Atherosclerosis of native arteries of extremities with rest pain, right leg: Secondary | ICD-10-CM

## 2023-07-20 NOTE — Telephone Encounter (Signed)
 Patient wants to discuss with daughter prior to scheduling.  Will call when ready to scheduled surgery.

## 2023-07-23 ENCOUNTER — Encounter: Admitting: Physical Therapy

## 2023-07-25 ENCOUNTER — Encounter: Admitting: Physical Therapy

## 2023-07-26 NOTE — Progress Notes (Signed)
 Surgical Instructions   Your procedure is scheduled on Monday Aug 06, 2023. Report to [facility] Main Entrance "A" at 7:30 A.M., then check in with the Admitting office. Any questions or running late day of surgery: call [phone]  Questions prior to your surgery date: call [phone], Monday-Friday, 8am-4pm. If you experience any cold or flu symptoms such as cough, fever, chills, shortness of breath, etc. between now and your scheduled surgery, please notify us  at the above number.     Remember:  Do not eat or drink after midnight the night before your surgery   Take these medicines the morning of surgery with A SIP OF WATER  atorvastatin (LIPITOR)  gabapentin (NEURONTIN)  levothyroxine  (SYNTHROID )   May take these medicines IF NEEDED: acetaminophen  (TYLENOL )  albuterol  (PROVENTIL  HFA;VENTOLIN  HFA) 108 (90 Base) MCG/ACT inhaler. Please bring with you to the hospital.   ALPRAZolam  (XANAX )  fluticasone  (FLONASE )  metaxalone  (SKELAXIN )  omeprazole (PRILOSEC)   DO NOT TAKE YOUR JARDIANCE THE MORNING OF SURGERY.     One week prior to surgery, STOP taking any Aspirin  (unless otherwise instructed by your surgeon) Aleve, Naproxen, Ibuprofen, Motrin, Advil, Goody's, BC's, all herbal medications, fish oil, and non-prescription vitamins.                     Do NOT Smoke (Tobacco/Vaping) for 24 hours prior to your procedure.  If you use a CPAP at night, you may bring your mask/headgear for your overnight stay.   You will be asked to remove any contacts, glasses, piercing's, hearing aid's, dentures/partials prior to surgery. Please bring cases for these items if needed.    Patients discharged the day of surgery will not be allowed to drive home, and someone needs to stay with them for 24 hours.  SURGICAL WAITING ROOM VISITATION Patients may have no more than 2 support people in the waiting area - these visitors may rotate.   Pre-op nurse will coordinate an appropriate time for 1  ADULT support person, who may not rotate, to accompany patient in pre-op.  Children under the age of 75 must have an adult with them who is not the patient and must remain in the main waiting area with an adult.  If the patient needs to stay at the hospital during part of their recovery, the visitor guidelines for inpatient rooms apply.  Please refer to the [facility] website for the visitor guidelines for any additional information.   If you received a COVID test during your pre-op visit  it is requested that you wear a mask when out in public, stay away from anyone that may not be feeling well and notify your surgeon if you develop symptoms. If you have been in contact with anyone that has tested positive in the last 10 days please notify you surgeon.      Pre-operative CHG Bathing Instructions   You can play a key role in reducing the risk of infection after surgery. Your skin needs to be as free of germs as possible. You can reduce the number of germs on your skin by washing with CHG (chlorhexidine  gluconate) soap before surgery. CHG is an antiseptic soap that kills germs and continues to kill germs even after washing.   DO NOT use if you have an allergy to chlorhexidine /CHG or antibacterial soaps. If your skin becomes reddened or irritated, stop using the CHG and notify one of our RNs at [phone].  TAKE A SHOWER THE NIGHT BEFORE SURGERY AND THE DAY OF SURGERY    Please keep in mind the following:  DO NOT shave, including legs and underarms, 48 hours prior to surgery.   You may shave your face before/day of surgery.  Place clean sheets on your bed the night before surgery Use a clean washcloth (not used since being washed) for each shower. DO NOT sleep with pet's night before surgery.  CHG Shower Instructions:  Wash your face and private area with normal soap. If you choose to wash your hair, wash first with your normal shampoo.  After you use shampoo/soap, rinse  your hair and body thoroughly to remove shampoo/soap residue.  Turn the water OFF and apply half the bottle of CHG soap to a CLEAN washcloth.  Apply CHG soap ONLY FROM YOUR NECK DOWN TO YOUR TOES (washing for 3-5 minutes)  DO NOT use CHG soap on face, private areas, open wounds, or sores.  Pay special attention to the area where your surgery is being performed.  If you are having back surgery, having someone wash your back for you may be helpful. Wait 2 minutes after CHG soap is applied, then you may rinse off the CHG soap.  Pat dry with a clean towel  Put on clean pajamas    Additional instructions for the day of surgery: DO NOT APPLY any lotions, deodorants, cologne, or perfumes.   Do not wear jewelry or makeup Do not wear nail polish, gel polish, artificial nails, or any other type of covering on natural nails (fingers and toes) Do not bring valuables to the hospital. [facility] is not responsible for valuables/personal belongings. Put on clean/comfortable clothes.  Please brush your teeth.  Ask your nurse before applying any prescription medications to the skin.

## 2023-07-27 ENCOUNTER — Other Ambulatory Visit: Payer: Self-pay

## 2023-07-27 ENCOUNTER — Encounter (HOSPITAL_COMMUNITY): Payer: Self-pay

## 2023-07-27 ENCOUNTER — Encounter (HOSPITAL_COMMUNITY)
Admission: RE | Admit: 2023-07-27 | Discharge: 2023-07-27 | Disposition: A | Discharge status: Home / Self Care | Source: Ambulatory Visit | Attending: Vascular Surgery | Admitting: Vascular Surgery

## 2023-07-27 VITALS — BP 144/64 | HR 64 | Temp 98.4°F | Resp 18 | Ht 62.0 in | Wt 148.9 lb

## 2023-07-27 DIAGNOSIS — Z79899 Other long term (current) drug therapy: Secondary | ICD-10-CM | POA: Insufficient documentation

## 2023-07-27 DIAGNOSIS — I70221 Atherosclerosis of native arteries of extremities with rest pain, right leg: Secondary | ICD-10-CM | POA: Diagnosis not present

## 2023-07-27 DIAGNOSIS — Z01812 Encounter for preprocedural laboratory examination: Secondary | ICD-10-CM | POA: Diagnosis present

## 2023-07-27 DIAGNOSIS — Z87891 Personal history of nicotine dependence: Secondary | ICD-10-CM | POA: Diagnosis not present

## 2023-07-27 DIAGNOSIS — I129 Hypertensive chronic kidney disease with stage 1 through stage 4 chronic kidney disease, or unspecified chronic kidney disease: Secondary | ICD-10-CM | POA: Insufficient documentation

## 2023-07-27 DIAGNOSIS — N1832 Chronic kidney disease, stage 3b: Secondary | ICD-10-CM | POA: Insufficient documentation

## 2023-07-27 DIAGNOSIS — I7300 Raynaud's syndrome without gangrene: Secondary | ICD-10-CM | POA: Diagnosis not present

## 2023-07-27 DIAGNOSIS — I6782 Cerebral ischemia: Secondary | ICD-10-CM | POA: Insufficient documentation

## 2023-07-27 DIAGNOSIS — D751 Secondary polycythemia: Secondary | ICD-10-CM | POA: Diagnosis not present

## 2023-07-27 DIAGNOSIS — E063 Autoimmune thyroiditis: Secondary | ICD-10-CM | POA: Diagnosis not present

## 2023-07-27 DIAGNOSIS — E785 Hyperlipidemia, unspecified: Secondary | ICD-10-CM | POA: Diagnosis not present

## 2023-07-27 DIAGNOSIS — I081 Rheumatic disorders of both mitral and tricuspid valves: Secondary | ICD-10-CM | POA: Insufficient documentation

## 2023-07-27 DIAGNOSIS — R55 Syncope and collapse: Secondary | ICD-10-CM | POA: Insufficient documentation

## 2023-07-27 DIAGNOSIS — J45909 Unspecified asthma, uncomplicated: Secondary | ICD-10-CM | POA: Diagnosis not present

## 2023-07-27 DIAGNOSIS — M1990 Unspecified osteoarthritis, unspecified site: Secondary | ICD-10-CM | POA: Diagnosis not present

## 2023-07-27 DIAGNOSIS — Z01818 Encounter for other preprocedural examination: Secondary | ICD-10-CM

## 2023-07-27 DIAGNOSIS — E88819 Insulin resistance, unspecified: Secondary | ICD-10-CM | POA: Diagnosis not present

## 2023-07-27 DIAGNOSIS — Z981 Arthrodesis status: Secondary | ICD-10-CM | POA: Insufficient documentation

## 2023-07-27 DIAGNOSIS — T82898A Other specified complication of vascular prosthetic devices, implants and grafts, initial encounter: Secondary | ICD-10-CM

## 2023-07-27 HISTORY — DX: Chronic kidney disease, unspecified: N18.9

## 2023-07-27 HISTORY — DX: Peripheral vascular disease, unspecified: I73.9

## 2023-07-27 HISTORY — DX: Syncope and collapse: R55

## 2023-07-27 HISTORY — DX: Secondary polycythemia: D75.1

## 2023-07-27 LAB — COMPREHENSIVE METABOLIC PANEL WITH GFR
ALT: 9 U/L (ref 0–44)
AST: 13 U/L — ABNORMAL LOW (ref 15–41)
Albumin: 3.3 g/dL — ABNORMAL LOW (ref 3.5–5.0)
Alkaline Phosphatase: 49 U/L (ref 38–126)
Anion gap: 10 (ref 5–15)
BUN: 33 mg/dL — ABNORMAL HIGH (ref 8–23)
CO2: 24 mmol/L (ref 22–32)
Calcium: 9.1 mg/dL (ref 8.9–10.3)
Chloride: 101 mmol/L (ref 98–111)
Creatinine, Ser: 2.05 mg/dL — ABNORMAL HIGH (ref 0.44–1.00)
GFR, Estimated: 24 mL/min — ABNORMAL LOW (ref >60)
Glucose, Bld: 99 mg/dL (ref 70–99)
Potassium: 5 mmol/L (ref 3.5–5.1)
Sodium: 135 mmol/L (ref 135–145)
Total Bilirubin: 0.8 mg/dL (ref 0.0–1.2)
Total Protein: 6.7 g/dL (ref 6.5–8.1)

## 2023-07-27 LAB — TYPE AND SCREEN
ABO/RH(D): O POS
Antibody Screen: NEGATIVE

## 2023-07-27 LAB — PROTIME-INR
INR: 1 (ref 0.8–1.2)
Prothrombin Time: 13.5 s (ref 11.4–15.2)

## 2023-07-27 LAB — APTT: aPTT: 28 s (ref 24–36)

## 2023-07-27 LAB — CBC
HCT: 40.8 % (ref 36.0–46.0)
Hemoglobin: 13 g/dL (ref 12.0–15.0)
MCH: 30.7 pg (ref 26.0–34.0)
MCHC: 31.9 g/dL (ref 30.0–36.0)
MCV: 96.2 fL (ref 80.0–100.0)
Platelets: 256 10*3/uL (ref 150–400)
RBC: 4.24 MIL/uL (ref 3.87–5.11)
RDW: 14.4 % (ref 11.5–15.5)
WBC: 9.2 10*3/uL (ref 4.0–10.5)
nRBC: 0 % (ref 0.0–0.2)

## 2023-07-27 LAB — URINALYSIS, ROUTINE W REFLEX MICROSCOPIC
Bilirubin Urine: NEGATIVE
Glucose, UA: 50 mg/dL — AB
Hgb urine dipstick: NEGATIVE
Ketones, ur: NEGATIVE mg/dL
Leukocytes,Ua: NEGATIVE
Nitrite: NEGATIVE
Protein, ur: NEGATIVE mg/dL
Specific Gravity, Urine: 1.006 (ref 1.005–1.030)
pH: 5 (ref 5.0–8.0)

## 2023-07-27 NOTE — Progress Notes (Addendum)
 Surgical Instructions     Your procedure is scheduled on Monday Aug 06, 2023. Report to [facility] Main Entrance "A" at 7:30 A.M., then check in with the Admitting office. Any questions or running late day of surgery: call [phone]   Questions prior to your surgery date: call [phone], Monday-Friday, 8am-4pm. If you experience any cold or flu symptoms such as cough, fever, chills, shortness of breath, etc. between now and your scheduled surgery, please notify us  at the above number.            Remember:       Do not eat or drink after midnight the night before your surgery        Take these medicines the morning of surgery with A SIP OF WATER  Aspirin  81 mg atorvastatin (LIPITOR)  gabapentin (NEURONTIN)  levothyroxine  (SYNTHROID )    May take these medicines IF NEEDED: acetaminophen  (TYLENOL )  albuterol  (PROVENTIL  HFA;VENTOLIN  HFA) 108 (90 Base) MCG/ACT inhaler. Please bring with you to the hospital.   ALPRAZolam  (XANAX )  fluticasone  (FLONASE )  metaxalone  (SKELAXIN )  omeprazole (PRILOSEC)    HOLD YOUR JARDIANCE for 1 day prior to surgery.  Last dose should be taken on Saturday, May 4th.      One week prior to surgery, STOP taking any Aleve, Naproxen, Ibuprofen, Motrin, Advil, Goody's, BC's, all herbal medications, fish oil, and non-prescription vitamins.                     Do NOT Smoke (Tobacco/Vaping) for 24 hours prior to your procedure.   If you use a CPAP at night, you may bring your mask/headgear for your overnight stay.   You will be asked to remove any contacts, glasses, piercing's, hearing aid's, dentures/partials prior to surgery. Please bring cases for these items if needed.    Patients discharged the day of surgery will not be allowed to drive home, and someone needs to stay with them for 24 hours.   SURGICAL WAITING ROOM VISITATION Patients may have no more than 2 support people in the waiting area - these visitors may rotate.   Pre-op nurse will  coordinate an appropriate time for 1 ADULT support person, who may not rotate, to accompany patient in pre-op.  Children under the age of 25 must have an adult with them who is not the patient and must remain in the main waiting area with an adult.   If the patient needs to stay at the hospital during part of their recovery, the visitor guidelines for inpatient rooms apply.   Please refer to the [facility] website for the visitor guidelines for any additional information.     If you received a COVID test during your pre-op visit  it is requested that you wear a mask when out in public, stay away from anyone that may not be feeling well and notify your surgeon if you develop symptoms. If you have been in contact with anyone that has tested positive in the last 10 days please notify you surgeon.         Pre-operative CHG Bathing Instructions    You can play a key role in reducing the risk of infection after surgery. Your skin needs to be as free of germs as possible. You can reduce the number of germs on your skin by washing with CHG (chlorhexidine  gluconate) soap before surgery. CHG is an antiseptic soap that kills germs and continues to kill germs even after washing.    DO NOT  use if you have an allergy to chlorhexidine /CHG or antibacterial soaps. If your skin becomes reddened or irritated, stop using the CHG and notify one of our RNs at [phone].               TAKE A SHOWER THE NIGHT BEFORE SURGERY AND THE DAY OF SURGERY     Please keep in mind the following:  DO NOT shave, including legs and underarms, 48 hours prior to surgery.   You may shave your face before/day of surgery.  Place clean sheets on your bed the night before surgery Use a clean washcloth (not used since being washed) for each shower. DO NOT sleep with pet's night before surgery.   CHG Shower Instructions:  Wash your face and private area with normal soap. If you choose to wash your hair, wash first with your  normal shampoo.  After you use shampoo/soap, rinse your hair and body thoroughly to remove shampoo/soap residue.  Turn the water OFF and apply half the bottle of CHG soap to a CLEAN washcloth.  Apply CHG soap ONLY FROM YOUR NECK DOWN TO YOUR TOES (washing for 3-5 minutes)  DO NOT use CHG soap on face, private areas, open wounds, or sores.  Pay special attention to the area where your surgery is being performed.  If you are having back surgery, having someone wash your back for you may be helpful. Wait 2 minutes after CHG soap is applied, then you may rinse off the CHG soap.  Pat dry with a clean towel  Put on clean pajamas     Additional instructions for the day of surgery: DO NOT APPLY any lotions, deodorants, cologne, or perfumes.   Do not wear jewelry or makeup Do not wear nail polish, gel polish, artificial nails, or any other type of covering on natural nails (fingers and toes) Do not bring valuables to the hospital. [facility] is not responsible for valuables/personal belongings. Put on clean/comfortable clothes.  Please brush your teeth.  Ask your nurse before applying any prescription medications to the skin.

## 2023-07-27 NOTE — Progress Notes (Signed)
 PCP - Dr. Marleta Simmer  Cardiologist - Denies  PPM/ICD - Denies Device Orders - n/a Rep Notified - n/a  Chest x-ray - 06-14-23 EKG - 05-28-23 Stress Test - Denies ECHO - 12-02-17 Cardiac Cath - 07-19-23  Sleep Study - Denies CPAP - n/a  Non-diabetic  Last dose of GLP1 agonist-  Jardiance GLP1 instructions: Hold for one day prior to surgery.  Last dose taken on 08-04-23 Saturday.  Blood Thinner Instructions: Denies Aspirin  Instructions: continue taking  ERAS Protcol - NPO PRE-SURGERY Ensure or G2- n/a  COVID TEST- n/a   Anesthesia review: Yes, HTN  Patient denies shortness of breath, fever, cough and chest pain at PAT appointment. Patient denies any respiratory issues at this time.    All instructions explained to the patient, with a verbal understanding of the material. Patient agrees to go over the instructions while at home for a better understanding. Patient also instructed to self quarantine after being tested for COVID-19. The opportunity to ask questions was provided.

## 2023-07-30 ENCOUNTER — Encounter: Admitting: Physical Therapy

## 2023-07-30 NOTE — Progress Notes (Signed)
 Anesthesia Chart Review:  Case: [id number] Date/Time: 08/06/23 0915   Procedures:      ENDARTERECTOMY, FEMORAL (Right)     BYPASS GRAFT FEMORAL-POPLITEAL ARTERY (Right)   Anesthesia type: General   Diagnosis: Atherosclerosis of native artery of right lower extremity with rest pain (HCC) [I70.221]   Pre-op diagnosis: CLI RLE   [facility] ROOM 11 / [facility] OR   Surgeons: Young Hensen, MD       DISCUSSION: Patient is an 83 year old female scheduled for the above procedure.  History includes  VS: BP (!) 144/64   Pulse 64   Temp 36.9 C (Oral)   Resp 18   Ht 5\' 2"  (1.575 m)   Wt 67.5 kg   BMI 27.23 kg/m   PROVIDERS: Bufford Carne, FNP   LABS: {CHL AN LABS REVIEWED:112001::"Labs reviewed: Acceptable for surgery."} (all labs ordered are listed, but only abnormal results are displayed)  Labs Reviewed  COMPREHENSIVE METABOLIC PANEL WITH GFR - Abnormal; Notable for the following components:      Result Value   BUN 33 (*)    Creatinine, Ser 2.05 (*)    Albumin 3.3 (*)    AST 13 (*)    GFR, Estimated 24 (*)    All other components within normal limits  URINALYSIS, ROUTINE W REFLEX MICROSCOPIC - Abnormal; Notable for the following components:   Glucose, UA 50 (*)    All other components within normal limits  CBC  PROTIME-INR  APTT  TYPE AND SCREEN     IMAGES: CXR 06/14/23: FINDINGS: The heart size and mediastinal contours are within normal limits. Both lungs are clear. The visualized skeletal structures are unremarkable. IMPRESSION: No active cardiopulmonary disease. Mild COPD changes with hyperinflation both lung bases  CT Head 05/22/23: IMPRESSION: No acute CT finding. Age related volume loss. Chronic small-vessel ischemic changes of the white matter.  CT C-spine 05/22/23: IMPRESSION: 1. No acute or traumatic finding. Distant ACDF C3 through C6 with solid union. 2. C2-3: Degenerative facet arthritis. Bilateral foraminal stenosis. 3. C6-7: Spondylosis  with endplate osteophytes. Mild canal narrowing. Bilateral bony foraminal narrowing. 4. C7-T1: Endplate osteophytes. No canal narrowing. Bilateral bony foraminal narrowing.   EKG: 05/22/23: Normal sinus rhythm Nonspecific T wave abnormality Abnormal ECG No significant change since 11/29/2017 Confirmed by Orvilla Blander ([id number]) on 05/22/2023 11:56:43 PM  CV: Echo 12/02/17: Study Conclusions  - Left ventricle: The cavity size was normal. Wall thickness was    normal. Systolic function was normal. The estimated ejection    fraction was in the range of 60% to 65%. Wall motion was normal;    there were no regional wall motion abnormalities. Left    ventricular diastolic function parameters were normal.  - Aortic valve: Mildly calcified annulus. Trileaflet; mildly    calcified leaflets.  - Mitral valve: Mildly thickened leaflets. There was moderate    regurgitation.  - Left atrium: The atrium was at the upper limits of normal in    size.  - Right atrium: Central venous pressure (est): 8 mm Hg.  - Atrial septum: No defect or patent foramen ovale was identified.  - Tricuspid valve: There was mild-moderate regurgitation.  - Pulmonary arteries: PA peak pressure: 41 mm Hg (S).  - Pericardium, extracardiac: There was no pericardial effusion.   Past Medical History:  Diagnosis Date   Anxiety    Arthritis    neck   Asthma    Cervical spondylolysis    Chronic kidney disease  Stage IIIB   Depression    Hashimoto's disease    Hyperlipidemia    Hypertension    Insulin  resistance    PONV (postoperative nausea and vomiting)    hasn't had surgery since 1993    Past Surgical History:  Procedure Laterality Date   ABDOMINAL AORTOGRAM W/LOWER EXTREMITY N/A 07/19/2023   Procedure: ABDOMINAL AORTOGRAM W/LOWER EXTREMITY;  Surgeon: Young Hensen, MD;  Location: [facility] INVASIVE CV LAB;  Service: Cardiovascular;  Laterality: N/A;   ANTERIOR CERVICAL DECOMP/DISCECTOMY FUSION N/A 12/06/2017    Procedure: ANTERIOR CERVICAL DECOMPRESSION/DISCECTOMY FUSION, CERVICAL 3- CERVICAL 4, CERVICAL 4- CERVICAL 5, CERVICAL 5- CERVICAL 6;  Surgeon: Augusto Blonder, MD;  Location: [facility] OR;  Service: Neurosurgery;  Laterality: N/A;  ANTERIOR CERVICAL DECOMPRESSION/DISCECTOMY FUSION, CERVICAL THREE- CERVICAL FOUR, CERVICAL FOUR- CERVICAL FIVE, CERVICAL FIVE- CERVICAL SIX   APPENDECTOMY     BREAST SURGERY     left biopsy   CESAREAN SECTION     x2   CHOLECYSTECTOMY     DENTAL SURGERY     teeth removal   LOWER EXTREMITY ANGIOGRAPHY N/A 07/19/2023   Procedure: Lower Extremity Angiography;  Surgeon: Young Hensen, MD;  Location: [facility] INVASIVE CV LAB;  Service: Cardiovascular;  Laterality: N/A;   OVARY SURGERY     ovarian wedge   TONSILLECTOMY      MEDICATIONS:  acetaminophen  (TYLENOL ) 325 MG tablet   acetaminophen  (TYLENOL ) 650 MG CR tablet   albuterol  (PROVENTIL  HFA;VENTOLIN  HFA) 108 (90 Base) MCG/ACT inhaler   ALPRAZolam  (XANAX ) 0.25 MG tablet   aspirin  325 MG tablet   atorvastatin (LIPITOR) 20 MG tablet   azithromycin  (ZITHROMAX ) 250 MG tablet   budesonide -formoterol  (SYMBICORT ) 160-4.5 MCG/ACT inhaler   cholecalciferol (VITAMIN D3) 25 MCG (1000 UNIT) tablet   fluticasone  (FLONASE ) 50 MCG/ACT nasal spray   furosemide  (LASIX ) 20 MG tablet   gabapentin (NEURONTIN) 100 MG capsule   JARDIANCE 10 MG TABS tablet   levothyroxine  (SYNTHROID ) 75 MCG tablet   lisinopril  (ZESTRIL ) 10 MG tablet   Magnesium  200 MG TABS   metaxalone  (SKELAXIN ) 800 MG tablet   omeprazole (PRILOSEC) 40 MG capsule   Polyethyl Glycol-Propyl Glycol (SYSTANE OP)   polyethylene glycol (MIRALAX  / GLYCOLAX ) 17 g packet   promethazine-dextromethorphan (PROMETHAZINE-DM) 6.25-15 MG/5ML syrup   senna-docusate (SENOKOT-S) 8.6-50 MG tablet   No current facility-administered medications for this encounter.

## 2023-07-31 ENCOUNTER — Telehealth: Payer: Self-pay

## 2023-07-31 ENCOUNTER — Encounter (HOSPITAL_COMMUNITY): Payer: Self-pay

## 2023-07-31 NOTE — Telephone Encounter (Signed)
 Attempted to return patient's call re: surgical questions.  LVM.

## 2023-07-31 NOTE — Anesthesia Preprocedure Evaluation (Addendum)
 Anesthesia Evaluation  Patient identified by MRN, date of birth, ID band Patient awake    Reviewed: Allergy & Precautions, NPO status , Patient's Chart, lab work & pertinent test results  History of Anesthesia Complications (+) PONV and history of anesthetic complications  Airway Mallampati: II  TM Distance: >3 FB Neck ROM: Full    Dental  (+) Edentulous Upper, Edentulous Lower, Dental Advisory Given   Pulmonary asthma , former smoker   Pulmonary exam normal breath sounds clear to auscultation       Cardiovascular hypertension, Pt. on medications + Peripheral Vascular Disease  Normal cardiovascular exam+ Valvular Problems/Murmurs (mod MR) MR  Rhythm:Regular Rate:Normal  TTE 2019 - Left ventricle: The cavity size was normal. Wall thickness was    normal. Systolic function was normal. The estimated ejection    fraction was in the range of 60% to 65%. Wall motion was normal;    there were no regional wall motion abnormalities. Left    ventricular diastolic function parameters were normal.  - Aortic valve: Mildly calcified annulus. Trileaflet; mildly    calcified leaflets.  - Mitral valve: Mildly thickened leaflets. There was moderate    regurgitation.  - Left atrium: The atrium was at the upper limits of normal in    size.  - Right atrium: Central venous pressure (est): 8 mm Hg.  - Atrial septum: No defect or patent foramen ovale was identified.  - Tricuspid valve: There was mild-moderate regurgitation.  - Pulmonary arteries: PA peak pressure: 41 mm Hg (S).  - Pericardium, extracardiac: There was no pericardial effusion.     Neuro/Psych  PSYCHIATRIC DISORDERS Anxiety Depression    negative neurological ROS     GI/Hepatic negative GI ROS, Neg liver ROS,,,  Endo/Other  diabetes, Type 2, Oral Hypoglycemic AgentsHypothyroidism    Renal/GU Renal InsufficiencyRenal diseaseLab Results      Component                Value                Date                      NA                       135                 07/27/2023                CL                       101                 07/27/2023                K                        5.0                 07/27/2023                CO2                      24                  07/27/2023                BUN  33 (H)              07/27/2023                CREATININE               2.05 (H)            07/27/2023                GFRNONAA                 24 (L)              07/27/2023                CALCIUM                  9.1                 07/27/2023                PHOS                     4.1                 12/06/2017                ALBUMIN                  3.3 (L)             07/27/2023                GLUCOSE                  99                  07/27/2023             negative genitourinary   Musculoskeletal  (+) Arthritis ,  S/p ACDF   Abdominal   Peds  Hematology negative hematology ROS (+)   Anesthesia Other Findings   Reproductive/Obstetrics                              Anesthesia Physical Anesthesia Plan  ASA: 3  Anesthesia Plan: General   Post-op Pain Management: Tylenol  PO (pre-op)*   Induction: Intravenous  PONV Risk Score and Plan: 4 or greater and Dexamethasone , Ondansetron  and Treatment may vary due to age or medical condition  Airway Management Planned: Oral ETT and Video Laryngoscope Planned  Additional Equipment: Arterial line  Intra-op Plan:   Post-operative Plan: Extubation in OR  Informed Consent: I have reviewed the patients History and Physical, chart, labs and discussed the procedure including the risks, benefits and alternatives for the proposed anesthesia with the patient or authorized representative who has indicated his/her understanding and acceptance.     Dental advisory given  Plan Discussed with: CRNA  Anesthesia Plan Comments: ( )       Anesthesia Quick Evaluation

## 2023-08-01 ENCOUNTER — Encounter

## 2023-08-02 ENCOUNTER — Telehealth: Payer: Self-pay

## 2023-08-02 NOTE — Telephone Encounter (Signed)
 Patient called asking for advise re: her heart valves (small mitral valve leak) and if her surgery is still scheduled for Friday.  This nurse left a message for her stating that it is important for her to have the surgery on Monday with Dr. Fulton Job and yes, she is still scheduled for surgery.

## 2023-08-06 ENCOUNTER — Encounter (HOSPITAL_COMMUNITY): Payer: Self-pay | Admitting: Vascular Surgery

## 2023-08-06 ENCOUNTER — Other Ambulatory Visit: Payer: Self-pay

## 2023-08-06 ENCOUNTER — Encounter (HOSPITAL_COMMUNITY)
Admission: RE | Disposition: A | Discharge status: Home / Self Care | Payer: Self-pay | Source: Home / Self Care | Attending: Vascular Surgery

## 2023-08-06 ENCOUNTER — Inpatient Hospital Stay (HOSPITAL_COMMUNITY): Payer: Self-pay | Admitting: Vascular Surgery

## 2023-08-06 ENCOUNTER — Inpatient Hospital Stay (HOSPITAL_COMMUNITY)
Admission: RE | Admit: 2023-08-06 | Discharge: 2023-08-08 | DRG: 272 | Disposition: A | Discharge status: Home / Self Care | Attending: Vascular Surgery | Admitting: Vascular Surgery

## 2023-08-06 ENCOUNTER — Inpatient Hospital Stay (HOSPITAL_COMMUNITY): Payer: Self-pay | Admitting: Anesthesiology

## 2023-08-06 DIAGNOSIS — E063 Autoimmune thyroiditis: Secondary | ICD-10-CM | POA: Diagnosis present

## 2023-08-06 DIAGNOSIS — Z882 Allergy status to sulfonamides status: Secondary | ICD-10-CM

## 2023-08-06 DIAGNOSIS — J45909 Unspecified asthma, uncomplicated: Secondary | ICD-10-CM

## 2023-08-06 DIAGNOSIS — Z886 Allergy status to analgesic agent status: Secondary | ICD-10-CM | POA: Diagnosis not present

## 2023-08-06 DIAGNOSIS — I70229 Atherosclerosis of native arteries of extremities with rest pain, unspecified extremity: Secondary | ICD-10-CM | POA: Diagnosis present

## 2023-08-06 DIAGNOSIS — Z7951 Long term (current) use of inhaled steroids: Secondary | ICD-10-CM | POA: Diagnosis not present

## 2023-08-06 DIAGNOSIS — Z8249 Family history of ischemic heart disease and other diseases of the circulatory system: Secondary | ICD-10-CM | POA: Diagnosis not present

## 2023-08-06 DIAGNOSIS — E119 Type 2 diabetes mellitus without complications: Secondary | ICD-10-CM

## 2023-08-06 DIAGNOSIS — Z825 Family history of asthma and other chronic lower respiratory diseases: Secondary | ICD-10-CM | POA: Diagnosis not present

## 2023-08-06 DIAGNOSIS — I70221 Atherosclerosis of native arteries of extremities with rest pain, right leg: Secondary | ICD-10-CM

## 2023-08-06 DIAGNOSIS — Z79890 Hormone replacement therapy (postmenopausal): Secondary | ICD-10-CM

## 2023-08-06 DIAGNOSIS — Z01818 Encounter for other preprocedural examination: Secondary | ICD-10-CM

## 2023-08-06 DIAGNOSIS — Z880 Allergy status to penicillin: Secondary | ICD-10-CM

## 2023-08-06 DIAGNOSIS — Z79899 Other long term (current) drug therapy: Secondary | ICD-10-CM | POA: Diagnosis not present

## 2023-08-06 DIAGNOSIS — I70201 Unspecified atherosclerosis of native arteries of extremities, right leg: Principal | ICD-10-CM | POA: Diagnosis present

## 2023-08-06 DIAGNOSIS — I10 Essential (primary) hypertension: Secondary | ICD-10-CM

## 2023-08-06 DIAGNOSIS — N1832 Chronic kidney disease, stage 3b: Secondary | ICD-10-CM | POA: Diagnosis present

## 2023-08-06 DIAGNOSIS — R110 Nausea: Secondary | ICD-10-CM | POA: Diagnosis present

## 2023-08-06 DIAGNOSIS — E785 Hyperlipidemia, unspecified: Secondary | ICD-10-CM | POA: Diagnosis present

## 2023-08-06 DIAGNOSIS — Z87891 Personal history of nicotine dependence: Secondary | ICD-10-CM

## 2023-08-06 DIAGNOSIS — Z7984 Long term (current) use of oral hypoglycemic drugs: Secondary | ICD-10-CM | POA: Diagnosis not present

## 2023-08-06 DIAGNOSIS — Z888 Allergy status to other drugs, medicaments and biological substances status: Secondary | ICD-10-CM

## 2023-08-06 DIAGNOSIS — I129 Hypertensive chronic kidney disease with stage 1 through stage 4 chronic kidney disease, or unspecified chronic kidney disease: Secondary | ICD-10-CM | POA: Diagnosis present

## 2023-08-06 HISTORY — PX: PATCH ANGIOPLASTY: SHX6230

## 2023-08-06 HISTORY — PX: ENDARTERECTOMY FEMORAL: SHX5804

## 2023-08-06 LAB — POCT I-STAT 7, (LYTES, BLD GAS, ICA,H+H)
Acid-base deficit: 10 mmol/L — ABNORMAL HIGH (ref 0.0–2.0)
Acid-base deficit: 7 mmol/L — ABNORMAL HIGH (ref 0.0–2.0)
Bicarbonate: 17.7 mmol/L — ABNORMAL LOW (ref 20.0–28.0)
Bicarbonate: 18.9 mmol/L — ABNORMAL LOW (ref 20.0–28.0)
Calcium, Ion: 1.16 mmol/L (ref 1.15–1.40)
Calcium, Ion: 1.21 mmol/L (ref 1.15–1.40)
HCT: 31 % — ABNORMAL LOW (ref 36.0–46.0)
HCT: 29 % — ABNORMAL LOW (ref 36.0–46.0)
Hemoglobin: 10.5 g/dL — ABNORMAL LOW (ref 12.0–15.0)
Hemoglobin: 9.9 g/dL — ABNORMAL LOW (ref 12.0–15.0)
O2 Saturation: 99 %
O2 Saturation: 100 %
Potassium: 3.5 mmol/L (ref 3.5–5.1)
Potassium: 4.1 mmol/L (ref 3.5–5.1)
Sodium: 139 mmol/L (ref 135–145)
Sodium: 138 mmol/L (ref 135–145)
TCO2: 19 mmol/L — ABNORMAL LOW (ref 22–32)
TCO2: 20 mmol/L — ABNORMAL LOW (ref 22–32)
pCO2 arterial: 43.4 mmHg (ref 32–48)
pCO2 arterial: 38.6 mmHg (ref 32–48)
pH, Arterial: 7.218 — ABNORMAL LOW (ref 7.35–7.45)
pH, Arterial: 7.299 — ABNORMAL LOW (ref 7.35–7.45)
pO2, Arterial: 180 mmHg — ABNORMAL HIGH (ref 83–108)
pO2, Arterial: 184 mmHg — ABNORMAL HIGH (ref 83–108)

## 2023-08-06 LAB — CBC
HCT: 35.6 % — ABNORMAL LOW (ref 36.0–46.0)
Hemoglobin: 11.3 g/dL — ABNORMAL LOW (ref 12.0–15.0)
MCH: 30.4 pg (ref 26.0–34.0)
MCHC: 31.7 g/dL (ref 30.0–36.0)
MCV: 95.7 fL (ref 80.0–100.0)
Platelets: 240 10*3/uL (ref 150–400)
RBC: 3.72 MIL/uL — ABNORMAL LOW (ref 3.87–5.11)
RDW: 14.6 % (ref 11.5–15.5)
WBC: 7.7 10*3/uL (ref 4.0–10.5)
nRBC: 0 % (ref 0.0–0.2)

## 2023-08-06 LAB — CREATININE, SERUM
Creatinine, Ser: 1.7 mg/dL — ABNORMAL HIGH (ref 0.44–1.00)
GFR, Estimated: 30 mL/min — ABNORMAL LOW (ref >60)

## 2023-08-06 LAB — POCT ACTIVATED CLOTTING TIME
Activated Clotting Time: 210 s
Activated Clotting Time: 239 s
Activated Clotting Time: 135 s
Activated Clotting Time: 245 s

## 2023-08-06 LAB — GLUCOSE, CAPILLARY
Glucose-Capillary: 119 mg/dL — ABNORMAL HIGH (ref 70–99)
Glucose-Capillary: 139 mg/dL — ABNORMAL HIGH (ref 70–99)
Glucose-Capillary: 102 mg/dL — ABNORMAL HIGH (ref 70–99)

## 2023-08-06 SURGERY — ENDARTERECTOMY, FEMORAL
Anesthesia: General | Laterality: Right

## 2023-08-06 MED ORDER — GUAIFENESIN-DM 100-10 MG/5ML PO SYRP: 15.0000 mL | ORAL_SOLUTION | ORAL | Status: DC | PRN

## 2023-08-06 MED ORDER — INSULIN ASPART 100 UNIT/ML IJ SOLN
0.0000 [IU] | Freq: Three times a day (TID) | INTRAMUSCULAR | Status: DC
  Administered 2023-08-06: 3 [IU] via SUBCUTANEOUS

## 2023-08-06 MED ORDER — ACETAMINOPHEN 325 MG PO TABS: 325.0000 mg | ORAL_TABLET | ORAL | Status: DC | PRN

## 2023-08-06 MED ORDER — SENNOSIDES-DOCUSATE SODIUM 8.6-50 MG PO TABS: 1.0000 | ORAL_TABLET | Freq: Every evening | ORAL | Status: DC | PRN

## 2023-08-06 MED ORDER — PHENYLEPHRINE HCL-NACL 20-0.9 MG/250ML-% IV SOLN
INTRAVENOUS | Status: DC | PRN
  Administered 2023-08-06: 25 ug/min via INTRAVENOUS

## 2023-08-06 MED ORDER — ALBUMIN HUMAN 5 % IV SOLN: INTRAVENOUS | Status: DC | PRN

## 2023-08-06 MED ORDER — HEMOSTATIC AGENTS (NO CHARGE) OPTIME
TOPICAL | Status: DC | PRN
Start: 2023-08-06 — End: 2023-08-06
  Administered 2023-08-06: 1 via TOPICAL

## 2023-08-06 MED ORDER — SODIUM CHLORIDE 0.9 % IV SOLN: 500.0000 mL | Freq: Once | INTRAVENOUS | Status: DC | PRN

## 2023-08-06 MED ORDER — SUGAMMADEX SODIUM 200 MG/2ML IV SOLN
INTRAVENOUS | Status: DC | PRN
  Administered 2023-08-06: 200 mg via INTRAVENOUS

## 2023-08-06 MED ORDER — LABETALOL HCL 5 MG/ML IV SOLN: 10.0000 mg | INTRAVENOUS | Status: DC | PRN

## 2023-08-06 MED ORDER — CHLORHEXIDINE GLUCONATE CLOTH 2 % EX PADS: 6.0000 | MEDICATED_PAD | Freq: Once | CUTANEOUS | Status: DC

## 2023-08-06 MED ORDER — HEPARIN SODIUM (PORCINE) 1000 UNIT/ML IJ SOLN
INTRAMUSCULAR | Status: DC | PRN
  Administered 2023-08-06: 2000 [IU] via INTRAVENOUS
  Administered 2023-08-06: 7000 [IU] via INTRAVENOUS
  Administered 2023-08-06: 3000 [IU] via INTRAVENOUS

## 2023-08-06 MED ORDER — FENTANYL CITRATE (PF) 100 MCG/2ML IJ SOLN
25.0000 ug | INTRAMUSCULAR | Status: DC | PRN
  Administered 2023-08-06: 25 ug via INTRAVENOUS

## 2023-08-06 MED ORDER — HEPARIN 6000 UNIT IRRIGATION SOLUTION
Status: AC
  Filled 2023-08-06: qty 500

## 2023-08-06 MED ORDER — LIDOCAINE 2% (20 MG/ML) 5 ML SYRINGE
INTRAMUSCULAR | Status: DC | PRN
  Administered 2023-08-06: 60 mg via INTRAVENOUS

## 2023-08-06 MED ORDER — FENTANYL CITRATE (PF) 250 MCG/5ML IJ SOLN
INTRAMUSCULAR | Status: AC
  Filled 2023-08-06: qty 5

## 2023-08-06 MED ORDER — FENTANYL CITRATE (PF) 250 MCG/5ML IJ SOLN
INTRAMUSCULAR | Status: DC | PRN
Start: 2023-08-06 — End: 2023-08-06
  Administered 2023-08-06: 50 ug via INTRAVENOUS
  Administered 2023-08-06: 25 ug via INTRAVENOUS
  Administered 2023-08-06: 50 ug via INTRAVENOUS
  Administered 2023-08-06: 25 ug via INTRAVENOUS

## 2023-08-06 MED ORDER — METOPROLOL TARTRATE 5 MG/5ML IV SOLN: 2.0000 mg | INTRAVENOUS | Status: DC | PRN

## 2023-08-06 MED ORDER — PHENOL 1.4 % MT LIQD: 1.0000 | OROMUCOSAL | Status: DC | PRN

## 2023-08-06 MED ORDER — FENTANYL CITRATE (PF) 100 MCG/2ML IJ SOLN
INTRAMUSCULAR | Status: AC
Start: 2023-08-06 — End: 2023-08-06
  Filled 2023-08-06: qty 2

## 2023-08-06 MED ORDER — BISACODYL 5 MG PO TBEC: 5.0000 mg | DELAYED_RELEASE_TABLET | Freq: Every day | ORAL | Status: DC | PRN

## 2023-08-06 MED ORDER — GABAPENTIN 100 MG PO CAPS
100.0000 mg | ORAL_CAPSULE | Freq: Two times a day (BID) | ORAL | Status: DC
  Administered 2023-08-06 – 2023-08-08 (×5): 100 mg via ORAL
  Filled 2023-08-06 (×5): qty 1

## 2023-08-06 MED ORDER — SODIUM CHLORIDE 0.9 % IV SOLN: INTRAVENOUS | Status: DC | PRN

## 2023-08-06 MED ORDER — PROTAMINE SULFATE 10 MG/ML IV SOLN
INTRAVENOUS | Status: DC | PRN
  Administered 2023-08-06: 50 mg via INTRAVENOUS

## 2023-08-06 MED ORDER — ATORVASTATIN CALCIUM 10 MG PO TABS
20.0000 mg | ORAL_TABLET | Freq: Every day | ORAL | Status: DC
  Administered 2023-08-06 – 2023-08-08 (×3): 20 mg via ORAL
  Filled 2023-08-06 (×3): qty 2

## 2023-08-06 MED ORDER — EPHEDRINE SULFATE-NACL 50-0.9 MG/10ML-% IV SOSY
PREFILLED_SYRINGE | INTRAVENOUS | Status: DC | PRN
  Administered 2023-08-06: 5 mg via INTRAVENOUS

## 2023-08-06 MED ORDER — VANCOMYCIN HCL IN DEXTROSE 1-5 GM/200ML-% IV SOLN
1000.0000 mg | INTRAVENOUS | Status: AC
  Administered 2023-08-06: 1000 mg via INTRAVENOUS
  Filled 2023-08-06: qty 200

## 2023-08-06 MED ORDER — SODIUM CHLORIDE 0.9 % IV SOLN: INTRAVENOUS | Status: DC

## 2023-08-06 MED ORDER — 0.9 % SODIUM CHLORIDE (POUR BTL) OPTIME
TOPICAL | Status: DC | PRN
  Administered 2023-08-06: 2000 mL

## 2023-08-06 MED ORDER — LISINOPRIL 10 MG PO TABS
10.0000 mg | ORAL_TABLET | Freq: Every day | ORAL | Status: DC
  Administered 2023-08-06 – 2023-08-08 (×3): 10 mg via ORAL
  Filled 2023-08-06 (×3): qty 1

## 2023-08-06 MED ORDER — ASPIRIN 325 MG PO TBEC
325.0000 mg | DELAYED_RELEASE_TABLET | Freq: Every day | ORAL | Status: DC
  Administered 2023-08-07 – 2023-08-08 (×2): 325 mg via ORAL
  Filled 2023-08-06 (×2): qty 1

## 2023-08-06 MED ORDER — ONDANSETRON HCL 4 MG/2ML IJ SOLN
4.0000 mg | Freq: Four times a day (QID) | INTRAMUSCULAR | Status: DC | PRN
  Administered 2023-08-07 (×2): 4 mg via INTRAVENOUS
  Filled 2023-08-06 (×2): qty 2

## 2023-08-06 MED ORDER — HEPARIN 6000 UNIT IRRIGATION SOLUTION
Status: DC | PRN
  Administered 2023-08-06: 1

## 2023-08-06 MED ORDER — EMPAGLIFLOZIN 10 MG PO TABS
10.0000 mg | ORAL_TABLET | Freq: Every day | ORAL | Status: DC
  Administered 2023-08-06 – 2023-08-08 (×3): 10 mg via ORAL
  Filled 2023-08-06 (×3): qty 1

## 2023-08-06 MED ORDER — HYDRALAZINE HCL 20 MG/ML IJ SOLN: 5.0000 mg | INTRAMUSCULAR | Status: DC | PRN

## 2023-08-06 MED ORDER — ROCURONIUM BROMIDE 10 MG/ML (PF) SYRINGE
PREFILLED_SYRINGE | INTRAVENOUS | Status: DC | PRN
  Administered 2023-08-06 (×2): 20 mg via INTRAVENOUS
  Administered 2023-08-06: 50 mg via INTRAVENOUS

## 2023-08-06 MED ORDER — OXYCODONE HCL 5 MG PO TABS: 5.0000 mg | ORAL_TABLET | Freq: Once | ORAL | Status: DC | PRN

## 2023-08-06 MED ORDER — LACTATED RINGERS IV SOLN
INTRAVENOUS | Status: DC
Start: 2023-08-06 — End: 2023-08-06

## 2023-08-06 MED ORDER — PROPOFOL 10 MG/ML IV BOLUS
INTRAVENOUS | Status: AC
  Filled 2023-08-06: qty 20

## 2023-08-06 MED ORDER — MAGNESIUM CITRATE PO SOLN: 1.0000 | Freq: Once | ORAL | Status: DC | PRN

## 2023-08-06 MED ORDER — DEXAMETHASONE SODIUM PHOSPHATE 10 MG/ML IJ SOLN
INTRAMUSCULAR | Status: DC | PRN
  Administered 2023-08-06: 10 mg via INTRAVENOUS

## 2023-08-06 MED ORDER — ONDANSETRON HCL 4 MG/2ML IJ SOLN
INTRAMUSCULAR | Status: DC | PRN
  Administered 2023-08-06: 4 mg via INTRAVENOUS

## 2023-08-06 MED ORDER — ORAL CARE MOUTH RINSE: 15.0000 mL | Freq: Once | OROMUCOSAL | Status: AC

## 2023-08-06 MED ORDER — PHENYLEPHRINE 80 MCG/ML (10ML) SYRINGE FOR IV PUSH (FOR BLOOD PRESSURE SUPPORT)
PREFILLED_SYRINGE | INTRAVENOUS | Status: DC | PRN
  Administered 2023-08-06: 160 ug via INTRAVENOUS
  Administered 2023-08-06 (×2): 40 ug via INTRAVENOUS

## 2023-08-06 MED ORDER — ALUM & MAG HYDROXIDE-SIMETH 200-200-20 MG/5ML PO SUSP: 15.0000 mL | ORAL | Status: DC | PRN

## 2023-08-06 MED ORDER — ACETAMINOPHEN 500 MG PO TABS
1000.0000 mg | ORAL_TABLET | Freq: Once | ORAL | Status: AC
  Administered 2023-08-06: 1000 mg via ORAL
  Filled 2023-08-06: qty 2

## 2023-08-06 MED ORDER — LEVOTHYROXINE SODIUM 75 MCG PO TABS
75.0000 ug | ORAL_TABLET | Freq: Every day | ORAL | Status: DC
  Administered 2023-08-07 – 2023-08-08 (×2): 75 ug via ORAL
  Filled 2023-08-06 (×2): qty 1

## 2023-08-06 MED ORDER — OXYCODONE HCL 5 MG PO TABS: 5.0000 mg | ORAL_TABLET | ORAL | Status: DC | PRN

## 2023-08-06 MED ORDER — CHLORHEXIDINE GLUCONATE 0.12 % MT SOLN
15.0000 mL | Freq: Once | OROMUCOSAL | Status: AC
  Administered 2023-08-06: 15 mL via OROMUCOSAL
  Filled 2023-08-06: qty 15

## 2023-08-06 MED ORDER — VANCOMYCIN HCL 500 MG/100ML IV SOLN
500.0000 mg | INTRAVENOUS | Status: AC
  Administered 2023-08-07 – 2023-08-08 (×2): 500 mg via INTRAVENOUS
  Filled 2023-08-06 (×2): qty 100

## 2023-08-06 MED ORDER — POTASSIUM CHLORIDE CRYS ER 20 MEQ PO TBCR: 20.0000 meq | EXTENDED_RELEASE_TABLET | Freq: Every day | ORAL | Status: DC | PRN

## 2023-08-06 MED ORDER — PROPOFOL 10 MG/ML IV BOLUS
INTRAVENOUS | Status: DC | PRN
  Administered 2023-08-06: 80 mg via INTRAVENOUS
  Administered 2023-08-06: 30 mg via INTRAVENOUS

## 2023-08-06 MED ORDER — MAGNESIUM SULFATE 2 GM/50ML IV SOLN: 2.0000 g | Freq: Every day | INTRAVENOUS | Status: DC | PRN

## 2023-08-06 MED ORDER — PANTOPRAZOLE SODIUM 40 MG PO TBEC
40.0000 mg | DELAYED_RELEASE_TABLET | Freq: Every day | ORAL | Status: DC
  Administered 2023-08-06 – 2023-08-08 (×3): 40 mg via ORAL
  Filled 2023-08-06 (×3): qty 1

## 2023-08-06 MED ORDER — DOCUSATE SODIUM 100 MG PO CAPS
100.0000 mg | ORAL_CAPSULE | Freq: Every day | ORAL | Status: DC
  Administered 2023-08-07 – 2023-08-08 (×2): 100 mg via ORAL
  Filled 2023-08-06 (×2): qty 1

## 2023-08-06 MED ORDER — OXYCODONE HCL 5 MG/5ML PO SOLN: 5.0000 mg | Freq: Once | ORAL | Status: DC | PRN

## 2023-08-06 MED ORDER — ACETAMINOPHEN 650 MG RE SUPP: 325.0000 mg | RECTAL | Status: DC | PRN

## 2023-08-06 MED ORDER — SODIUM CHLORIDE 0.9 % IV SOLN: INTRAVENOUS | Status: AC

## 2023-08-06 MED ORDER — ALPRAZOLAM 0.25 MG PO TABS: 0.2500 mg | ORAL_TABLET | Freq: Three times a day (TID) | ORAL | Status: DC | PRN

## 2023-08-06 MED ORDER — HEPARIN SODIUM (PORCINE) 5000 UNIT/ML IJ SOLN
5000.0000 [IU] | Freq: Three times a day (TID) | INTRAMUSCULAR | Status: DC
  Administered 2023-08-07 – 2023-08-08 (×4): 5000 [IU] via SUBCUTANEOUS
  Filled 2023-08-06 (×4): qty 1

## 2023-08-06 MED ORDER — LACTATED RINGERS IV SOLN: INTRAVENOUS | Status: DC | PRN

## 2023-08-06 SURGICAL SUPPLY — 55 items
BAG COUNTER SPONGE SURGICOUNT (BAG) ×1 IMPLANT
BANDAGE ESMARK 6X9 LF (GAUZE/BANDAGES/DRESSINGS) IMPLANT
BLADE CLIPPER SURG (BLADE) ×1 IMPLANT
CANISTER SUCT 3000ML PPV (MISCELLANEOUS) ×1 IMPLANT
CLIP FOGARTY SPRING 6M (CLIP) ×1 IMPLANT
CLIP TI MEDIUM 24 (CLIP) ×1 IMPLANT
CLIP TI WIDE RED SMALL 24 (CLIP) ×1 IMPLANT
COVER PROBE W GEL 5X96 (DRAPES) ×1 IMPLANT
CUFF TOURN SGL QUICK 42 (TOURNIQUET CUFF) IMPLANT
CUFF TRNQT CYL 24X4X16.5-23 (TOURNIQUET CUFF) IMPLANT
CUFF TRNQT CYL 34X4.125X (TOURNIQUET CUFF) IMPLANT
DERMABOND ADVANCED .7 DNX12 (GAUZE/BANDAGES/DRESSINGS) ×1 IMPLANT
DRAIN CHANNEL 15F RND FF W/TCR (WOUND CARE) IMPLANT
DRAPE C-ARM 42X72 X-RAY (DRAPES) ×1 IMPLANT
DRAPE HALF SHEET 40X57 (DRAPES) IMPLANT
ELECTRODE REM PT RTRN 9FT ADLT (ELECTROSURGICAL) ×1 IMPLANT
EVACUATOR SILICONE 100CC (DRAIN) IMPLANT
GLOVE BIO SURGEON STRL SZ7.5 (GLOVE) ×1 IMPLANT
GLOVE BIOGEL PI IND STRL 6.5 (GLOVE) IMPLANT
GLOVE BIOGEL PI IND STRL 7.0 (GLOVE) IMPLANT
GLOVE BIOGEL PI IND STRL 7.5 (GLOVE) IMPLANT
GLOVE BIOGEL PI IND STRL 8 (GLOVE) ×1 IMPLANT
GLOVE BIOGEL PI MICRO STRL 6.5 (GLOVE) IMPLANT
GLOVE SURG SS PI 6.5 STRL IVOR (GLOVE) IMPLANT
GOWN STRL REUS W/ TWL LRG LVL3 (GOWN DISPOSABLE) ×3 IMPLANT
GOWN STRL REUS W/ TWL XL LVL3 (GOWN DISPOSABLE) ×2 IMPLANT
GRAFT VASC PATCH XENOSURE 1X14 (Vascular Products) IMPLANT
HEMOSTAT SNOW SURGICEL 2X4 (HEMOSTASIS) IMPLANT
INSERT FOGARTY SM (MISCELLANEOUS) IMPLANT
KIT BASIN OR (CUSTOM PROCEDURE TRAY) ×1 IMPLANT
KIT TURNOVER KIT B (KITS) ×1 IMPLANT
LOOP VESSEL MINI RED (MISCELLANEOUS) IMPLANT
NS IRRIG 1000ML POUR BTL (IV SOLUTION) ×2 IMPLANT
PACK PERIPHERAL VASCULAR (CUSTOM PROCEDURE TRAY) ×1 IMPLANT
PAD ARMBOARD POSITIONER FOAM (MISCELLANEOUS) ×2 IMPLANT
SET MICROPUNCTURE 5F STIFF (MISCELLANEOUS) IMPLANT
STOPCOCK 4 WAY LG BORE MALE ST (IV SETS) IMPLANT
SUT ETHILON 3 0 PS 1 (SUTURE) IMPLANT
SUT GORETEX 5 0 TT13 24 (SUTURE) IMPLANT
SUT GORETEX 6.0 TT13 (SUTURE) IMPLANT
SUT MNCRL AB 4-0 PS2 18 (SUTURE) ×3 IMPLANT
SUT PROLENE 5 0 C 1 24 (SUTURE) ×1 IMPLANT
SUT PROLENE 6 0 BV (SUTURE) ×1 IMPLANT
SUT PROLENE 7 0 BV 1 (SUTURE) IMPLANT
SUT SILK 2 0 PERMA HAND 18 BK (SUTURE) IMPLANT
SUT SILK 2-0 18XBRD TIE 12 (SUTURE) IMPLANT
SUT SILK 3-0 18XBRD TIE 12 (SUTURE) IMPLANT
SUT VIC AB 2-0 CT1 TAPERPNT 27 (SUTURE) ×2 IMPLANT
SUT VIC AB 3-0 SH 27X BRD (SUTURE) ×3 IMPLANT
TAPE UMBILICAL 1/8X30 (MISCELLANEOUS) IMPLANT
TOWEL GREEN STERILE (TOWEL DISPOSABLE) ×1 IMPLANT
TRAY FOLEY MTR SLVR 16FR STAT (SET/KITS/TRAYS/PACK) ×1 IMPLANT
TUBING EXTENTION W/L.L. (IV SETS) IMPLANT
UNDERPAD 30X36 HEAVY ABSORB (UNDERPADS AND DIAPERS) ×1 IMPLANT
WATER STERILE IRR 1000ML POUR (IV SOLUTION) ×1 IMPLANT

## 2023-08-06 NOTE — Anesthesia Procedure Notes (Signed)
 Arterial Line Insertion Start/End5/08/2023 7:30 AM, 08/06/2023 7:30 AM Performed by: Hebert Littler, CRNA, CRNA  Patient location: Pre-op. Preanesthetic checklist: patient identified, IV checked, site marked, risks and benefits discussed, surgical consent, monitors and equipment checked, pre-op evaluation and timeout performed Lidocaine  1% used for infiltration Right, radial was placed Catheter size: 20 G Hand hygiene performed , maximum sterile barriers used  and Seldinger technique used Allen's test indicative of satisfactory collateral circulation Attempts: 1 Procedure performed without using ultrasound guided technique. Ultrasound Notes:anatomy identified, needle tip was noted to be adjacent to the nerve/plexus identified and no ultrasound evidence of intravascular and/or intraneural injection Following insertion, dressing applied and Biopatch. Post procedure assessment: normal  Patient tolerated the procedure well with no immediate complications.

## 2023-08-06 NOTE — Plan of Care (Signed)

## 2023-08-06 NOTE — Op Note (Signed)
 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 Young Hensen, MD  Assistant: Wynonia Hedges, PA  Indications: 83 year old female seen with critical limb ischemia with rest pain in the right lower extremity with an ABI of 0.4.  She underwent angiogram showing a high-grade right common femoral artery stenosis with also flush SFA occlusion.  She presents today for right lower extremity revascularization after risks benefits discussed.  An assistant was needed given the complexity of the case and also for femoral endarterectomy and sewing the patch angioplasty.  Findings: Transverse incision in the right groin.  Dissected out the common femoral as well as the SFA and profunda.  Endarterectomy was then performed of the distal external iliac, common femoral, proximal profunda and also endarterectomy of the proximal SFA.  The patch was sewn from the common femoral artery onto the proximal profunda.  Palpable femoral pulse with brisk profundus signal at completion.  Anesthesia: General  Details: Patient was taken to the operating room after informed consent was obtained.  Placed on operative table in the supine position.  General endotracheal anesthesia was induced.  I used ultrasound to mark out the common femoral in the right groin.  The right groin and right leg were then prepped and draped in standard sterile fashion.  Antibiotics were given and timeout performed.  Transverse incision was then made in the right groin above inguinal crease.  Dissected down with Bovie cautery and used cerebellar retractors.  The femoral sheath was opened longitudinally.  There were multiple lymph nodes in the groin that were carefully dissected around.  Ultimately we dissected out the common femoral  onto the SFA and profunda.  The SFA and profunda were both controlled with Vesseloops.  Dissected under the inguinal ligament and again ligated the circumflex vein.  The external iliac artery was then controlled with a vessel loop.  All other small branches off the common femoral were controlled with small Vesseloops.  Patient was given 100 units/kg IV heparin .  ACT was checked to maintain greater than 250.  Additional IV heparin  was given.  Once we had a therapeutic ACT I clamped the profunda with a baby profunda clamp and the SFA was controlled with a vessel loop and then I used a Henley clamp on the external iliac artery.  The right common femoral artery was opened with 11 blade scalpel Potts scissors and I extended this up to the proximal common femoral.  We then used a Runner, broadcasting/film/video and an endarterectomy was performed of the common femoral up to the external iliac with the help of my assistant.  I then carried the endarterectomy down onto the proximal profunda and extended the arteriotomy onto the proximal profunda.  I was very happy with how the endarterectomy feathered here.  I did tack down the endarterectomy site with 6-0 Prolene distally.  We then endarterectomized the proximal SFA that was occluded with eversion technique using a Runner, broadcasting/film/video and hemostat.  We then brought a bovine pericardial patch on the field.  This was sewn onto the proximal profunda up to the common femoral with 5-0 Prolene with the help of my assistant.  This was de-aired prior to completion.  I did place several repair sutures in it with 5-0 Prolene and 6-0 Prolene.  At completion we had a brisk signal in the profunda with  a brisk signal in the anterior tibial in the foot.  Protamine was given.  We used Surgicel snow to get hemostasis.  The groin was copiously irrigated.  It was closed in multiple layers of 2-0 Vicryl, 3-0 Vicryl, 4-0 Monocryl and Dermabond.  Taken to recovery in stable condition.  Complication:  None  Condition: Stable  Young Hensen, MD [facility] Office: [phone]   Young Hensen

## 2023-08-06 NOTE — Progress Notes (Signed)
 Patient arrived at the unit,CHG bath given,vitals checked,rt groin incision site level 0,bilateral dorsalis pulse dopplered ,pt oriented to the unit

## 2023-08-06 NOTE — Progress Notes (Signed)
 PHARMACY NOTE:  ANTIMICROBIAL RENAL DOSAGE ADJUSTMENT  Current antimicrobial regimen includes a mismatch between antimicrobial dosage and estimated renal function.  As per policy approved by the Pharmacy & Therapeutics and Medical Executive Committees, the antimicrobial dosage will be adjusted accordingly.  Current antimicrobial dosage:  Vancomycin  1000 mg IV q24h x 2 doses post op.  Patent received Vancomycin  1000mg  IV preop dose on 08/06/23 at 0649AM.   Indication: Surgical prophylaxis.   Renal Function: CKD stage III Estimated Creatinine Clearance: 22.8 mL/min (A) (by C-G formula based on SCr of 1.7 mg/dL (H)). []      On intermittent HD, scheduled: []      On CRRT    Antimicrobial dosage has been changed to:  Vancomycin  500mg  IV every 24 hours x 2 doses , to start on 08/07/23 at 0700.  Additional comments:   Thank you for allowing pharmacy to be a part of this patient's care.  Alisa Irish, RPh Clinical Pharmacist 08/06/2023 4:18 PM  Please check AMION for all [facility] Pharmacy phone numbers After 10:00 PM, call Main Pharmacy [phone]

## 2023-08-06 NOTE — Anesthesia Procedure Notes (Signed)
 Procedure Name: Intubation Date/Time: 08/06/2023 8:08 AM  Performed by: Hebert Littler, CRNAPre-anesthesia Checklist: Patient identified, Emergency Drugs available, Suction available, Patient being monitored and Timeout performed Patient Re-evaluated:Patient Re-evaluated prior to induction Oxygen Delivery Method: Circle system utilized Preoxygenation: Pre-oxygenation with 100% oxygen Induction Type: IV induction Ventilation: Mask ventilation without difficulty and Oral airway inserted - appropriate to patient size Laryngoscope Size: Glidescope and 3 Grade View: Grade I Tube size: 7.0 mm Number of attempts: 1 Airway Equipment and Method: Video-laryngoscopy, Stylet and Patient positioned with wedge pillow Placement Confirmation: ETT inserted through vocal cords under direct vision, positive ETCO2, CO2 detector and breath sounds checked- equal and bilateral Secured at: 22 cm Tube secured with: Tape

## 2023-08-06 NOTE — Progress Notes (Signed)
  Progress Note    08/06/2023 4:19 PM * Day of Surgery *  Subjective:  right groin sore but otherwise feeling okay. No pain in right foot   Vitals:   08/06/23 1530 08/06/23 1548  BP: 107/69 107/69  Pulse: 70   Resp: 17   Temp:    SpO2:     Physical Exam: Cardiac:  regular Lungs:  non labored 2L [place] Incisions:  Right groin incision is clean, dry and intact without swelling or hematoma Extremities:  RLE well perfused and warm with doppler AT, faint DP and PT signals Abdomen:  soft Neurologic: alert and oriented  CBC    Component Value Date/Time   WBC 7.7 08/06/2023 1025   RBC 3.72 (L) 08/06/2023 1025   HGB 11.3 (L) 08/06/2023 1025   HGB 14.9 02/17/2013 1321   HCT 35.6 (L) 08/06/2023 1025   HCT 43.5 02/17/2013 1321   PLT 240 08/06/2023 1025   PLT 275 02/17/2013 1321   MCV 95.7 08/06/2023 1025   MCV 91 02/17/2013 1321   MCH 30.4 08/06/2023 1025   MCHC 31.7 08/06/2023 1025   RDW 14.6 08/06/2023 1025   RDW 14.2 02/17/2013 1321   LYMPHSABS 1.4 12/14/2017 0700   LYMPHSABS 2.4 02/17/2013 1321   MONOABS 1.1 (H) 12/14/2017 0700   EOSABS 0.4 12/14/2017 0700   EOSABS 0.3 02/17/2013 1321   BASOSABS 0.1 12/14/2017 0700   BASOSABS 0.1 02/17/2013 1321    BMET    Component Value Date/Time   NA 135 07/27/2023 1344   K 5.0 07/27/2023 1344   CL 101 07/27/2023 1344   CO2 24 07/27/2023 1344   GLUCOSE 99 07/27/2023 1344   BUN 33 (H) 07/27/2023 1344   CREATININE 1.70 (H) 08/06/2023 1025   CALCIUM 9.1 07/27/2023 1344   GFRNONAA 30 (L) 08/06/2023 1025   GFRAA >60 12/17/2017 1518    INR    Component Value Date/Time   INR 1.0 07/27/2023 1344     Intake/Output Summary (Last 24 hours) at 08/06/2023 1619 Last data filed at 08/06/2023 1245 Gross per 24 hour  Intake 2950 ml  Output 475 ml  Net 2475 ml     Assessment/Plan:  83 y.o. female is s/p Right common femoral endarterectomy with profundoplasty and bovine pericardial patch angioplasty  * Day of Surgery *   Right  groin incision is c/d/I without swelling or hematoma Doppler right AT, faint DP/ PT signals Pain well controlled Hemodynamically stable Continue routine post op care   Deneen Finical, PA-C [facility] [phone] 08/06/2023 4:19 PM

## 2023-08-06 NOTE — Transfer of Care (Signed)
 Immediate Anesthesia Transfer of Care Note  Patient: Regina [name]  Procedure(s) Performed: ENDARTERECTOMY, FEMORAL, RIGHT (Right) PROFUNDOPLASTY, USING BOVINE PATCH GRAFT, RIGHT (Right)  Patient Location: PACU  Anesthesia Type:General  Level of Consciousness: oriented, drowsy, and patient cooperative  Airway & Oxygen Therapy: Patient Spontanous Breathing and Patient connected to face mask oxygen  Post-op Assessment: Report given to RN, Post -op Vital signs reviewed and stable, Patient moving all extremities, Patient moving all extremities X 4, and Patient able to stick tongue midline  Post vital signs: Reviewed and stable  Last Vitals:  Vitals Value Taken Time  BP 139/65 08/06/23 1027  Temp    Pulse 90 08/06/23 1030  Resp 22 08/06/23 1030  SpO2 92 % 08/06/23 1030  Vitals shown include unfiled device data.  Last Pain:  Vitals:   08/06/23 0620  PainSc: 0-No pain         Complications: No notable events documented.

## 2023-08-06 NOTE — Anesthesia Postprocedure Evaluation (Signed)
 Anesthesia Post Note  Patient: Regina [name]  Procedure(s) Performed: ENDARTERECTOMY, FEMORAL, RIGHT (Right) PROFUNDOPLASTY, USING BOVINE PATCH GRAFT, RIGHT (Right)     Patient location during evaluation: PACU Anesthesia Type: General Level of consciousness: awake Pain management: pain level controlled Vital Signs Assessment: post-procedure vital signs reviewed and stable Respiratory status: spontaneous breathing, nonlabored ventilation and respiratory function stable Cardiovascular status: blood pressure returned to baseline and stable Postop Assessment: no apparent nausea or vomiting Anesthetic complications: no   No notable events documented.  Last Vitals:  Vitals:   08/06/23 1145 08/06/23 1200  BP: (!) 108/47 (!) 102/52  Pulse: 62 60  Resp: 16 16  Temp:    SpO2: 95% 95%    Last Pain:  Vitals:   08/06/23 1200  PainSc: Asleep                 Conard Decent

## 2023-08-06 NOTE — H&P (Signed)
 History and Physical Interval Note:  08/06/2023 7:31 AM  Regina [name]  has presented today for surgery, with the diagnosis of CLI RLE.  The various methods of treatment have been discussed with the patient and family. After consideration of risks, benefits and other options for treatment, the patient has consented to  Procedure(s): ENDARTERECTOMY, FEMORAL (Right) BYPASS GRAFT FEMORAL-POPLITEAL ARTERY (Right) as a surgical intervention.  The patient's history has been reviewed, patient examined, no change in status, stable for surgery.  I have reviewed the patient's chart and labs.  Questions were answered to the patient's satisfaction.    Plan right femoral endarterectomy.  Will likely delay bypass as her foot is feeling better.  CLI with rest pain.  Young Hensen     Patient name: Regina [name] MRN: [id number]        DOB: 11/18/1940          Sex: female   REASON FOR CONSULT: PAD   HPI: Regina [name] is a 83 y.o. female, with history of hypertension, hyperlipidemia, CKD stage 3 that presents for evaluation of PAD.  She describes pain in both of her legs ongoing for the last 3 years.  This is now worse in the right leg.  She describes pain in the calf and foot that wakes her up at night.  No prior vascular interventions.  She is here with her husband.  They moved here from [place] .   She did have ABIs on 06/22/2023 that were 0.6 on the left monophasic and 0.4 on the right monophasic.  Also had bilateral lower extremity arterial duplex showing 50 to 74% stenosis of the right common femoral artery with a right SFA occlusion and a right PT occlusion and also a high-grade 75 to 99% left popliteal stenosis with left PT occlusion.       Past Medical History:  Diagnosis Date   Anxiety     Arthritis      neck   Asthma     Cervical spondylolysis     Cervical spondylolysis     Depression     Hashimoto's disease     Hyperlipidemia     Hypertension      Insulin  resistance     PONV (postoperative nausea and vomiting)      hasn't had surgery since 1993               Past Surgical History:  Procedure Laterality Date   ANTERIOR CERVICAL DECOMP/DISCECTOMY FUSION N/A 12/06/2017    Procedure: ANTERIOR CERVICAL DECOMPRESSION/DISCECTOMY FUSION, CERVICAL 3- CERVICAL 4, CERVICAL 4- CERVICAL 5, CERVICAL 5- CERVICAL 6;  Surgeon: Augusto Blonder, MD;  Location: [facility] OR;  Service: Neurosurgery;  Laterality: N/A;  ANTERIOR CERVICAL DECOMPRESSION/DISCECTOMY FUSION, CERVICAL THREE- CERVICAL FOUR, CERVICAL FOUR- CERVICAL FIVE, CERVICAL FIVE- CERVICAL SIX   APPENDECTOMY       BREAST SURGERY        left biopsy   CESAREAN SECTION        x2   CHOLECYSTECTOMY       DENTAL SURGERY        teeth removal   OVARY SURGERY        ovarian wedge   TONSILLECTOMY                   Family History  Problem Relation Age of Onset   COPD Mother     Asthma Father     Heart disease Father     Asthma Paternal Grandfather  SOCIAL HISTORY: Social History         Socioeconomic History   Marital status: Married      Spouse name: Not on file   Number of children: Not on file   Years of education: Not on file   Highest education level: Not on file  Occupational History   Not on file  Tobacco Use   Smoking status: Former      Current packs/day: 0.00      Types: Cigarettes      Quit date: 61      Years since quitting: 43.2   Smokeless tobacco: Never   Tobacco comments:      Smoked [age]ish, 1-2 ppd  Vaping Use   Vaping status: Never Used  Substance and Sexual Activity   Alcohol use: Yes      Comment: very rare   Drug use: Never   Sexual activity: Not on file  Other Topics Concern   Not on file  Social History Narrative    Right handed    One story home    Drinks cafffeine    Social Drivers of Health    Financial Resource Strain: Not on file  Food Insecurity: Not on file  Transportation Needs: Not on file  Physical Activity:  Not on file  Stress: Not on file  Social Connections: Not on file  Intimate Partner Violence: Not on file      Allergies       Allergies  Allergen Reactions   Ceclor [Cefaclor] Rash   Naproxen Hives and Swelling      Mouth swelling   Sulfa Antibiotics Nausea And Vomiting and Other (See Comments)      Syncope/headaches    Amlodipine Cough   Augmentin [Amoxicillin-Pot Clavulanate] Nausea And Vomiting      Has patient had a PCN reaction causing immediate rash, facial/tongue/throat swelling, SOB or lightheadedness with hypotension: No Has patient had a PCN reaction causing severe rash involving mucus membranes or skin necrosis: No Has patient had a PCN reaction that required hospitalization: No Has patient had a PCN reaction occurring within the last 10 years: No If all of the above answers are "NO", then may proceed with Cephalosporin use.     Other Nausea And Vomiting      "pain medications" or opoids/Anesthesia   Valsartan Cough              Current Outpatient Medications  Medication Sig Dispense Refill   acetaminophen  (TYLENOL ) 325 MG tablet Take 1-2 tablets (325-650 mg total) by mouth every 4 (four) hours as needed for mild pain.       albuterol  (PROVENTIL  HFA;VENTOLIN  HFA) 108 (90 Base) MCG/ACT inhaler Inhale 1-2 puffs into the lungs every 6 (six) hours as needed for wheezing or shortness of breath (asthma/coughing).       ALPRAZolam  (XANAX ) 0.25 MG tablet Take 1 tablet (0.25 mg total) by mouth 3 (three) times daily as needed for anxiety. 30 tablet 0   atorvastatin (LIPITOR) 20 MG tablet Take 20 mg by mouth daily.       azithromycin  (ZITHROMAX ) 250 MG tablet Take 1 tablet (250 mg total) by mouth daily. 4 tablet 0   budesonide -formoterol  (SYMBICORT ) 160-4.5 MCG/ACT inhaler Inhale 2 puffs into the lungs every 12 (twelve) hours. 1 Inhaler 0   escitalopram  (LEXAPRO ) 10 MG tablet Take 1 tablet (10 mg total) by mouth at bedtime. 30 tablet 0   ferrous sulfate 325 (65 FE) MG EC  tablet Take 325 mg by  mouth daily.       furosemide  (LASIX ) 20 MG tablet Take 20 mg by mouth daily.       levothyroxine  (SYNTHROID ) 75 MCG tablet Take 1 tablet (75 mcg total) by mouth daily before breakfast. 30 tablet 0   lisinopril  (ZESTRIL ) 10 MG tablet Take 10 mg by mouth daily.       metaxalone  (SKELAXIN ) 800 MG tablet Take 1 tablet (800 mg total) by mouth 3 (three) times daily as needed for muscle spasms. 60 tablet 0   metFORMIN (GLUCOPHAGE) 850 MG tablet Take 850 mg by mouth 2 (two) times daily.        montelukast  (SINGULAIR ) 10 MG tablet Take 1 tablet (10 mg total) by mouth at bedtime. 30 tablet 3   omeprazole (PRILOSEC) 40 MG capsule TAKE 1 CAPSULE BY MOUTH ONCE DAILY 30 MINUTES BEFORE MORNING MEAL       senna-docusate (SENOKOT-S) 8.6-50 MG tablet Take 1 tablet by mouth 2 (two) times daily.       triamterene-hydrochlorothiazide (MAXZIDE-25) 37.5-25 MG tablet         VIRTUSSIN A/C 100-10 MG/5ML syrup TAKE 5 ML BY MOUTH EVERY 6 HOURS AS NEEDED FOR COUGH FOR 5 DAYS       vitamin B-12 (CYANOCOBALAMIN) 100 MCG tablet Take 100 mcg by mouth daily.       Vitamin D, Ergocalciferol, (DRISDOL) 1.25 MG (50000 UT) CAPS capsule TAKE 1 CAPSULE BY MOUTH ONCE A WEEK FOR 8 WEEKS WHEN FINISHED CAN TAKE 1000 UNITS OVER THE COUNTER DAILY          No current facility-administered medications for this visit.        REVIEW OF SYSTEMS:  [X]  denotes positive finding, [ ]  denotes negative finding Cardiac   Comments:  Chest pain or chest pressure:      Shortness of breath upon exertion:      Short of breath when lying flat:      Irregular heart rhythm:             Vascular      Pain in calf, thigh, or hip brought on by ambulation:      Pain in feet at night that wakes you up from your sleep:  x Right foot   Blood clot in your veins:      Leg swelling:              Pulmonary      Oxygen at home:      Productive cough:       Wheezing:              Neurologic      Sudden weakness in arms or legs:        Sudden numbness in arms or legs:       Sudden onset of difficulty speaking or slurred speech:      Temporary loss of vision in one eye:       Problems with dizziness:              Gastrointestinal      Blood in stool:       Vomited blood:              Genitourinary      Burning when urinating:       Blood in urine:             Psychiatric      Major depression:  Hematologic      Bleeding problems:      Problems with blood clotting too easily:             Skin      Rashes or ulcers:             Constitutional      Fever or chills:          PHYSICAL EXAM:    Vitals:    07/06/23 1016  BP: (!) 141/80  Pulse: 70  Resp: 18  Temp: 97.9 F (36.6 C)  TempSrc: Temporal  SpO2: 97%  Weight: 143 lb 12.8 oz (65.2 kg)  Height: 5\' 2"  (1.575 m)      GENERAL: The patient is a well-nourished female, in no acute distress. The vital signs are documented above. CARDIAC: There is a regular rate and rhythm.  VASCULAR: No tissue loss No palpable right femoral pulse Weakly palpable left femoral pulse No palpable pedal pulses No tissue loss PULMONARY: No respiratory distress. ABDOMEN: Soft and non-tender. MUSCULOSKELETAL: There are no major deformities or cyanosis. NEUROLOGIC: No focal weakness or paresthesias are detected. SKIN: There are no ulcers or rashes noted. PSYCHIATRIC: The patient has a normal affect.   DATA:    ABIs on 06/22/2023 that were 0.6 on the left monophasic and 0.4 on the right monophasic.     Bilateral lower extremity arterial duplex 06/22/23 showing 50 to 74% stenosis of the right common femoral artery with a right SFA occlusion and a right PT occlusion and also a high-grade 75 to 99% left popliteal stenosis with left PT occlusion.   Assessment/Plan:    83 y.o. female, with history of hypertension, hyperlipidemia, CKD stage 3 that presents for evaluation of PAD.  She describes pain in both of her legs ongoing for the last 3 years.  This is now  worse in the right leg.  She describes pain in the calf and foot that wakes her up at night.  Discussed her symptoms are consistent with critical limb ischemia now with rest pain.  Her ABIs on the right were 0.4.  She does have a moderate right common femoral stenosis with an SFA occlusion.  I have recommended angiogram lower extremity arteriogram with a focus on the right leg from a left femoral approach.  Will also get left leg runoff as she does have some symptoms in the left leg as well.  Will get this scheduled in the Cath Lab.  Discussed we will try and use CO2 to limit contrast but will require some limited contrast.  Will also give her sedation given she does have history of vasovagal events and gets very nervous during procedures.  Discussed with her common femoral disease on the right ultimately may require open surgical intervention with femoral endarterectomy and possible bypass.     Young Hensen, MD [facility] [place] Office: [phone]

## 2023-08-07 ENCOUNTER — Encounter (HOSPITAL_COMMUNITY): Payer: Self-pay | Admitting: Vascular Surgery

## 2023-08-07 LAB — HEMOGLOBIN A1C
Hgb A1c MFr Bld: 5.9 % — ABNORMAL HIGH (ref 4.8–5.6)
Mean Plasma Glucose: 123 mg/dL

## 2023-08-07 LAB — GLUCOSE, CAPILLARY
Glucose-Capillary: 99 mg/dL (ref 70–99)
Glucose-Capillary: 98 mg/dL (ref 70–99)
Glucose-Capillary: 111 mg/dL — ABNORMAL HIGH (ref 70–99)
Glucose-Capillary: 100 mg/dL — ABNORMAL HIGH (ref 70–99)

## 2023-08-07 LAB — CBC
HCT: 31.2 % — ABNORMAL LOW (ref 36.0–46.0)
Hemoglobin: 10.5 g/dL — ABNORMAL LOW (ref 12.0–15.0)
MCH: 31.4 pg (ref 26.0–34.0)
MCHC: 33.7 g/dL (ref 30.0–36.0)
MCV: 93.4 fL (ref 80.0–100.0)
Platelets: 217 10*3/uL (ref 150–400)
RBC: 3.34 MIL/uL — ABNORMAL LOW (ref 3.87–5.11)
RDW: 14.6 % (ref 11.5–15.5)
WBC: 8.6 10*3/uL (ref 4.0–10.5)
nRBC: 0 % (ref 0.0–0.2)

## 2023-08-07 LAB — LIPID PANEL
Cholesterol: 102 mg/dL (ref 0–200)
HDL: 48 mg/dL (ref >40)
LDL Cholesterol: 40 mg/dL (ref 0–99)
Total CHOL/HDL Ratio: 2.1 ratio
Triglycerides: 72 mg/dL (ref <150)
VLDL: 14 mg/dL (ref 0–40)

## 2023-08-07 LAB — BASIC METABOLIC PANEL WITH GFR
Anion gap: 8 (ref 5–15)
BUN: 25 mg/dL — ABNORMAL HIGH (ref 8–23)
CO2: 19 mmol/L — ABNORMAL LOW (ref 22–32)
Calcium: 9 mg/dL (ref 8.9–10.3)
Chloride: 107 mmol/L (ref 98–111)
Creatinine, Ser: 1.69 mg/dL — ABNORMAL HIGH (ref 0.44–1.00)
GFR, Estimated: 30 mL/min — ABNORMAL LOW (ref >60)
Glucose, Bld: 119 mg/dL — ABNORMAL HIGH (ref 70–99)
Potassium: 4.6 mmol/L (ref 3.5–5.1)
Sodium: 134 mmol/L — ABNORMAL LOW (ref 135–145)

## 2023-08-07 MED ORDER — TRAMADOL HCL 50 MG PO TABS
50.0000 mg | ORAL_TABLET | Freq: Four times a day (QID) | ORAL | Status: DC | PRN
  Administered 2023-08-07 (×3): 50 mg via ORAL
  Filled 2023-08-07 (×3): qty 1

## 2023-08-07 MED ORDER — ONDANSETRON 4 MG PO TBDP
4.0000 mg | ORAL_TABLET | Freq: Three times a day (TID) | ORAL | Status: DC | PRN
  Administered 2023-08-07: 4 mg via ORAL
  Filled 2023-08-07: qty 1

## 2023-08-07 NOTE — Progress Notes (Addendum)
 [facility] [place]  Subjective  - walked yesterday now with incisional pain.   Objective (!) 104/52 68 98.5 F (36.9 C) (Oral) 19 94%  Intake/Output Summary (Last 24 hours) at 08/07/2023 2952 Last data filed at 08/07/2023 0601 Gross per 24 hour  Intake 3422.07 ml  Output 1025 ml  Net 2397.07 ml   Right groin incision healing well without hematoma  Doppler signal AT/PT intact left LE, M/S grossly intact Lungs non labored breathing General no acute distress   Assessment/Planning: POD # 1 83 y.o. female is s/p Right common femoral endarterectomy with profundoplasty and bovine pericardial patch angioplasty   Stable inflow with good doppler signals I will add tramadol and Zofran  for pain control and nausea Possible discharge tomorrow Cont mobility    Rocky Cipro 08/07/2023 7:11 AM --  Laboratory Lab Results: Recent Labs    08/06/23 1025 08/07/23 0034  WBC 7.7 8.6  HGB 11.3* 10.5*  HCT 35.6* 31.2*  PLT 240 217   BMET Recent Labs    08/06/23 0949 08/06/23 1025 08/07/23 0034  NA 138  --  134*  K 4.1  --  4.6  CL  --   --  107  CO2  --   --  19*  GLUCOSE  --   --  119*  BUN  --   --  25*  CREATININE  --  1.70* 1.69*  CALCIUM  --   --  9.0    COAG Lab Results  Component Value Date   INR 1.0 07/27/2023   No results found for: "PTT"  I have seen and evaluated the patient. I agree with the PA note as documented above.  Postop day 1 status post right common femoral endarterectomy with profundoplasty and bovine patch for critical limb ischemia with rest pain.  Groin looks good.  Brisk Doppler signals in the right foot including the AT PT.  Feels she needs 1 more day for mobility.  Work with PT today.  Overall looks great.  Aspirin  statin for risk reduction.  Young Hensen, MD [facility] [place] Office: [phone]

## 2023-08-07 NOTE — Evaluation (Signed)
 Physical Therapy Evaluation Patient Details Name: Regina [name] MRN: [id number] DOB: 1940/05/07 Today's Date: 08/07/2023  History of Present Illness  83 y.o. female that presents 08/06/23 for evaluation of PAD. RLE critical limb ischemia; 5/5 R femoral endarterectomy with angioplasty  PMH-hypertension, hyperlipidemia, CKD stage 3, Hashimoto's disease, ACDF  Clinical Impression  Pt admitted secondary to problem above with deficits below. 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 Pt currently requires supervision for safe use of RW and ambulated 110 ft. Can benefit from additional acute PT for walker safety/education. No followup PT indicated. Anticipate patient will benefit from PT to address problems listed below. Will continue to follow acutely to maximize functional mobility, independence, and safety.           If plan is discharge home, recommend the following: Help with stairs or ramp for entrance   Can travel by private vehicle        Equipment Recommendations Rolling walker (2 wheels)  Recommendations for Other Services  OT consult    Functional Status Assessment Patient has had a recent decline in their functional status and demonstrates the ability to make significant improvements in function in a reasonable and predictable amount of time.     Precautions / Restrictions Precautions Precautions: Fall Recall of Precautions/Restrictions: Intact Restrictions Weight Bearing Restrictions Per Provider Order: No      Mobility  Bed Mobility Overal bed mobility: Modified Independent             General bed mobility comments: HOB elevated; incr time/effort; able to get out and back into bed    Transfers Overall transfer level: Needs assistance Equipment used: Rolling walker (2 wheels) Transfers: Sit to/from Stand Sit to Stand: Supervision           General transfer comment: vc for backing up to surface (not  "parking" RW off to the side) and hand placement    Ambulation/Gait Ambulation/Gait assistance: Supervision Gait Distance (Feet): 110 Feet Assistive device: Rolling walker (2 wheels) Gait Pattern/deviations: Step-through pattern, Decreased stride length   Gait velocity interpretation: 1.31 - 2.62 ft/sec, indicative of limited community ambulator   General Gait Details: initial cues for proximity to RW, improved without further cues needed  Stairs            Wheelchair Mobility     Tilt Bed    Modified Rankin (Stroke Patients Only)       Balance Overall balance assessment: Mild deficits observed, not formally tested                                           Pertinent Vitals/Pain Pain Assessment Pain Assessment: 0-10 Pain Score: 7  Pain Location: rt groin Pain Descriptors / Indicators: Operative site guarding Pain Intervention(s): Limited activity within patient's tolerance, Monitored during session, Premedicated before session    Home Living Family/patient expects to be discharged to:: Private residence Living Arrangements: Spouse/significant other Available Help at Discharge: Family;Available 24 hours/day Type of Home: House (condo) Home Access: Stairs to enter Entrance Stairs-Rails: None Entrance Stairs-Number of Steps: 1   Home Layout: One level Home Equipment: Cane - single point;Grab bars - tub/shower;Rollator (4 wheels)      Prior Function Prior Level of Function : Independent/Modified Independent;Driving             Mobility Comments: uses cane when hip arthritis  acting up; cannot use rollator inside as it's "too big"; husband needs cognitive support       Extremity/Trunk Assessment   Upper Extremity Assessment Upper Extremity Assessment: Defer to OT evaluation    Lower Extremity Assessment Lower Extremity Assessment: RLE deficits/detail (painful, but moving full ROM)    Cervical / Trunk Assessment Cervical / Trunk  Assessment: Other exceptions (forward head)  Communication   Communication Communication: No apparent difficulties    Cognition Arousal: Alert Behavior During Therapy: WFL for tasks assessed/performed   PT - Cognitive impairments: No apparent impairments                         Following commands: Intact       Cueing Cueing Techniques: Verbal cues     General Comments      Exercises     Assessment/Plan    PT Assessment Patient needs continued PT services  PT Problem List Decreased activity tolerance;Decreased balance;Decreased mobility;Decreased knowledge of use of DME;Pain       PT Treatment Interventions DME instruction;Gait training;Stair training;Functional mobility training;Therapeutic activities;Therapeutic exercise;Patient/family education    PT Goals (Current goals can be found in the Care Plan section)  Acute Rehab PT Goals Patient Stated Goal: go home tomorrow PT Goal Formulation: With patient Time For Goal Achievement: 08/21/23 Potential to Achieve Goals: Good    Frequency Min 2X/week     Co-evaluation               AM-PAC PT "6 Clicks" Mobility  Outcome Measure Help needed turning from your back to your side while in a flat bed without using bedrails?: A Little Help needed moving from lying on your back to sitting on the side of a flat bed without using bedrails?: A Little Help needed moving to and from a bed to a chair (including a wheelchair)?: A Little Help needed standing up from a chair using your arms (e.g., wheelchair or bedside chair)?: A Little Help needed to walk in hospital room?: A Little Help needed climbing 3-5 steps with a railing? : A Little 6 Click Score: 18    End of Session   Activity Tolerance: Patient tolerated treatment well Patient left: in bed;with call bell/phone within reach (pt had been up in recliner already this morning and found it uncomfortable) Nurse Communication: Mobility status PT Visit  Diagnosis: Other abnormalities of gait and mobility (R26.89)    Time: 1610-9604 PT Time Calculation (min) (ACUTE ONLY): 19 min   Charges:   PT Evaluation $PT Eval Low Complexity: 1 Low   PT General Charges $$ ACUTE PT VISIT: 1 Visit          Gayle Kava, PT Acute Rehabilitation Services  Office [phone]   Guilford Leep 08/07/2023, 9:30 AM

## 2023-08-07 NOTE — Evaluation (Signed)
 Occupational Therapy Evaluation Patient Details Name: Regina [name] MRN: [id number] DOB: Jun 14, 1940 Today's Date: 08/07/2023   History of Present Illness   83 y.o. female that presents 08/06/23 for evaluation of PAD. RLE critical limb ischemia; 5/5 R femoral endarterectomy with angioplasty  PMH-hypertension, hyperlipidemia, CKD stage 3, Hashimoto's disease, ACDF     Clinical Impressions Pt presents with decline in function and safety with ADLs and ADL mobility with impaired balance and endurance. PTA pt lives at home with husband and was Ind with ADLs/selfcare, IADLs, drives, cooks, uses cane sometimes depending in R hip pain, admits that she furniture walks in the home sometimes. Pt currently requires min A with LB ADLs and Sup with transfers/mobility using RW. OT educated pt, husband and son on toilet riser, 3 in 1, shower chair and LH bath sponge for home use as well as using rollater to assist with home mgt tasks such as laundry and I the kitchen. Pt would benefit from acute OT services to address impairments to maximize level of function and safety     If plan is discharge home, recommend the following:   A little help with bathing/dressing/bathroom;A little help with walking and/or transfers;Assistance with cooking/housework;Help with stairs or ramp for entrance;Assist for transportation     Functional Status Assessment   Patient has had a recent decline in their functional status and demonstrates the ability to make significant improvements in function in a reasonable and predictable amount of time.     Equipment Recommendations   Tub/shower seat;Other (comment) (LH bath sponge)     Recommendations for Other Services         Precautions/Restrictions   Precautions Precautions: Fall Recall of Precautions/Restrictions: Intact Restrictions Weight Bearing Restrictions Per Provider Order: No     Mobility Bed Mobility Overal bed mobility: Modified  Independent                  Transfers Overall transfer level: Needs assistance Equipment used: Rolling walker (2 wheels) Transfers: Sit to/from Stand Sit to Stand: Supervision           General transfer comment: min verbal cues to not ditch RW before reaching destination, safe hand placement      Balance Overall balance assessment: Mild deficits observed, not formally tested                                         ADL either performed or assessed with clinical judgement   ADL Overall ADL's : Needs assistance/impaired Eating/Feeding: Independent   Grooming: Wash/dry hands;Wash/dry face;Oral care;Supervision/safety;Standing   Upper Body Bathing: Set up;With caregiver independent assisting   Lower Body Bathing: Minimal assistance;With caregiver independent assisting   Upper Body Dressing : Set up;With caregiver independent assisting   Lower Body Dressing: Minimal assistance;With caregiver independent assisting   Toilet Transfer: Supervision/safety;Ambulation;Rolling walker (2 wheels);Regular Toilet;Grab bars;With caregiver independent assisting;Cueing for safety   Toileting- Clothing Manipulation and Hygiene: Supervision/safety;Sit to/from stand   Tub/ Shower Transfer: Supervision/safety;Ambulation;Rolling walker (2 wheels);Shower seat;Grab bars;Cueing for safety   Functional mobility during ADLs: Supervision/safety;Rolling walker (2 wheels);Cueing for safety General ADL Comments: edcuated pt, husband and son on toilet riser, 3 in 1, shower chair and LH bath sponge for home use     Vision Baseline Vision/History: 1 Wears glasses Ability to See in Adequate Light: 0 Adequate Patient Visual Report: No change from baseline  Perception         Praxis         Pertinent Vitals/Pain Pain Assessment Pain Assessment: 0-10 Pain Score: 5  Pain Location: R groin Pain Descriptors / Indicators: Operative site guarding Pain Intervention(s):  Monitored during session, Premedicated before session, Repositioned     Extremity/Trunk Assessment Upper Extremity Assessment Upper Extremity Assessment: Overall WFL for tasks assessed   Lower Extremity Assessment Lower Extremity Assessment: Defer to PT evaluation   Cervical / Trunk Assessment Cervical / Trunk Assessment: Other exceptions (forward head)   Communication Communication Communication: No apparent difficulties   Cognition Arousal: Alert Behavior During Therapy: WFL for tasks assessed/performed                                 Following commands: Intact       Cueing  General Comments   Cueing Techniques: Verbal cues      Exercises     Shoulder Instructions      Home Living Family/patient expects to be discharged to:: Private residence Living Arrangements: Spouse/significant other Available Help at Discharge: Family;Available 24 hours/day Type of Home: House (condo) Home Access: Stairs to enter [employer] of Steps: 1 Entrance Stairs-Rails: None Home Layout: One level     Bathroom Shower/Tub: Producer, television/film/video: Standard Bathroom Accessibility: Yes   Home Equipment: Cane - single point;Grab bars - tub/shower;Rollator (4 wheels)          Prior Functioning/Environment Prior Level of Function : Independent/Modified Independent;Driving             Mobility Comments: 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; cannot use rollator inside as it's "too big"; husband needs cognitive support ADLs Comments: Ind with ADLs, IADLs, grocery shops, cooks    OT Problem List: Decreased activity tolerance;Decreased knowledge of use of DME or AE   OT Treatment/Interventions: Self-care/ADL training;Therapeutic activities;DME and/or AE instruction;Patient/family education      OT Goals(Current goals can be found in the care plan section)   Acute Rehab OT Goals Patient Stated Goal: go home OT Goal Formulation: With  patient/family Time For Goal Achievement: 08/21/23 Potential to Achieve Goals: Good ADL Goals Pt Will Perform Grooming: with set-up;with modified independence;standing Pt Will Perform Lower Body Bathing: with contact guard assist;with supervision;with adaptive equipment;with caregiver independent in assisting Pt Will Perform Lower Body Dressing: with contact guard assist;with supervision;with caregiver independent in assisting Pt Will Transfer to Toilet: with modified independence;ambulating Pt Will Perform Toileting - Clothing Manipulation and hygiene: with modified independence;sit to/from stand Pt Will Perform Tub/Shower Transfer: with modified independence;ambulating;rolling walker;shower seat;grab bars   OT Frequency:  Min 2X/week    Co-evaluation              AM-PAC OT "6 Clicks" Daily Activity     Outcome Measure Help from another person eating meals?: None Help from another person taking care of personal grooming?: A Little Help from another person toileting, which includes using toliet, bedpan, or urinal?: A Little Help from another person bathing (including washing, rinsing, drying)?: A Little Help from another person to put on and taking off regular upper body clothing?: A Little Help from another person to put on and taking off regular lower body clothing?: A Little 6 Click Score: 19   End of Session Equipment Utilized During Treatment: Gait belt;Rolling walker (2 wheels)  Activity Tolerance: Patient tolerated treatment well Patient left: in bed;with call bell/phone  within reach;with family/visitor present  OT Visit Diagnosis: Other abnormalities of gait and mobility (R26.89)                Time: 1024-1050 OT Time Calculation (min): 26 min Charges:  OT General Charges $OT Visit: 1 Visit OT Evaluation $OT Eval Low Complexity: 1 Low OT Treatments $Self Care/Home Management : 8-22 mins    Alfred Ann 08/07/2023, 12:33 PM

## 2023-08-08 ENCOUNTER — Other Ambulatory Visit (HOSPITAL_COMMUNITY): Payer: Self-pay

## 2023-08-08 LAB — GLUCOSE, CAPILLARY: Glucose-Capillary: 101 mg/dL — ABNORMAL HIGH (ref 70–99)

## 2023-08-08 MED ORDER — ONDANSETRON 4 MG PO TBDP
4.0000 mg | ORAL_TABLET | Freq: Three times a day (TID) | ORAL | 0 refills | Status: DC | PRN
  Filled 2023-08-08: qty 20, 7d supply, fill #0

## 2023-08-08 MED ORDER — TRAMADOL HCL 50 MG PO TABS
50.0000 mg | ORAL_TABLET | Freq: Four times a day (QID) | ORAL | 1 refills | Status: DC | PRN
  Filled 2023-08-08: qty 30, 7d supply, fill #0

## 2023-08-08 NOTE — Progress Notes (Addendum)
 [facility]  Subjective  - Doin much better and wants to go home.  Pain controlled with Tramadol and Zofran  for nause due to pain medication.   Objective (!) 129/52 74 98.7 F (37.1 C) (Oral) 17 90%  Intake/Output Summary (Last 24 hours) at 08/08/2023 1610 Last data filed at 08/07/2023 1600 Gross per 24 hour  Intake 700 ml  Output --  Net 700 ml   Right groin incision healing well without hematoma  Doppler signal AT/PT intact left LE, M/S grossly intact Lungs non labored breathing General no acute distress   Assessment/Planning: POD # 2 83 y.o. female is s/p Right common femoral endarterectomy with profundoplasty and bovine pericardial patch angioplasty   Stable inflow with good doppler flow Incision healing well tramadol and Zofran  for pain control and nausea  Plan to discharge home with a f/u in 2-3 weeks for incision check.  Rocky Cipro 08/08/2023 7:13 AM --  Laboratory Lab Results: Recent Labs    08/06/23 1025 08/07/23 0034  WBC 7.7 8.6  HGB 11.3* 10.5*  HCT 35.6* 31.2*  PLT 240 217   BMET Recent Labs    08/06/23 0949 08/06/23 1025 08/07/23 0034  NA 138  --  134*  K 4.1  --  4.6  CL  --   --  107  CO2  --   --  19*  GLUCOSE  --   --  119*  BUN  --   --  25*  CREATININE  --  1.70* 1.69*  CALCIUM  --   --  9.0    COAG Lab Results  Component Value Date   INR 1.0 07/27/2023   No results found for: "PTT"  I have seen and evaluated the patient. I agree with the PA note as documented above.  Right groin looks great status post right common femoral endarterectomy with profundoplasty and bovine patch.  Brisk AT PT signals in the right foot.  Plan discharge today.  Follow-up in 2 to 3 weeks.  Young Hensen, MD [facility] Office: [phone]

## 2023-08-08 NOTE — Progress Notes (Signed)
 Physical Therapy Treatment Patient Details Name: Regina [name] MRN: [id number] DOB: [birth year] Today's Date: 08/08/2023   History of Present Illness 83 y.o. female that presents 08/06/23 for evaluation of PAD. RLE critical limb ischemia; 5/5 R femoral endarterectomy with angioplasty  PMH-hypertension, hyperlipidemia, CKD stage 3, Hashimoto's disease, ACDF    PT Comments  Patient feeling more groggy today, however continues to mobilize well with RW. Instructed in walker safety and up/down single step to simulate entry into home. Patient with no further questions.     If plan is discharge home, recommend the following: Help with stairs or ramp for entrance   Can travel by private vehicle        Equipment Recommendations  None recommended by PT (daughter obtained RW for her to use)    Recommendations for Other Services       Precautions / Restrictions Precautions Precautions: Fall Recall of Precautions/Restrictions: Intact Restrictions Weight Bearing Restrictions Per Provider Order: No     Mobility  Bed Mobility Overal bed mobility: Modified Independent             General bed mobility comments: HOB elevated; incr time/effort; able to get out and back into bed    Transfers Overall transfer level: Needs assistance Equipment used: Rolling walker (2 wheels) Transfers: Sit to/from Stand Sit to Stand: Supervision           General transfer comment: cues for hand placement    Ambulation/Gait Ambulation/Gait assistance: Supervision Gait Distance (Feet): 100 Feet Assistive device: Rolling walker (2 wheels) Gait Pattern/deviations: Step-through pattern, Decreased stride length   Gait velocity interpretation: 1.31 - 2.62 ft/sec, indicative of limited community ambulator   General Gait Details: initial cues for proximity to RW, improved without further cues needed   Stairs Stairs: Yes Stairs assistance: Contact guard assist Stair Management: No rails,  Forwards, With walker Number of Stairs: 1 General stair comments: pt familiar with sequencing "up with the good..."   Wheelchair Mobility     Tilt Bed    Modified Rankin (Stroke Patients Only)       Balance Overall balance assessment: Mild deficits observed, not formally tested                                          Communication Communication Communication: No apparent difficulties  Cognition Arousal: Alert Behavior During Therapy: WFL for tasks assessed/performed   PT - Cognitive impairments: No apparent impairments                         Following commands: Intact      Cueing Cueing Techniques: Verbal cues  Exercises      General Comments        Pertinent Vitals/Pain Pain Assessment Pain Assessment: 0-10 Pain Score: 4  Pain Location: R groin Pain Descriptors / Indicators: Operative site guarding    Home Living                          Prior Function            PT Goals (current goals can now be found in the care plan section) Acute Rehab PT Goals Patient Stated Goal: go home Time For Goal Achievement: 08/21/23 Potential to Achieve Goals: Good Progress towards PT goals: Progressing toward goals    Frequency    Min  2X/week      PT Plan      Co-evaluation              AM-PAC PT "6 Clicks" Mobility   Outcome Measure  Help needed turning from your back to your side while in a flat bed without using bedrails?: None Help needed moving from lying on your back to sitting on the side of a flat bed without using bedrails?: None Help needed moving to and from a bed to a chair (including a wheelchair)?: A Little Help needed standing up from a chair using your arms (e.g., wheelchair or bedside chair)?: A Little Help needed to walk in hospital room?: A Little Help needed climbing 3-5 steps with a railing? : A Little 6 Click Score: 20    End of Session   Activity Tolerance: Patient tolerated  treatment well Patient left: in bed;with call bell/phone within reach Nurse Communication: Mobility status;Other (comment) (ok to dc from PT perspective) PT Visit Diagnosis: Other abnormalities of gait and mobility (R26.89)     Time: 1610-9604 PT Time Calculation (min) (ACUTE ONLY): 16 min  Charges:    $Gait Training: 8-22 mins PT General Charges $$ ACUTE PT VISIT: 1 Visit                      Gayle Kava, PT Acute Rehabilitation Services  Office [phone]    Guilford Leep 08/08/2023, 8:52 AM

## 2023-08-08 NOTE — Progress Notes (Signed)
 PHARMACIST LIPID MONITORING   Regina [name] is a 83 y.o. female admitted on 08/06/2023 with Critical limb ischemia of the right lower extremity with rest pain s/p Right common femoral endarterectomy with profundoplasty and bovine pericardial patch angioplasty .  Pharmacy has been consulted to optimize lipid-lowering therapy with the indication of secondary prevention for clinical ASCVD.  Recent Labs:  Lipid Panel (last 6 months):   Lab Results  Component Value Date   CHOL 102 08/07/2023   TRIG 72 08/07/2023   HDL 48 08/07/2023   CHOLHDL 2.1 08/07/2023   VLDL 14 08/07/2023   LDLCALC 40 08/07/2023    Hepatic function panel (last 6 months):   Lab Results  Component Value Date   AST 13 (L) 07/27/2023   ALT 9 07/27/2023   ALKPHOS 49 07/27/2023   BILITOT 0.8 07/27/2023    SCr (since admission):   Serum creatinine: 1.69 mg/dL (H) 60/45/40 9811 Estimated creatinine clearance: 22.9 mL/min (A)  Current therapy and lipid therapy tolerance Current lipid-lowering therapy: atorvastatin 20mg   Previous lipid-lowering therapies (if applicable): none Documented or reported allergies or intolerances to lipid-lowering therapies (if applicable): none  Assessment:   Patient prefers no changes in lipid-lowering therapy at this time due to just started  atorvastatin 20mg  in March and LDL is at goal.  Patient agrees with continuing on this dose and if tolerating then can consider increase dose in a few weeks.   Plan:    1.Statin intensity (high intensity recommended for all patients regardless of the LDL):  no changes   2.Add ezetimibe (if any one of the following):   Not indicated at this time.  3.Refer to lipid clinic:   No  4.Follow-up with:  Primary care provider - Bufford Carne, FNP  5.Follow-up labs after discharge:  No changes in lipid therapy, repeat a lipid panel in one year.       Dorene Gang, PharmD, BCPS, BCCP Clinical Pharmacist  Please check AMION for all [facility]  Pharmacy phone numbers After 10:00 PM, call Main Pharmacy [phone]

## 2023-08-08 NOTE — Discharge Instructions (Signed)
 You may shower daily.  There is skin glue over the incision it will fall off on it's own.  Gradually increase you mobility.  No driving for 1 week.  No heavy lifting for 1-2 weeks.

## 2023-08-08 NOTE — Progress Notes (Signed)
   08/08/23 1023  TOC Brief Assessment  Insurance and Status Reviewed  Patient has primary care physician Yes  Home environment has been reviewed home w/ spouse  Prior level of function: self- has RW and BSC at home  Prior/Current Home Services No current home services  Social Drivers of Health Review SDOH reviewed no interventions necessary  Readmission risk has been reviewed Yes  Transition of care needs no transition of care needs at this time    Pt stable for transition home today s/p bypass/endarterectomy. No HH therapy or DME needs noted. Family to transport home. CM received notice from [facility] liaison that [facility] office made referral for any HH needs- liaison to follow up with pt.

## 2023-08-09 NOTE — Discharge Summary (Signed)
 [facility] Discharge Summary   Patient ID:  Regina [name] MRN: [id number] DOB/AGE: 83/04/1940 83 y.o.  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 (HCC) [I70.201] Atherosclerosis of artery of extremity with rest pain (HCC) [I70.229]  Secondary Diagnoses: Past Medical History:  Diagnosis Date   Anxiety    Arthritis    neck   Asthma    Cervical spondylolysis    Chronic kidney disease    Stage IIIB   Depression    Hashimoto's disease    Hyperlipidemia    Hypertension    Insulin  resistance    PAD (peripheral artery disease) (HCC)    Polycythemia    PONV (postoperative nausea and vomiting)    hasn't had surgery since 1993   Vasovagal syncope     Procedure(s): ENDARTERECTOMY, FEMORAL, RIGHT PROFUNDOPLASTY, USING BOVINE PATCH GRAFT, RIGHT  Discharged Condition: good  HPI: 83 year old female seen with critical limb ischemia with rest pain in the right lower extremity with an ABI of 0.4. She underwent angiogram showing a high-grade right common femoral artery stenosis with also flush SFA occlusion. She presents today for right lower extremity revascularization.     Hospital Course:  Regina [name] is a 83 y.o. female is S/P  Procedure(s): ENDARTERECTOMY, FEMORAL, RIGHT PROFUNDOPLASTY, USING BOVINE PATCH GRAFT, RIGHT Post op she maintained  Right groin incision healing well without hematoma, Doppler signal AT/PT intact left LE, M/S  grossly intact.  She is ambulatory  and tolerating Po's.  She has pain issues until post op day 2.  Once her pain was controlled she was discharged in stable condition POD # 2.  Aspirin  statin for risk reduction.     Significant Diagnostic Studies: CBC Lab Results  Component Value Date   WBC 8.6 08/07/2023   HGB 10.5 (L) 08/07/2023   HCT 31.2 (L) 08/07/2023   MCV 93.4 08/07/2023   PLT 217 08/07/2023    BMET    Component Value Date/Time   NA 134 (L) 08/07/2023 0034   K 4.6 08/07/2023 0034   CL 107 08/07/2023 0034   CO2 19 (L) 08/07/2023 0034   GLUCOSE 119 (H) 08/07/2023 0034   BUN 25 (H) 08/07/2023 0034   CREATININE 1.69 (H) 08/07/2023 0034   CALCIUM 9.0 08/07/2023 0034   GFRNONAA 30 (L) 08/07/2023 0034   GFRAA >60 12/17/2017 1518   COAG Lab Results  Component Value Date   INR 1.0 07/27/2023     Disposition:  Discharge to :Home Discharge Instructions     Call MD for:  redness, tenderness, or signs of infection (pain, swelling, bleeding, redness, odor or green/yellow discharge around incision site)   Complete by: As directed    Call MD for:  severe or increased pain, loss or decreased feeling  in affected limb(s)   Complete by: As directed    Call MD for:  temperature >100.5   Complete by: As directed    Resume previous diet   Complete by: As directed       Allergies as of 08/08/2023       Reactions   Ceclor [  cefaclor] Rash   Naproxen Hives, Swelling   Mouth swelling   Sulfa Antibiotics Nausea And Vomiting, Other (See Comments)   Syncope/headaches    Amlodipine Cough   Augmentin [amoxicillin-pot Clavulanate] Nausea And Vomiting   Has patient had a PCN reaction causing immediate rash, facial/tongue/throat swelling, SOB or lightheadedness with hypotension: No Has patient had a PCN reaction causing severe rash involving mucus membranes or skin necrosis: No Has patient had a PCN reaction that required hospitalization: No Has patient had a PCN reaction occurring  within the last 10 years: No If all of the above answers are "NO", then may proceed with Cephalosporin use.   Other Nausea And Vomiting   "pain medications" or opoids/Anesthesia   Valsartan Cough        Medication List     TAKE these medications    acetaminophen  650 MG CR tablet Commonly known as: TYLENOL  Take 1,300 mg by mouth every 8 (eight) hours as needed for pain.   acetaminophen  325 MG tablet Commonly known as: TYLENOL  Take 1-2 tablets (325-650 mg total) by mouth every 4 (four) hours as needed for mild pain.   albuterol  108 (90 Base) MCG/ACT inhaler Commonly known as: VENTOLIN  HFA Inhale 1-2 puffs into the lungs every 6 (six) hours as needed for wheezing or shortness of breath (asthma/coughing).   ALPRAZolam  0.25 MG tablet Commonly known as: Xanax  Take 1 tablet (0.25 mg total) by mouth 3 (three) times daily as needed for anxiety.   aspirin  325 MG tablet Take 325 mg by mouth at bedtime.   atorvastatin 20 MG tablet Commonly known as: LIPITOR Take 20 mg by mouth daily.   azithromycin  250 MG tablet Commonly known as: ZITHROMAX  Take 1 tablet (250 mg total) by mouth daily.   budesonide -formoterol  160-4.5 MCG/ACT inhaler Commonly known as: Symbicort  Inhale 2 puffs into the lungs every 12 (twelve) hours.   cholecalciferol 25 MCG (1000 UNIT) tablet Commonly known as: VITAMIN D3 Take 1,000 Units by mouth daily.   fluticasone  50 MCG/ACT nasal spray Commonly known as: FLONASE  Place 1 spray into both nostrils daily as needed for allergies.   furosemide  20 MG tablet Commonly known as: LASIX  Take 20-40 mg by mouth See admin instructions. Take 20 mg on Monday, Wednesday, Friday, Saturday and Sunday, take 40 mg on Tuesday and Thursday   gabapentin 100 MG capsule Commonly known as: NEURONTIN Take 100 mg by mouth 2 (two) times daily.   Jardiance 10 MG Tabs tablet Generic drug: empagliflozin Take 10 mg by mouth daily.   levothyroxine  75 MCG tablet Commonly known  as: Synthroid  Take 1 tablet (75 mcg total) by mouth daily before breakfast.   lisinopril  10 MG tablet Commonly known as: ZESTRIL  Take 10 mg by mouth daily.   Magnesium  200 MG Tabs Take 200 mg by mouth daily.   metaxalone  800 MG tablet Commonly known as: SKELAXIN  Take 1 tablet (800 mg total) by mouth 3 (three) times daily as needed for muscle spasms.   omeprazole 40 MG capsule Commonly known as: PRILOSEC Take 40 mg by mouth daily as needed (acid reflux).   ondansetron  4 MG disintegrating tablet Commonly known as: ZOFRAN -ODT Take 1 tablet (4 mg total) by mouth every 8 (eight) hours as needed for nausea or vomiting.   polyethylene glycol 17 g packet Commonly known as: MIRALAX  / GLYCOLAX  Take 17 g by mouth daily.   promethazine-dextromethorphan 6.25-15 MG/5ML syrup Commonly known as: PROMETHAZINE-DM Take 2.5 mLs by mouth at bedtime as needed for cough.   senna-docusate  8.6-50 MG tablet Commonly known as: Senokot-S Take 1 tablet by mouth 2 (two) times daily. What changed:  how much to take when to take this reasons to take this   SYSTANE OP Place 1 drop into both eyes as needed (dry eyes).   traMADol 50 MG tablet Commonly known as: ULTRAM Take 1 tablet (50 mg total) by mouth every 6 (six) hours as needed (pain 4-10).       Verbal and written Discharge instructions given to the patient. Wound care per Discharge AVS  Follow-up Information     Young Hensen, MD Follow up in 2 week(s).   Specialty: Vascular Surgery Why: Office will call you to arrange your appt (sent) Contact information: [address] [place] [address] [phone]         Bufford Carne, FNP. Schedule an appointment as soon as possible for a visit in 1 week(s).   Specialty: Family Medicine Contact information: [address] [place] [address] [phone]                 Signed: Rocky Cipro 08/09/2023, 12:52 PM

## 2023-08-13 ENCOUNTER — Encounter

## 2023-08-15 ENCOUNTER — Encounter: Admitting: Physical Therapy

## 2023-08-20 ENCOUNTER — Encounter: Admitting: Physical Therapy

## 2023-08-22 ENCOUNTER — Encounter: Admitting: Physical Therapy

## 2023-08-28 ENCOUNTER — Encounter: Admitting: Physical Therapy

## 2023-09-04 ENCOUNTER — Ambulatory Visit: Attending: Vascular Surgery | Admitting: Physician Assistant

## 2023-09-04 VITALS — BP 161/66 | HR 68 | Temp 98.0°F | Resp 18 | Ht 61.5 in | Wt 148.2 lb

## 2023-09-04 DIAGNOSIS — I70221 Atherosclerosis of native arteries of extremities with rest pain, right leg: Secondary | ICD-10-CM

## 2023-09-04 NOTE — Progress Notes (Signed)
 POST OPERATIVE OFFICE NOTE    CC:  F/u for surgery  HPI:  Regina [name] is a 83 y.o. female who is here for postop visit.  She recently underwent right common femoral endarterectomy with bovine pericardial patch angioplasty on 08/06/2023 by Dr. Fulton Job.  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  She has a history of bilateral lower extremity rest pain in the feet and calves, which was worse on the right.  She returns today for follow-up.  She says that she is doing much better.  She says now she wakes up maybe 2 times a week with pain in her right foot and calf, rather than nightly.  Her pain typically lasts less than 30 minutes and has significantly decreased in severity.  She says that her left foot and leg occasionally wake her up at night.  She denies any issues with her right groin incision.   Allergies  Allergen Reactions   Ceclor [Cefaclor] Rash   Naproxen Hives and Swelling    Mouth swelling   Sulfa Antibiotics Nausea And Vomiting and Other (See Comments)    Syncope/headaches    Amlodipine Cough   Augmentin [Amoxicillin-Pot Clavulanate] Nausea And Vomiting    Has patient had a PCN reaction causing immediate rash, facial/tongue/throat swelling, SOB or lightheadedness with hypotension: No Has patient had a PCN reaction causing severe rash involving mucus membranes or skin necrosis: No Has patient had a PCN reaction that required hospitalization: No Has patient had a PCN reaction occurring within the last 10 years: No If all of the above answers are "NO", then may proceed with Cephalosporin use.    Other Nausea And Vomiting    "pain medications" or opoids/Anesthesia   Valsartan Cough    Current Outpatient Medications  Medication Sig Dispense Refill   acetaminophen  (TYLENOL ) 325 MG tablet Take 1-2 tablets (325-650 mg total) by mouth every 4 (four) hours as needed for mild pain.     acetaminophen  (TYLENOL ) 650 MG CR tablet Take 1,300 mg by mouth every 8  (eight) hours as needed for pain.     albuterol  (PROVENTIL  HFA;VENTOLIN  HFA) 108 (90 Base) MCG/ACT inhaler Inhale 1-2 puffs into the lungs every 6 (six) hours as needed for wheezing or shortness of breath (asthma/coughing).     ALPRAZolam  (XANAX ) 0.25 MG tablet Take 1 tablet (0.25 mg total) by mouth 3 (three) times daily as needed for anxiety. 30 tablet 0   aspirin  325 MG tablet Take 325 mg by mouth at bedtime.     atorvastatin  (LIPITOR) 20 MG tablet Take 20 mg by mouth daily.     azithromycin  (ZITHROMAX ) 250 MG tablet Take 1 tablet (250 mg total) by mouth daily. 4 tablet 0   budesonide -formoterol  (SYMBICORT ) 160-4.5 MCG/ACT inhaler Inhale 2 puffs into the lungs every 12 (twelve) hours. 1 Inhaler 0   cholecalciferol (VITAMIN D3) 25 MCG (1000 UNIT) tablet Take 1,000 Units by mouth daily.     fluticasone  (FLONASE ) 50 MCG/ACT nasal spray Place 1 spray into both nostrils daily as needed for allergies.     furosemide  (LASIX ) 20 MG tablet Take 20-40 mg by mouth See admin instructions. Take 20 mg on Monday, Wednesday, Friday, Saturday and Sunday, take 40 mg on Tuesday and Thursday     gabapentin  (NEURONTIN ) 100 MG capsule Take 100 mg by mouth 2 (two) times daily.     JARDIANCE  10 MG TABS tablet Take 10 mg by mouth daily.     levothyroxine  (SYNTHROID ) 75 MCG tablet Take  1 tablet (75 mcg total) by mouth daily before breakfast. 30 tablet 0   lisinopril  (ZESTRIL ) 10 MG tablet Take 10 mg by mouth daily.     Magnesium  200 MG TABS Take 200 mg by mouth daily.     metaxalone  (SKELAXIN ) 800 MG tablet Take 1 tablet (800 mg total) by mouth 3 (three) times daily as needed for muscle spasms. (Patient taking differently: Take 800 mg by mouth daily as needed for muscle spasms.) 60 tablet 0   omeprazole (PRILOSEC) 40 MG capsule Take 40 mg by mouth daily as needed (acid reflux).     ondansetron  (ZOFRAN -ODT) 4 MG disintegrating tablet Take 1 tablet (4 mg total) by mouth every 8 (eight) hours as needed for nausea or vomiting.  20 tablet 0   Polyethyl Glycol-Propyl Glycol (SYSTANE OP) Place 1 drop into both eyes as needed (dry eyes).     polyethylene glycol (MIRALAX  / GLYCOLAX ) 17 g packet Take 17 g by mouth daily.     promethazine-dextromethorphan (PROMETHAZINE-DM) 6.25-15 MG/5ML syrup Take 2.5 mLs by mouth at bedtime as needed for cough.     senna-docusate (SENOKOT-S) 8.6-50 MG tablet Take 1 tablet by mouth 2 (two) times daily. (Patient taking differently: Take 3 tablets by mouth daily as needed for mild constipation or moderate constipation.)     traMADol  (ULTRAM ) 50 MG tablet Take 1 tablet (50 mg total) by mouth every 6 (six) hours as needed (pain 4-10). (Patient not taking: Reported on 09/04/2023) 30 tablet 1   No current facility-administered medications for this visit.     ROS:  See HPI  Physical Exam:  Incision: Right groin incision well-healed without erythema, dehiscence, or drainage Extremities: Brisk right AT/PT Doppler signals Neuro: intact motor and sensation of right foot    Assessment/Plan:  This is a 83 y.o. female who is here for a post op visit  -The patient recently underwent right common femoral endarterectomy for critical limb ischemia with rest pain, right greater than left -Her right groin incision is well-healed without signs of infection or hematoma -Her right lower extremity is well-perfused with brisk AT and PT Doppler signals - She reports greatly improved rest pain in the right lower extremity.  She says that she now only wakes up 1-2 times weekly with pain in the right foot and calf, and this pain is also significantly less severe. She reports continued, intermittent pain in her left foot and calf that will wake her up as well - She will continue her daily aspirin  and statin.  She can follow-up with Dr. Fulton Job in 4 to 6 weeks with right lower extremity arterial duplex and ABIs.  She may tentatively require left lower extremity intervention for rest pain   Deneise Finlay,  PA-C [facility] [phone]   Clinic MD:  Edgardo Goodwill

## 2023-09-06 ENCOUNTER — Other Ambulatory Visit: Payer: Self-pay | Admitting: *Deleted

## 2023-09-06 DIAGNOSIS — T82898A Other specified complication of vascular prosthetic devices, implants and grafts, initial encounter: Secondary | ICD-10-CM

## 2023-09-06 DIAGNOSIS — I70221 Atherosclerosis of native arteries of extremities with rest pain, right leg: Secondary | ICD-10-CM

## 2023-10-02 ENCOUNTER — Telehealth: Payer: Self-pay

## 2023-10-02 NOTE — Telephone Encounter (Signed)
 Patient called reorting pain and tightness so intense in her right thigh it caused her to pass out 09/30/2023. Reviewed patient with Corrie Baglia and the patients arterial duplex and ABIs moved up to 10/04/2023 and MD appointment moved up to 10/09/2023.  The patient was given appointment dates and times.  The patient was also instructed to go to the ER if she experiences increase in pain, limb color change or changes in limb temperature.  The patient verbalized understanding of these instructions.

## 2023-10-03 ENCOUNTER — Ambulatory Visit (INDEPENDENT_AMBULATORY_CARE_PROVIDER_SITE_OTHER): Admitting: Pulmonary Disease

## 2023-10-03 ENCOUNTER — Encounter: Payer: Self-pay | Admitting: Pulmonary Disease

## 2023-10-03 VITALS — BP 123/71 | HR 67 | Ht 61.0 in | Wt 148.0 lb

## 2023-10-03 DIAGNOSIS — R053 Chronic cough: Secondary | ICD-10-CM

## 2023-10-03 MED ORDER — BREZTRI AEROSPHERE 160-9-4.8 MCG/ACT IN AERO: INHALATION_SPRAY | RESPIRATORY_TRACT | Status: DC

## 2023-10-03 MED ORDER — ALBUTEROL SULFATE HFA 108 (90 BASE) MCG/ACT IN AERS: 2.0000 | INHALATION_SPRAY | Freq: Four times a day (QID) | RESPIRATORY_TRACT | 11 refills | Status: AC | PRN

## 2023-10-03 MED ORDER — MONTELUKAST SODIUM 10 MG PO TABS: 10.0000 mg | ORAL_TABLET | Freq: Every day | ORAL | 3 refills | Status: DC

## 2023-10-03 NOTE — Patient Instructions (Signed)
 It is nice to meet you  I think your symptoms are consistent with chronic bronchitis.  This could be due to your history of cigarette smoking although it is a long time ago.  Or could be related to poorly controlled asthma symptoms.  I know you have had a history of this worse in the past.  To aggressively treat this take Breztri 2 puffs in the morning and 2 puffs in the evening.  Rinse your mouth out with water after every use.  This is more aggressive, a stronger treatment than the Symbicort  that has not helped as much in the past.  Stop Allegra, try Zyrtec 1 tablet at night before bed.  In addition, take montelukast  1 tablet at night before bed.  These are 2 different medicines to treat nasal allergies that hopefully will help with mucus production and decrease cough.  Return to clinic in 3 months or sooner as needed with Dr. Annella

## 2023-10-03 NOTE — Progress Notes (Signed)
 @Patient  ID: Regina [name], female    DOB: [birth year], 83 y.o.   MRN: [id number]  Chief Complaint  Patient presents with   Consult    Pt states was Dx past of asthma     Referring provider: Dyane Anthony RAMAN, FNP  HPI:   83 y.o. woman whom are seen for evaluation of chronic cough.  Multiple prior pulmonary notes including NP and physician notes in 2020 reviewed.  Overall doing okay.  She has had nagging cough for a long time.  Really daily productive cough for years.  Consistent with chronic bronchitis.  Diagnosed with asthma as a young child needs to be more symptomatic terms of shortness of breath and cough.  Gradually improved with time.  But cough is persisted.  She did smoke, age [age], average 1 pack/day.  Cough is productive.  Clear phlegm.  Worse in mornings and evenings.  Worse with seasonal changes, environmental changes prickly with pollen.  No position awakenings better or worse.  Gets better with albuterol .  No other alleviating or exacerbating factors.  Notes prior inhalers did not help.  Notably she was prescribed Symbicort  by this pulmonary practice in the past.  She did not think it helped.  She does have rhinorrhea and postnasal drip symptoms.  Again usually worsen with pollen season.  Has been taking Allegra for a long time.  Does not think it really helps.  We discussed medication changes including trialing different inhalers as well as altering rhinorrhea, antihistamine and other medications, pills to help.  Notably, PFTs 03/2019 reviewed which demonstrates normal spirometry, air trapping on lung volumes, DLCO elevated.  Chest x-ray, most recent chest imaging, 06/2023 clear with hyperinflation on my review and interpretation.  Questionaires / Pulmonary Flowsheets:   ACT:      No data to display          MMRC:     No data to display          Epworth:      No data to display          Tests:   FENO:  No results found for:  NITRICOXIDE  PFT:    Latest Ref Rng & Units 03/10/2019    2:04 PM  PFT Results  FVC-Pre L 2.53   FVC-Predicted Pre % 105   Pre FEV1/FVC % % 78   FEV1-Pre L 1.98   FEV1-Predicted Pre % 111   DLCO uncorrected ml/min/mmHg 21.90   DLCO UNC% % 126   DLVA Predicted % 150   Personally reviewed and interpreted as normal spirometry, air trapping via lung volumes, DLCO within normal limits, elevated.  WALK:      No data to display          Imaging: Personally reviewed and as per EMR discussion in this note No results found.  Lab Results: Personally reviewed CBC    Component Value Date/Time   WBC 8.6 08/07/2023 0034   RBC 3.34 (L) 08/07/2023 0034   HGB 10.5 (L) 08/07/2023 0034   HGB 14.9 02/17/2013 1321   HCT 31.2 (L) 08/07/2023 0034   HCT 43.5 02/17/2013 1321   PLT 217 08/07/2023 0034   PLT 275 02/17/2013 1321   MCV 93.4 08/07/2023 0034   MCV 91 02/17/2013 1321   MCH 31.4 08/07/2023 0034   MCHC 33.7 08/07/2023 0034   RDW 14.6 08/07/2023 0034   RDW 14.2 02/17/2013 1321   LYMPHSABS 1.4 12/14/2017 0700   LYMPHSABS 2.4 02/17/2013 1321  MONOABS 1.1 (H) 12/14/2017 0700   EOSABS 0.4 12/14/2017 0700   EOSABS 0.3 02/17/2013 1321   BASOSABS 0.1 12/14/2017 0700   BASOSABS 0.1 02/17/2013 1321    BMET    Component Value Date/Time   NA 134 (L) 08/07/2023 0034   K 4.6 08/07/2023 0034   CL 107 08/07/2023 0034   CO2 19 (L) 08/07/2023 0034   GLUCOSE 119 (H) 08/07/2023 0034   BUN 25 (H) 08/07/2023 0034   CREATININE 1.69 (H) 08/07/2023 0034   CALCIUM  9.0 08/07/2023 0034   GFRNONAA 30 (L) 08/07/2023 0034   GFRAA >60 12/17/2017 1518    BNP No results found for: BNP  ProBNP No results found for: PROBNP  Specialty Problems       Pulmonary Problems   Asthma   Chronic cough    Allergies  Allergen Reactions   Ceclor [Cefaclor] Rash   Naproxen Hives and Swelling    Mouth swelling   Sulfa Antibiotics Nausea And Vomiting and Other (See Comments)     Syncope/headaches    Amlodipine Cough   Augmentin [Amoxicillin-Pot Clavulanate] Nausea And Vomiting    Has patient had a PCN reaction causing immediate rash, facial/tongue/throat swelling, SOB or lightheadedness with hypotension: No Has patient had a PCN reaction causing severe rash involving mucus membranes or skin necrosis: No Has patient had a PCN reaction that required hospitalization: No Has patient had a PCN reaction occurring within the last 10 years: No If all of the above answers are NO, then may proceed with Cephalosporin use.    Other Nausea And Vomiting    pain medications or opoids/Anesthesia   Valsartan Cough    Immunization History  Administered Date(s) Administered   Influenza, High Dose Seasonal PF 12/07/2017, 01/27/2019   PFIZER(Purple Top)SARS-COV-2 Vaccination 04/22/2019, 05/13/2019, 01/17/2020    Past Medical History:  Diagnosis Date   Anxiety    Arthritis    neck   Asthma    Cervical spondylolysis    Chronic kidney disease    Stage IIIB   Depression    Hashimoto's disease    Hyperlipidemia    Hypertension    Insulin  resistance    PAD (peripheral artery disease) (HCC)    Polycythemia    PONV (postoperative nausea and vomiting)    hasn't had surgery since 1993   Vasovagal syncope     Tobacco History: Social History   Tobacco Use  Smoking Status Former   Current packs/day: 0.00   Types: Cigarettes   Quit date: 1982   Years since quitting: 43.5  Smokeless Tobacco Never  Tobacco Comments   Smoked 16-40ish, 1-2 ppd   Counseling given: Not Answered Tobacco comments: Smoked 16-40ish, 1-2 ppd   Continue to not smoke  Outpatient Encounter Medications as of 10/03/2023  Medication Sig   montelukast  (SINGULAIR ) 10 MG tablet Take 1 tablet (10 mg total) by mouth at bedtime.   acetaminophen  (TYLENOL ) 325 MG tablet Take 1-2 tablets (325-650 mg total) by mouth every 4 (four) hours as needed for mild pain.   acetaminophen  (TYLENOL ) 650 MG CR tablet  Take 1,300 mg by mouth every 8 (eight) hours as needed for pain.   albuterol  (PROVENTIL  HFA;VENTOLIN  HFA) 108 (90 Base) MCG/ACT inhaler Inhale 1-2 puffs into the lungs every 6 (six) hours as needed for wheezing or shortness of breath (asthma/coughing).   ALPRAZolam  (XANAX ) 0.25 MG tablet Take 1 tablet (0.25 mg total) by mouth 3 (three) times daily as needed for anxiety.   aspirin  325 MG tablet Take  325 mg by mouth at bedtime.   atorvastatin  (LIPITOR) 20 MG tablet Take 20 mg by mouth daily.   azithromycin  (ZITHROMAX ) 250 MG tablet Take 1 tablet (250 mg total) by mouth daily.   budesonide -formoterol  (SYMBICORT ) 160-4.5 MCG/ACT inhaler Inhale 2 puffs into the lungs every 12 (twelve) hours.   cholecalciferol (VITAMIN D3) 25 MCG (1000 UNIT) tablet Take 1,000 Units by mouth daily.   fluticasone  (FLONASE ) 50 MCG/ACT nasal spray Place 1 spray into both nostrils daily as needed for allergies.   furosemide  (LASIX ) 20 MG tablet Take 20-40 mg by mouth See admin instructions. Take 20 mg on Monday, Wednesday, Friday, Saturday and Sunday, take 40 mg on Tuesday and Thursday   gabapentin  (NEURONTIN ) 100 MG capsule Take 100 mg by mouth 2 (two) times daily.   JARDIANCE  10 MG TABS tablet Take 10 mg by mouth daily.   levothyroxine  (SYNTHROID ) 75 MCG tablet Take 1 tablet (75 mcg total) by mouth daily before breakfast.   lisinopril  (ZESTRIL ) 10 MG tablet Take 10 mg by mouth daily.   Magnesium  200 MG TABS Take 200 mg by mouth daily.   metaxalone  (SKELAXIN ) 800 MG tablet Take 1 tablet (800 mg total) by mouth 3 (three) times daily as needed for muscle spasms. (Patient taking differently: Take 800 mg by mouth daily as needed for muscle spasms.)   omeprazole (PRILOSEC) 40 MG capsule Take 40 mg by mouth daily as needed (acid reflux).   ondansetron  (ZOFRAN -ODT) 4 MG disintegrating tablet Take 1 tablet (4 mg total) by mouth every 8 (eight) hours as needed for nausea or vomiting.   Polyethyl Glycol-Propyl Glycol (SYSTANE OP)  Place 1 drop into both eyes as needed (dry eyes).   polyethylene glycol (MIRALAX  / GLYCOLAX ) 17 g packet Take 17 g by mouth daily.   promethazine-dextromethorphan (PROMETHAZINE-DM) 6.25-15 MG/5ML syrup Take 2.5 mLs by mouth at bedtime as needed for cough.   senna-docusate (SENOKOT-S) 8.6-50 MG tablet Take 1 tablet by mouth 2 (two) times daily. (Patient taking differently: Take 3 tablets by mouth daily as needed for mild constipation or moderate constipation.)   traMADol  (ULTRAM ) 50 MG tablet Take 1 tablet (50 mg total) by mouth every 6 (six) hours as needed (pain 4-10). (Patient not taking: Reported on 09/04/2023)   No facility-administered encounter medications on file as of 10/03/2023.     Review of Systems  Review of Systems  No chest pain with exertion.  No orthopnea or PND.  Comprehensive review of systems otherwise negative. Physical Exam  BP 123/71 (BP Location: Left Arm, Patient Position: Sitting, Cuff Size: Normal)   Pulse 67   Ht 5' 1 (1.549 m)   Wt 148 lb (67.1 kg)   SpO2 100%   BMI 27.96 kg/m   Wt Readings from Last 5 Encounters:  10/03/23 148 lb (67.1 kg)  09/04/23 148 lb 3.2 oz (67.2 kg)  08/06/23 146 lb (66.2 kg)  07/27/23 148 lb 14.4 oz (67.5 kg)  07/19/23 143 lb (64.9 kg)    BMI Readings from Last 5 Encounters:  10/03/23 27.96 kg/m  09/04/23 27.55 kg/m  08/06/23 26.70 kg/m  07/27/23 27.23 kg/m  07/19/23 26.16 kg/m     Physical Exam General: Sitting in chair, no acute distress Eyes: EOMI, no icterus Neck: Supple, no JVP Pulmonary: Clear, no work of breathing Cardiovascular: Warm, no edema Abdomen: Nondistended MSK: No tenderness, no deformity Neuro: Normal gait, no weakness Psych: Normal mood, full affect   Assessment & Plan:   Chronic bronchitis: Chronic cough productive of  mucus, particularly in the evenings.  She has a 25-pack-year history.  In addition history of asthma.  Likely related to both, hard to tease out 1 over the other.  No  improvement in the past per her report with Symbicort  although this was relatively remote, 5 years ago.  Escalated triple inhaled therapy via Breztri, sample today, if helpful can prescribe.  Instructed her to call her or send MyChart message and let me know.  Rhinorrhea/seasonal allergies: Not really effectively controlled with Allegra.  Instructed to stop.  Try Zyrtec or generic Zyrtec over-the-counter nightly.  In addition add montelukast .  See if this helps with her cough.  Consider addition of intranasal therapies if not improving at next visit.   Return in about 3 months (around 01/03/2024) for f/u Dr. Annella.   Regina JONELLE Annella, MD 10/03/2023

## 2023-10-03 NOTE — Addendum Note (Signed)
 Addended by: ARMAND BURNARD SAUNDERS on: 10/03/2023 03:35 PM   Modules accepted: Orders

## 2023-10-04 ENCOUNTER — Ambulatory Visit (HOSPITAL_COMMUNITY)
Admission: RE | Admit: 2023-10-04 | Discharge: 2023-10-04 | Disposition: A | Discharge status: Home / Self Care | Source: Ambulatory Visit | Attending: Vascular Surgery | Admitting: Vascular Surgery

## 2023-10-04 DIAGNOSIS — T82898A Other specified complication of vascular prosthetic devices, implants and grafts, initial encounter: Secondary | ICD-10-CM

## 2023-10-04 DIAGNOSIS — I70221 Atherosclerosis of native arteries of extremities with rest pain, right leg: Secondary | ICD-10-CM

## 2023-10-04 LAB — VAS US ABI WITH/WO TBI
Left ABI: 0.49
Right ABI: 0.4

## 2023-10-09 ENCOUNTER — Encounter: Payer: Self-pay | Admitting: Vascular Surgery

## 2023-10-09 ENCOUNTER — Ambulatory Visit: Attending: Vascular Surgery | Admitting: Vascular Surgery

## 2023-10-09 VITALS — BP 146/63 | HR 62 | Temp 97.8°F | Ht 61.0 in | Wt 150.0 lb

## 2023-10-09 DIAGNOSIS — I739 Peripheral vascular disease, unspecified: Secondary | ICD-10-CM

## 2023-10-09 NOTE — Progress Notes (Signed)
 [facility] [place]  ASSESSMENT / PLAN: Regina [name] is a 83 y.o. female status post right common femoral endarterectomy for rest pain. She has radicular type pain and numbness in her foot. No typical symptoms of rest pain. She does report some claudication in her calves.   Recommend:  Abstinence from all tobacco products. Blood glucose control with goal A1c < 7%. Blood pressure control with goal blood pressure < 130/80 mmHg. Lipid reduction therapy with goal LDL-C < 55 mg/dL. Aspirin  81mg  by mouth daily. Atorvastatin  40-80mg  PO QD (or other high intensity statin therapy).  Discussed with Dr. Gretta who evaluated the patient as well. He will see her back in one month to monitor her symptoms. Encouraged patient to discuss possible radiculopathy with her PCP.  CHIEF COMPLAINT: pain after surgery  HISTORY OF PRESENT ILLNESS: Regina [name] is a 83 y.o. female worked into clinic for evaluation of new right leg discomfort.  The patient reports shooting pain in the right medial leg beginning several weeks after surgery.  She reports initial symptom relief in her foot.  She does not have severe pain in her foot at this time.  She does report some numbness.  She describes cramping pain in both calves with walking.  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  Past Medical History:  Diagnosis Date   Anxiety    Arthritis    neck   Asthma    Cervical spondylolysis    Chronic kidney disease    Stage IIIB   Depression    Hashimoto's disease    Hyperlipidemia    Hypertension    Insulin  resistance    PAD (peripheral artery disease) (HCC)    Polycythemia    PONV (postoperative nausea and vomiting)    hasn't had surgery since 1993   Vasovagal syncope     Past Surgical History:  Procedure Laterality Date   ABDOMINAL AORTOGRAM W/LOWER EXTREMITY N/A 07/19/2023   Procedure: ABDOMINAL AORTOGRAM W/LOWER EXTREMITY;  Surgeon: Gretta Lonni PARAS, MD;  Location: [facility];  Service: Cardiovascular;  Laterality: N/A;   ANTERIOR CERVICAL DECOMP/DISCECTOMY FUSION N/A 12/06/2017   Procedure: ANTERIOR CERVICAL DECOMPRESSION/DISCECTOMY FUSION, CERVICAL 3- CERVICAL 4, CERVICAL 4- CERVICAL 5, CERVICAL 5- CERVICAL 6;  Surgeon: Lanis Pupa, MD;  Location: [facility] OR;  Service: Neurosurgery;  Laterality: N/A;  ANTERIOR CERVICAL DECOMPRESSION/DISCECTOMY FUSION, CERVICAL THREE- CERVICAL FOUR, CERVICAL FOUR- CERVICAL FIVE, CERVICAL FIVE- CERVICAL SIX   APPENDECTOMY     BREAST SURGERY     left biopsy   CESAREAN SECTION     x2   CHOLECYSTECTOMY     DENTAL SURGERY     teeth removal   ENDARTERECTOMY FEMORAL Right 08/06/2023   Procedure: ENDARTERECTOMY, FEMORAL, RIGHT;  Surgeon: Gretta Lonni PARAS, MD;  Location: [facility];  Service: Vascular;  Laterality: Right;   LOWER EXTREMITY ANGIOGRAPHY N/A 07/19/2023   Procedure: Lower Extremity Angiography;  Surgeon: Gretta Lonni PARAS, MD;  Location: [facility];  Service: Cardiovascular;  Laterality: N/A;   OVARY SURGERY     ovarian wedge   PATCH ANGIOPLASTY Right 08/06/2023   Procedure: PROFUNDOPLASTY, USING BOVINE PATCH GRAFT, RIGHT;  Surgeon: Gretta Lonni PARAS, MD;  Location: [facility];  Service: Vascular;  Laterality: Right;   TONSILLECTOMY      Family History  Problem Relation Age of Onset   COPD Mother    Asthma Father    Heart disease Father    Asthma Paternal Grandfather  Social History   Socioeconomic History   Marital status: Married    Spouse name: Not on file   Number of children: Not on file   Years of education: Not on file   Highest education level: Not on file  Occupational History   Not on file  Tobacco Use   Smoking status: Former    Current packs/day: 0.00    Types: Cigarettes    Quit date: 55    Years since quitting: 43.5   Smokeless tobacco: Never   Tobacco comments:    Smoked 16-40ish, 1-2 ppd  Vaping Use   Vaping status: Never Used   Substance and Sexual Activity   Alcohol use: Yes    Comment: very rare   Drug use: Never   Sexual activity: Not Currently  Other Topics Concern   Not on file  Social History Narrative   Right handed   One story home   Drinks cafffeine   Social Drivers of Health   Financial Resource Strain: Not on file  Food Insecurity: No Food Insecurity (08/06/2023)   Hunger Vital Sign    Worried About Running Out of Food in the Last Year: Never true    Ran Out of Food in the Last Year: Never true  Transportation Needs: No Transportation Needs (08/06/2023)   PRAPARE - Administrator, Civil Service (Medical): No    Lack of Transportation (Non-Medical): No  Physical Activity: Not on file  Stress: Not on file  Social Connections: Socially Integrated (08/06/2023)   Social Connection and Isolation Panel    Frequency of Communication with Friends and Family: More than three times a week    Frequency of Social Gatherings with Friends and Family: More than three times a week    Attends Religious Services: More than 4 times per year    Active Member of [employer] or Organizations: Yes    Attends Banker Meetings: 1 to 4 times per year    Marital Status: Married  Catering manager Violence: Not At Risk (08/06/2023)   Humiliation, Afraid, Rape, and Kick questionnaire    Fear of Current or Ex-Partner: No    Emotionally Abused: No    Physically Abused: No    Sexually Abused: No    Allergies  Allergen Reactions   Ceclor [Cefaclor] Rash   Naproxen Hives and Swelling    Mouth swelling   Sulfa Antibiotics Nausea And Vomiting and Other (See Comments)    Syncope/headaches    Amlodipine Cough   Augmentin [Amoxicillin-Pot Clavulanate] Nausea And Vomiting    Has patient had a PCN reaction causing immediate rash, facial/tongue/throat swelling, SOB or lightheadedness with hypotension: No Has patient had a PCN reaction causing severe rash involving mucus membranes or skin necrosis: No Has  patient had a PCN reaction that required hospitalization: No Has patient had a PCN reaction occurring within the last 10 years: No If all of the above answers are NO, then may proceed with Cephalosporin use.    Other Nausea And Vomiting    pain medications or opoids/Anesthesia   Valsartan Cough    Current Outpatient Medications  Medication Sig Dispense Refill   acetaminophen  (TYLENOL ) 325 MG tablet Take 1-2 tablets (325-650 mg total) by mouth every 4 (four) hours as needed for mild pain.     acetaminophen  (TYLENOL ) 650 MG CR tablet Take 1,300 mg by mouth every 8 (eight) hours as needed for pain.     albuterol  (VENTOLIN  HFA) 108 (90 Base) MCG/ACT inhaler Inhale  2 puffs into the lungs every 6 (six) hours as needed for wheezing or shortness of breath (asthma/coughing). 1 each 11   ALPRAZolam  (XANAX ) 0.25 MG tablet Take 1 tablet (0.25 mg total) by mouth 3 (three) times daily as needed for anxiety. 30 tablet 0   aspirin  325 MG tablet Take 325 mg by mouth at bedtime.     atorvastatin  (LIPITOR) 20 MG tablet Take 20 mg by mouth daily.     azithromycin  (ZITHROMAX ) 250 MG tablet Take 1 tablet (250 mg total) by mouth daily. 4 tablet 0   budesonide -formoterol  (SYMBICORT ) 160-4.5 MCG/ACT inhaler Inhale 2 puffs into the lungs every 12 (twelve) hours. 1 Inhaler 0   budesonide -glycopyrrolate -formoterol  (BREZTRI  AEROSPHERE) 160-9-4.8 MCG/ACT AERO inhaler 4 samples     cholecalciferol (VITAMIN D3) 25 MCG (1000 UNIT) tablet Take 1,000 Units by mouth daily.     fluticasone  (FLONASE ) 50 MCG/ACT nasal spray Place 1 spray into both nostrils daily as needed for allergies.     furosemide  (LASIX ) 20 MG tablet Take 20-40 mg by mouth See admin instructions. Take 20 mg on Monday, Wednesday, Friday, Saturday and Sunday, take 40 mg on Tuesday and Thursday     gabapentin  (NEURONTIN ) 100 MG capsule Take 100 mg by mouth 2 (two) times daily.     JARDIANCE  10 MG TABS tablet Take 10 mg by mouth daily.     levothyroxine   (SYNTHROID ) 75 MCG tablet Take 1 tablet (75 mcg total) by mouth daily before breakfast. 30 tablet 0   lisinopril  (ZESTRIL ) 10 MG tablet Take 10 mg by mouth daily.     Magnesium  200 MG TABS Take 200 mg by mouth daily.     metaxalone  (SKELAXIN ) 800 MG tablet Take 1 tablet (800 mg total) by mouth 3 (three) times daily as needed for muscle spasms. (Patient taking differently: Take 800 mg by mouth daily as needed for muscle spasms.) 60 tablet 0   montelukast  (SINGULAIR ) 10 MG tablet Take 1 tablet (10 mg total) by mouth at bedtime. 30 tablet 3   omeprazole (PRILOSEC) 40 MG capsule Take 40 mg by mouth daily as needed (acid reflux).     ondansetron  (ZOFRAN -ODT) 4 MG disintegrating tablet Take 1 tablet (4 mg total) by mouth every 8 (eight) hours as needed for nausea or vomiting. 20 tablet 0   Polyethyl Glycol-Propyl Glycol (SYSTANE OP) Place 1 drop into both eyes as needed (dry eyes).     polyethylene glycol (MIRALAX  / GLYCOLAX ) 17 g packet Take 17 g by mouth daily.     promethazine-dextromethorphan (PROMETHAZINE-DM) 6.25-15 MG/5ML syrup Take 2.5 mLs by mouth at bedtime as needed for cough.     senna-docusate (SENOKOT-S) 8.6-50 MG tablet Take 1 tablet by mouth 2 (two) times daily. (Patient taking differently: Take 3 tablets by mouth daily as needed for mild constipation or moderate constipation.)     traMADol  (ULTRAM ) 50 MG tablet Take 1 tablet (50 mg total) by mouth every 6 (six) hours as needed (pain 4-10). 30 tablet 1   No current facility-administered medications for this visit.    PHYSICAL EXAM Vitals:   10/09/23 0911  BP: (!) 146/63  Pulse: 62  Temp: 97.8 F (36.6 C)  SpO2: 99%  Weight: 150 lb (68 kg)  Height: 5' 1 (1.549 m)   Elderly woman in no distress Regular rate and rhythm Unlabored breathing Well-healed right groin incision No palpable pedal pulses   PERTINENT LABORATORY AND RADIOLOGIC DATA  Most recent CBC    Latest Ref Rng & Units  08/07/2023   12:34 AM 08/06/2023   10:25 AM  08/06/2023    9:49 AM  CBC  WBC 4.0 - 10.5 K/uL 8.6  7.7    Hemoglobin 12.0 - 15.0 g/dL 89.4  88.6  9.9   Hematocrit 36.0 - 46.0 % 31.2  35.6  29.0   Platelets 150 - 400 K/uL 217  240       Most recent CMP    Latest Ref Rng & Units 08/07/2023   12:34 AM 08/06/2023   10:25 AM 08/06/2023    9:49 AM  CMP  Glucose 70 - 99 mg/dL 880     BUN 8 - 23 mg/dL 25     Creatinine 9.55 - 1.00 mg/dL 8.30  8.29    Sodium 864 - 145 mmol/L 134   138   Potassium 3.5 - 5.1 mmol/L 4.6   4.1   Chloride 98 - 111 mmol/L 107     CO2 22 - 32 mmol/L 19     Calcium  8.9 - 10.3 mg/dL 9.0       Renal function CrCl cannot be calculated (Patient's most recent lab result is older than the maximum 21 days allowed.).  Hgb A1c MFr Bld (%)  Date Value  08/06/2023 5.9 (H)    LDL Cholesterol  Date Value Ref Range Status  08/07/2023 40 0 - 99 mg/dL Final    Comment:           Total Cholesterol/HDL:CHD Risk Coronary Heart Disease Risk Table                     Men   Women  1/2 Average Risk   3.4   3.3  Average Risk       5.0   4.4  2 X Average Risk   9.6   7.1  3 X Average Risk  23.4   11.0        Use the calculated Patient Ratio above and the CHD Risk Table to determine the patient's CHD Risk.        ATP III CLASSIFICATION (LDL):  <100     mg/dL   Optimal  899-870  mg/dL   Near or Above                    Optimal  130-159  mg/dL   Borderline  839-810  mg/dL   High  >809     mg/dL   Very High Performed at [facility], [address], [place] [address]      +-------+-----------+-----------+------------+------------+  ABI/TBIToday's ABIToday's TBIPrevious ABIPrevious TBI  +-------+-----------+-----------+------------+------------+  Right .40        .24        .40         .28           +-------+-----------+-----------+------------+------------+  Left  .49        .44        .60         .39           +-------+-----------+-----------+------------+------------+     Right: Patent common femoral artery without evidence of stenosis, s/p  endarterectomy.  50-99% stenosis in the DFA, s/p profundaplasty.  Total occlusion of the proximal and mid SFA.  Short segment occlusion in the distal SFA/AK popliteal artery.    Debby SAILOR. Magda, MD [facility] Phone Number: [phone] 10/09/2023 9:44 AM   Total time spent  on preparing this encounter including chart review, data review, collecting history, examining the patient, and coordinating care: Established patient, 30 minutes  Portions of this report may have been transcribed using voice recognition software.  Every effort has been made to ensure accuracy; however, inadvertent computerized transcription errors may still be present.

## 2023-10-10 ENCOUNTER — Other Ambulatory Visit: Payer: Self-pay

## 2023-10-10 DIAGNOSIS — I739 Peripheral vascular disease, unspecified: Secondary | ICD-10-CM

## 2023-10-23 ENCOUNTER — Encounter (HOSPITAL_COMMUNITY)

## 2023-10-23 ENCOUNTER — Ambulatory Visit: Admitting: Vascular Surgery

## 2023-10-29 ENCOUNTER — Other Ambulatory Visit: Payer: Self-pay | Admitting: Family Medicine

## 2023-10-29 DIAGNOSIS — M48061 Spinal stenosis, lumbar region without neurogenic claudication: Secondary | ICD-10-CM

## 2023-10-29 DIAGNOSIS — M47816 Spondylosis without myelopathy or radiculopathy, lumbar region: Secondary | ICD-10-CM

## 2023-10-29 DIAGNOSIS — M79605 Pain in left leg: Secondary | ICD-10-CM

## 2023-10-29 DIAGNOSIS — M79604 Pain in right leg: Secondary | ICD-10-CM

## 2023-10-31 ENCOUNTER — Ambulatory Visit
Admission: RE | Admit: 2023-10-31 | Discharge: 2023-10-31 | Disposition: A | Discharge status: Home / Self Care | Source: Ambulatory Visit | Attending: Family Medicine | Admitting: Family Medicine

## 2023-10-31 DIAGNOSIS — M48061 Spinal stenosis, lumbar region without neurogenic claudication: Secondary | ICD-10-CM

## 2023-10-31 DIAGNOSIS — M79604 Pain in right leg: Secondary | ICD-10-CM

## 2023-10-31 DIAGNOSIS — M79605 Pain in left leg: Secondary | ICD-10-CM

## 2023-10-31 DIAGNOSIS — M47816 Spondylosis without myelopathy or radiculopathy, lumbar region: Secondary | ICD-10-CM

## 2023-11-20 ENCOUNTER — Ambulatory Visit (HOSPITAL_COMMUNITY)
Admission: RE | Admit: 2023-11-20 | Discharge: 2023-11-20 | Disposition: A | Discharge status: Home / Self Care | Source: Ambulatory Visit | Attending: Vascular Surgery | Admitting: Vascular Surgery

## 2023-11-20 ENCOUNTER — Ambulatory Visit: Admitting: Vascular Surgery

## 2023-11-20 ENCOUNTER — Encounter: Payer: Self-pay | Admitting: Vascular Surgery

## 2023-11-20 ENCOUNTER — Other Ambulatory Visit: Payer: Self-pay

## 2023-11-20 VITALS — BP 142/61 | HR 64 | Temp 97.8°F | Resp 18 | Ht 61.0 in | Wt 151.2 lb

## 2023-11-20 DIAGNOSIS — I70221 Atherosclerosis of native arteries of extremities with rest pain, right leg: Secondary | ICD-10-CM | POA: Diagnosis not present

## 2023-11-20 DIAGNOSIS — I739 Peripheral vascular disease, unspecified: Secondary | ICD-10-CM | POA: Diagnosis not present

## 2023-11-20 LAB — VAS US ABI WITH/WO TBI
Left ABI: 0.64
Right ABI: 0.37

## 2023-11-20 NOTE — Progress Notes (Signed)
 Patient name: Regina [name] MRN: [id number] DOB: [birth year] Sex: female  REASON FOR CONSULT: PAD  HPI: Regina [name] is a 83 y.o. female, with history of hypertension, hyperlipidemia, CKD stage 3 that presents for ongoing follow-up of her PAD.  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  She was seen last month by Dr. Magda in follow-up and was still having right foot pain.  Today she thinks the pain is a little bit better in her right foot.  States she saw Dr. Lanis with neurosurgery and is supposed to get a back injection.  Her main complaint is she still still disabled by her right leg when walking.  States even after the femoral endarterectomy although her leg is improved she really cannot walk through the grocery store keep up with her grandchildren.    Past Medical History:  Diagnosis Date   Anxiety    Arthritis    neck   Asthma    Cervical spondylolysis    Chronic kidney disease    Stage IIIB   Depression    Hashimoto's disease    Hyperlipidemia    Hypertension    Insulin  resistance    PAD (peripheral artery disease) (HCC)    Polycythemia    PONV (postoperative nausea and vomiting)    hasn't had surgery since 1993   Vasovagal syncope     Past Surgical History:  Procedure Laterality Date   ABDOMINAL AORTOGRAM W/LOWER EXTREMITY N/A 07/19/2023   Procedure: ABDOMINAL AORTOGRAM W/LOWER EXTREMITY;  Surgeon: Gretta Lonni PARAS, MD;  [facility]: [facility];  Service: Cardiovascular;  Laterality: N/A;   ANTERIOR CERVICAL DECOMP/DISCECTOMY FUSION N/A 12/06/2017   Procedure: ANTERIOR CERVICAL DECOMPRESSION/DISCECTOMY FUSION, CERVICAL 3- CERVICAL 4, CERVICAL 4- CERVICAL 5, CERVICAL 5- CERVICAL 6;  Surgeon: Lanis Pupa, MD;  Location: [facility];  Service: Neurosurgery;  Laterality: N/A;  ANTERIOR CERVICAL DECOMPRESSION/DISCECTOMY FUSION, CERVICAL THREE- CERVICAL FOUR,  CERVICAL FOUR- CERVICAL FIVE, CERVICAL FIVE- CERVICAL SIX   APPENDECTOMY     BREAST SURGERY     left biopsy   CESAREAN SECTION     x2   CHOLECYSTECTOMY     DENTAL SURGERY     teeth removal   ENDARTERECTOMY FEMORAL Right 08/06/2023   Procedure: ENDARTERECTOMY, FEMORAL, RIGHT;  Surgeon: Gretta Lonni PARAS, MD;  Location: [facility];  Service: Vascular;  Laterality: Right;   LOWER EXTREMITY ANGIOGRAPHY N/A 07/19/2023   Procedure: Lower Extremity Angiography;  Surgeon: Gretta Lonni PARAS, MD;  [facility]: [facility];  Service: Cardiovascular;  Laterality: N/A;   OVARY SURGERY     ovarian wedge   PATCH ANGIOPLASTY Right 08/06/2023   Procedure: PROFUNDOPLASTY, USING BOVINE PATCH GRAFT, RIGHT;  Surgeon: Gretta Lonni PARAS, MD;  Location: [facility];  Service: Vascular;  Laterality: Right;   TONSILLECTOMY      Family History  Problem Relation Age of Onset   COPD Mother    Asthma Father    Heart disease Father    Asthma Paternal Grandfather     SOCIAL HISTORY: Social History   Socioeconomic History   Marital status: Married    Spouse name: Not on file   Number of children: Not on file   Years of education: Not on file   Highest education level: Not on file  Occupational History   Not on file  Tobacco Use   Smoking status: Former    Current packs/day: 0.00    Types: Cigarettes  Quit date: 16    Years since quitting: 43.6   Smokeless tobacco: Never   Tobacco comments:    Smoked [age]ish, 1-2 ppd  Vaping Use   Vaping status: Never Used  Substance and Sexual Activity   Alcohol use: Yes    Comment: very rare   Drug use: Never   Sexual activity: Not Currently  Other Topics Concern   Not on file  Social History Narrative   Right handed   One story home   Drinks cafffeine   Social Drivers of Health   Financial Resource Strain: Not on file  Food Insecurity: No Food Insecurity (08/06/2023)   Hunger Vital Sign    Worried About Running Out of Food in the Last Year: Never  true    Ran Out of Food in the Last Year: Never true  Transportation Needs: No Transportation Needs (08/06/2023)   PRAPARE - Administrator, Civil Service (Medical): No    Lack of Transportation (Non-Medical): No  Physical Activity: Not on file  Stress: Not on file  Social Connections: Socially Integrated (08/06/2023)   Social Connection and Isolation Panel    Frequency of Communication with Friends and Family: More than three times a week    Frequency of Social Gatherings with Friends and Family: More than three times a week    Attends Religious Services: More than 4 times per year    Active Member of [employer] or Organizations: Yes    Attends Banker Meetings: 1 to 4 times per year    Marital Status: Married  Catering manager Violence: Not At Risk (08/06/2023)   Humiliation, Afraid, Rape, and Kick questionnaire    Fear of Current or Ex-Partner: No    Emotionally Abused: No    Physically Abused: No    Sexually Abused: No    Allergies  Allergen Reactions   Ceclor [Cefaclor] Rash   Naproxen Hives and Swelling    Mouth swelling   Sulfa Antibiotics Nausea And Vomiting and Other (See Comments)    Syncope/headaches    Amlodipine Cough   Augmentin [Amoxicillin-Pot Clavulanate] Nausea And Vomiting    Has patient had a PCN reaction causing immediate rash, facial/tongue/throat swelling, SOB or lightheadedness with hypotension: No Has patient had a PCN reaction causing severe rash involving mucus membranes or skin necrosis: No Has patient had a PCN reaction that required hospitalization: No Has patient had a PCN reaction occurring within the last 10 years: No If all of the above answers are NO, then may proceed with Cephalosporin use.    Other Nausea And Vomiting    pain medications or opoids/Anesthesia   Valsartan Cough    Current Outpatient Medications  Medication Sig Dispense Refill   acetaminophen  (TYLENOL ) 325 MG tablet Take 1-2 tablets (325-650 mg total) by  mouth every 4 (four) hours as needed for mild pain.     acetaminophen  (TYLENOL ) 650 MG CR tablet Take 1,300 mg by mouth every 8 (eight) hours as needed for pain.     albuterol  (VENTOLIN  HFA) 108 (90 Base) MCG/ACT inhaler Inhale 2 puffs into the lungs every 6 (six) hours as needed for wheezing or shortness of breath (asthma/coughing). 1 each 11   ALPRAZolam  (XANAX ) 0.25 MG tablet Take 1 tablet (0.25 mg total) by mouth 3 (three) times daily as needed for anxiety. 30 tablet 0   aspirin  325 MG tablet Take 325 mg by mouth at bedtime.     atorvastatin  (LIPITOR) 20 MG tablet Take 20 mg by  mouth daily.     azithromycin  (ZITHROMAX ) 250 MG tablet Take 1 tablet (250 mg total) by mouth daily. 4 tablet 0   budesonide -formoterol  (SYMBICORT ) 160-4.5 MCG/ACT inhaler Inhale 2 puffs into the lungs every 12 (twelve) hours. 1 Inhaler 0   budesonide -glycopyrrolate -formoterol  (BREZTRI  AEROSPHERE) 160-9-4.8 MCG/ACT AERO inhaler 4 samples     cholecalciferol (VITAMIN D3) 25 MCG (1000 UNIT) tablet Take 1,000 Units by mouth daily.     fluticasone  (FLONASE ) 50 MCG/ACT nasal spray Place 1 spray into both nostrils daily as needed for allergies.     furosemide  (LASIX ) 20 MG tablet Take 20-40 mg by mouth See admin instructions. Take 20 mg on Monday, Wednesday, Friday, Saturday and Sunday, take 40 mg on Tuesday and Thursday     gabapentin  (NEURONTIN ) 100 MG capsule Take 100 mg by mouth 2 (two) times daily.     JARDIANCE  10 MG TABS tablet Take 10 mg by mouth daily.     levothyroxine  (SYNTHROID ) 75 MCG tablet Take 1 tablet (75 mcg total) by mouth daily before breakfast. 30 tablet 0   lisinopril  (ZESTRIL ) 10 MG tablet Take 10 mg by mouth daily.     Magnesium  200 MG TABS Take 200 mg by mouth daily.     metaxalone  (SKELAXIN ) 800 MG tablet Take 1 tablet (800 mg total) by mouth 3 (three) times daily as needed for muscle spasms. (Patient taking differently: Take 800 mg by mouth daily as needed for muscle spasms.) 60 tablet 0    montelukast  (SINGULAIR ) 10 MG tablet Take 1 tablet (10 mg total) by mouth at bedtime. 30 tablet 3   omeprazole (PRILOSEC) 40 MG capsule Take 40 mg by mouth daily as needed (acid reflux).     ondansetron  (ZOFRAN -ODT) 4 MG disintegrating tablet Take 1 tablet (4 mg total) by mouth every 8 (eight) hours as needed for nausea or vomiting. 20 tablet 0   Polyethyl Glycol-Propyl Glycol (SYSTANE OP) Place 1 drop into both eyes as needed (dry eyes).     polyethylene glycol (MIRALAX  / GLYCOLAX ) 17 g packet Take 17 g by mouth daily.     promethazine-dextromethorphan (PROMETHAZINE-DM) 6.25-15 MG/5ML syrup Take 2.5 mLs by mouth at bedtime as needed for cough.     senna-docusate (SENOKOT-S) 8.6-50 MG tablet Take 1 tablet by mouth 2 (two) times daily. (Patient taking differently: Take 3 tablets by mouth daily as needed for mild constipation or moderate constipation.)     traMADol  (ULTRAM ) 50 MG tablet Take 1 tablet (50 mg total) by mouth every 6 (six) hours as needed (pain 4-10). 30 tablet 1   No current facility-administered medications for this visit.    REVIEW OF SYSTEMS:  [X]  denotes positive finding, [ ]  denotes negative finding Cardiac  Comments:  Chest pain or chest pressure:    Shortness of breath upon exertion:    Short of breath when lying flat:    Irregular heart rhythm:        Vascular    Pain in calf, thigh, or hip brought on by ambulation:    Pain in feet at night that wakes you up from your sleep:  x Right foot   Blood clot in your veins:    Leg swelling:         Pulmonary    Oxygen at home:    Productive cough:     Wheezing:         Neurologic    Sudden weakness in arms or legs:     Sudden numbness in arms  or legs:     Sudden onset of difficulty speaking or slurred speech:    Temporary loss of vision in one eye:     Problems with dizziness:         Gastrointestinal    Blood in stool:     Vomited blood:         Genitourinary    Burning when urinating:     Blood in urine:         Psychiatric    Major depression:         Hematologic    Bleeding problems:    Problems with blood clotting too easily:        Skin    Rashes or ulcers:        Constitutional    Fever or chills:      PHYSICAL EXAM: Vitals:   11/20/23 1444  BP: (!) 142/61  Pulse: 64  Resp: 18  Temp: 97.8 F (36.6 C)  TempSrc: Temporal  SpO2: 98%  Weight: 151 lb 3.2 oz (68.6 kg)  Height: 5' 1 (1.549 m)    GENERAL: The patient is a well-nourished female, in no acute distress. The vital signs are documented above. CARDIAC: There is a regular rate and rhythm.  VASCULAR: No tissue loss Right femoral pulse easily palpable and incision well healed Weakly palpable left femoral pulse PULMONARY: No respiratory distress. ABDOMEN: Soft and non-tender. MUSCULOSKELETAL: There are no major deformities or cyanosis. NEUROLOGIC: No focal weakness or paresthesias are detected. SKIN: There are no ulcers or rashes noted. PSYCHIATRIC: The patient has a normal affect.  DATA:   ABIs today are 0.37 on the right and 0.64 on the left  Assessment/Plan:   83 y.o. female, with history of hypertension, hyperlipidemia, CKD stage 3 that presents for ongoing follow-up of her PAD.  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  Although her rest pain symptoms are better she is still having pretty disabling claudication symptoms in the right leg.  Discussed the option of delaying repeat angiogram until after her back injection with Dr. Lanis and then proceeding with angiography to evaluate her right femoral endarterectomy and runoff.  Although she is better, I would hope that her symptoms were more improved after endarterectomy alone.  Will see if there is any percutaneous option and/or may ultimately require surgical bypass pending our findings.  ABI today 0.37 and I was hopeful this would have been more improved after endarterectomy alone.   Will schedule in the cath lab and risks benefits discussed.  Lonni DOROTHA Gaskins, MD [facility] Office: [phone]

## 2023-11-29 LAB — LAB REPORT - SCANNED
Calcium: 9.7
EGFR: 17
PTH: 88

## 2023-11-30 ENCOUNTER — Ambulatory Visit: Attending: Internal Medicine | Admitting: Internal Medicine

## 2023-11-30 ENCOUNTER — Encounter: Payer: Self-pay | Admitting: Internal Medicine

## 2023-11-30 VITALS — BP 124/70 | HR 63 | Ht 61.0 in | Wt 154.2 lb

## 2023-11-30 DIAGNOSIS — R0602 Shortness of breath: Secondary | ICD-10-CM | POA: Insufficient documentation

## 2023-11-30 DIAGNOSIS — I340 Nonrheumatic mitral (valve) insufficiency: Secondary | ICD-10-CM | POA: Diagnosis present

## 2023-11-30 NOTE — Patient Instructions (Addendum)
 Medication Instructions:  No changes *If you need a refill on your cardiac medications before your next appointment, please call your pharmacy*  Lab Work: none If you have labs (blood work) drawn today and your tests are completely normal, you will receive your results only by: MyChart Message (if you have MyChart) OR A paper copy in the mail If you have any lab test that is abnormal or we need to change your treatment, we will call you to review the results.  Testing/Procedures: Your physician has requested that you have an echocardiogram. Echocardiography is a painless test that uses sound waves to create images of your heart. It provides your doctor with information about the size and shape of your heart and how well your heart's chambers and valves are working. This procedure takes approximately one hour. There are no restrictions for this procedure. Please do NOT wear cologne, perfume, aftershave, or lotions (deodorant is allowed). Please arrive 15 minutes prior to your appointment time.  Please note: We ask at that you not bring children with you during ultrasound (echo/ vascular) testing. Due to room size and safety concerns, children are not allowed in the ultrasound rooms during exams. Our front office staff cannot provide observation of children in our lobby area while testing is being conducted. An adult accompanying a patient to their appointment will only be allowed in the ultrasound room at the discretion of the ultrasound technician under special circumstances. We apologize for any inconvenience.   Follow-Up: At [facility], you and your health needs are our priority.  As part of our continuing mission to provide you with exceptional heart care, our providers are all part of one team.  This team includes your primary Cardiologist (physician) and Advanced Practice Providers or APPs (Physician Assistants and Nurse Practitioners) who all work together to provide you with the  care you need, when you need it.  Your next appointment:   6 month(s)  Provider:   Vina Gull MD       Please report to Radiology at the [facility] Main Entrance 30 minutes early for your test.  [address] [place], [place] [address]    How to Prepare for Your Cardiac PET/CT Stress Test:  Nothing to eat or drink, except water, 3 hours prior to arrival time.  NO caffeine/decaffeinated products, or chocolate 12 hours prior to arrival. (Please note decaffeinated beverages (teas/coffees) still contain caffeine).  If you have caffeine within 12 hours prior, the test will need to be rescheduled.  Medication instructions: you are okay to take your morning mediations like normal with water  Diabetic Preparation: Patients who wear a continuous glucose monitor MUST remove the device prior to scanning.  NO perfume, cologne or lotion on chest or abdomen area. FEMALES - Please avoid wearing dresses to this appointment.  Total time is 1 to 2 hours; you may want to bring reading material for the waiting time.  IF YOU THINK YOU MAY BE PREGNANT, OR ARE NURSING PLEASE INFORM THE TECHNOLOGIST.  In preparation for your appointment, medication and supplies will be purchased.  Appointment availability is limited, so if you need to cancel or reschedule, please call the Radiology Department Scheduler at [phone] 24 hours in advance to avoid a cancellation fee of $100.00  What to Expect When you Arrive:  Once you arrive and check in for your appointment, you will be taken to a preparation room within the Radiology Department.  A technologist or Nurse will obtain your  medical history, verify that you are correctly prepped for the exam, and explain the procedure.  Afterwards, an IV will be started in your arm and electrodes will be placed on your skin for EKG monitoring during the stress portion of the exam. Then you will be escorted to the PET/CT scanner.  There, staff will get you  positioned on the scanner and obtain a blood pressure and EKG.  During the exam, you will continue to be connected to the EKG and blood pressure machines.  A small, safe amount of a radioactive tracer will be injected in your IV to obtain a series of pictures of your heart along with an injection of a stress agent.    After your Exam:  It is recommended that you eat a meal and drink a caffeinated beverage to counter act any effects of the stress agent.  Drink plenty of fluids for the remainder of the day and urinate frequently for the first couple of hours after the exam.  Your doctor will inform you of your test results within 7-10 business days.  For more information and frequently asked questions, please visit our website: [email or web address]  For questions about your test or how to prepare for your test, please call: Cardiac Imaging Nurse Navigators Office: [phone]

## 2023-11-30 NOTE — Progress Notes (Addendum)
 Cardiology Office Note   Date:  11/30/2023   ID:  Regina [name], Regina [name] 21-Dec-1940, MRN [id number]  PCP:  Dyane Anthony RAMAN, FNP  Cardiologist:   Vina Gull, MD    Pt referred for evaluation of mitral regurgitation.   History of Present Illness: Regina [name] is a 83 y.o. female with a history of HTN, HL, CKD stage 3 and extensive PAD    Followed in [facility] clinic with C Orval Splinter is s/p R common femoral endarterectomy    2019  Echo  LVEF normal   Moderate MR    Pt denies CP   She is not very active though due to pain in legs   She does note some SOB with activity     Pt also notes increaed fatigue   She denies palpitaitions  No dizziness     Current Meds  Medication Sig   albuterol  (VENTOLIN  HFA) 108 (90 Base) MCG/ACT inhaler Inhale 2 puffs into the lungs every 6 (six) hours as needed for wheezing or shortness of breath (asthma/coughing).   ALPRAZolam  (XANAX ) 0.25 MG tablet Take 1 tablet (0.25 mg total) by mouth 3 (three) times daily as needed for anxiety.   aspirin  325 MG tablet Take 325 mg by mouth at bedtime.   atorvastatin  (LIPITOR) 20 MG tablet Take 20 mg by mouth daily.   budesonide -glycopyrrolate -formoterol  (BREZTRI  AEROSPHERE) 160-9-4.8 MCG/ACT AERO inhaler 4 samples (Patient taking differently: 2 puffs in the morning and at bedtime. 4 samples)   cholecalciferol (VITAMIN D3) 25 MCG (1000 UNIT) tablet Take 1,000 Units by mouth daily.   fluticasone  (FLONASE ) 50 MCG/ACT nasal spray Place 1 spray into both nostrils daily as needed for allergies.   furosemide  (LASIX ) 20 MG tablet Take 20-40 mg by mouth See admin instructions. Take 20 mg on Monday, Wednesday, Friday, Saturday and Sunday, take 40 mg on Tuesday and Thursday   gabapentin  (NEURONTIN ) 100 MG capsule Take 100 mg by mouth 2 (two) times daily. (Patient taking differently: Take 100 mg by mouth 2 (two) times daily. Per patient taking 100 mg in the morning and 300 mg at night.)   JARDIANCE  10 MG TABS  tablet Take 10 mg by mouth daily.   levothyroxine  (SYNTHROID ) 75 MCG tablet Take 1 tablet (75 mcg total) by mouth daily before breakfast.   lisinopril  (ZESTRIL ) 10 MG tablet Take 10 mg by mouth daily.   Magnesium  200 MG TABS Take 200 mg by mouth daily.   metaxalone  (SKELAXIN ) 800 MG tablet Take 1 tablet (800 mg total) by mouth 3 (three) times daily as needed for muscle spasms.   omeprazole (PRILOSEC) 40 MG capsule Take 40 mg by mouth daily as needed (acid reflux).   ondansetron  (ZOFRAN -ODT) 4 MG disintegrating tablet Take 1 tablet (4 mg total) by mouth every 8 (eight) hours as needed for nausea or vomiting.   Polyethyl Glycol-Propyl Glycol (SYSTANE OP) Place 1 drop into both eyes as needed (dry eyes).   polyethylene glycol (MIRALAX  / GLYCOLAX ) 17 g packet Take 17 g by mouth daily.   promethazine-dextromethorphan (PROMETHAZINE-DM) 6.25-15 MG/5ML syrup Take 2.5 mLs by mouth at bedtime as needed for cough.   senna-docusate (SENOKOT-S) 8.6-50 MG tablet Take 1 tablet by mouth 2 (two) times daily.   traMADol  (ULTRAM ) 50 MG tablet Take 1 tablet (50 mg total) by mouth every 6 (six) hours as needed (pain 4-10).   [DISCONTINUED] acetaminophen  (TYLENOL ) 325 MG tablet Take 1-2 tablets (325-650 mg total) by mouth every 4 (four) hours  as needed for mild pain.   [DISCONTINUED] montelukast  (SINGULAIR ) 10 MG tablet Take 1 tablet (10 mg total) by mouth at bedtime.     Allergies:   Ceclor [cefaclor], Naproxen, Sulfa antibiotics, Amlodipine, Augmentin [amoxicillin-pot clavulanate], Other, and Valsartan   Past Medical History:  Diagnosis Date   Anxiety    Arthritis    neck   Asthma    Cervical spondylolysis    Chronic kidney disease    Stage IIIB   Depression    Hashimoto's disease    Hyperlipidemia    Hypertension    Insulin  resistance    PAD (peripheral artery disease) (HCC)    Polycythemia    PONV (postoperative nausea and vomiting)    hasn't had surgery since 1993   Vasovagal syncope     Past  Surgical History:  Procedure Laterality Date   ABDOMINAL AORTOGRAM W/LOWER EXTREMITY N/A 07/19/2023   Procedure: ABDOMINAL AORTOGRAM W/LOWER EXTREMITY;  Surgeon: Gretta Lonni PARAS, MD;  [facility];  Service: Cardiovascular;  Laterality: N/A;   ANTERIOR CERVICAL DECOMP/DISCECTOMY FUSION N/A 12/06/2017   Procedure: ANTERIOR CERVICAL DECOMPRESSION/DISCECTOMY FUSION, CERVICAL 3- CERVICAL 4, CERVICAL 4- CERVICAL 5, CERVICAL 5- CERVICAL 6;  Surgeon: Regina Pupa, MD;  Location: [facility];  Service: Neurosurgery;  Laterality: N/A;  ANTERIOR CERVICAL DECOMPRESSION/DISCECTOMY FUSION, CERVICAL THREE- CERVICAL FOUR, CERVICAL FOUR- CERVICAL FIVE, CERVICAL FIVE- CERVICAL SIX   APPENDECTOMY     BREAST SURGERY     left biopsy   CESAREAN SECTION     x2   CHOLECYSTECTOMY     DENTAL SURGERY     teeth removal   ENDARTERECTOMY FEMORAL Right 08/06/2023   Procedure: ENDARTERECTOMY, FEMORAL, RIGHT;  Surgeon: Gretta Lonni PARAS, MD;  Location: [facility];  Service: Vascular;  Laterality: Right;   LOWER EXTREMITY ANGIOGRAPHY N/A 07/19/2023   Procedure: Lower Extremity Angiography;  Surgeon: Gretta Lonni PARAS, MD;  [facility];  Service: Cardiovascular;  Laterality: N/A;   OVARY SURGERY     ovarian wedge   PATCH ANGIOPLASTY Right 08/06/2023   Procedure: PROFUNDOPLASTY, USING BOVINE PATCH GRAFT, RIGHT;  Surgeon: Gretta Lonni PARAS, MD;  Location: [facility];  Service: Vascular;  Laterality: Right;   TONSILLECTOMY       Social History:  The patient  reports that she quit smoking about 43 years ago. Her smoking use included cigarettes. She has never used smokeless tobacco. She reports current alcohol use. She reports that she does not use drugs.   Family History:  The patient's family history includes Asthma in her father and paternal grandfather; COPD in her mother; Heart disease in her father.    ROS:  Please see the history of present illness. All other systems are reviewed and   Negative to the above problem except as noted.    PHYSICAL EXAM: VS:  BP 124/70   Pulse 63   Ht 5' 1 (1.549 m)   Wt 154 lb 3.2 oz (69.9 kg)   SpO2 98%   BMI 29.14 kg/m   GEN: Well nourished, well developed, in no acute distress  HEENT: normal  Neck: no JVD, carotid bruits Cardiac: RRR;   I-II/VI systolic murmur LSB   Respiratory:  clear to auscultation  GI: soft, nontender, no masses  No hepatomegaly  MS: no deformity Moving all extremities   Ext   1+ PT   Tr LE edema  EKG:  EKG is not ordered today.   Lipid Panel    Component Value Date/Time   CHOL  102 08/07/2023 0500   TRIG 72 08/07/2023 0500   HDL 48 08/07/2023 0500   CHOLHDL 2.1 08/07/2023 0500   VLDL 14 08/07/2023 0500   LDLCALC 40 08/07/2023 0500      Wt Readings from Last 3 Encounters:  11/30/23 154 lb 3.2 oz (69.9 kg)  11/20/23 151 lb 3.2 oz (68.6 kg)  10/09/23 150 lb (68 kg)      ASSESSMENT AND PLAN:  1  Mitral regurgitation.   Moderate on echo in 2019  Murmur is not impressive. With previous echo would recomm repeat echo to reevaluate  2  Dyspnea with exertion.  With extensive PAD hx it would be important to r/o ishcemia, esp if pt will have interventions for PAD in future Will set up for Lexiscan PET/CT  3. HTN  BP is well controlled   4  HL  In May 2025 LDL 40  HDL 48  Trig 72  5  [employer]   A1C 5.8 in Aug  Reviwed diet  Cut back on carbs      Current medicines are reviewed at length with the patient today.  The patient does not have concerns regarding medicines.  Signed, Vina Gull, MD

## 2023-12-05 ENCOUNTER — Telehealth: Payer: Self-pay

## 2023-12-05 NOTE — Telephone Encounter (Signed)
 Patient called to cancel Abd A/O w/ RLE intervention.  She stated that she saw another physician (nephrology?) and she was advised to wait.  Paperwork sent to scanning.

## 2023-12-06 ENCOUNTER — Encounter (HOSPITAL_COMMUNITY): Admission: RE | Payer: Self-pay | Source: Home / Self Care

## 2023-12-06 ENCOUNTER — Ambulatory Visit (HOSPITAL_COMMUNITY): Admission: RE | Admit: 2023-12-06 | Source: Home / Self Care | Admitting: Vascular Surgery

## 2023-12-06 SURGERY — ABDOMINAL AORTOGRAM
Anesthesia: LOCAL

## 2023-12-07 LAB — LAB REPORT - SCANNED: EGFR: 19

## 2023-12-11 ENCOUNTER — Ambulatory Visit: Admitting: Cardiology

## 2024-01-07 ENCOUNTER — Ambulatory Visit (INDEPENDENT_AMBULATORY_CARE_PROVIDER_SITE_OTHER): Admitting: Pulmonary Disease

## 2024-01-07 ENCOUNTER — Encounter: Payer: Self-pay | Admitting: Pulmonary Disease

## 2024-01-07 VITALS — BP 137/73 | HR 76 | Temp 97.5°F | Ht 62.0 in | Wt 155.0 lb

## 2024-01-07 DIAGNOSIS — Z87891 Personal history of nicotine dependence: Secondary | ICD-10-CM

## 2024-01-07 DIAGNOSIS — J42 Unspecified chronic bronchitis: Secondary | ICD-10-CM | POA: Diagnosis not present

## 2024-01-07 DIAGNOSIS — J45909 Unspecified asthma, uncomplicated: Secondary | ICD-10-CM | POA: Diagnosis not present

## 2024-01-07 DIAGNOSIS — J411 Mucopurulent chronic bronchitis: Secondary | ICD-10-CM

## 2024-01-07 MED ORDER — BREZTRI AEROSPHERE 160-9-4.8 MCG/ACT IN AERO: 2.0000 | INHALATION_SPRAY | Freq: Two times a day (BID) | RESPIRATORY_TRACT | 12 refills | Status: AC

## 2024-01-07 NOTE — Patient Instructions (Signed)
 Nice to see you again  I refilled the inhaler, Breztri , 2 puffs in the morning, 2 puffs in the evening.  Rinse out your mouth after every use.  Please call and leave me a message if this is too expensive we will look for solution.  There may not be one but we can try.  Phone number is [phone].  Return to clinic in 6 months or sooner as needed with Dr. Annella

## 2024-01-08 NOTE — Progress Notes (Signed)
 @Patient  ID: Regina [name], female    DOB: [birth year], 83 y.o.   MRN: [id number]  No chief complaint on file.   Referring provider: Dyane Anthony RAMAN, FNP  HPI:   83 y.o. woman whom are seen for evaluation of chronic cough.  Most recent vascular surgery note reviewed.  Most recent cardiology note reviewed.  At last visit, prescribed Breztri .  This helped quite a bit in terms of her cough.  Significantly improved.  Less productive less frequent.  Also recommended Zyrtec and montelukast  for postnasal drip.  She is taking antihistamine but I do not think taking montelukast .  That seems to be a bit improved as well.  HPI initial visit: Overall doing okay.  She has had nagging cough for a long time.  Really daily productive cough for years.  Consistent with chronic bronchitis.  Diagnosed with asthma as a young child needs to be more symptomatic terms of shortness of breath and cough.  Gradually improved with time.  But cough is persisted.  She did smoke, age [age], average 1 pack/day.  Cough is productive.  Clear phlegm.  Worse in mornings and evenings.  Worse with seasonal changes, environmental changes prickly with pollen.  No position awakenings better or worse.  Gets better with albuterol .  No other alleviating or exacerbating factors.  Notes prior inhalers did not help.  Notably she was prescribed Symbicort  by this pulmonary practice in the past.  She did not think it helped.  She does have rhinorrhea and postnasal drip symptoms.  Again usually worsen with pollen season.  Has been taking Allegra for a long time.  Does not think it really helps.  We discussed medication changes including trialing different inhalers as well as altering rhinorrhea, antihistamine and other medications, pills to help.  Notably, PFTs 03/2019 reviewed which demonstrates normal spirometry, air trapping on lung volumes, DLCO elevated.  Chest x-ray, most recent chest imaging, 06/2023 clear with hyperinflation on  my review and interpretation.  Questionaires / Pulmonary Flowsheets:   ACT:      No data to display          MMRC:     No data to display          Epworth:      No data to display          Tests:   FENO:  No results found for: NITRICOXIDE  PFT:    Latest Ref Rng & Units 03/10/2019    2:04 PM  PFT Results  FVC-Pre L 2.53   FVC-Predicted Pre % 105   Pre FEV1/FVC % % 78   FEV1-Pre L 1.98   FEV1-Predicted Pre % 111   DLCO uncorrected ml/min/mmHg 21.90   DLCO UNC% % 126   DLVA Predicted % 150   Personally reviewed and interpreted as normal spirometry, air trapping via lung volumes, DLCO within normal limits, elevated.  WALK:      No data to display          Imaging: Personally reviewed and as per EMR discussion in this note No results found.  Lab Results: Personally reviewed CBC    Component Value Date/Time   WBC 8.6 08/07/2023 0034   RBC 3.34 (L) 08/07/2023 0034   HGB 10.5 (L) 08/07/2023 0034   HGB 14.9 02/17/2013 1321   HCT 31.2 (L) 08/07/2023 0034   HCT 43.5 02/17/2013 1321   PLT 217 08/07/2023 0034   PLT 275 02/17/2013 1321   MCV 93.4 08/07/2023 0034  MCV 91 02/17/2013 1321   MCH 31.4 08/07/2023 0034   MCHC 33.7 08/07/2023 0034   RDW 14.6 08/07/2023 0034   RDW 14.2 02/17/2013 1321   LYMPHSABS 1.4 12/14/2017 0700   LYMPHSABS 2.4 02/17/2013 1321   MONOABS 1.1 (H) 12/14/2017 0700   EOSABS 0.4 12/14/2017 0700   EOSABS 0.3 02/17/2013 1321   BASOSABS 0.1 12/14/2017 0700   BASOSABS 0.1 02/17/2013 1321    BMET    Component Value Date/Time   NA 134 (L) 08/07/2023 0034   K 4.6 08/07/2023 0034   CL 107 08/07/2023 0034   CO2 19 (L) 08/07/2023 0034   GLUCOSE 119 (H) 08/07/2023 0034   BUN 25 (H) 08/07/2023 0034   CREATININE 1.69 (H) 08/07/2023 0034   CALCIUM  9.0 08/07/2023 0034   GFRNONAA 30 (L) 08/07/2023 0034   GFRAA >60 12/17/2017 1518    BNP No results found for: BNP  ProBNP No results found for: PROBNP  Specialty  Problems       Pulmonary Problems   Asthma   Chronic cough    Allergies  Allergen Reactions   Ceclor [Cefaclor] Rash   Naproxen Hives and Swelling    Mouth swelling   Sulfa Antibiotics Nausea And Vomiting and Other (See Comments)    Syncope/headaches    Amlodipine Cough   Augmentin [Amoxicillin-Pot Clavulanate] Nausea And Vomiting    Has patient had a PCN reaction causing immediate rash, facial/tongue/throat swelling, SOB or lightheadedness with hypotension: No Has patient had a PCN reaction causing severe rash involving mucus membranes or skin necrosis: No Has patient had a PCN reaction that required hospitalization: No Has patient had a PCN reaction occurring within the last 10 years: No If all of the above answers are NO, then may proceed with Cephalosporin use.    Other Nausea And Vomiting    pain medications or opoids/Anesthesia   Valsartan Cough    Immunization History  Administered Date(s) Administered   INFLUENZA, HIGH DOSE SEASONAL PF 12/07/2017, 01/27/2019   PFIZER(Purple Top)SARS-COV-2 Vaccination 04/22/2019, 05/13/2019, 01/17/2020    Past Medical History:  Diagnosis Date   Anxiety    Arthritis    neck   Asthma    Cervical spondylolysis    Chronic kidney disease    Stage IIIB   Depression    Hashimoto's disease    Hyperlipidemia    Hypertension    Insulin  resistance    PAD (peripheral artery disease)    Polycythemia    PONV (postoperative nausea and vomiting)    hasn't had surgery since 1993   Vasovagal syncope     Tobacco History: Social History   Tobacco Use  Smoking Status Former   Current packs/day: 0.00   Types: Cigarettes   Quit date: 1982   Years since quitting: 43.7  Smokeless Tobacco Never  Tobacco Comments   Smoked 16-40ish, 1-2 ppd   Counseling given: Not Answered Tobacco comments: Smoked 16-40ish, 1-2 ppd   Continue to not smoke  Outpatient Encounter Medications as of 01/07/2024  Medication Sig   albuterol  (VENTOLIN   HFA) 108 (90 Base) MCG/ACT inhaler Inhale 2 puffs into the lungs every 6 (six) hours as needed for wheezing or shortness of breath (asthma/coughing).   ALPRAZolam  (XANAX ) 0.25 MG tablet Take 1 tablet (0.25 mg total) by mouth 3 (three) times daily as needed for anxiety.   aspirin  325 MG tablet Take 325 mg by mouth at bedtime.   atorvastatin  (LIPITOR) 20 MG tablet Take 20 mg by mouth daily.   budesonide -glycopyrrolate -formoterol  (  BREZTRI  AEROSPHERE) 160-9-4.8 MCG/ACT AERO inhaler Inhale 2 puffs into the lungs in the morning and at bedtime.   cholecalciferol (VITAMIN D3) 25 MCG (1000 UNIT) tablet Take 1,000 Units by mouth daily.   fluticasone  (FLONASE ) 50 MCG/ACT nasal spray Place 1 spray into both nostrils daily as needed for allergies.   furosemide  (LASIX ) 20 MG tablet Take 20-40 mg by mouth See admin instructions. Take 20 mg on Monday, Wednesday, Friday, Saturday and Sunday, take 40 mg on Tuesday and Thursday   gabapentin  (NEURONTIN ) 100 MG capsule Take 100 mg by mouth 2 (two) times daily. (Patient taking differently: Take 100 mg by mouth 2 (two) times daily. Per patient taking 100 mg in the morning and 300 mg at night.)   JARDIANCE  10 MG TABS tablet Take 10 mg by mouth daily.   levothyroxine  (SYNTHROID ) 75 MCG tablet Take 1 tablet (75 mcg total) by mouth daily before breakfast.   lisinopril  (ZESTRIL ) 10 MG tablet Take 10 mg by mouth daily.   Magnesium  200 MG TABS Take 200 mg by mouth daily.   metaxalone  (SKELAXIN ) 800 MG tablet Take 1 tablet (800 mg total) by mouth 3 (three) times daily as needed for muscle spasms.   omeprazole (PRILOSEC) 40 MG capsule Take 40 mg by mouth daily as needed (acid reflux).   ondansetron  (ZOFRAN -ODT) 4 MG disintegrating tablet Take 1 tablet (4 mg total) by mouth every 8 (eight) hours as needed for nausea or vomiting.   Polyethyl Glycol-Propyl Glycol (SYSTANE OP) Place 1 drop into both eyes as needed (dry eyes).   polyethylene glycol (MIRALAX  / GLYCOLAX ) 17 g packet Take  17 g by mouth daily.   promethazine-dextromethorphan (PROMETHAZINE-DM) 6.25-15 MG/5ML syrup Take 2.5 mLs by mouth at bedtime as needed for cough.   senna-docusate (SENOKOT-S) 8.6-50 MG tablet Take 1 tablet by mouth 2 (two) times daily.   traMADol  (ULTRAM ) 50 MG tablet Take 1 tablet (50 mg total) by mouth every 6 (six) hours as needed (pain 4-10).   [DISCONTINUED] budesonide -glycopyrrolate -formoterol  (BREZTRI  AEROSPHERE) 160-9-4.8 MCG/ACT AERO inhaler 4 samples (Patient taking differently: 2 puffs in the morning and at bedtime. 4 samples)   No facility-administered encounter medications on file as of 01/07/2024.     Review of Systems  Review of Systems  N/a Physical Exam  BP 137/73   Pulse 76   Temp (!) 97.5 F (36.4 C) (Oral)   Ht 5' 2 (1.575 m)   Wt 155 lb (70.3 kg)   SpO2 97%   BMI 28.35 kg/m   Wt Readings from Last 5 Encounters:  01/07/24 155 lb (70.3 kg)  11/30/23 154 lb 3.2 oz (69.9 kg)  11/20/23 151 lb 3.2 oz (68.6 kg)  10/09/23 150 lb (68 kg)  10/03/23 148 lb (67.1 kg)    BMI Readings from Last 5 Encounters:  01/07/24 28.35 kg/m  11/30/23 29.14 kg/m  11/20/23 28.57 kg/m  10/09/23 28.34 kg/m  10/03/23 27.96 kg/m     Physical Exam General: Sitting in chair, no acute distress Eyes: EOMI, no icterus Neck: Supple, no JVP Pulmonary: Clear, no work of breathing Cardiovascular: Warm, no edema Abdomen: Nondistended MSK: No tenderness, no deformity Neuro: Normal gait, no weakness Psych: Normal mood, full affect   Assessment & Plan:   Chronic bronchitis: Chronic cough productive of mucus, particularly in the evenings.  She has a 25-pack-year history.  In addition history of asthma.  Likely related to both, hard to tease out 1 over the other.  No improvement in the past per her  report with Symbicort  although this was relatively remote, 5 years ago.  Prescribed Breztri , subsequent improvement in cough.  Refilled today.  Rhinorrhea/seasonal allergies: Continue  antihistamine.     Return in about 6 months (around 07/07/2024) for f/u Dr. Annella.   Donnice JONELLE Annella, MD 01/08/2024

## 2024-01-09 ENCOUNTER — Ambulatory Visit (HOSPITAL_COMMUNITY)
Admission: RE | Admit: 2024-01-09 | Discharge: 2024-01-09 | Disposition: A | Discharge status: Home / Self Care | Source: Ambulatory Visit | Attending: Internal Medicine | Admitting: Internal Medicine

## 2024-01-09 DIAGNOSIS — I340 Nonrheumatic mitral (valve) insufficiency: Secondary | ICD-10-CM | POA: Diagnosis not present

## 2024-01-10 ENCOUNTER — Ambulatory Visit: Payer: Self-pay | Admitting: Internal Medicine

## 2024-01-10 LAB — ECHOCARDIOGRAM COMPLETE
Area-P 1/2: 4.53 cm2
S' Lateral: 2.15 cm

## 2024-01-14 ENCOUNTER — Other Ambulatory Visit: Payer: Self-pay | Admitting: Pulmonary Disease

## 2024-01-14 ENCOUNTER — Telehealth (HOSPITAL_COMMUNITY): Payer: Self-pay | Admitting: Emergency Medicine

## 2024-01-14 ENCOUNTER — Encounter (HOSPITAL_COMMUNITY): Payer: Self-pay

## 2024-01-14 NOTE — Telephone Encounter (Signed)
Attempted to call patient regarding upcoming cardiac PET appointment. Left message on voicemail with name and callback number Aidan Moten RN Navigator Cardiac Imaging [facility] [phone] Office [phone] Cell  

## 2024-01-15 ENCOUNTER — Ambulatory Visit (HOSPITAL_COMMUNITY)
Admission: RE | Admit: 2024-01-15 | Discharge: 2024-01-15 | Disposition: A | Discharge status: Home / Self Care | Source: Ambulatory Visit | Attending: Internal Medicine | Admitting: Internal Medicine

## 2024-01-15 ENCOUNTER — Telehealth: Payer: Self-pay

## 2024-01-15 DIAGNOSIS — R0602 Shortness of breath: Secondary | ICD-10-CM | POA: Insufficient documentation

## 2024-01-15 LAB — NM PET CT CARDIAC PERFUSION MULTI W/ABSOLUTE BLOODFLOW
LV dias vol: 72 mL (ref 46–106)
MBFR: 1.41
Nuc Rest EF: 74 %
Nuc Stress EF: 67 %
Peak HR: 103 {beats}/min
Rest HR: 74 {beats}/min
Rest MBF: 1.04 ml/g/min
Rest Nuclear Isotope Dose: 18.2 mCi
Rest perfusion cavity size (mL): 72 mL
Stress MBF: 1.47 ml/g/min
Stress Nuclear Isotope Dose: 18.2 mCi
Stress perfusion cavity size (mL): 75 mL
TID: 1.18

## 2024-01-15 MED ORDER — REGADENOSON 0.4 MG/5ML IV SOLN
0.4000 mg | Freq: Once | INTRAVENOUS | Status: AC
  Administered 2024-01-15: 0.4 mg via INTRAVENOUS

## 2024-01-15 MED ORDER — RUBIDIUM RB82 GENERATOR (RUBYFILL)
18.2400 | PACK | Freq: Once | INTRAVENOUS | Status: AC
  Administered 2024-01-15: 18.24 via INTRAVENOUS

## 2024-01-15 MED ORDER — REGADENOSON 0.4 MG/5ML IV SOLN
INTRAVENOUS | Status: AC
  Filled 2024-01-15: qty 5

## 2024-01-15 NOTE — Progress Notes (Signed)
 Pt. Tolerated lexi scan well.

## 2024-01-15 NOTE — Telephone Encounter (Signed)
 Pt interested in r/s her AGM but is concerned about contrast, as she states she is close to stage V kidney disease. She is going to see Dr. Tobie in November and will call us  after that appt if she wants to proceed with AGM. MD is aware and I have let her know he can try to minimize the amount of contrast and use CO2 but that she would require some. She verbalized understanding.

## 2024-01-16 ENCOUNTER — Encounter: Payer: Self-pay | Admitting: Skilled Nursing Facility1

## 2024-01-16 ENCOUNTER — Encounter: Attending: Nephrology | Admitting: Skilled Nursing Facility1

## 2024-01-16 DIAGNOSIS — N182 Chronic kidney disease, stage 2 (mild): Secondary | ICD-10-CM | POA: Insufficient documentation

## 2024-01-16 DIAGNOSIS — E1122 Type 2 diabetes mellitus with diabetic chronic kidney disease: Secondary | ICD-10-CM | POA: Insufficient documentation

## 2024-01-16 DIAGNOSIS — N183 Chronic kidney disease, stage 3 unspecified: Secondary | ICD-10-CM | POA: Diagnosis present

## 2024-01-16 DIAGNOSIS — Z713 Dietary counseling and surveillance: Secondary | ICD-10-CM | POA: Insufficient documentation

## 2024-01-16 DIAGNOSIS — E119 Type 2 diabetes mellitus without complications: Secondary | ICD-10-CM | POA: Diagnosis present

## 2024-01-16 NOTE — Progress Notes (Signed)
 Medical Nutrition Therapy  Appointment Start time:  11:03  Appointment End time:  12:05  Primary concerns today: ONLY CKD per pts request  Referral diagnosis: e11.22, n18.2  NUTRITION ASSESSMENT    Clinical Medical Hx: CKD stage 3 Medications: see list Labs: creatinine 3.2, eGFR 14, creatinine 3.2, Potassium 5.9 Notable Signs/Symptoms: tired   Lifestyle & Dietary Hx  Pt arrives with her very supportive husband.  Pt state she is not diabetic and does not understand why that is in her chart.  Pt states she does not know why she has been gaining weight.   Estimated daily fluid intake: 32 oz Supplements:  Sleep:  Stress / self-care:  Current average weekly physical activity: ADL's currently in PT  24-Hr Dietary Recall First Meal: 1-2 doughnut holes Snack:  Second Meal: peanut butter and jelly white bread   Snack: popcorn Third Meal: 2 eggs Snack: some butter on popcorn, occasional a cookie Beverages: black coffee, 1-2 16.9 ounces water, gingerale zero   NUTRITION INTERVENTION  Nutrition education (E-1) on the following topics:  Provided medical nutrition therapy regarding the role of adequate energy intake in preventing protein-energy wasting, sarcopenia, and unintentional weight loss in older adults with CKD stage 3 (per KDOQI 2020 nutrition guidelines). Reinforced importance of maintaining caloric intake of ~30 kcal/kg/day (adjusted for clinical status, weight history, and comorbidities) to preserve lean body mass and functional status. Discussed strategies to optimize oral intake including small, frequent meals; incorporation of calorie-dense, nutrient-appropriate foods (e.g., unsaturated fats, fortified foods, oral nutrition supplements if clinically indicated). Reviewed protein recommendations (0.55-0.6 g/kg/day for stable CKD stage 3 per KDOQI, adjusted as appropriate to meet nutritional needs while preventing catabolism). Emphasized minimizing intake of foods high in  sodium, phosphorus, and potassium per renal diet guidelines, with adjustments based on laboratory monitoring, while prioritizing energy adequacy. Provided written handouts on renal-appropriate meal planning and strategies for enhancing palatability and variety. Patient demonstrated comprehension via teach-back and expressed willingness to make small changes Focus more heavily on whole foods avoid ultra processed foods and beverages   Handouts Provided Include  Detailed MyPlate  Learning Style & Readiness for Change Teaching method utilized: Visual & Auditory  Demonstrated degree of understanding via: Teach Back  Barriers to learning/adherence to lifestyle change: none identified    MONITORING & EVALUATION Dietary intake, weekly physical activity  Next Steps  Patient is to call or email with any future questions or concerns.

## 2024-01-17 ENCOUNTER — Encounter: Payer: Self-pay | Admitting: Internal Medicine

## 2024-01-18 NOTE — Telephone Encounter (Signed)
 Not sure what false positive EKG was   Last EKG OK Echo looks good

## 2024-01-23 ENCOUNTER — Encounter: Payer: Self-pay | Admitting: Internal Medicine

## 2024-01-23 ENCOUNTER — Telehealth: Payer: Self-pay

## 2024-01-23 ENCOUNTER — Encounter: Payer: Self-pay | Admitting: Vascular Surgery

## 2024-01-23 NOTE — Telephone Encounter (Signed)
 Patient called to report right leg and foot swelling with numbness.  She reported no pain. Patient asking about the amount of contrast she would receive for an Abd AO.   Emphasized that the procedure can be done with CO2 but that she would still receive a minimal amount of contrast. Patient is not sure what to do so advised her to contact her nephrologist to discuss.  Sent patient a link to her MyChart for future communications.

## 2024-01-25 NOTE — Telephone Encounter (Signed)
 Records requested, received and forwarded to Dr. Okey on 01/24/24.

## 2024-01-30 ENCOUNTER — Encounter: Payer: Self-pay | Admitting: Internal Medicine

## 2024-01-30 ENCOUNTER — Telehealth: Payer: Self-pay | Admitting: Internal Medicine

## 2024-01-30 NOTE — Telephone Encounter (Signed)
 Called patient to review recent PET/CT stress tesr which was very abnormal Labs from Dr Anthony office (renal)   Cr 2.46    No records saved from [place]  Pt denies CP but notes significant fatigue, giving out   After review of stress test findings which are concerning for signifcant 3 V dz would reocmm R/L heart cath to define anatomy and pressures    Biggest risk is further decline in renal function and dialysis     Explained risks / benefits for dialysis, stroke, MI, death.  Pt understands      Will review with renal (Dr Tobie) and interventional service for  R/L heart cath with limited contrast (no LV gram) I will contact patient after discussions     If planned will need labs prior (CBC, BMET)  Get NMR panel and Lpa at same time

## 2024-02-04 ENCOUNTER — Telehealth: Payer: Self-pay

## 2024-02-04 NOTE — Telephone Encounter (Signed)
 Spoke to patient and she stated that she has an appt with nephrology (Dr. Tobie) on 11/12.  Per patient, Dr. Tobie had said no dye, unless life or death situation.  Patient will call [facility] when needed.

## 2024-02-14 NOTE — Telephone Encounter (Signed)
 Requested copy of office visit note with Dr. Tobie on 02/13/2024 for Dr. Okey to review.  OV note is being faxed to us  at [phone].  Patient has appt with APP on 02/18/24 to setup LHC with no LV Gram.

## 2024-02-17 NOTE — H&P (View-Only) (Signed)
 Cardiology Office Note    Date:  02/18/2024  ID:  Regina [name], DOB October 01, 1940, MRN [id number] PCP:  Dyane Anthony RAMAN, FNP  Cardiologist:  Vina Gull, MD  Electrophysiologist:  None   Chief Complaint: discuss cardiac catheterization  History of Present Illness: .    Regina [name] is a 83 y.o. female with visit-pertinent history of CKD stage IV, mild MR, moderate pHTN, HTN, HLD, PAD followed by vascular surgery (last intervention s/p RCFA endarterectomy), arthritis, depression, anemia by labs, prior vasovagal syncope set up by Dr. Gull to discuss proceeding with cardiac catheterization. She is originally from [facility], [place], here with her husband. She established with Dr. Gull in 11/2023 for dyspnea who recommended further cardiac testing. Due to ongoing leg pain despite endarterectomy in 08/2023, vascular surgery has raised the question of revisiting percutaneous interventions versus surgical bypass. Echo completed 01/2024 EF 65-70%, moderately elevated PASP, mild MR, mild LAE. 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 Overread showed evidence of mild chronic lung disease, + aortic atherosclerosis. Dr. Gull recommended proceeding with heart cath but recommended coordination with nephrology given her CKD stage IV. Dr. Tobie has been following her closely in recent weeks, see communication under Media. Of note, lisinopril  was not listed on their medication list but today the patient reports that this has been a chronic medication and that she has been taking this without any interruption.  She returns for follow-up today to discuss. She denies any chest pain. She reports chronic DOE. She states that her biggest functional limitation is due to chronic leg pain for which vascular intervention has not helped. She also follows with neurosurgery for this. She still gets frequent leg pain that limits her  level of exertion. She is discouraged because she used to enjoy being very active. She denies any palpitations, edema, syncope.    Labwork independently reviewed: 02/06/2024 [facility] note MG 2.3, Cr 2.41, K 4.8 01/2024 [facility] note Cr 3.20 12/2023 K 5.0, Cr 2.46, alb 4.2 scan 11/2023 CR 2.69 scan 08/2023 LDL 40, trig 72, Hgb 10.5, plt 217  ROS: .    Please see the history of present illness.  All other systems are reviewed and otherwise negative.  Studies Reviewed: [name]    EKG:  EKG is ordered today, personally reviewed, demonstrating   EKG Interpretation Date/Time:  Monday February 18 2024 14:45:03 EST Ventricular Rate:  67 PR Interval:  184 QRS Duration:  76 QT Interval:  408 QTC Calculation: 431 R Axis:   30  Text Interpretation:  with Premature atrial complexes Low voltage QRS  Nonspecific ST and T wave abnormality similar to prior Occasional PAC  Confirmed by Velena Keegan [phone]) on 02/18/2024 2:50:40 PM    CV Studies: Cardiac studies reviewed are outlined and summarized above. Otherwise please see EMR for full report.  Cardiac PET 01/2024   Medium size, moderate, fixed defect in the basal to apical inferior/inferolateral segments which is worse on stress consistent with infarction and peri-infarct ischemia. LVEF drops with stress (74%->67%). TID is present (1.18). MBFR is globally reduced. Severe coronary calcifications. Findings are high risk and concerning for multivessel disease.   LV perfusion is abnormal. There is evidence of ischemia. There is evidence of infarction. Defect 1: There is a medium defect with moderate reduction in uptake present in the apical to basal inferior and inferolateral location(s) that is partially reversible. There is abnormal wall  motion in the defect area. Consistent with infarction and peri-infarct ischemia.   Rest left ventricular function is normal. Rest EF: 74%. Stress left ventricular function is normal. Stress EF: 67%. End  diastolic cavity size is normal.   Myocardial blood flow was computed to be 1.48ml/g/min at rest and 1.47ml/g/min at stress. Global myocardial blood flow reserve was 1.41 and was highly abnormal. MBF was corrected for baseline BP/HR.   Coronary calcium  was present on the attenuation correction CT images. Severe coronary calcifications were present. Coronary calcifications were present in the left anterior descending artery, left circumflex artery and right coronary artery distribution(s).   Findings are consistent with infarction with peri-infarct ischemia. The study is high risk.  Current Reported Medications:.    Current Meds  Medication Sig   albuterol  (VENTOLIN  HFA) 108 (90 Base) MCG/ACT inhaler Inhale 2 puffs into the lungs every 6 (six) hours as needed for wheezing or shortness of breath (asthma/coughing).   ALPRAZolam  (XANAX ) 0.25 MG tablet Take 1 tablet (0.25 mg total) by mouth 3 (three) times daily as needed for anxiety.   aspirin  325 MG tablet Take 325 mg by mouth at bedtime.   atorvastatin  (LIPITOR) 20 MG tablet Take 20 mg by mouth daily.   budesonide -glycopyrrolate -formoterol  (BREZTRI  AEROSPHERE) 160-9-4.8 MCG/ACT AERO inhaler Inhale 2 puffs into the lungs in the morning and at bedtime.   cholecalciferol (VITAMIN D3) 25 MCG (1000 UNIT) tablet Take 1,000 Units by mouth daily.   fluticasone  (FLONASE ) 50 MCG/ACT nasal spray Place 1 spray into both nostrils daily as needed for allergies.   furosemide  (LASIX ) 20 MG tablet Take 20-40 mg by mouth See admin instructions. Take 20 mg on Monday, Wednesday, Friday, Saturday and Sunday, take 40 mg on Tuesday and Thursday   gabapentin  (NEURONTIN ) 100 MG capsule Take 100 mg by mouth 2 (two) times daily. (Patient taking differently: Take 100 mg by mouth daily. Per patient taking 100 mg in the morning and 300 mg at night.)   gabapentin  (NEURONTIN ) 300 MG capsule Take 300 mg by mouth daily.   JARDIANCE  10 MG TABS tablet Take 10 mg by mouth daily.    levothyroxine  (SYNTHROID ) 75 MCG tablet Take 1 tablet (75 mcg total) by mouth daily before breakfast.   lisinopril  (ZESTRIL ) 10 MG tablet Take 10 mg by mouth daily.   LOKELMA 10 g PACK packet 10 g every other day.   Magnesium  200 MG TABS Take 200 mg by mouth daily.   metaxalone  (SKELAXIN ) 800 MG tablet Take 1 tablet (800 mg total) by mouth 3 (three) times daily as needed for muscle spasms.   montelukast  (SINGULAIR ) 10 MG tablet TAKE 1 TABLET BY MOUTH AT BEDTIME   omeprazole (PRILOSEC) 40 MG capsule Take 40 mg by mouth daily as needed (acid reflux).   ondansetron  (ZOFRAN -ODT) 4 MG disintegrating tablet Take 1 tablet (4 mg total) by mouth every 8 (eight) hours as needed for nausea or vomiting.   polyethylene glycol (MIRALAX  / GLYCOLAX ) 17 g packet Take 17 g by mouth daily.   promethazine-dextromethorphan (PROMETHAZINE-DM) 6.25-15 MG/5ML syrup Take 2.5 mLs by mouth at bedtime as needed for cough.   senna-docusate (SENOKOT-S) 8.6-50 MG tablet Take 1 tablet by mouth 2 (two) times daily.   traMADol  (ULTRAM ) 50 MG tablet Take 1 tablet (50 mg total) by mouth every 6 (six) hours as needed (pain 4-10).    Physical Exam:    VS:  BP 132/62 (BP Location: Right Arm, Patient Position: Sitting, Cuff Size: Normal)   Pulse 67  Resp 16   Ht 5' 2 (1.575 m)   Wt 162 lb 12.8 oz (73.8 kg)   SpO2 98%   BMI 29.78 kg/m    Wt Readings from Last 3 Encounters:  02/18/24 162 lb 12.8 oz (73.8 kg)  01/07/24 155 lb (70.3 kg)  11/30/23 154 lb 3.2 oz (69.9 kg)    GEN: Well nourished, well developed in no acute distress NECK: No JVD; No carotid bruits CARDIAC: RRR, no murmurs, rubs, gallops RESPIRATORY:  Clear to auscultation without rales, wheezing or rhonchi  ABDOMEN: Soft, non-tender, non-distended EXTREMITIES:  No edema; No acute deformity   Asessement and Plan:.    1. 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 Okey has recommended proceeding  with cardiac catheterization for risk stratification based on highly abnormal stress test. She also may require further PV interventions as well. Per my discussion with Dr. Okey today we will request a right and left heart cath with NO LV GRAM. She has seen nephrology to discuss this as well who has been following her renal function closely. She and Dr. Tobie have discussed risk of progression to HD. She is entering the early stages of education/planning given CKD stage IV. I reiterated this risk to her today, along with general discussion of the plan for cardiac catheterization. I also personally discussed with Dr. Tobie. He recommended to aim for end of next week. She has been taking lisinopril  chronically. He advised that we go ahead and stop completely and change Lokelma to 3x/week. Per discussion with Dr. Okey, we will bring the patient in 4 hours early for IV hydration pre-cath. We will have her hold her Lasix  the day before and day of the cath. We will have her hold her Jardiance  3 days before cath per protocol. Depending on cath results/cath outcome and whether or not she will require post-cath hospitalization, will need to arrange for follow-up BMET after study timing TBD - I would ask that cathing MD reach out to arrange when result is known to relay recommendations. The patient also reports she has a history of anxiety around needles and felt she was still shaky at the time of her PV procedure. She is wondering if perhaps a radio can be played. I told her I would include in this note so that they are aware. She reports she had a cath >40 years ago for vasovagal syncope. Will get CBC, CMET today as well as NMR panel and LPa per Dr. Nada request. I did not intensify her statin today given her history of lower extremity myalgias. This can be revisited in follow-up. She is on higher dose ASA at baseline 325mg  as per her vascular surgery medication instructions.  Informed Consent   Shared Decision  Making/Informed Consent The risks [stroke (1 in 1000), death (1 in 1000), kidney failure [usually temporary] (1 in 500), bleeding (1 in 200), allergic reaction [possibly serious] (1 in 200)], benefits (diagnostic support and management of coronary artery disease) and alternatives of a cardiac catheterization were discussed in detail with Ms. Delbene and she is willing to proceed.     2. Mild MR - anticipate routine clinical follow-up with consideration of echo in 3 years if indicated if not repeated before then. Can review in follow-up.  3. Moderate PHTN - discussed with Dr. Okey. Will get RHC at time of LHC.   4. LE myalgias - being seen by both vascular surgery and neurosurgery for this. Will  obtain CK with labs.      Disposition: F/u with Dr. Okey, myself or APP 1-2 weeks post-cath.  Signed, Jalisa Sacco N Sheila Ocasio, PA-C

## 2024-02-17 NOTE — H&P (View-Only) (Signed)
 Cardiology Office Note    Date:  02/18/2024  ID:  DANARIA LARSEN, DOB October 01, 1940, MRN [id number] PCP:  Dyane Anthony RAMAN, FNP  Cardiologist:  Vina Gull, MD  Electrophysiologist:  None   Chief Complaint: discuss cardiac catheterization  History of Present Illness: .    Regina [name] is a 83 y.o. female with visit-pertinent history of CKD stage IV, mild MR, moderate pHTN, HTN, HLD, PAD followed by vascular surgery (last intervention s/p RCFA endarterectomy), arthritis, depression, anemia by labs, prior vasovagal syncope set up by Dr. Gull to discuss proceeding with cardiac catheterization. She is originally from [facility], [place], here with her husband. She established with Dr. Gull in 11/2023 for dyspnea who recommended further cardiac testing. Due to ongoing leg pain despite endarterectomy in 08/2023, vascular surgery has raised the question of revisiting percutaneous interventions versus surgical bypass. Echo completed 01/2024 EF 65-70%, moderately elevated PASP, mild MR, mild LAE. 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 Overread showed evidence of mild chronic lung disease, + aortic atherosclerosis. Dr. Gull recommended proceeding with heart cath but recommended coordination with nephrology given her CKD stage IV. Dr. Tobie has been following her closely in recent weeks, see communication under Media. Of note, lisinopril  was not listed on their medication list but today the patient reports that this has been a chronic medication and that she has been taking this without any interruption.  She returns for follow-up today to discuss. She denies any chest pain. She reports chronic DOE. She states that her biggest functional limitation is due to chronic leg pain for which vascular intervention has not helped. She also follows with neurosurgery for this. She still gets frequent leg pain that limits her  level of exertion. She is discouraged because she used to enjoy being very active. She denies any palpitations, edema, syncope.    Labwork independently reviewed: 02/06/2024 [facility] note MG 2.3, Cr 2.41, K 4.8 01/2024 [facility] note Cr 3.20 12/2023 K 5.0, Cr 2.46, alb 4.2 scan 11/2023 CR 2.69 scan 08/2023 LDL 40, trig 72, Hgb 10.5, plt 217  ROS: .    Please see the history of present illness.  All other systems are reviewed and otherwise negative.  Studies Reviewed: [name]    EKG:  EKG is ordered today, personally reviewed, demonstrating   EKG Interpretation Date/Time:  Monday February 18 2024 14:45:03 EST Ventricular Rate:  67 PR Interval:  184 QRS Duration:  76 QT Interval:  408 QTC Calculation: 431 R Axis:   30  Text Interpretation:  with Premature atrial complexes Low voltage QRS  Nonspecific ST and T wave abnormality similar to prior Occasional PAC  Confirmed by Velena Keegan [phone]) on 02/18/2024 2:50:40 PM    CV Studies: Cardiac studies reviewed are outlined and summarized above. Otherwise please see EMR for full report.  Cardiac PET 01/2024   Medium size, moderate, fixed defect in the basal to apical inferior/inferolateral segments which is worse on stress consistent with infarction and peri-infarct ischemia. LVEF drops with stress (74%->67%). TID is present (1.18). MBFR is globally reduced. Severe coronary calcifications. Findings are high risk and concerning for multivessel disease.   LV perfusion is abnormal. There is evidence of ischemia. There is evidence of infarction. Defect 1: There is a medium defect with moderate reduction in uptake present in the apical to basal inferior and inferolateral location(s) that is partially reversible. There is abnormal wall  motion in the defect area. Consistent with infarction and peri-infarct ischemia.   Rest left ventricular function is normal. Rest EF: 74%. Stress left ventricular function is normal. Stress EF: 67%. End  diastolic cavity size is normal.   Myocardial blood flow was computed to be 1.48ml/g/min at rest and 1.47ml/g/min at stress. Global myocardial blood flow reserve was 1.41 and was highly abnormal. MBF was corrected for baseline BP/HR.   Coronary calcium  was present on the attenuation correction CT images. Severe coronary calcifications were present. Coronary calcifications were present in the left anterior descending artery, left circumflex artery and right coronary artery distribution(s).   Findings are consistent with infarction with peri-infarct ischemia. The study is high risk.  Current Reported Medications:.    Current Meds  Medication Sig   albuterol  (VENTOLIN  HFA) 108 (90 Base) MCG/ACT inhaler Inhale 2 puffs into the lungs every 6 (six) hours as needed for wheezing or shortness of breath (asthma/coughing).   ALPRAZolam  (XANAX ) 0.25 MG tablet Take 1 tablet (0.25 mg total) by mouth 3 (three) times daily as needed for anxiety.   aspirin  325 MG tablet Take 325 mg by mouth at bedtime.   atorvastatin  (LIPITOR) 20 MG tablet Take 20 mg by mouth daily.   budesonide -glycopyrrolate -formoterol  (BREZTRI  AEROSPHERE) 160-9-4.8 MCG/ACT AERO inhaler Inhale 2 puffs into the lungs in the morning and at bedtime.   cholecalciferol (VITAMIN D3) 25 MCG (1000 UNIT) tablet Take 1,000 Units by mouth daily.   fluticasone  (FLONASE ) 50 MCG/ACT nasal spray Place 1 spray into both nostrils daily as needed for allergies.   furosemide  (LASIX ) 20 MG tablet Take 20-40 mg by mouth See admin instructions. Take 20 mg on Monday, Wednesday, Friday, Saturday and Sunday, take 40 mg on Tuesday and Thursday   gabapentin  (NEURONTIN ) 100 MG capsule Take 100 mg by mouth 2 (two) times daily. (Patient taking differently: Take 100 mg by mouth daily. Per patient taking 100 mg in the morning and 300 mg at night.)   gabapentin  (NEURONTIN ) 300 MG capsule Take 300 mg by mouth daily.   JARDIANCE  10 MG TABS tablet Take 10 mg by mouth daily.    levothyroxine  (SYNTHROID ) 75 MCG tablet Take 1 tablet (75 mcg total) by mouth daily before breakfast.   lisinopril  (ZESTRIL ) 10 MG tablet Take 10 mg by mouth daily.   LOKELMA 10 g PACK packet 10 g every other day.   Magnesium  200 MG TABS Take 200 mg by mouth daily.   metaxalone  (SKELAXIN ) 800 MG tablet Take 1 tablet (800 mg total) by mouth 3 (three) times daily as needed for muscle spasms.   montelukast  (SINGULAIR ) 10 MG tablet TAKE 1 TABLET BY MOUTH AT BEDTIME   omeprazole (PRILOSEC) 40 MG capsule Take 40 mg by mouth daily as needed (acid reflux).   ondansetron  (ZOFRAN -ODT) 4 MG disintegrating tablet Take 1 tablet (4 mg total) by mouth every 8 (eight) hours as needed for nausea or vomiting.   polyethylene glycol (MIRALAX  / GLYCOLAX ) 17 g packet Take 17 g by mouth daily.   promethazine-dextromethorphan (PROMETHAZINE-DM) 6.25-15 MG/5ML syrup Take 2.5 mLs by mouth at bedtime as needed for cough.   senna-docusate (SENOKOT-S) 8.6-50 MG tablet Take 1 tablet by mouth 2 (two) times daily.   traMADol  (ULTRAM ) 50 MG tablet Take 1 tablet (50 mg total) by mouth every 6 (six) hours as needed (pain 4-10).    Physical Exam:    VS:  BP 132/62 (BP Location: Right Arm, Patient Position: Sitting, Cuff Size: Normal)   Pulse 67  Resp 16   Ht 5' 2 (1.575 m)   Wt 162 lb 12.8 oz (73.8 kg)   SpO2 98%   BMI 29.78 kg/m    Wt Readings from Last 3 Encounters:  02/18/24 162 lb 12.8 oz (73.8 kg)  01/07/24 155 lb (70.3 kg)  11/30/23 154 lb 3.2 oz (69.9 kg)    GEN: Well nourished, well developed in no acute distress NECK: No JVD; No carotid bruits CARDIAC: RRR, no murmurs, rubs, gallops RESPIRATORY:  Clear to auscultation without rales, wheezing or rhonchi  ABDOMEN: Soft, non-tender, non-distended EXTREMITIES:  No edema; No acute deformity   Asessement and Plan:.    1. 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 Okey has recommended proceeding  with cardiac catheterization for risk stratification based on highly abnormal stress test. She also may require further PV interventions as well. Per my discussion with Dr. Okey today we will request a right and left heart cath with NO LV GRAM. She has seen nephrology to discuss this as well who has been following her renal function closely. She and Dr. Tobie have discussed risk of progression to HD. She is entering the early stages of education/planning given CKD stage IV. I reiterated this risk to her today, along with general discussion of the plan for cardiac catheterization. I also personally discussed with Dr. Tobie. He recommended to aim for end of next week. She has been taking lisinopril  chronically. He advised that we go ahead and stop completely and change Lokelma to 3x/week. Per discussion with Dr. Okey, we will bring the patient in 4 hours early for IV hydration pre-cath. We will have her hold her Lasix  the day before and day of the cath. We will have her hold her Jardiance  3 days before cath per protocol. Depending on cath results/cath outcome and whether or not she will require post-cath hospitalization, will need to arrange for follow-up BMET after study timing TBD - I would ask that cathing MD reach out to arrange when result is known to relay recommendations. The patient also reports she has a history of anxiety around needles and felt she was still shaky at the time of her PV procedure. She is wondering if perhaps a radio can be played. I told her I would include in this note so that they are aware. She reports she had a cath >40 years ago for vasovagal syncope. Will get CBC, CMET today as well as NMR panel and LPa per Dr. Nada request. I did not intensify her statin today given her history of lower extremity myalgias. This can be revisited in follow-up. She is on higher dose ASA at baseline 325mg  as per her vascular surgery medication instructions.  Informed Consent   Shared Decision  Making/Informed Consent The risks [stroke (1 in 1000), death (1 in 1000), kidney failure [usually temporary] (1 in 500), bleeding (1 in 200), allergic reaction [possibly serious] (1 in 200)], benefits (diagnostic support and management of coronary artery disease) and alternatives of a cardiac catheterization were discussed in detail with Ms. Delbene and she is willing to proceed.     2. Mild MR - anticipate routine clinical follow-up with consideration of echo in 3 years if indicated if not repeated before then. Can review in follow-up.  3. Moderate PHTN - discussed with Dr. Okey. Will get RHC at time of LHC.   4. LE myalgias - being seen by both vascular surgery and neurosurgery for this. Will  obtain CK with labs.      Disposition: F/u with Dr. Okey, myself or APP 1-2 weeks post-cath.  Signed, Jalisa Sacco N Sheila Ocasio, PA-C

## 2024-02-17 NOTE — Progress Notes (Unsigned)
 Cardiology Office Note    Date:  02/18/2024  ID:  DANARIA LARSEN, DOB October 01, 1940, MRN [id number] PCP:  Dyane Anthony RAMAN, FNP  Cardiologist:  Vina Gull, MD  Electrophysiologist:  None   Chief Complaint: discuss cardiac catheterization  History of Present Illness: .    Regina [name] is a 83 y.o. female with visit-pertinent history of CKD stage IV, mild MR, moderate pHTN, HTN, HLD, PAD followed by vascular surgery (last intervention s/p RCFA endarterectomy), arthritis, depression, anemia by labs, prior vasovagal syncope set up by Dr. Gull to discuss proceeding with cardiac catheterization. She is originally from [facility], [place], here with her husband. She established with Dr. Gull in 11/2023 for dyspnea who recommended further cardiac testing. Due to ongoing leg pain despite endarterectomy in 08/2023, vascular surgery has raised the question of revisiting percutaneous interventions versus surgical bypass. Echo completed 01/2024 EF 65-70%, moderately elevated PASP, mild MR, mild LAE. 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 Overread showed evidence of mild chronic lung disease, + aortic atherosclerosis. Dr. Gull recommended proceeding with heart cath but recommended coordination with nephrology given her CKD stage IV. Dr. Tobie has been following her closely in recent weeks, see communication under Media. Of note, lisinopril  was not listed on their medication list but today the patient reports that this has been a chronic medication and that she has been taking this without any interruption.  She returns for follow-up today to discuss. She denies any chest pain. She reports chronic DOE. She states that her biggest functional limitation is due to chronic leg pain for which vascular intervention has not helped. She also follows with neurosurgery for this. She still gets frequent leg pain that limits her  level of exertion. She is discouraged because she used to enjoy being very active. She denies any palpitations, edema, syncope.    Labwork independently reviewed: 02/06/2024 [facility] note MG 2.3, Cr 2.41, K 4.8 01/2024 [facility] note Cr 3.20 12/2023 K 5.0, Cr 2.46, alb 4.2 scan 11/2023 CR 2.69 scan 08/2023 LDL 40, trig 72, Hgb 10.5, plt 217  ROS: .    Please see the history of present illness.  All other systems are reviewed and otherwise negative.  Studies Reviewed: [name]    EKG:  EKG is ordered today, personally reviewed, demonstrating   EKG Interpretation Date/Time:  Monday February 18 2024 14:45:03 EST Ventricular Rate:  67 PR Interval:  184 QRS Duration:  76 QT Interval:  408 QTC Calculation: 431 R Axis:   30  Text Interpretation:  with Premature atrial complexes Low voltage QRS  Nonspecific ST and T wave abnormality similar to prior Occasional PAC  Confirmed by Velena Keegan [phone]) on 02/18/2024 2:50:40 PM    CV Studies: Cardiac studies reviewed are outlined and summarized above. Otherwise please see EMR for full report.  Cardiac PET 01/2024   Medium size, moderate, fixed defect in the basal to apical inferior/inferolateral segments which is worse on stress consistent with infarction and peri-infarct ischemia. LVEF drops with stress (74%->67%). TID is present (1.18). MBFR is globally reduced. Severe coronary calcifications. Findings are high risk and concerning for multivessel disease.   LV perfusion is abnormal. There is evidence of ischemia. There is evidence of infarction. Defect 1: There is a medium defect with moderate reduction in uptake present in the apical to basal inferior and inferolateral location(s) that is partially reversible. There is abnormal wall  motion in the defect area. Consistent with infarction and peri-infarct ischemia.   Rest left ventricular function is normal. Rest EF: 74%. Stress left ventricular function is normal. Stress EF: 67%. End  diastolic cavity size is normal.   Myocardial blood flow was computed to be 1.48ml/g/min at rest and 1.47ml/g/min at stress. Global myocardial blood flow reserve was 1.41 and was highly abnormal. MBF was corrected for baseline BP/HR.   Coronary calcium  was present on the attenuation correction CT images. Severe coronary calcifications were present. Coronary calcifications were present in the left anterior descending artery, left circumflex artery and right coronary artery distribution(s).   Findings are consistent with infarction with peri-infarct ischemia. The study is high risk.  Current Reported Medications:.    Current Meds  Medication Sig   albuterol  (VENTOLIN  HFA) 108 (90 Base) MCG/ACT inhaler Inhale 2 puffs into the lungs every 6 (six) hours as needed for wheezing or shortness of breath (asthma/coughing).   ALPRAZolam  (XANAX ) 0.25 MG tablet Take 1 tablet (0.25 mg total) by mouth 3 (three) times daily as needed for anxiety.   aspirin  325 MG tablet Take 325 mg by mouth at bedtime.   atorvastatin  (LIPITOR) 20 MG tablet Take 20 mg by mouth daily.   budesonide -glycopyrrolate -formoterol  (BREZTRI  AEROSPHERE) 160-9-4.8 MCG/ACT AERO inhaler Inhale 2 puffs into the lungs in the morning and at bedtime.   cholecalciferol (VITAMIN D3) 25 MCG (1000 UNIT) tablet Take 1,000 Units by mouth daily.   fluticasone  (FLONASE ) 50 MCG/ACT nasal spray Place 1 spray into both nostrils daily as needed for allergies.   furosemide  (LASIX ) 20 MG tablet Take 20-40 mg by mouth See admin instructions. Take 20 mg on Monday, Wednesday, Friday, Saturday and Sunday, take 40 mg on Tuesday and Thursday   gabapentin  (NEURONTIN ) 100 MG capsule Take 100 mg by mouth 2 (two) times daily. (Patient taking differently: Take 100 mg by mouth daily. Per patient taking 100 mg in the morning and 300 mg at night.)   gabapentin  (NEURONTIN ) 300 MG capsule Take 300 mg by mouth daily.   JARDIANCE  10 MG TABS tablet Take 10 mg by mouth daily.    levothyroxine  (SYNTHROID ) 75 MCG tablet Take 1 tablet (75 mcg total) by mouth daily before breakfast.   lisinopril  (ZESTRIL ) 10 MG tablet Take 10 mg by mouth daily.   LOKELMA 10 g PACK packet 10 g every other day.   Magnesium  200 MG TABS Take 200 mg by mouth daily.   metaxalone  (SKELAXIN ) 800 MG tablet Take 1 tablet (800 mg total) by mouth 3 (three) times daily as needed for muscle spasms.   montelukast  (SINGULAIR ) 10 MG tablet TAKE 1 TABLET BY MOUTH AT BEDTIME   omeprazole (PRILOSEC) 40 MG capsule Take 40 mg by mouth daily as needed (acid reflux).   ondansetron  (ZOFRAN -ODT) 4 MG disintegrating tablet Take 1 tablet (4 mg total) by mouth every 8 (eight) hours as needed for nausea or vomiting.   polyethylene glycol (MIRALAX  / GLYCOLAX ) 17 g packet Take 17 g by mouth daily.   promethazine-dextromethorphan (PROMETHAZINE-DM) 6.25-15 MG/5ML syrup Take 2.5 mLs by mouth at bedtime as needed for cough.   senna-docusate (SENOKOT-S) 8.6-50 MG tablet Take 1 tablet by mouth 2 (two) times daily.   traMADol  (ULTRAM ) 50 MG tablet Take 1 tablet (50 mg total) by mouth every 6 (six) hours as needed (pain 4-10).    Physical Exam:    VS:  BP 132/62 (BP Location: Right Arm, Patient Position: Sitting, Cuff Size: Normal)   Pulse 67  Resp 16   Ht 5' 2 (1.575 m)   Wt 162 lb 12.8 oz (73.8 kg)   SpO2 98%   BMI 29.78 kg/m    Wt Readings from Last 3 Encounters:  02/18/24 162 lb 12.8 oz (73.8 kg)  01/07/24 155 lb (70.3 kg)  11/30/23 154 lb 3.2 oz (69.9 kg)    GEN: Well nourished, well developed in no acute distress NECK: No JVD; No carotid bruits CARDIAC: RRR, no murmurs, rubs, gallops RESPIRATORY:  Clear to auscultation without rales, wheezing or rhonchi  ABDOMEN: Soft, non-tender, non-distended EXTREMITIES:  No edema; No acute deformity   Asessement and Plan:.    1. 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 Okey has recommended proceeding  with cardiac catheterization for risk stratification based on highly abnormal stress test. She also may require further PV interventions as well. Per my discussion with Dr. Okey today we will request a right and left heart cath with NO LV GRAM. She has seen nephrology to discuss this as well who has been following her renal function closely. She and Dr. Tobie have discussed risk of progression to HD. She is entering the early stages of education/planning given CKD stage IV. I reiterated this risk to her today, along with general discussion of the plan for cardiac catheterization. I also personally discussed with Dr. Tobie. He recommended to aim for end of next week. She has been taking lisinopril  chronically. He advised that we go ahead and stop completely and change Lokelma to 3x/week. Per discussion with Dr. Okey, we will bring the patient in 4 hours early for IV hydration pre-cath. We will have her hold her Lasix  the day before and day of the cath. We will have her hold her Jardiance  3 days before cath per protocol. Depending on cath results/cath outcome and whether or not she will require post-cath hospitalization, will need to arrange for follow-up BMET after study timing TBD - I would ask that cathing MD reach out to arrange when result is known to relay recommendations. The patient also reports she has a history of anxiety around needles and felt she was still shaky at the time of her PV procedure. She is wondering if perhaps a radio can be played. I told her I would include in this note so that they are aware. She reports she had a cath >40 years ago for vasovagal syncope. Will get CBC, CMET today as well as NMR panel and LPa per Dr. Nada request. I did not intensify her statin today given her history of lower extremity myalgias. This can be revisited in follow-up. She is on higher dose ASA at baseline 325mg  as per her vascular surgery medication instructions.  Informed Consent   Shared Decision  Making/Informed Consent The risks [stroke (1 in 1000), death (1 in 1000), kidney failure [usually temporary] (1 in 500), bleeding (1 in 200), allergic reaction [possibly serious] (1 in 200)], benefits (diagnostic support and management of coronary artery disease) and alternatives of a cardiac catheterization were discussed in detail with Ms. Delbene and she is willing to proceed.     2. Mild MR - anticipate routine clinical follow-up with consideration of echo in 3 years if indicated if not repeated before then. Can review in follow-up.  3. Moderate PHTN - discussed with Dr. Okey. Will get RHC at time of LHC.   4. LE myalgias - being seen by both vascular surgery and neurosurgery for this. Will  obtain CK with labs.      Disposition: F/u with Dr. Okey, myself or APP 1-2 weeks post-cath.  Signed, Jalisa Sacco N Sheila Ocasio, PA-C

## 2024-02-18 ENCOUNTER — Encounter: Payer: Self-pay | Admitting: Physician Assistant

## 2024-02-18 ENCOUNTER — Ambulatory Visit: Attending: Physician Assistant | Admitting: Physician Assistant

## 2024-02-18 VITALS — BP 132/62 | HR 67 | Resp 16 | Ht 62.0 in | Wt 162.8 lb

## 2024-02-18 DIAGNOSIS — M791 Myalgia, unspecified site: Secondary | ICD-10-CM | POA: Insufficient documentation

## 2024-02-18 DIAGNOSIS — I272 Pulmonary hypertension, unspecified: Secondary | ICD-10-CM | POA: Insufficient documentation

## 2024-02-18 DIAGNOSIS — E876 Hypokalemia: Secondary | ICD-10-CM | POA: Diagnosis present

## 2024-02-18 DIAGNOSIS — N184 Chronic kidney disease, stage 4 (severe): Secondary | ICD-10-CM | POA: Insufficient documentation

## 2024-02-18 DIAGNOSIS — R0609 Other forms of dyspnea: Secondary | ICD-10-CM | POA: Insufficient documentation

## 2024-02-18 DIAGNOSIS — I2510 Atherosclerotic heart disease of native coronary artery without angina pectoris: Secondary | ICD-10-CM | POA: Insufficient documentation

## 2024-02-18 DIAGNOSIS — I340 Nonrheumatic mitral (valve) insufficiency: Secondary | ICD-10-CM | POA: Insufficient documentation

## 2024-02-18 DIAGNOSIS — R9439 Abnormal result of other cardiovascular function study: Secondary | ICD-10-CM | POA: Insufficient documentation

## 2024-02-18 NOTE — Patient Instructions (Addendum)
 Medication Instructions:  STOP Lisinopril  DECREASE Lokelma to three times a week *If you need a refill on your cardiac medications before your next appointment, please call your pharmacy*  Lab Work: TODAY-CMET, CBC, CK, NMR, LP(a) If you have labs (blood work) drawn today and your tests are completely normal, you will receive your results only by: MyChart Message (if you have MyChart) OR A paper copy in the mail If you have any lab test that is abnormal or we need to change your treatment, we will call you to review the results.  Testing/Procedures: Your physician has requested that you have a cardiac catheterization. Cardiac catheterization is used to diagnose and/or treat various heart conditions. Doctors may recommend this procedure for a number of different reasons. The most common reason is to evaluate chest pain. Chest pain can be a symptom of coronary artery disease (CAD), and cardiac catheterization can show whether plaque is narrowing or blocking your heart's arteries. This procedure is also used to evaluate the valves, as well as measure the blood flow and oxygen levels in different parts of your heart. For further information please visit [email or web address] Please follow instruction sheet, as given.  Follow-Up: At [facility], you and your health needs are our priority.  As part of our continuing mission to provide you with exceptional heart care, our providers are all part of one team.  This team includes your primary Cardiologist (physician) and Advanced Practice Providers or APPs (Physician Assistants and Nurse Practitioners) who all work together to provide you with the care you need, when you need it.  Your next appointment:   1-2 week(s) POST CATH (02/29/24)  Provider:   Vina Gull, MD or Raphael Bring, PA or any APP   We recommend signing up for the patient portal called MyChart.  Sign up information is provided on this After Visit Summary.  MyChart is used to  connect with patients for Virtual Visits (Telemedicine).  Patients are able to view lab/test results, encounter notes, upcoming appointments, etc.  Non-urgent messages can be sent to your provider as well.   To learn more about what you can do with MyChart, go to [email or web address].   Other Instructions        Cardiac Catheterization   You are scheduled for a Cardiac Catheterization on Friday, November 28 with Dr. Peter Jordan.  1. Please arrive at the [facility] (Main Entrance A) at [facility]: [address] [place], [place] [address] at 7:30 am (This time is 4.5 hour(s) before your procedure to ensure your preparation. You will Need to arrive 4 hours before procedure so she can get IV hydration ).   Free valet parking service is available. You will check in at ADMITTING. The support person will be asked to wait in the waiting room.  It is OK to have someone drop you off and come back when you are ready to be discharged.        Special note: Every effort is made to have your procedure done on time. Please understand that emergencies sometimes delay scheduled procedures.  2. Diet: Nothing to eat after midnight.  3. Hydration:On November 28, you may drink approved liquids (see below) until 2 hours before the procedure time.     List of approved liquids water, clear juice, clear tea, black coffee, fruit juices, non-citric and without pulp, carbonated beverages, Gatorade, Kool -Aid, plain Jello-O and plain ice popsicles.  4. Labs: You will need to have blood drawn on Monday, November  17 at [facility] (1st Floor), [address], [place], [place] [address]. You do not need to be fasting.  5. Medication instructions in preparation for your procedure:   Contrast Allergy: No  HOLD Jardiance  (empagliflozin )3 days prior to your cath (02/26/2024) and do not resume until after your cath  Stop taking, Lasix  (Furosemide )  Wednesday,  November 27, HOLD THE DAY BEFORE THE CATH (02/28/2024) AND THE DAY OF THE CATH (02/29/2024)  On the morning of your procedure, take Aspirin  81 mg and any morning medicines NOT listed above.  You may use sips of water.  6. Plan to go home the same day, you will only stay overnight if medically necessary.  7. You MUST have a responsible adult to drive you home.  8. An adult MUST be with you the first 24 hours after you arrive home.  9. Bring a current list of your medications, and the last time and date medication taken.  10. Bring ID and current insurance cards.  11.Please wear clothes that are easy to get on and off and wear slip-on shoes.  Thank you for allowing us  to care for you!   -- [facility]

## 2024-02-19 ENCOUNTER — Telehealth: Payer: Self-pay

## 2024-02-19 ENCOUNTER — Ambulatory Visit: Payer: Self-pay | Admitting: Physician Assistant

## 2024-02-19 DIAGNOSIS — N184 Chronic kidney disease, stage 4 (severe): Secondary | ICD-10-CM

## 2024-02-19 LAB — COMPREHENSIVE METABOLIC PANEL WITH GFR
ALT: 13 IU/L (ref 0–32)
AST: 20 IU/L (ref 0–40)
Albumin: 4.2 g/dL (ref 3.7–4.7)
Alkaline Phosphatase: 92 IU/L (ref 48–129)
BUN/Creatinine Ratio: 21 (ref 12–28)
BUN: 45 mg/dL — AB (ref 8–27)
Bilirubin Total: 0.5 mg/dL (ref 0.0–1.2)
CO2: 20 mmol/L (ref 20–29)
Calcium: 9.2 mg/dL (ref 8.7–10.3)
Chloride: 92 mmol/L — AB (ref 96–106)
Creatinine, Ser: 2.16 mg/dL — AB (ref 0.57–1.00)
Globulin, Total: 2.1 g/dL (ref 1.5–4.5)
Glucose: 86 mg/dL (ref 70–99)
Potassium: 5.1 mmol/L (ref 3.5–5.2)
Sodium: 128 mmol/L — AB (ref 134–144)
Total Protein: 6.3 g/dL (ref 6.0–8.5)
eGFR: 22 mL/min/1.73 — AB (ref >59)

## 2024-02-19 LAB — NMR, LIPOPROFILE
Cholesterol, Total: 166 mg/dL (ref 100–199)
HDL Particle Number: 27.2 umol/L — AB (ref >=30.5)
HDL-C: 80 mg/dL (ref >39)
LDL Particle Number: 428 nmol/L (ref <1000)
LDL Size: 21.2 nm (ref >20.5)
LDL-C (NIH Calc): 75 mg/dL (ref 0–99)
LP-IR Score: <25 (ref <=45)
Small LDL Particle Number: <90 nmol/L (ref <=527)
Triglycerides: 52 mg/dL (ref 0–149)

## 2024-02-19 LAB — CBC
Hematocrit: 31.3 % — ABNORMAL LOW (ref 34.0–46.6)
Hemoglobin: 10.4 g/dL — ABNORMAL LOW (ref 11.1–15.9)
MCH: 29.7 pg (ref 26.6–33.0)
MCHC: 33.2 g/dL (ref 31.5–35.7)
MCV: 89 fL (ref 79–97)
Platelets: 323 x10E3/uL (ref 150–450)
RBC: 3.5 x10E6/uL — ABNORMAL LOW (ref 3.77–5.28)
RDW: 13.8 % (ref 11.7–15.4)
WBC: 7.5 x10E3/uL (ref 3.4–10.8)

## 2024-02-19 LAB — LIPOPROTEIN A (LPA): Lipoprotein (a): <8.4 nmol/L (ref <75.0)

## 2024-02-19 LAB — CK: Total CK: 462 U/L — AB (ref 26–161)

## 2024-02-19 NOTE — Telephone Encounter (Signed)
-----   Message from Nurse Arlean CROME sent at 02/19/2024  9:43 AM EST ----- Regarding: RE: cath fyi No-if going in early for IV hydration, I do not ask them to extra drink water (16 or 8 oz) prior to arrival at hospital. They can still have clear liquids that morning until they leave home to go to hospital.  I also ask the cath lab to make a note that they are arriving early for pre-procedure hydration. Cath Lab asks that patients going in early for hydration arrive 5 hours before the procedure to allow 1 hour for registration and IV start and ~ 4 hours IV fluids before the procedure.  Thanks! Anne ----- Message ----- From: Sebastian Janese GRADE, CMA Sent: 02/19/2024   8:49 AM EST To: Arlean CHRISTELLA Lipps, RN Subject: cath rick Walterine Arlean this patient will need 4 hour hydration prior to cath, the patient is aware and that's why her cath is scheduled at noon so she can get to the hospital by 7:30/8am to get the process started.  Does she need to drink any water prior to her coming to her arrival at the hospital?   Thanks,  [place], CMA

## 2024-02-19 NOTE — Telephone Encounter (Signed)
 Called the patient but no answer. Left a detailed message with Anne's recommendations and advised a call back with any questions or concerns.   Copy of recommendations sent to the patient via mychart.

## 2024-02-19 NOTE — Telephone Encounter (Signed)
 Spoke with pt regarding lab results. Pt verbalized understanding and agreed to get repeat labs tomorrow. Labs ordered. Pt stated she is currently taking her Lasix  20 mg two tablets daily. Pt was advised to call our office if she has any concerns.

## 2024-02-19 NOTE — Telephone Encounter (Signed)
 LVM asking pt to call our office to discuss lab results. 1st attempt

## 2024-02-19 NOTE — Addendum Note (Signed)
 Addended by: Maricus Tanzi S on: 02/19/2024 04:00 PM   Modules accepted: Orders

## 2024-02-20 LAB — BASIC METABOLIC PANEL WITH GFR
BUN/Creatinine Ratio: 20 (ref 12–28)
BUN: 41 mg/dL — ABNORMAL HIGH (ref 8–27)
CO2: 20 mmol/L (ref 20–29)
Calcium: 9 mg/dL (ref 8.7–10.3)
Chloride: 91 mmol/L — ABNORMAL LOW (ref 96–106)
Creatinine, Ser: 2.03 mg/dL — ABNORMAL HIGH (ref 0.57–1.00)
Glucose: 111 mg/dL — ABNORMAL HIGH (ref 70–99)
Potassium: 4.8 mmol/L (ref 3.5–5.2)
Sodium: 124 mmol/L — ABNORMAL LOW (ref 134–144)
eGFR: 24 mL/min/1.73 — ABNORMAL LOW (ref >59)

## 2024-02-20 NOTE — Addendum Note (Signed)
 Addended by: Jachai Okazaki S on: 02/20/2024 02:08 PM   Modules accepted: Orders

## 2024-02-20 NOTE — Telephone Encounter (Addendum)
 Spoke with pt regarding Lipitor. Pt is aware to hold Lipitor. Medication was marked not taking. Would you like for me to D/C medication and take it off her list? Pt also asked if she should be holding her Lisinopril . Please advise. Thank you

## 2024-02-20 NOTE — Telephone Encounter (Signed)
 Spoke with pt regarding medications. Pt is aware to d/c Lipitor and Lisinopril . Lipitor removed from pts med list. Pt was advise to call our office if she has any questions

## 2024-02-21 ENCOUNTER — Emergency Department (HOSPITAL_COMMUNITY)

## 2024-02-21 ENCOUNTER — Observation Stay (HOSPITAL_COMMUNITY): Admission: EM | Admit: 2024-02-21 | Discharge: 2024-02-23 | Disposition: A | Discharge status: Home / Self Care

## 2024-02-21 ENCOUNTER — Other Ambulatory Visit: Payer: Self-pay

## 2024-02-21 DIAGNOSIS — I2510 Atherosclerotic heart disease of native coronary artery without angina pectoris: Secondary | ICD-10-CM | POA: Insufficient documentation

## 2024-02-21 DIAGNOSIS — Z7982 Long term (current) use of aspirin: Secondary | ICD-10-CM | POA: Insufficient documentation

## 2024-02-21 DIAGNOSIS — J4521 Mild intermittent asthma with (acute) exacerbation: Secondary | ICD-10-CM | POA: Diagnosis not present

## 2024-02-21 DIAGNOSIS — G629 Polyneuropathy, unspecified: Secondary | ICD-10-CM

## 2024-02-21 DIAGNOSIS — E88819 Insulin resistance, unspecified: Secondary | ICD-10-CM | POA: Diagnosis present

## 2024-02-21 DIAGNOSIS — N184 Chronic kidney disease, stage 4 (severe): Secondary | ICD-10-CM | POA: Diagnosis not present

## 2024-02-21 DIAGNOSIS — F419 Anxiety disorder, unspecified: Secondary | ICD-10-CM | POA: Diagnosis not present

## 2024-02-21 DIAGNOSIS — K219 Gastro-esophageal reflux disease without esophagitis: Secondary | ICD-10-CM | POA: Insufficient documentation

## 2024-02-21 DIAGNOSIS — I272 Pulmonary hypertension, unspecified: Secondary | ICD-10-CM | POA: Diagnosis not present

## 2024-02-21 DIAGNOSIS — G609 Hereditary and idiopathic neuropathy, unspecified: Secondary | ICD-10-CM | POA: Insufficient documentation

## 2024-02-21 DIAGNOSIS — Z794 Long term (current) use of insulin: Secondary | ICD-10-CM | POA: Diagnosis not present

## 2024-02-21 DIAGNOSIS — Z87891 Personal history of nicotine dependence: Secondary | ICD-10-CM | POA: Diagnosis not present

## 2024-02-21 DIAGNOSIS — I10 Essential (primary) hypertension: Secondary | ICD-10-CM | POA: Diagnosis present

## 2024-02-21 DIAGNOSIS — D649 Anemia, unspecified: Secondary | ICD-10-CM | POA: Insufficient documentation

## 2024-02-21 DIAGNOSIS — E039 Hypothyroidism, unspecified: Secondary | ICD-10-CM | POA: Diagnosis present

## 2024-02-21 DIAGNOSIS — E871 Hypo-osmolality and hyponatremia: Secondary | ICD-10-CM | POA: Diagnosis present

## 2024-02-21 DIAGNOSIS — J45909 Unspecified asthma, uncomplicated: Secondary | ICD-10-CM | POA: Insufficient documentation

## 2024-02-21 DIAGNOSIS — E1122 Type 2 diabetes mellitus with diabetic chronic kidney disease: Secondary | ICD-10-CM | POA: Diagnosis not present

## 2024-02-21 DIAGNOSIS — I129 Hypertensive chronic kidney disease with stage 1 through stage 4 chronic kidney disease, or unspecified chronic kidney disease: Secondary | ICD-10-CM | POA: Diagnosis not present

## 2024-02-21 DIAGNOSIS — R5383 Other fatigue: Secondary | ICD-10-CM

## 2024-02-21 LAB — CBC WITH DIFFERENTIAL/PLATELET
Abs Immature Granulocytes: 0.01 K/uL (ref 0.00–0.07)
Basophils Absolute: 0.1 K/uL (ref 0.0–0.1)
Basophils Relative: 1 %
Eosinophils Absolute: 0.4 K/uL (ref 0.0–0.5)
Eosinophils Relative: 5 %
HCT: 30.7 % — ABNORMAL LOW (ref 36.0–46.0)
Hemoglobin: 10.2 g/dL — ABNORMAL LOW (ref 12.0–15.0)
Immature Granulocytes: 0 %
Lymphocytes Relative: 22 %
Lymphs Abs: 1.4 K/uL (ref 0.7–4.0)
MCH: 29.5 pg (ref 26.0–34.0)
MCHC: 33.2 g/dL (ref 30.0–36.0)
MCV: 88.7 fL (ref 80.0–100.0)
Monocytes Absolute: 0.9 K/uL (ref 0.1–1.0)
Monocytes Relative: 13 %
Neutro Abs: 3.8 K/uL (ref 1.7–7.7)
Neutrophils Relative %: 59 %
Platelets: 304 K/uL (ref 150–400)
RBC: 3.46 MIL/uL — ABNORMAL LOW (ref 3.87–5.11)
RDW: 14.3 % (ref 11.5–15.5)
WBC: 6.6 K/uL (ref 4.0–10.5)
nRBC: 0 % (ref 0.0–0.2)

## 2024-02-21 LAB — COMPREHENSIVE METABOLIC PANEL WITH GFR
ALT: 16 U/L (ref 0–44)
AST: 21 U/L (ref 15–41)
Albumin: 3.7 g/dL (ref 3.5–5.0)
Alkaline Phosphatase: 78 U/L (ref 38–126)
Anion gap: 12 (ref 5–15)
BUN: 39 mg/dL — ABNORMAL HIGH (ref 8–23)
CO2: 21 mmol/L — ABNORMAL LOW (ref 22–32)
Calcium: 8.8 mg/dL — ABNORMAL LOW (ref 8.9–10.3)
Chloride: 92 mmol/L — ABNORMAL LOW (ref 98–111)
Creatinine, Ser: 2.08 mg/dL — ABNORMAL HIGH (ref 0.44–1.00)
GFR, Estimated: 23 mL/min — ABNORMAL LOW (ref >60)
Glucose, Bld: 97 mg/dL (ref 70–99)
Potassium: 4.2 mmol/L (ref 3.5–5.1)
Sodium: 125 mmol/L — ABNORMAL LOW (ref 135–145)
Total Bilirubin: 0.7 mg/dL (ref 0.0–1.2)
Total Protein: 6.5 g/dL (ref 6.5–8.1)

## 2024-02-21 LAB — CBG MONITORING, ED: Glucose-Capillary: 136 mg/dL — ABNORMAL HIGH (ref 70–99)

## 2024-02-21 MED ORDER — HEPARIN SODIUM (PORCINE) 5000 UNIT/ML IJ SOLN
5000.0000 [IU] | Freq: Three times a day (TID) | INTRAMUSCULAR | Status: DC
  Administered 2024-02-21 – 2024-02-23 (×5): 5000 [IU] via SUBCUTANEOUS
  Filled 2024-02-21 (×5): qty 1

## 2024-02-21 MED ORDER — BUDESON-GLYCOPYRROL-FORMOTEROL 160-9-4.8 MCG/ACT IN AERO
2.0000 | INHALATION_SPRAY | Freq: Two times a day (BID) | RESPIRATORY_TRACT | Status: DC
  Administered 2024-02-22 – 2024-02-23 (×2): 2 via RESPIRATORY_TRACT
  Filled 2024-02-21: qty 5.9

## 2024-02-21 MED ORDER — SODIUM CHLORIDE 0.9 % IV BOLUS
1000.0000 mL | Freq: Once | INTRAVENOUS | Status: AC
  Administered 2024-02-21: 1000 mL via INTRAVENOUS

## 2024-02-21 MED ORDER — ALBUTEROL SULFATE (2.5 MG/3ML) 0.083% IN NEBU
2.5000 mg | INHALATION_SOLUTION | Freq: Once | RESPIRATORY_TRACT | Status: DC
  Filled 2024-02-21: qty 3

## 2024-02-21 MED ORDER — ACETAMINOPHEN 325 MG PO TABS
650.0000 mg | ORAL_TABLET | Freq: Four times a day (QID) | ORAL | Status: DC | PRN
  Filled 2024-02-21: qty 2

## 2024-02-21 MED ORDER — HYDRALAZINE HCL 20 MG/ML IJ SOLN: 5.0000 mg | INTRAMUSCULAR | Status: DC | PRN

## 2024-02-21 MED ORDER — ASPIRIN 325 MG PO TABS
325.0000 mg | ORAL_TABLET | Freq: Every day | ORAL | Status: DC
  Administered 2024-02-21 – 2024-02-22 (×2): 325 mg via ORAL
  Filled 2024-02-21 (×2): qty 1

## 2024-02-21 MED ORDER — ACETAMINOPHEN 650 MG RE SUPP: 650.0000 mg | Freq: Four times a day (QID) | RECTAL | Status: DC | PRN

## 2024-02-21 MED ORDER — INSULIN ASPART 100 UNIT/ML IJ SOLN: 0.0000 [IU] | Freq: Every day | INTRAMUSCULAR | Status: DC

## 2024-02-21 MED ORDER — INSULIN ASPART 100 UNIT/ML IJ SOLN: 0.0000 [IU] | Freq: Three times a day (TID) | INTRAMUSCULAR | Status: DC

## 2024-02-21 MED ORDER — SODIUM ZIRCONIUM CYCLOSILICATE 10 G PO PACK
10.0000 g | PACK | ORAL | Status: DC
  Administered 2024-02-22: 10 g via ORAL
  Filled 2024-02-21: qty 1

## 2024-02-21 MED ORDER — SODIUM CHLORIDE 0.9 % IV SOLN: INTRAVENOUS | Status: DC

## 2024-02-21 MED ORDER — MONTELUKAST SODIUM 10 MG PO TABS
10.0000 mg | ORAL_TABLET | Freq: Every day | ORAL | Status: DC
  Administered 2024-02-21 – 2024-02-22 (×2): 10 mg via ORAL
  Filled 2024-02-21 (×2): qty 1

## 2024-02-21 MED ORDER — FLUTICASONE PROPIONATE 50 MCG/ACT NA SUSP: 1.0000 | Freq: Every day | NASAL | Status: DC | PRN

## 2024-02-21 MED ORDER — ALBUTEROL SULFATE (2.5 MG/3ML) 0.083% IN NEBU: 2.5000 mg | INHALATION_SOLUTION | Freq: Four times a day (QID) | RESPIRATORY_TRACT | Status: DC | PRN

## 2024-02-21 MED ORDER — LEVOTHYROXINE SODIUM 75 MCG PO TABS
75.0000 ug | ORAL_TABLET | Freq: Every day | ORAL | Status: DC
  Administered 2024-02-22 – 2024-02-23 (×2): 75 ug via ORAL
  Filled 2024-02-21 (×2): qty 1

## 2024-02-21 MED ORDER — GABAPENTIN 300 MG PO CAPS
300.0000 mg | ORAL_CAPSULE | Freq: Every day | ORAL | Status: DC
  Administered 2024-02-21 – 2024-02-22 (×2): 300 mg via ORAL
  Filled 2024-02-21 (×2): qty 1

## 2024-02-21 MED ORDER — GABAPENTIN 100 MG PO CAPS
100.0000 mg | ORAL_CAPSULE | Freq: Every day | ORAL | Status: DC
  Administered 2024-02-22 – 2024-02-23 (×2): 100 mg via ORAL
  Filled 2024-02-21 (×2): qty 1

## 2024-02-21 MED ORDER — ALPRAZOLAM 0.25 MG PO TABS
0.2500 mg | ORAL_TABLET | Freq: Three times a day (TID) | ORAL | Status: DC | PRN
  Administered 2024-02-22: 0.25 mg via ORAL
  Filled 2024-02-21 (×2): qty 1

## 2024-02-21 MED ORDER — PANTOPRAZOLE SODIUM 40 MG PO TBEC
40.0000 mg | DELAYED_RELEASE_TABLET | Freq: Every day | ORAL | Status: DC
  Administered 2024-02-22 – 2024-02-23 (×2): 40 mg via ORAL
  Filled 2024-02-21 (×2): qty 1

## 2024-02-21 NOTE — ED Provider Notes (Signed)
 Regina [name]   CSN: [id number] Arrival date & time: 02/21/24  1148     Patient presents with: Abnormal Lab, Fatigue, and Chills   Regina [name] is a 83 y.o. female.   Hx of CKD stage IV, mild MR, moderate pHTN, HTN, HLD, PAD followed by vascular surgery (last intervention s/p RCFA endarterectomy), arthritis, depression, asthma, anemia by labs, prior vasovagal syncope w/ plans to undergo card cath 11/28 w/ cardiology presenting with fatigue, evaluated hyponatremia on labs that resulted yesterday, fatigue for 2-3 days reportedly.  Labs were ordered by cardiology service, known history of hyponatremia, last required admission in 2019 for similar.  Patient endorses worsening fatigue over the past week, also endorses a cough and congestion.  Denies fever or chills.  Denies nausea or vomiting.  Was recommended to present to the ED by cardiology service after labs evaluated hyponatremia, cardiology service agreeable to consult following admission if necessary.  Patient states this feels very similar to the last time she was having hyponatremia.  She called her cardiology office who recommended to present to the ED.  She denies any recent illnesses, denies fever or chills, endorses intermittent shortness of breath which she states has been controlled with albuterol  inhaler at home.   Abnormal Lab      Prior to Admission medications   Medication Sig Start Date End Date Taking? Authorizing Provider  albuterol  (VENTOLIN  HFA) 108 (90 Base) MCG/ACT inhaler Inhale 2 puffs into the lungs every 6 (six) hours as needed for wheezing or shortness of breath (asthma/coughing). 10/03/23  Yes Hunsucker, Donnice SAUNDERS, MD  ALPRAZolam  (XANAX ) 0.25 MG tablet Take 1 tablet (0.25 mg total) by mouth 3 (three) times daily as needed for anxiety. Patient taking differently: Take 0.25 mg by mouth 3 (three) times daily as needed for anxiety. Patient usually  takes one tablet at night 12/20/17  Yes Angiulli, Toribio PARAS, PA-C  aspirin  325 MG tablet Take 325 mg by mouth at bedtime.   Yes [provider]  budesonide -glycopyrrolate -formoterol  (BREZTRI  AEROSPHERE) 160-9-4.8 MCG/ACT AERO inhaler Inhale 2 puffs into the lungs in the morning and at bedtime. 01/07/24  Yes Hunsucker, Donnice SAUNDERS, MD  cholecalciferol  (VITAMIN D3) 25 MCG (1000 UNIT) tablet Take 1,000 Units by mouth daily.   Yes [provider]  fluticasone  (FLONASE ) 50 MCG/ACT nasal spray Place 1 spray into both nostrils daily as needed for allergies.   Yes [provider]  furosemide  (LASIX ) 20 MG tablet Take 40 mg by mouth in the morning. 01/26/20  Yes [provider]  gabapentin  (NEURONTIN ) 100 MG capsule Take 100 mg by mouth 2 (two) times daily. Patient taking differently: Take 100 mg by mouth daily. Per patient taking 100 and a 300 mg at night.   Yes [provider]  gabapentin  (NEURONTIN ) 300 MG capsule Take 300 mg by mouth daily.   Yes [provider]  JARDIANCE  10 MG TABS tablet Take 10 mg by mouth daily.   Yes [provider]  levothyroxine  (SYNTHROID ) 75 MCG tablet Take 1 tablet (75 mcg total) by mouth daily before breakfast. 12/20/17  Yes Angiulli, Toribio PARAS, PA-C  LOKELMA  10 g PACK packet Take 10 g by mouth 3 (three) times a week. 01/18/24  Yes [provider]  Magnesium  200 MG TABS Take 200 mg by mouth daily.   Yes [provider]  metaxalone  (SKELAXIN ) 800 MG tablet Take 1 tablet (800 mg total) by mouth 3 (three) times daily as  needed for muscle spasms. 12/20/17  Yes Angiulli, Toribio PARAS, PA-C  montelukast  (SINGULAIR ) 10 MG tablet TAKE 1 TABLET BY MOUTH AT BEDTIME 01/14/24  Yes Hunsucker, Donnice SAUNDERS, MD  omeprazole (PRILOSEC) 40 MG capsule Take 40 mg by mouth daily as needed (acid reflux). 11/11/18  Yes [provider]  ondansetron  (ZOFRAN -ODT) 4 MG disintegrating tablet Take 1 tablet (4 mg total) by mouth  every 8 (eight) hours as needed for nausea or vomiting. 08/08/23  Yes Gerome Herring M, PA-C  polyethylene glycol (MIRALAX  / GLYCOLAX ) 17 g packet Take 17 g by mouth daily as needed for mild constipation.   Yes [provider]  promethazine-dextromethorphan  (PROMETHAZINE-DM) 6.25-15 MG/5ML syrup Take 2.5 mLs by mouth at bedtime as needed for cough. 07/10/23  Yes [provider]  senna-docusate (SENOKOT-S) 8.6-50 MG tablet Take 1 tablet by mouth 2 (two) times daily. 12/20/17  Yes Angiulli, Toribio PARAS, PA-C  traMADol  (ULTRAM ) 50 MG tablet Take 1 tablet (50 mg total) by mouth every 6 (six) hours as needed (pain 4-10). 08/08/23  Yes Gerome Herring HERO, PA-C    Allergies: Ceclor [cefaclor], Naproxen, Sulfa antibiotics, Amlodipine, Augmentin [amoxicillin-pot clavulanate], Other, and Valsartan    Review of Systems  Updated Vital Signs BP (!) 153/62   Pulse 72   Temp 97.7 F (36.5 C) (Oral)   Resp 18   Ht 5' 2 (1.575 m)   Wt 73.8 kg   SpO2 100%   BMI 29.76 kg/m   Physical Exam Vitals and nursing [name] reviewed.  Constitutional:      General: She is not in acute distress.    Appearance: Normal appearance. She is not ill-appearing.  HENT:     Head: Normocephalic and atraumatic.     Mouth/Throat:     Mouth: Mucous membranes are moist.     Pharynx: Oropharynx is clear.  Eyes:     Extraocular Movements: Extraocular movements intact.     Pupils: Pupils are equal, round, and reactive to light.  Cardiovascular:     Rate and Rhythm: Normal rate and regular rhythm.     Pulses: Normal pulses.     Heart sounds: Normal heart sounds. No murmur heard.    No gallop.  Pulmonary:     Effort: No respiratory distress.     Breath sounds: No stridor. Wheezing (inferior lung fields) present. No rhonchi or rales.  Chest:     Chest wall: No tenderness.  Abdominal:     General: Abdomen is flat. There is no distension.     Palpations: Abdomen is soft.     Tenderness: There is no abdominal  tenderness. There is no right CVA tenderness, left CVA tenderness, guarding or rebound.  Musculoskeletal:     Cervical back: Neck supple. No rigidity.  Skin:    General: Skin is warm.     Capillary Refill: Capillary refill takes less than 2 seconds.  Neurological:     Mental Status: She is alert and oriented to person, place, and time. Mental status is at baseline.     Cranial Nerves: No cranial nerve deficit.     Sensory: No sensory deficit.     Motor: No weakness.     Gait: Gait normal.     (all labs ordered are listed, but only abnormal results are displayed) Labs Reviewed  CBC WITH DIFFERENTIAL/PLATELET - Abnormal; Notable for the following components:      Result Value   RBC 3.46 (*)    Hemoglobin 10.2 (*)    HCT  30.7 (*)    All other components within normal limits  COMPREHENSIVE METABOLIC PANEL WITH GFR - Abnormal; Notable for the following components:   Sodium 125 (*)    Chloride 92 (*)    CO2 21 (*)    BUN 39 (*)    Creatinine, Ser 2.08 (*)    Calcium  8.8 (*)    GFR, Estimated 23 (*)    All other components within normal limits  BASIC METABOLIC PANEL WITH GFR - Abnormal; Notable for the following components:   Sodium 130 (*)    Chloride 95 (*)    BUN 37 (*)    Creatinine, Ser 1.91 (*)    Calcium  8.8 (*)    GFR, Estimated 26 (*)    All other components within normal limits  CBG MONITORING, ED - Abnormal; Notable for the following components:   Glucose-Capillary 136 (*)    All other components within normal limits  OSMOLALITY  OSMOLALITY, URINE  SODIUM, URINE, RANDOM  BASIC METABOLIC PANEL WITH GFR  BASIC METABOLIC PANEL WITH GFR    EKG: EKG Interpretation Date/Time:  Thursday February 21 2024 13:04:08 EST Ventricular Rate:  62 PR Interval:  176 QRS Duration:  76 QT Interval:  380 QTC Calculation: 385 R Axis:   33  Text Interpretation: Sinus rhythm with marked sinus arrhythmia Low voltage QRS Nonspecific ST and T wave abnormality Compared with prior  EKG from 02/18/2024 Confirmed by Gennaro Bouchard ([id number]) on 02/21/2024 5:51:48 PM  Radiology: [facility] Chest 2 View Result Date: 02/21/2024 CLINICAL DATA:  Shortness of breath EXAM: CHEST - 2 VIEW COMPARISON:  Chest x-ray 06/14/2023 FINDINGS: The heart size and mediastinal contours are within normal limits. Both lungs are clear. The visualized skeletal structures are unremarkable. IMPRESSION: No active cardiopulmonary disease. Electronically Signed   By: Greig Pique M.D.   On: 02/21/2024 19:32     Procedures   Medications Ordered in the ED  aspirin  tablet 325 mg (325 mg Oral Given 02/21/24 2228)  ALPRAZolam  (XANAX ) tablet 0.25 mg (has no administration in time range)  levothyroxine  (SYNTHROID ) tablet 75 mcg (has no administration in time range)  pantoprazole  (PROTONIX ) EC tablet 40 mg (has no administration in time range)  sodium zirconium cyclosilicate  (LOKELMA ) packet 10 g (has no administration in time range)  gabapentin  (NEURONTIN ) capsule 100 mg (has no administration in time range)  gabapentin  (NEURONTIN ) capsule 300 mg (300 mg Oral Given 02/21/24 2228)  albuterol  (PROVENTIL ) (2.5 MG/3ML) 0.083% nebulizer solution 2.5 mg (has no administration in time range)  budesonide -glycopyrrolate -formoterol  (BREZTRI ) 160-9-4.8 MCG/ACT inhaler 2 puff (2 puffs Inhalation Not Given 02/22/24 0125)  fluticasone  (FLONASE ) 50 MCG/ACT nasal spray 1 spray (has no administration in time range)  montelukast  (SINGULAIR ) tablet 10 mg (10 mg Oral Given 02/21/24 2228)  heparin  injection 5,000 Units (5,000 Units Subcutaneous Given 02/21/24 2228)  acetaminophen  (TYLENOL ) tablet 650 mg (has no administration in time range)    Or  acetaminophen  (TYLENOL ) suppository 650 mg (has no administration in time range)  0.9 %  sodium chloride  infusion (0 mLs Intravenous Hold 02/21/24 2227)  insulin  aspart (novoLOG ) injection 0-9 Units (has no administration in time range)  insulin  aspart (novoLOG ) injection 0-5 Units (  Subcutaneous Not Given 02/21/24 2217)  hydrALAZINE  (APRESOLINE ) injection 5 mg (has no administration in time range)  sodium chloride  0.9 % bolus 1,000 mL (0 mLs Intravenous Stopped 02/21/24 2217)  Medical Decision Making Amount and/or Complexity of Data Reviewed Labs: ordered. Radiology: ordered.  Risk Prescription drug management. Decision regarding hospitalization.   Based on patient presentation, history, evaluation, high suspicion for hyponatremia, worse in comparison to prior.  Patient does have a history of developing hyponatremia, indeterminate cause for this development.  Having progressively worsening fatigue, received outpatient labs, which were reconfirmed on lab evaluation today.  Started on fluids in the ED, overall plan to admit for symptomatic hyponatremia.  Has remained hemodynamically stable and afebrile throughout time in the ED.  Patient of [name] does have a history of CKD, mildly worse in comparison to prior, will continue fluid resuscitation for mild AKI at this time as well.  Discussed with hospitalist team, agreeable to admission for symptomatic hyponatremia.     Final diagnoses:  Hyponatremia  Other fatigue  Mild intermittent asthma with exacerbation          Arlee Katz, MD 02/22/24 0141    Gennaro Duwaine CROME, DO 02/25/24 1712

## 2024-02-21 NOTE — ED Provider Triage Note (Signed)
 Emergency Medicine Provider Triage Evaluation Note  Regina [name] , a 83 y.o. female  was evaluated in triage.  Pt complains of fatigue, chills. Told Na low by PCP.  Review of Systems  Positive: Fatigue, chills Negative: N/V/D, CP, SHOB  Physical Exam  BP (!) 136/52 (BP Location: Left Arm)   Pulse 73   Temp 97.7 F (36.5 C)   Resp 18   SpO2 98%  Gen:   Awake, no distress   Resp:  Normal effort  MSK:   Moves extremities without difficulty  Other:  Labs from 11/19 show Na 124 and Cr 2.03  Medical Decision Making  Medically screening exam initiated at 12:58 PM.  Appropriate orders placed.  Trystan D Kaufman was informed that the remainder of the evaluation will be completed by another provider, this initial triage assessment does not replace that evaluation, and the importance of remaining in the ED until their evaluation is complete.  Labs and imaging ordered   Francis Ileana SAILOR, PA-C 02/21/24 1300

## 2024-02-21 NOTE — Telephone Encounter (Signed)
 See result note on 02/20/24 labs - sodium dropping to 124.  I called and spoke with patient. I am concerned that her sodium level is dropping- had admission 2019 for profound hyponatremia. Now down to 124. She has developed a cough and congestion since our OV the other day. No focal neurologic symptoms. Discussed proceeding to ER for evaluation. She is agreeable and her husband will drive her. She plans to go to [facility] since that is closer.    I will follow peripherally behind the scenes for plans for cath 02/29/24. IM team may involve cardiology if needed inpatient to clarify timing.

## 2024-02-21 NOTE — ED Triage Notes (Addendum)
 Pt. Stated, I had a call from Dr. Okey' office and they said my Sodium had dropped again. Im feeling some tired and sometimes I have chills not sure if related. My tiredness and chills has been in the last 10 days. Im scheduled for a heart cath next Friday.

## 2024-02-21 NOTE — H&P (Signed)
 History and Physical    ERTHA [name] FMW:[id number] DOB: 1940/08/21 DOA: 02/21/2024  PCP: Regina Anthony RAMAN, FNP  Patient coming from: Home  Chief Complaint: Low sodium on outpatient labs  HPI: Regina [name] is a 83 y.o. 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  Patient is scheduled for an upcoming cardiac catheterization on 02/29/2024 and had outpatient labs done yesterday which revealed sodium 124 so she was sent to the ED for further evaluation.  Sodium was 128 on outpatient labs 3 days ago.  Patient is endorsing fatigue.  She reports chronically poor appetite and history of hyponatremia in the past as well.  She takes Lasix  40 mg daily at home.  Denies abdominal pain, nausea, vomiting, or diarrhea.  Reports rhinorrhea and cough this past week which she attributes to throat irritation from postnasal drip.  Denies fevers, chest pain, or shortness of breath.  ED Course: Vital signs stable.  Labs notable for hemoglobin 10.2 (at baseline), sodium 125, chloride 92, bicarb 21, glucose 97, BUN 39, creatinine 2.0 (baseline 1.7-2.0).  Chest x-ray showing no active cardiopulmonary disease.  Patient was given 1 L normal saline.  Review of Systems:  Review of Systems  All other systems reviewed and are negative.   Past Medical History:  Diagnosis Date   Anxiety    Arthritis    neck   Asthma    Cervical spondylolysis    Chronic kidney disease    Stage IIIB   Depression    Hashimoto's disease    Hyperlipidemia    Hypertension    Insulin  resistance    PAD  (peripheral artery disease)    Polycythemia    PONV (postoperative nausea and vomiting)    hasn't had surgery since 1993   Vasovagal syncope     Past Surgical History:  Procedure Laterality Date   ABDOMINAL AORTOGRAM W/LOWER EXTREMITY N/A 07/19/2023   Procedure: ABDOMINAL AORTOGRAM W/LOWER EXTREMITY;  Surgeon: Gretta Lonni PARAS, MD;  Location: [facility];  Service: Cardiovascular;  Laterality: N/A;   ANTERIOR CERVICAL DECOMP/DISCECTOMY FUSION N/A 12/06/2017   Procedure: ANTERIOR CERVICAL DECOMPRESSION/DISCECTOMY FUSION, CERVICAL 3- CERVICAL 4, CERVICAL 4- CERVICAL 5, CERVICAL 5- CERVICAL 6;  Surgeon: Lanis Pupa, MD;  Location: [facility];  Service: Neurosurgery;  Laterality: N/A;  ANTERIOR CERVICAL DECOMPRESSION/DISCECTOMY FUSION, CERVICAL THREE- CERVICAL FOUR, CERVICAL FOUR- CERVICAL FIVE, CERVICAL FIVE- CERVICAL SIX   APPENDECTOMY     BREAST SURGERY     left biopsy   CESAREAN SECTION     x2   CHOLECYSTECTOMY     DENTAL SURGERY     teeth removal   ENDARTERECTOMY FEMORAL Right 08/06/2023   Procedure: ENDARTERECTOMY, FEMORAL, RIGHT;  Surgeon: Gretta Lonni PARAS, MD;  Location: [facility];  Service: Vascular;  Laterality: Right;   LOWER EXTREMITY ANGIOGRAPHY N/A 07/19/2023   Procedure: Lower Extremity Angiography;  Surgeon: Gretta Lonni PARAS, MD;  Location: [facility];  Service: Cardiovascular;  Laterality: N/A;   OVARY SURGERY     ovarian wedge   PATCH ANGIOPLASTY Right 08/06/2023   Procedure: PROFUNDOPLASTY, USING BOVINE PATCH GRAFT, RIGHT;  Surgeon: Gretta Lonni PARAS, MD;  Location: [facility];  Service: Vascular;  Laterality: Right;   TONSILLECTOMY  reports that she quit smoking about 43 years ago. Her smoking use included cigarettes. She has never used smokeless tobacco. She reports current alcohol use. She reports that she does not use drugs.  Allergies  Allergen Reactions   Ceclor [Cefaclor] Rash   Naproxen Hives and Swelling    Mouth swelling   Sulfa  Antibiotics Nausea And Vomiting and Other (See Comments)    Syncope/headaches    Amlodipine Cough   Augmentin [Amoxicillin-Pot Clavulanate] Nausea And Vomiting    Has patient had a PCN reaction causing immediate rash, facial/tongue/throat swelling, SOB or lightheadedness with hypotension: No Has patient had a PCN reaction causing severe rash involving mucus membranes or skin necrosis: No Has patient had a PCN reaction that required hospitalization: No Has patient had a PCN reaction occurring within the last 10 years: No If all of the above answers are NO, then may proceed with Cephalosporin use.    Other Nausea And Vomiting    pain medications or opoids/Anesthesia   Valsartan Cough    Family History  Problem Relation Age of Onset   COPD Mother    Asthma Father    Heart disease Father    Asthma Paternal Grandfather     Prior to Admission medications   Medication Sig Start Date End Date Taking? Authorizing Provider  albuterol  (VENTOLIN  HFA) 108 (90 Base) MCG/ACT inhaler Inhale 2 puffs into the lungs every 6 (six) hours as needed for wheezing or shortness of breath (asthma/coughing). 10/03/23   Hunsucker, Donnice SAUNDERS, MD  ALPRAZolam  (XANAX ) 0.25 MG tablet Take 1 tablet (0.25 mg total) by mouth 3 (three) times daily as needed for anxiety. 12/20/17   Angiulli, Toribio PARAS, PA-C  aspirin  325 MG tablet Take 325 mg by mouth at bedtime.    [provider]  budesonide -glycopyrrolate -formoterol  (BREZTRI  AEROSPHERE) 160-9-4.8 MCG/ACT AERO inhaler Inhale 2 puffs into the lungs in the morning and at bedtime. 01/07/24   Hunsucker, Donnice SAUNDERS, MD  cholecalciferol (VITAMIN D3) 25 MCG (1000 UNIT) tablet Take 1,000 Units by mouth daily.    [provider]  fluticasone  (FLONASE ) 50 MCG/ACT nasal spray Place 1 spray into both nostrils daily as needed for allergies.    [provider]  furosemide  (LASIX ) 20 MG tablet Take 20-40 mg by mouth See admin instructions. Take 20 mg on Monday,  Wednesday, Friday, Saturday and Sunday, take 40 mg on Tuesday and Thursday 01/26/20   [provider]  gabapentin  (NEURONTIN ) 100 MG capsule Take 100 mg by mouth 2 (two) times daily. Patient taking differently: Take 100 mg by mouth daily. Per patient taking 100 mg in the morning and 300 mg at night.    [provider]  gabapentin  (NEURONTIN ) 300 MG capsule Take 300 mg by mouth daily.    [provider]  JARDIANCE  10 MG TABS tablet Take 10 mg by mouth daily.    [provider]  levothyroxine  (SYNTHROID ) 75 MCG tablet Take 1 tablet (75 mcg total) by mouth daily before breakfast. 12/20/17   Angiulli, Toribio PARAS, PA-C  LOKELMA 10 g PACK packet Take 10 g by mouth 3 (three) times a week. 01/18/24   [provider]  Magnesium  200 MG TABS Take 200 mg by mouth daily.    [provider]  metaxalone  (SKELAXIN ) 800 MG tablet Take 1 tablet (800 mg total) by mouth 3 (three) times daily as needed for muscle spasms. 12/20/17   Angiulli, Toribio PARAS, PA-C  montelukast  (SINGULAIR ) 10 MG tablet TAKE 1 TABLET BY MOUTH  AT BEDTIME 01/14/24   Hunsucker, Donnice SAUNDERS, MD  omeprazole (PRILOSEC) 40 MG capsule Take 40 mg by mouth daily as needed (acid reflux). 11/11/18   [provider]  ondansetron  (ZOFRAN -ODT) 4 MG disintegrating tablet Take 1 tablet (4 mg total) by mouth every 8 (eight) hours as needed for nausea or vomiting. 08/08/23   Gerome Maurilio HERO, PA-C  polyethylene glycol (MIRALAX  / GLYCOLAX ) 17 g packet Take 17 g by mouth daily.    [provider]  promethazine-dextromethorphan (PROMETHAZINE-DM) 6.25-15 MG/5ML syrup Take 2.5 mLs by mouth at bedtime as needed for cough. 07/10/23   [provider]  senna-docusate (SENOKOT-S) 8.6-50 MG tablet Take 1 tablet by mouth 2 (two) times daily. 12/20/17   Angiulli, Toribio PARAS, PA-C  traMADol  (ULTRAM ) 50 MG tablet Take 1 tablet (50 mg total) by mouth every 6 (six) hours as needed (pain 4-10). 08/08/23   Gerome Maurilio HERO,  PA-C    Physical Exam: Vitals:   02/21/24 1902 02/21/24 1904 02/21/24 1930 02/21/24 1941  BP: (!) 112/56   (!) 140/77  Pulse: 70  81 73  Resp: 18   18  Temp:    97.6 F (36.4 C)  TempSrc:    Oral  SpO2: 100% 100% 100% 100%    Physical Exam Vitals reviewed.  Constitutional:      General: She is not in acute distress. HENT:     Head: Normocephalic and atraumatic.  Eyes:     Extraocular Movements: Extraocular movements intact.  Cardiovascular:     Rate and Rhythm: Normal rate and regular rhythm.  Pulmonary:     Effort: Pulmonary effort is normal. No respiratory distress.     Breath sounds: Normal breath sounds.  Abdominal:     General: Bowel sounds are normal.     Palpations: Abdomen is soft.     Tenderness: There is no abdominal tenderness. There is no guarding.  Musculoskeletal:     Cervical back: Normal range of motion.     Right lower leg: No edema.     Left lower leg: No edema.  Skin:    General: Skin is warm and dry.  Neurological:     General: No focal deficit present.     Mental Status: She is alert and oriented to person, place, and time.     Labs on Admission: I have personally reviewed following labs and imaging studies  CBC: Recent Labs  Lab 02/18/24 1637 02/21/24 1255  WBC 7.5 6.6  NEUTROABS  --  3.8  HGB 10.4* 10.2*  HCT 31.3* 30.7*  MCV 89 88.7  PLT 323 304   Basic Metabolic Panel: Recent Labs  Lab 02/18/24 1637 02/20/24 1330 02/21/24 1255  NA 128* 124* 125*  K 5.1 4.8 4.2  CL 92* 91* 92*  CO2 20 20 21*  GLUCOSE 86 111* 97  BUN 45* 41* 39*  CREATININE 2.16* 2.03* 2.08*  CALCIUM  9.2 9.0 8.8*   GFR: Estimated Creatinine Clearance: 19.3 mL/min (A) (by C-G formula based on SCr of 2.08 mg/dL (H)). Liver Function Tests: Recent Labs  Lab 02/18/24 1637 02/21/24 1255  AST 20 21  ALT 13 16  ALKPHOS 92 78  BILITOT 0.5 0.7  PROT 6.3 6.5  ALBUMIN  4.2 3.7   No results for input(s): LIPASE, AMYLASE in the last 168 hours. No  results for input(s): AMMONIA in the last 168 hours. Coagulation Profile: No results for input(s): INR, PROTIME in the last 168 hours. Cardiac Enzymes: Recent Labs  Lab 02/18/24  1637  CKTOTAL 462*   BNP (last 3 results) No results for input(s): PROBNP in the last 8760 hours. HbA1C: No results for input(s): HGBA1C in the last 72 hours. CBG: No results for input(s): GLUCAP in the last 168 hours. Lipid Profile: No results for input(s): CHOL, HDL, LDLCALC, TRIG, CHOLHDL, LDLDIRECT in the last 72 hours. Thyroid Function Tests: No results for input(s): TSH, T4TOTAL, FREET4, T3FREE, THYROIDAB in the last 72 hours. Anemia Panel: No results for input(s): VITAMINB12, FOLATE, FERRITIN, TIBC, IRON, RETICCTPCT in the last 72 hours. Urine analysis:    Component Value Date/Time   COLORURINE YELLOW 07/27/2023 1501   APPEARANCEUR CLEAR 07/27/2023 1501   LABSPEC 1.006 07/27/2023 1501   PHURINE 5.0 07/27/2023 1501   GLUCOSEU 50 (A) 07/27/2023 1501   HGBUR NEGATIVE 07/27/2023 1501   BILIRUBINUR NEGATIVE 07/27/2023 1501   KETONESUR NEGATIVE 07/27/2023 1501   PROTEINUR NEGATIVE 07/27/2023 1501   NITRITE NEGATIVE 07/27/2023 1501   LEUKOCYTESUR NEGATIVE 07/27/2023 1501    Radiological Exams on Admission: DG Chest 2 View Result Date: 02/21/2024 CLINICAL DATA:  Shortness of breath EXAM: CHEST - 2 VIEW COMPARISON:  Chest x-ray 06/14/2023 FINDINGS: The heart size and mediastinal contours are within normal limits. Both lungs are clear. The visualized skeletal structures are unremarkable. IMPRESSION: No active cardiopulmonary disease. Electronically Signed   By: Greig Pique M.D.   On: 02/21/2024 19:32    EKG: Independently reviewed.  Sinus rhythm with marked sinus arrhythmia, no STEMI.  Assessment and Plan  Symptomatic hyponatremia Patient is endorsing fatigue.  Sodium has dropped from 128 on outpatient labs 3 days ago to 125 today.  It was previously  134-139 on labs 6 months ago.  Patient reports poor appetite and takes Lasix  which could be contributing.  Continue IV fluid hydration with normal saline and hold Lasix .  Check serum osmolarity, urine sodium and osmolarity.  Monitor BMP every 4 hours and adjust rate of IV fluids accordingly.  Goal rate of correction 4 to 6 mEq in 24 hours.  CKD stage IV Creatinine at baseline, monitor labs.  Continue Lokelma.  Asthma/chronic bronchitis Rhinorrhea/seasonal allergies Stable, no dyspnea or wheezing.  Continue home meds.  Peripheral neuropathy Continue gabapentin .  Hypertension SBP currently 130-140.  Holding Lasix  as above.  IV hydralazine  PRN SBP >160.  Hypothyroidism Continue Synthroid .  GERD Continue PPI.  Type 2 diabetes Hemoglobin A1c 5.9 on 08/06/2023.  Placed on sensitive sliding scale insulin  ACHS.  Hold oral hypoglycemic agents at this time.  Anxiety Continue Xanax  PRN.  Chronic normocytic anemia Hemoglobin at baseline.  Multivessel CAD Moderate pulmonary hypertension Patient is seen by outpatient cardiology and is scheduled for an upcoming right/left heart cath on 02/29/2024.  She is not endorsing any anginal symptoms at this time.  Continue aspirin .  DVT prophylaxis: SQ Heparin  Code Status: Full Code (discussed with the patient) Family Communication: Husband at bedside. Level of care: Progressive Care Unit Admission status: It is my clinical opinion that referral for OBSERVATION is reasonable and necessary in this patient based on the above information provided. The aforementioned taken together are felt to place the patient at high risk for further clinical deterioration. However, it is anticipated that the patient may be medically stable for discharge from the hospital within 24 to 48 hours.  Editha Ram MD [facility]  If 7PM-7AM, please contact night-coverage [email or web address]  02/21/2024, 7:58 PM

## 2024-02-21 NOTE — ED Triage Notes (Signed)
 Sent to ER for rehydration due to Na being low and creatinine bumped. Patient is supposed to have heart cath 28th of this month.

## 2024-02-22 ENCOUNTER — Encounter (HOSPITAL_COMMUNITY): Payer: Self-pay | Admitting: Internal Medicine

## 2024-02-22 DIAGNOSIS — E871 Hypo-osmolality and hyponatremia: Secondary | ICD-10-CM | POA: Diagnosis not present

## 2024-02-22 LAB — BASIC METABOLIC PANEL WITH GFR
Anion gap: 13 (ref 5–15)
Anion gap: 16 — ABNORMAL HIGH (ref 5–15)
Anion gap: 8 (ref 5–15)
BUN: 37 mg/dL — ABNORMAL HIGH (ref 8–23)
BUN: 37 mg/dL — ABNORMAL HIGH (ref 8–23)
BUN: 38 mg/dL — ABNORMAL HIGH (ref 8–23)
CO2: 20 mmol/L — ABNORMAL LOW (ref 22–32)
CO2: 22 mmol/L (ref 22–32)
CO2: 25 mmol/L (ref 22–32)
Calcium: 8.7 mg/dL — ABNORMAL LOW (ref 8.9–10.3)
Calcium: 8.8 mg/dL — ABNORMAL LOW (ref 8.9–10.3)
Calcium: 9 mg/dL (ref 8.9–10.3)
Chloride: 95 mmol/L — ABNORMAL LOW (ref 98–111)
Chloride: 95 mmol/L — ABNORMAL LOW (ref 98–111)
Chloride: 95 mmol/L — ABNORMAL LOW (ref 98–111)
Creatinine, Ser: 1.22 mg/dL — ABNORMAL HIGH (ref 0.44–1.00)
Creatinine, Ser: 1.91 mg/dL — ABNORMAL HIGH (ref 0.44–1.00)
Creatinine, Ser: 2.03 mg/dL — ABNORMAL HIGH (ref 0.44–1.00)
GFR, Estimated: 24 mL/min — ABNORMAL LOW (ref >60)
GFR, Estimated: 26 mL/min — ABNORMAL LOW (ref >60)
GFR, Estimated: 44 mL/min — ABNORMAL LOW (ref >60)
Glucose, Bld: 91 mg/dL (ref 70–99)
Glucose, Bld: 96 mg/dL (ref 70–99)
Glucose, Bld: 98 mg/dL (ref 70–99)
Potassium: 3.8 mmol/L (ref 3.5–5.1)
Potassium: 3.9 mmol/L (ref 3.5–5.1)
Potassium: 4.3 mmol/L (ref 3.5–5.1)
Sodium: 128 mmol/L — ABNORMAL LOW (ref 135–145)
Sodium: 130 mmol/L — ABNORMAL LOW (ref 135–145)
Sodium: 131 mmol/L — ABNORMAL LOW (ref 135–145)

## 2024-02-22 LAB — CBG MONITORING, ED
Glucose-Capillary: 95 mg/dL (ref 70–99)
Glucose-Capillary: 98 mg/dL (ref 70–99)

## 2024-02-22 LAB — SODIUM: Sodium: 131 mmol/L — ABNORMAL LOW (ref 135–145)

## 2024-02-22 LAB — SODIUM, URINE, RANDOM: Sodium, Ur: 76 mmol/L

## 2024-02-22 LAB — OSMOLALITY, URINE: Osmolality, Ur: 301 mosm/kg (ref 300–900)

## 2024-02-22 LAB — GLUCOSE, CAPILLARY
Glucose-Capillary: 115 mg/dL — ABNORMAL HIGH (ref 70–99)
Glucose-Capillary: 89 mg/dL (ref 70–99)

## 2024-02-22 LAB — OSMOLALITY: Osmolality: 283 mosm/kg (ref 275–295)

## 2024-02-22 MED ORDER — MAGNESIUM OXIDE -MG SUPPLEMENT 400 (240 MG) MG PO TABS
400.0000 mg | ORAL_TABLET | Freq: Every day | ORAL | Status: DC
  Administered 2024-02-23: 400 mg via ORAL
  Filled 2024-02-22: qty 1

## 2024-02-22 MED ORDER — DEXTROMETHORPHAN POLISTIREX ER 30 MG/5ML PO SUER: 5.0000 mL | Freq: Every evening | ORAL | Status: DC | PRN

## 2024-02-22 MED ORDER — SODIUM CHLORIDE 0.9 % IV SOLN: INTRAVENOUS | Status: AC

## 2024-02-22 MED ORDER — VITAMIN D 25 MCG (1000 UNIT) PO TABS
1000.0000 [IU] | ORAL_TABLET | Freq: Every day | ORAL | Status: DC
  Administered 2024-02-23: 1000 [IU] via ORAL
  Filled 2024-02-22: qty 1

## 2024-02-22 MED ORDER — SENNOSIDES-DOCUSATE SODIUM 8.6-50 MG PO TABS
1.0000 | ORAL_TABLET | Freq: Two times a day (BID) | ORAL | Status: DC
  Administered 2024-02-22 – 2024-02-23 (×2): 1 via ORAL
  Filled 2024-02-22 (×2): qty 1

## 2024-02-22 MED ORDER — FUROSEMIDE 40 MG PO TABS
40.0000 mg | ORAL_TABLET | Freq: Every morning | ORAL | Status: DC
  Administered 2024-02-22 – 2024-02-23 (×2): 40 mg via ORAL
  Filled 2024-02-22: qty 2
  Filled 2024-02-22: qty 1

## 2024-02-22 NOTE — Progress Notes (Addendum)
 PROGRESS NOTE  Regina [name]  FMW:[id number] DOB: 1940/04/22 DOA: 02/21/2024 PCP: Dyane Anthony RAMAN, FNP   Ms. 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  02/21/2024: Patient was admitted to [facility] for chief concerns of hyponatremia and mild acute kidney injury.  11/21: I assumed care of the patient.  Serum sodium was noted to improve to 131 from 128 on presentation.  We will continue sodium chloride  from 125 mL/h to 100 mL/h, for 10 more hours.  Diet changed to renal without fluid restriction.  Assessment & Plan:   Principal Problem:   Hyponatremia Active Problems:   Hypothyroidism   Insulin  resistance   Essential hypertension   Asthma, chronic   CKD (chronic kidney disease), stage IV (HCC)   Peripheral neuropathy   Assessment and Plan:  * Hyponatremia Serum sodium is 131 today from 128 in the emergency department Sodium chloride  infusion changed to 100 mL/h, 10 hours Scheduled serum sodium recheck at 1100 hrs. today Continue close monitoring Strict I's and O's Recheck BMP in the a.m. Heart healthy/carb modified diet changed to renal diet.  Patient can have salt.  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  For wheezing and shortness of breath, asthma, coughing  Essential  hypertension Hydralazine  5 mg IV every 4 hours as needed for SBP greater 160  Insulin  resistance Patient states she does not have diabetes Home Jardiance  will not be resumed on admission Continue insulin  SSI with at bedtime coverage  Hypothyroidism Home levothyroxine  75 mcg daily before breakfast  Bilateral lower DVT prophylaxis: Heparin  5000 units subcutaneous every 8 hours Code Status: Full code Family Communication: Updated daughter, Regina [name] at bedside Disposition Plan: Pending clinical course, anticipate discharge on 11/22 Level of care: Progressive Diet: Renal, with no fluid restriction.  Patient can have salt.  No to low potassium.  Consultants:  None  Procedures:  No  Antimicrobials: No  Subjective:  At bedside, patient was able to tell me her first and last name, her age, and identify her daughter, Regina [name] at bedside.  She reports that she had no medication changes.  She reports that she does try to eat a very low to no salt diet.  She reports poor p.o. intake also over the last couple of days.  She reports she feels a little bit better now with diet.  She reports her fatigue is significantly improved.  Objective: Vitals:   02/22/24 0615 02/22/24 0900 02/22/24 1118 02/22/24 1201  BP:  (!) 126/53  (!) 152/59  Pulse: 71 70  66  Resp: 17 14  16   Temp:   (!) 97.5 F (36.4 C)   TempSrc:   Oral   SpO2: 100% 100%  100%  Weight:      Height:       No intake or output data in the 24 hours ending 02/22/24 1211 Filed Weights   02/22/24 0100  Weight: 73.8 kg   Examination:  General exam: Appears calm and comfortable  Respiratory system: Clear to auscultation. Respiratory effort normal. Cardiovascular  system: S1 & S2 heard, RRR. No JVD, murmurs, rubs, gallops or clicks.  Bilateral lower extremity 2+ pitting edema Gastrointestinal system: Abdomen is nondistended, soft and nontender. No organomegaly or masses felt. Normal bowel sounds heard. Central nervous system: Alert  and oriented. No focal neurological deficits. Extremities: Symmetric 5 x 5 power. Skin: No rashes, lesions or ulcers Psychiatry: Judgement and insight appear normal. Mood & affect appropriate.   Data Reviewed: I have personally reviewed following labs and imaging studies  CBC: Recent Labs  Lab 02/18/24 1637 02/21/24 1255  WBC 7.5 6.6  NEUTROABS  --  3.8  HGB 10.4* 10.2*  HCT 31.3* 30.7*  MCV 89 88.7  PLT 323 304   Basic Metabolic Panel: Recent Labs  Lab 02/20/24 1330 02/21/24 1255 02/22/24 0023 02/22/24 0115 02/22/24 0553  NA 124* 125* 130* 128* 131*  K 4.8 4.2 3.9 4.3 3.8  CL 91* 92* 95* 95* 95*  CO2 20 21* 22 25 20*  GLUCOSE 111* 97 91 96 98  BUN 41* 39* 37* 38* 37*  CREATININE 2.03* 2.08* 1.91* 2.03* 1.22*  CALCIUM  9.0 8.8* 8.8* 8.7* 9.0   GFR: Estimated Creatinine Clearance: 32.9 mL/min (A) (by C-G formula based on SCr of 1.22 mg/dL (H)).  Liver Function Tests: Recent Labs  Lab 02/18/24 1637 02/21/24 1255  AST 20 21  ALT 13 16  ALKPHOS 92 78  BILITOT 0.5 0.7  PROT 6.3 6.5  ALBUMIN  4.2 3.7   Cardiac Enzymes: Recent Labs  Lab 02/18/24 1637  CKTOTAL 462*   CBG: Recent Labs  Lab 02/21/24 2206 02/22/24 0739  GLUCAP 136* 95   Radiology Studies: DG Chest 2 View Result Date: 02/21/2024 CLINICAL DATA:  Shortness of breath EXAM: CHEST - 2 VIEW COMPARISON:  Chest x-ray 06/14/2023 FINDINGS: The heart size and mediastinal contours are within normal limits. Both lungs are clear. The visualized skeletal structures are unremarkable. IMPRESSION: No active cardiopulmonary disease. Electronically Signed   By: Greig Pique M.D.   On: 02/21/2024 19:32   Scheduled Meds:  aspirin   325 mg Oral QHS   budesonide -glycopyrrolate -formoterol   2 puff Inhalation BID   [START ON 02/23/2024] cholecalciferol   1,000 Units Oral Daily   [START ON 02/23/2024] furosemide   40 mg Oral q AM   gabapentin   100 mg Oral Daily   gabapentin   300 mg Oral QHS   heparin   5,000 Units  Subcutaneous Q8H   insulin  aspart  0-5 Units Subcutaneous QHS   insulin  aspart  0-9 Units Subcutaneous TID WC   levothyroxine   75 mcg Oral QAC breakfast   [START ON 02/23/2024] Magnesium   200 mg Oral Daily   montelukast   10 mg Oral QHS   pantoprazole   40 mg Oral Daily   senna-docusate  1 tablet Oral BID   sodium zirconium cyclosilicate   10 g Oral Once per day on Monday Wednesday Friday   Continuous Infusions:  sodium chloride  100 mL/hr at 02/22/24 0936    LOS: 0 days   Time spent: 35 minutes  Dr. Sherre [facility] If 7PM-7AM, please contact night-coverage 02/22/2024, 12:11 PM

## 2024-02-22 NOTE — Assessment & Plan Note (Signed)
 Versus CKD stage IIIb

## 2024-02-22 NOTE — Assessment & Plan Note (Signed)
 Home levothyroxine  75 mcg daily before breakfast

## 2024-02-22 NOTE — Assessment & Plan Note (Addendum)
 PDMP reviewed Home gabapentin  dosing have been resumed on admission

## 2024-02-22 NOTE — Assessment & Plan Note (Signed)
 Not in acute exacerbation Home inhaler dosing equivalent resumed Albuterol  inhalation every 6 hours.  For wheezing and shortness of breath, asthma, coughing

## 2024-02-22 NOTE — Assessment & Plan Note (Addendum)
 Serum sodium is 131 today from 128 in the emergency department Sodium chloride  infusion changed to 100 mL/h, 10 hours Scheduled serum sodium recheck at 1100 hrs. today Continue close monitoring Strict I's and O's Recheck BMP in the a.m. Heart healthy/carb modified diet changed to renal diet.  Patient can have salt.

## 2024-02-22 NOTE — ED Notes (Addendum)
 BG 95. PT breathing is even and unlabored. Call light within reach, encouraged to use when needs arise.  PT sitting in chair.

## 2024-02-22 NOTE — Assessment & Plan Note (Addendum)
 Patient states she does not have diabetes Home Jardiance  will not be resumed on admission Continue insulin  SSI with at bedtime coverage

## 2024-02-22 NOTE — ED Notes (Signed)
 Pt HR sustained in the low 50s, pt asymptomatic, sleeping. Franky MD notified, no new orders at this time. Per MD, notify if pt becomes symptomatic.

## 2024-02-22 NOTE — Assessment & Plan Note (Signed)
 Hydralazine  5 mg IV every 4 hours as needed for SBP greater 160

## 2024-02-22 NOTE — Hospital Course (Addendum)
 Ms. 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  02/21/2024: Patient was admitted to [facility] for chief concerns of hyponatremia and mild acute kidney injury.  11/21: I assumed care of the patient.  Serum sodium was noted to improve to 131 from 128 on presentation.  We will continue sodium chloride  from 125 mL/h to 100 mL/h, for 10 more hours.  Diet changed to renal without fluid restriction.  11/22: Nephrology was consulted to verify the patient's renal function is CKD stage IV, given renal function yesterday showing serum creatinine of 1.22/eGFR 44.  11/22: Renal ultrasound read as increased parenchymal echogenicity in both kidneys, compatible with chronic medical renal disease.  No signs of obstructive uropathy.

## 2024-02-22 NOTE — TOC Transition Note (Signed)
 Transition of Care [facility]) - Discharge Note   Patient Details  Name: Regina [name] MRN: [id number] Date of Birth: 1940/08/25  [facility] CM/SW Contact:  Waddell Barnie Rama, RN Phone Number: 02/22/2024, 3:01 PM   Clinical Narrative:    Possible dc tomorrow, has transport home.         Patient Goals and CMS Choice            Discharge Placement                       Discharge Plan and Services Additional resources added to the After Visit Summary for                                       Social Drivers of Health (SDOH) Interventions SDOH Screenings   Food Insecurity: No Food Insecurity (08/06/2023)  Housing: Low Risk  (08/06/2023)  Transportation Needs: No Transportation Needs (08/06/2023)  Utilities: Not At Risk (08/06/2023)  Social Connections: Socially Integrated (08/06/2023)  Tobacco Use: Medium Risk (02/22/2024)     Readmission Risk Interventions     No data to display

## 2024-02-22 NOTE — TOC CM/SW Note (Signed)
 Transition of Care [facility]) - Inpatient Brief Assessment   Patient Details  Name: Regina [name] MRN: [id number] Date of Birth: 1940-09-12  Transition of Care [facility]) CM/SW Contact:    Waddell Barnie Rama, RN Phone Number: 02/22/2024, 3:00 PM   Clinical Narrative: From home with spouse, has PCP and insurance on file, states has no HH services in place at this time , has walker and a cane at home.  States family member (spouse)  will transport them home at [employer] and family is support system, states gets medications from [facility] [address].  Pta self ambulatory.   There are no ICM  needs identified  at this time.  Please place consult for ICM needs.     Transition of Care Asessment: Insurance and Status: Insurance coverage has been reviewed Patient has primary care physician: Yes Home environment has been reviewed: home with spouse Prior level of function:: indep Prior/Current Home Services: Current home services (walker, cane, uses cane) Social Drivers of Health Review: SDOH reviewed no interventions necessary Readmission risk has been reviewed: Yes Transition of care needs: no transition of care needs at this time

## 2024-02-22 NOTE — ED Notes (Signed)
PT aware a urine sample is needed.

## 2024-02-23 ENCOUNTER — Observation Stay (HOSPITAL_COMMUNITY)

## 2024-02-23 DIAGNOSIS — E871 Hypo-osmolality and hyponatremia: Secondary | ICD-10-CM | POA: Diagnosis not present

## 2024-02-23 LAB — BASIC METABOLIC PANEL WITH GFR
Anion gap: 8 (ref 5–15)
BUN: 34 mg/dL — ABNORMAL HIGH (ref 8–23)
CO2: 23 mmol/L (ref 22–32)
Calcium: 8.7 mg/dL — ABNORMAL LOW (ref 8.9–10.3)
Chloride: 100 mmol/L (ref 98–111)
Creatinine, Ser: 1.98 mg/dL — ABNORMAL HIGH (ref 0.44–1.00)
GFR, Estimated: 25 mL/min — ABNORMAL LOW (ref >60)
Glucose, Bld: 81 mg/dL (ref 70–99)
Potassium: 4 mmol/L (ref 3.5–5.1)
Sodium: 131 mmol/L — ABNORMAL LOW (ref 135–145)

## 2024-02-23 LAB — CBC
HCT: 28.8 % — ABNORMAL LOW (ref 36.0–46.0)
Hemoglobin: 9.5 g/dL — ABNORMAL LOW (ref 12.0–15.0)
MCH: 29.1 pg (ref 26.0–34.0)
MCHC: 33 g/dL (ref 30.0–36.0)
MCV: 88.3 fL (ref 80.0–100.0)
Platelets: 294 K/uL (ref 150–400)
RBC: 3.26 MIL/uL — ABNORMAL LOW (ref 3.87–5.11)
RDW: 14 % (ref 11.5–15.5)
WBC: 6.8 K/uL (ref 4.0–10.5)
nRBC: 0 % (ref 0.0–0.2)

## 2024-02-23 LAB — URINALYSIS, ROUTINE W REFLEX MICROSCOPIC
Bilirubin Urine: NEGATIVE
Glucose, UA: 50 mg/dL — AB
Hgb urine dipstick: NEGATIVE
Ketones, ur: NEGATIVE mg/dL
Leukocytes,Ua: NEGATIVE
Nitrite: NEGATIVE
Protein, ur: NEGATIVE mg/dL
Specific Gravity, Urine: 1.004 — ABNORMAL LOW (ref 1.005–1.030)
pH: 6 (ref 5.0–8.0)

## 2024-02-23 LAB — GLUCOSE, CAPILLARY
Glucose-Capillary: 91 mg/dL (ref 70–99)
Glucose-Capillary: 94 mg/dL (ref 70–99)

## 2024-02-23 LAB — CREATININE, URINE, RANDOM: Creatinine, Urine: 19 mg/dL

## 2024-02-23 NOTE — Care Management Obs Status (Signed)
 MEDICARE OBSERVATION STATUS NOTIFICATION   Patient Details  Name: Regina [name] MRN: [id number] Date of Birth: [birth year]   Medicare Observation Status Notification Given:  Yes    Marval Gell, RN 02/23/2024, 7:52 AM

## 2024-02-23 NOTE — Consult Note (Signed)
 Renal Service Consult Note [facility] Lamar JONETTA Fret, MD  Patient: Regina [name] Date: 02/23/2024 Requesting Physician: Dr. Sherre  Reason for Consult: Renal failure HPI: The patient is a 83 y.o. year-old w/ PMH as below who presented to ED on 11/20/202 with complaints of sodium being low and creatinine being high.  She was feeling tired and having some chills.  She was scheduled for a heart cath next week on Friday. She is followed by Dr. Tobie at [facility] for kidney failure.  Her outpatient labs showed a sodium of 124, usually it is 130.  She has a chronically poor appetite and a history of hyponatremia in the past as well.  She has not had any nausea vomiting or diarrhea.  No fevers or you URI symptoms.  In the ED vital signs were stable, hemoglobin 10.2, sodium 125, BUN 39, creatinine 2.0.  Chest x-ray was negative.  Patient received 1 L normal saline.  Patient was admitted.  Lasix  was held.  IV fluids were given at 100 to 125 cc/h for another 24 hours.  Sodium improved up to 125 on the 20th and 131 on the 21st.  He is stable today off of IV fluids at 131.  Creatinine has been in the 1.9-2.1 range here on several labs except for 1 lab when the creatinine was 1.2.  We were asked to see the patient for the abnormally good creatinine and for the hyponatremia.   Pt seen in room.  She has no complaints at this time.  She is feeling better.  Fatigue is better. Patient states that recently her Lasix  dose was doubled to 40 mg twice daily.  She is followed by Dr. Tobie with [facility] for the last couple of years.  ROS - denies CP, no joint pain, no HA, no blurry vision, no rash, no diarrhea, no nausea/ vomiting   Past Medical History  Past Medical History:  Diagnosis Date   Anxiety    Arthritis    neck   Asthma    Cervical spondylolysis    Chronic kidney disease    Stage IIIB   Depression    Hashimoto's disease    Hyperlipidemia    Hypertension     Insulin  resistance    PAD (peripheral artery disease)    Polycythemia    PONV (postoperative nausea and vomiting)    hasn't had surgery since 1993   Vasovagal syncope    Past Surgical History  Past Surgical History:  Procedure Laterality Date   ABDOMINAL AORTOGRAM W/LOWER EXTREMITY N/A 07/19/2023   Procedure: ABDOMINAL AORTOGRAM W/LOWER EXTREMITY;  Surgeon: Gretta Lonni PARAS, MD;  Location: [facility] INVASIVE CV LAB;  Service: Cardiovascular;  Laterality: N/A;   ANTERIOR CERVICAL DECOMP/DISCECTOMY FUSION N/A 12/06/2017   Procedure: ANTERIOR CERVICAL DECOMPRESSION/DISCECTOMY FUSION, CERVICAL 3- CERVICAL 4, CERVICAL 4- CERVICAL 5, CERVICAL 5- CERVICAL 6;  Surgeon: Lanis Pupa, MD;  Location: [facility] OR;  Service: Neurosurgery;  Laterality: N/A;  ANTERIOR CERVICAL DECOMPRESSION/DISCECTOMY FUSION, CERVICAL THREE- CERVICAL FOUR, CERVICAL FOUR- CERVICAL FIVE, CERVICAL FIVE- CERVICAL SIX   APPENDECTOMY     BREAST SURGERY     left biopsy   CESAREAN SECTION     x2   CHOLECYSTECTOMY     DENTAL SURGERY     teeth removal   ENDARTERECTOMY FEMORAL Right 08/06/2023   Procedure: ENDARTERECTOMY, FEMORAL, RIGHT;  Surgeon: Gretta Lonni PARAS, MD;  Location: [facility] OR;  Service: Vascular;  Laterality: Right;   LOWER EXTREMITY ANGIOGRAPHY N/A 07/19/2023   Procedure:  Lower Extremity Angiography;  Surgeon: Gretta Lonni PARAS, MD;  Location: [facility] INVASIVE CV LAB;  Service: Cardiovascular;  Laterality: N/A;   OVARY SURGERY     ovarian wedge   PATCH ANGIOPLASTY Right 08/06/2023   Procedure: PROFUNDOPLASTY, USING BOVINE PATCH GRAFT, RIGHT;  Surgeon: Gretta Lonni PARAS, MD;  Location: [facility] OR;  Service: Vascular;  Laterality: Right;   TONSILLECTOMY     Family History  Family History  Problem Relation Age of Onset   COPD Mother    Asthma Father    Heart disease Father    Asthma Paternal Grandfather    Social History  reports that she quit smoking about 43 years ago. Her smoking use included cigarettes. She has never  used smokeless tobacco. She reports current alcohol use. She reports that she does not use drugs. Allergies  Allergies  Allergen Reactions   Ceclor [Cefaclor] Rash   Naproxen Hives and Swelling    Mouth swelling   Sulfa Antibiotics Nausea And Vomiting and Other (See Comments)    Syncope/headaches    Amlodipine Cough   Augmentin [Amoxicillin-Pot Clavulanate] Nausea And Vomiting    Has patient had a PCN reaction causing immediate rash, facial/tongue/throat swelling, SOB or lightheadedness with hypotension: No Has patient had a PCN reaction causing severe rash involving mucus membranes or skin necrosis: No Has patient had a PCN reaction that required hospitalization: No Has patient had a PCN reaction occurring within the last 10 years: No If all of the above answers are NO, then may proceed with Cephalosporin use.    Other Nausea And Vomiting    pain medications or opoids/Anesthesia   Valsartan Cough   Home medications Prior to Admission medications   Medication Sig Start Date End Date Taking? Authorizing Provider  albuterol  (VENTOLIN  HFA) 108 (90 Base) MCG/ACT inhaler Inhale 2 puffs into the lungs every 6 (six) hours as needed for wheezing or shortness of breath (asthma/coughing). 10/03/23  Yes Hunsucker, Donnice SAUNDERS, MD  ALPRAZolam  (XANAX ) 0.25 MG tablet Take 1 tablet (0.25 mg total) by mouth 3 (three) times daily as needed for anxiety. Patient taking differently: Take 0.25 mg by mouth 3 (three) times daily as needed for anxiety. Patient usually takes one tablet at night 12/20/17  Yes Angiulli, Toribio PARAS, PA-C  aspirin  325 MG tablet Take 325 mg by mouth at bedtime.   Yes [provider]  budesonide -glycopyrrolate -formoterol  (BREZTRI  AEROSPHERE) 160-9-4.8 MCG/ACT AERO inhaler Inhale 2 puffs into the lungs in the morning and at bedtime. 01/07/24  Yes Hunsucker, Donnice SAUNDERS, MD  cholecalciferol  (VITAMIN D3) 25 MCG (1000 UNIT) tablet Take 1,000 Units by mouth daily.   Yes [provider]  fluticasone  (FLONASE ) 50 MCG/ACT nasal spray Place 1 spray into both nostrils daily as needed for allergies.   Yes [provider]  furosemide  (LASIX ) 20 MG tablet Take 40 mg by mouth in the morning. 01/26/20  Yes [provider]  gabapentin  (NEURONTIN ) 100 MG capsule Take 100 mg by mouth 2 (two) times daily. Patient taking differently: Take 100 mg by mouth daily. Per patient taking 100 and a 300 mg at night.   Yes [provider]  gabapentin  (NEURONTIN ) 300 MG capsule Take 300 mg by mouth daily.   Yes [provider]  JARDIANCE  10 MG TABS tablet Take 10 mg by mouth daily.   Yes [provider]  levothyroxine  (SYNTHROID ) 75 MCG tablet Take 1 tablet (75 mcg total) by mouth daily before breakfast. 12/20/17  Yes Angiulli, Toribio PARAS, PA-C  LOKELMA  10 g PACK packet Take 10 g by mouth 3 (three) times a week. 01/18/24  Yes [provider]  Magnesium  200 MG TABS Take 200 mg by mouth daily.   Yes [provider]  metaxalone  (SKELAXIN ) 800 MG tablet Take 1 tablet (800 mg total) by mouth 3 (three) times daily as needed for muscle spasms. 12/20/17  Yes Angiulli, Toribio PARAS, PA-C  montelukast  (SINGULAIR ) 10 MG tablet TAKE 1 TABLET BY MOUTH AT BEDTIME 01/14/24  Yes Hunsucker, Donnice SAUNDERS, MD  omeprazole (PRILOSEC) 40 MG capsule Take 40 mg by mouth daily as needed (acid reflux). 11/11/18  Yes [provider]  ondansetron  (ZOFRAN -ODT) 4 MG disintegrating tablet Take 1 tablet (4 mg total) by mouth every 8 (eight) hours as needed for nausea or vomiting. 08/08/23  Yes Gerome Herring M, PA-C  polyethylene glycol (MIRALAX  / GLYCOLAX ) 17 g packet Take 17 g by mouth daily as needed for mild constipation.   Yes [provider]  promethazine-dextromethorphan  (PROMETHAZINE-DM) 6.25-15 MG/5ML syrup Take 2.5 mLs by mouth at bedtime as needed for cough. 07/10/23  Yes [provider]  senna-docusate (SENOKOT-S) 8.6-50 MG tablet Take 1  tablet by mouth 2 (two) times daily. 12/20/17  Yes Angiulli, Toribio PARAS, PA-C  traMADol  (ULTRAM ) 50 MG tablet Take 1 tablet (50 mg total) by mouth every 6 (six) hours as needed (pain 4-10). 08/08/23  Yes Gerome Herring HERO, PA-C     Vitals:   02/22/24 2147 02/22/24 2341 02/23/24 0348 02/23/24 0449  BP:  (!) 122/52 (!) 127/45   Pulse: 72 84 67   Resp: 16 14 17    Temp:  98.5 F (36.9 C) 98.2 F (36.8 C)   TempSrc:  Oral Oral   SpO2:  100% 97%   Weight:    72.3 kg  Height:       Exam Gen alert, no distress Sclera anicteric, throat clear  No jvd or bruits Chest clear bilat to bases RRR no MRG Abd soft ntnd no mass or ascites +bs Ext no LE or UE edema, no other edema Neuro is alert, Ox 3 , nf   Home bp meds: Lasix  40mg  qam   Date   Creat  eGFR (ml/min) 2019   0.70- 0.91 Feb 2025  2.07 July 2023  2.05  24 ml/min  May 2025  1.69- 1.70 30 ml/min  11/17   2.16 11/19   2.03 11/20   2.08 11/21   1.91, 2.03, 1.22 02/23/24  1.98    BP: normotensive entire admission, BP 110-150/ 55-75 IP meds of interest: Lokelma  10gm 11/21 NS 1 L bolus on 11/20, IVF 100-125 cc/hr on 11/21 No contrast, nsaids, acei/ ARB's    UA pend 11/20 --> UNa 76, UOsm 301 UCreat pend Renal US : pending CXR 11/20: no active disease Labs today --> Na 131, CO2 23 , BUN 34, creat 1.98     Assessment/ Plan: AKI on CKD 4: b/l creatinine 1.7- 2.0 from spring 2025, eGFR 24-30 ml/min. Creat here was 2.1 on admission and has stayed in the 1.9- 2.1 range here, except for one lab showed creatinine of 1.22 early am 11/21.  Today creat is back up at 1.98. Suspect this was a lab error. Bladder scan and renal US  were negative for retention. Exam shows euvolemia. Agree w/ lower dose po lasix  upon dc (back to 40mg  once daily instead of bid).  Hyponatremia: Na 124, was treated with IV NS bolus then isotonic IVFs for another 24 hrs. Na+ improved up  to 131. Lasix  po restarted yesterday at 40 mg every day. OK for dc from  renal standpoint. Have d/w pt and family.     Myer Fret  MD [facility] 02/23/2024, 8:12 AM  Recent Labs  Lab 02/22/24 0553 02/23/24 0254  CREATININE 1.22* 1.98*  K 3.8 4.0   Inpatient medications:  aspirin   325 mg Oral QHS   budesonide -glycopyrrolate -formoterol   2 puff Inhalation BID   cholecalciferol   1,000 Units Oral Daily   furosemide   40 mg Oral q AM   gabapentin   100 mg Oral Daily   gabapentin   300 mg Oral QHS   heparin   5,000 Units Subcutaneous Q8H   insulin  aspart  0-5 Units Subcutaneous QHS   insulin  aspart  0-9 Units Subcutaneous TID WC   levothyroxine   75 mcg Oral QAC breakfast   magnesium  oxide  400 mg Oral Daily   montelukast   10 mg Oral QHS   pantoprazole   40 mg Oral Daily   senna-docusate  1 tablet Oral BID   sodium zirconium cyclosilicate   10 g Oral Once per day on Monday Wednesday Friday    acetaminophen  **OR** acetaminophen , albuterol , ALPRAZolam , dextromethorphan , fluticasone , hydrALAZINE 

## 2024-02-23 NOTE — Discharge Summary (Signed)
 Physician Discharge Summary   Patient: Regina [name] MRN: [id number] DOB: 1940-07-03  Admit date:     02/21/2024  Discharge date: 02/23/24  Discharge Physician: Dr. Sherre   PCP: Dyane Anthony RAMAN, FNP   Recommendations at discharge:   Continue outpatient follow-up with your PCP within 2 weeks of discharge. Resume home furosemide  40 mg daily. Drink a good amount of water daily 8-10 cups of 8 oz per day, especially on Thanksgiving day because of your left heart catheterization scheduled the next day. Limit coffee/tea to 1 to 2 cups/day only as these are mild diuretics.  Discharge Diagnoses: Principal Problem:   Hyponatremia Active Problems:   Hypothyroidism   Insulin  resistance   Essential hypertension   Asthma, chronic   CKD (chronic kidney disease), stage IV (HCC)   Peripheral neuropathy  Resolved Problems:   * No resolved hospital problems. *  Hospital Course:  Ms. 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  02/21/2024: Patient was admitted to [facility] for chief concerns of hyponatremia and mild acute kidney injury.  11/21: I assumed care of the patient.  Serum sodium was noted to improve to 131 from 128 on presentation.  We will continue sodium chloride  from 125 mL/h to 100 mL/h, for 10 more hours.  Diet changed to renal without fluid restriction.  11/22: Nephrology was consulted to verify the patient's renal function is CKD stage IV, given renal function yesterday showing serum creatinine of 1.22/eGFR 44.  11/22: Renal ultrasound  read as increased parenchymal echogenicity in both kidneys, compatible with chronic medical renal disease.  No signs of obstructive uropathy.  Assessment and Plan:  * Hyponatremia Serum sodium is 131 today from 128 in the emergency department Sodium chloride  infusion changed to 100 mL/h, 10 hours Scheduled serum sodium recheck at 1100 hrs. today Continue close monitoring Strict I's and O's Recheck BMP in the a.m. Heart healthy/carb modified diet changed to renal diet.  Patient can have salt.  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  For wheezing and shortness of breath, asthma, coughing  Essential hypertension Hydralazine  5 mg IV every 4 hours as needed for SBP greater 160  Insulin  resistance Patient states she does not have diabetes Home Jardiance  will not be resumed on admission Continue insulin  SSI with at bedtime coverage  Hypothyroidism Home levothyroxine  75 mcg daily before breakfast     Pain control -   Controlled Substance Reporting System database was reviewed. and patient was instructed, not to drive, operate heavy machinery, perform activities at heights, swimming or participation in water activities or provide baby-sitting services while on Pain, Sleep and Anxiety Medications; until their outpatient Physician has advised to do so again. Also recommended to not to take more than prescribed Pain, Sleep and Anxiety Medications.  Consultants: Nephrology Procedures performed: No Disposition: Home Diet recommendation:  Renal diet DISCHARGE MEDICATION: Allergies as of 02/23/2024       Reactions   Ceclor [cefaclor] Rash   Naproxen Hives, Swelling   Mouth swelling   Sulfa  Antibiotics Nausea And Vomiting, Other (See  Comments)   Syncope/headaches    Amlodipine Cough   Augmentin [amoxicillin-pot Clavulanate] Nausea And Vomiting   Has patient had a PCN reaction causing immediate rash, facial/tongue/throat swelling, SOB or lightheadedness with hypotension: No Has patient had a PCN reaction causing severe rash involving mucus membranes or skin necrosis: No Has patient had a PCN reaction that required hospitalization: No Has patient had a PCN reaction occurring within the last 10 years: No If all of the above answers are NO, then may proceed with Cephalosporin use.   Other Nausea And Vomiting   pain medications or opoids/Anesthesia   Valsartan Cough        Medication List     PAUSE taking these medications    metaxalone  800 MG tablet Wait to take this until your doctor or other care provider tells you to start again. Commonly known as: SKELAXIN  Take 1 tablet (800 mg total) by mouth 3 (three) times daily as needed for muscle spasms.       STOP taking these medications    traMADol  50 MG tablet Commonly known as: ULTRAM        TAKE these medications    albuterol  108 (90 Base) MCG/ACT inhaler Commonly known as: VENTOLIN  HFA Inhale 2 puffs into the lungs every 6 (six) hours as needed for wheezing or shortness of breath (asthma/coughing).   ALPRAZolam  0.25 MG tablet Commonly known as: Xanax  Take 1 tablet (0.25 mg total) by mouth 3 (three) times daily as needed for anxiety. What changed: additional instructions   aspirin  325 MG tablet Take 325 mg by mouth at bedtime.   Breztri  Aerosphere 160-9-4.8 MCG/ACT Aero inhaler Generic drug: budesonide -glycopyrrolate -formoterol  Inhale 2 puffs into the lungs in the morning and at bedtime.   cholecalciferol  25 MCG (1000 UNIT) tablet Commonly known as: VITAMIN D3 Take 1,000 Units by mouth daily.   fluticasone  50 MCG/ACT nasal spray Commonly known as: FLONASE  Place 1 spray into both nostrils daily as needed for allergies.   furosemide  20  MG tablet Commonly known as: LASIX  Take 40 mg by mouth in the morning.   gabapentin  100 MG capsule Commonly known as: NEURONTIN  Take 100 mg by mouth 2 (two) times daily. What changed:  when to take this additional instructions   gabapentin  300 MG capsule Commonly known as: NEURONTIN  Take 300 mg by mouth daily. What changed: Another medication with the same name was changed. Make sure you understand how and when to take each.   Jardiance  10 MG Tabs tablet Generic drug: empagliflozin  Take 10 mg by mouth daily.   levothyroxine  75 MCG tablet Commonly known as: Synthroid  Take 1 tablet (75 mcg total) by mouth daily before breakfast.   Lokelma  10 g Pack packet Generic drug: sodium zirconium cyclosilicate  Take 10 g by mouth 3 (three) times a week.   Magnesium  200 MG Tabs Take 200 mg by mouth daily.   montelukast  10 MG tablet Commonly known as: SINGULAIR  TAKE 1 TABLET BY MOUTH AT BEDTIME   omeprazole 40 MG capsule Commonly known as: PRILOSEC Take 40 mg by mouth daily as needed (acid reflux).   ondansetron  4 MG disintegrating tablet Commonly known as: ZOFRAN -ODT Take 1 tablet (4 mg total) by mouth every 8 (eight) hours as needed for nausea or vomiting.   polyethylene glycol 17 g packet Commonly known as: MIRALAX  / GLYCOLAX  Take 17 g by mouth daily as needed for mild constipation.   promethazine-dextromethorphan  6.25-15 MG/5ML syrup Commonly known as: PROMETHAZINE-DM Take 2.5 mLs  by mouth at bedtime as needed for cough.   senna-docusate 8.6-50 MG tablet Commonly known as: Senokot-S Take 1 tablet by mouth 2 (two) times daily.        Follow-up Information     Dyane Anthony RAMAN, FNP Follow up on 03/03/2024.   Specialty: Family Medicine Why: 3:00 pm for hospital follow up Contact information: [address] [place] [address] [phone]               Discharge Exam: Fredricka Weights   02/22/24 0100 02/23/24 0449  Weight: 73.8 kg 72.3 kg    Physical Exam Constitutional:      Appearance: She is normal weight.  HENT:     Head: Normocephalic and atraumatic.     Right Ear: External ear normal.     Left Ear: External ear normal.     Nose: Nose normal.     Mouth/Throat:     Mouth: Mucous membranes are moist.  Eyes:     Extraocular Movements: Extraocular movements intact.     Pupils: Pupils are equal, round, and reactive to light.  Cardiovascular:     Rate and Rhythm: Normal rate and regular rhythm.     Heart sounds: No murmur heard. Pulmonary:     Effort: Pulmonary effort is normal.     Breath sounds: Normal breath sounds.  Abdominal:     General: Abdomen is flat. Bowel sounds are normal.  Musculoskeletal:     Cervical back: Normal range of motion.     Right lower leg: No edema.     Left lower leg: No edema.  Skin:    General: Skin is warm and dry.     Capillary Refill: Capillary refill takes less than 2 seconds.  Neurological:     General: No focal deficit present.     Mental Status: She is alert and oriented to person, place, and time. Mental status is at baseline.  Psychiatric:        Mood and Affect: Mood normal.        Behavior: Behavior normal.   Condition at discharge: good  The results of significant diagnostics from this hospitalization (including imaging, microbiology, ancillary and laboratory) are listed below for reference.   Imaging Studies: US  RENAL Result Date: 02/23/2024 EXAM: US  Retroperitoneum Complete, Renal. 02/23/2024 10:06:37 AM TECHNIQUE: Real-time ultrasonography of the retroperitoneum renal was performed. COMPARISON: None available CLINICAL HISTORY: [id number] Acute renal failure superimposed on stage 4 chronic kidney disease (HCC) 8596368 Acute renal failure superimposed on stage 4 chronic kidney disease (HCC) FINDINGS: FINDINGS: RIGHT KIDNEY/URETER: Right kidney measures 8.8 x 4.3 x 4.5 cm with a volume of 89.04 cc. Increased parenchymal echogenicity. There are cysts in the interpolar and  lower pole of the right kidney. The largest is in the interpolar region, measuring 1.8 x 1.4 x 1.3 cm. No hydronephrosis. No calculus. No mass. LEFT KIDNEY/URETER: Left kidney measures 1.0 x 5.2 x 3.7 cm with a volume of 101.4 cc. Increased parenchymal echogenicity. Lower pole cyst measures 0.8 x 0.8 x 0.6 cm. No hydronephrosis. No calculus. No mass. BLADDER: The bladder appears normal with bilateral ureteral jets visualized. IMPRESSION: 1. Increased parenchymal echogenicity in both kidneys, compatible with chronic medical renal disease. 2. No signs of obstructive uropathy. Hey siri turn on the espresso machine. Electronically signed by: Waddell Calk MD 02/23/2024 10:28 AM EST RP Workstation: [id number]   DG Chest 2 View Result Date: 02/21/2024 CLINICAL DATA:  Shortness of breath EXAM: CHEST - 2 VIEW  COMPARISON:  Chest x-ray 06/14/2023 FINDINGS: The heart size and mediastinal contours are within normal limits. Both lungs are clear. The visualized skeletal structures are unremarkable. IMPRESSION: No active cardiopulmonary disease. Electronically Signed   By: Greig Pique M.D.   On: 02/21/2024 19:32    Microbiology: Results for orders placed or performed during the hospital encounter of 05/22/23  Resp panel by RT-PCR (RSV, Flu A&B, Covid) Anterior Nasal Swab     Status: None   Collection Time: 05/23/23 12:03 AM   Specimen: Anterior Nasal Swab  Result Value Ref Range Status   SARS Coronavirus 2 by RT PCR NEGATIVE NEGATIVE Final    Comment: (NOTE) SARS-CoV-2 target nucleic acids are NOT DETECTED.  The SARS-CoV-2 RNA is generally detectable in upper respiratory specimens during the acute phase of infection. The lowest concentration of SARS-CoV-2 viral copies this assay can detect is 138 copies/mL. A negative result does not preclude SARS-Cov-2 infection and should not be used as the sole basis for treatment or other patient management decisions. A negative result may occur with  improper specimen  collection/handling, submission of specimen other than nasopharyngeal swab, presence of viral mutation(s) within the areas targeted by this assay, and inadequate number of viral copies(<138 copies/mL). A negative result must be combined with clinical observations, patient history, and epidemiological information. The expected result is Negative.  Fact Sheet for Patients:  bloggercourse.com  Fact Sheet for Healthcare Providers:  seriousbroker.it  This test is no t yet approved or cleared by the [place]  FDA and  has been authorized for detection and/or diagnosis of SARS-CoV-2 by FDA under an Emergency Use Authorization (EUA). This EUA will remain  in effect (meaning this test can be used) for the duration of the COVID-19 declaration under Section 564(b)(1) of the Act, 21 U.S.C.section 360bbb-3(b)(1), unless the authorization is terminated  or revoked sooner.       Influenza A by PCR NEGATIVE NEGATIVE Final   Influenza B by PCR NEGATIVE NEGATIVE Final    Comment: (NOTE) The Xpert Xpress SARS-CoV-2/FLU/RSV plus assay is intended as an aid in the diagnosis of influenza from Nasopharyngeal swab specimens and should not be used as a sole basis for treatment. Nasal washings and aspirates are unacceptable for Xpert Xpress SARS-CoV-2/FLU/RSV testing.  Fact Sheet for Patients: bloggercourse.com  Fact Sheet for Healthcare Providers: seriousbroker.it  This test is not yet approved or cleared by the [place]  FDA and has been authorized for detection and/or diagnosis of SARS-CoV-2 by FDA under an Emergency Use Authorization (EUA). This EUA will remain in effect (meaning this test can be used) for the duration of the COVID-19 declaration under Section 564(b)(1) of the Act, 21 U.S.C. section 360bbb-3(b)(1), unless the authorization is terminated or revoked.     Resp Syncytial  Virus by PCR NEGATIVE NEGATIVE Final    Comment: (NOTE) Fact Sheet for Patients: bloggercourse.com  Fact Sheet for Healthcare Providers: seriousbroker.it  This test is not yet approved or cleared by the [place]  FDA and has been authorized for detection and/or diagnosis of SARS-CoV-2 by FDA under an Emergency Use Authorization (EUA). This EUA will remain in effect (meaning this test can be used) for the duration of the COVID-19 declaration under Section 564(b)(1) of the Act, 21 U.S.C. section 360bbb-3(b)(1), unless the authorization is terminated or revoked.  Performed at [facility], [address] [place], [place] [address]     Labs: CBC: Recent Labs  Lab 02/18/24 1637 02/21/24 1255 02/23/24 0254  WBC 7.5 6.6 6.8  NEUTROABS  --  3.8  --   HGB 10.4* 10.2* 9.5*  HCT 31.3* 30.7* 28.8*  MCV 89 88.7 88.3  PLT 323 304 294   Basic Metabolic Panel: Recent Labs  Lab 02/21/24 1255 02/22/24 0023 02/22/24 0115 02/22/24 0553 02/22/24 1154 02/23/24 0254  NA 125* 130* 128* 131* 131* 131*  K 4.2 3.9 4.3 3.8  --  4.0  CL 92* 95* 95* 95*  --  100  CO2 21* 22 25 20*  --  23  GLUCOSE 97 91 96 98  --  81  BUN 39* 37* 38* 37*  --  34*  CREATININE 2.08* 1.91* 2.03* 1.22*  --  1.98*  CALCIUM  8.8* 8.8* 8.7* 9.0  --  8.7*   Liver Function Tests: Recent Labs  Lab 02/18/24 1637 02/21/24 1255  AST 20 21  ALT 13 16  ALKPHOS 92 78  BILITOT 0.5 0.7  PROT 6.3 6.5  ALBUMIN  4.2 3.7   CBG: Recent Labs  Lab 02/22/24 1331 02/22/24 1706 02/22/24 2047 02/23/24 0545 02/23/24 1132  GLUCAP 98 115* 89 91 94    Discharge time spent: greater than 30 minutes.  Signed: Dr. Sherre [facility] 02/23/2024

## 2024-02-26 ENCOUNTER — Telehealth: Payer: Self-pay | Admitting: Physician Assistant

## 2024-02-26 ENCOUNTER — Other Ambulatory Visit: Payer: Self-pay | Admitting: Physician Assistant

## 2024-02-26 DIAGNOSIS — E871 Hypo-osmolality and hyponatremia: Secondary | ICD-10-CM

## 2024-02-26 DIAGNOSIS — D649 Anemia, unspecified: Secondary | ICD-10-CM

## 2024-02-26 DIAGNOSIS — R748 Abnormal levels of other serum enzymes: Secondary | ICD-10-CM

## 2024-02-26 NOTE — Telephone Encounter (Addendum)
 Hospitalization reviewed, discharged 11/22 with treatment of hyponatremia and adjustment of medications. Discussed cath plan with Dr. Okey -> very tricky given the Thanksgiving holiday, timing of follow-up labs, potentially higher sodium meal holding diuretics. She advises that we hold off on cath on Friday, recheck BMET/CBC Monday to ensure values stable on updated regimen, and let that guide the timing of rescheduling cath.  I spoke with Delon Decent in the cath lab who cancelled case. BMET, CBC order entered.  Note recent OP Cr with [facility] was 2.41, 3.2, 2.46 prior to d/c of lisinopril . DC Cr 11/22 was 1.98 (agree 1.22 during hospitalization was likely spurious value as others suspected).   I called patient and got VM - left detailed message on machine per DPR, will also send Mychart msg.

## 2024-02-29 ENCOUNTER — Ambulatory Visit (HOSPITAL_COMMUNITY): Admission: RE | Admit: 2024-02-29 | Source: Home / Self Care | Admitting: Cardiology

## 2024-02-29 ENCOUNTER — Encounter (HOSPITAL_COMMUNITY): Admission: RE | Payer: Self-pay | Source: Home / Self Care

## 2024-02-29 SURGERY — RIGHT/LEFT HEART CATH AND CORONARY ANGIOGRAPHY
Anesthesia: LOCAL

## 2024-03-03 LAB — CBC
Hematocrit: 31 % — ABNORMAL LOW (ref 34.0–46.6)
Hemoglobin: 10 g/dL — ABNORMAL LOW (ref 11.1–15.9)
MCH: 29.2 pg (ref 26.6–33.0)
MCHC: 32.3 g/dL (ref 31.5–35.7)
MCV: 91 fL (ref 79–97)
Platelets: 419 x10E3/uL (ref 150–450)
RBC: 3.42 x10E6/uL — ABNORMAL LOW (ref 3.77–5.28)
RDW: 13.6 % (ref 11.7–15.4)
WBC: 8.7 x10E3/uL (ref 3.4–10.8)

## 2024-03-04 ENCOUNTER — Telehealth: Payer: Self-pay | Admitting: Physician Assistant

## 2024-03-04 ENCOUNTER — Ambulatory Visit: Payer: Self-pay | Admitting: Physician Assistant

## 2024-03-04 DIAGNOSIS — I2510 Atherosclerotic heart disease of native coronary artery without angina pectoris: Secondary | ICD-10-CM

## 2024-03-04 LAB — BASIC METABOLIC PANEL WITH GFR
BUN/Creatinine Ratio: 19 (ref 12–28)
BUN: 37 mg/dL — ABNORMAL HIGH (ref 8–27)
CO2: 19 mmol/L — ABNORMAL LOW (ref 20–29)
Calcium: 8.9 mg/dL (ref 8.7–10.3)
Chloride: 102 mmol/L (ref 96–106)
Creatinine, Ser: 1.92 mg/dL — ABNORMAL HIGH (ref 0.57–1.00)
Glucose: 107 mg/dL — ABNORMAL HIGH (ref 70–99)
Potassium: 4.6 mmol/L (ref 3.5–5.2)
Sodium: 136 mmol/L (ref 134–144)
eGFR: 26 mL/min/1.73 — ABNORMAL LOW (ref >59)

## 2024-03-04 LAB — CK: Total CK: 88 U/L (ref 26–161)

## 2024-03-04 NOTE — Telephone Encounter (Signed)
 Moving this to a phone note given need for cath:  1) Please let Regina [name] know good news, labs look stable to proceed with rescheduling her heart catheterization. Please schedule right and left heart catheterization with possible PCI for diagnosis of shortness of breath and abnormal stress test. No preference for doctor doing case. Please assist with relaying instructions. She will need to hold Lasix  day before and day of cath, and hold Jardiance  3 days prior.   2)  Elevated CK level has also resolved off atorvastatin  - would recommend referral to pharmacist lipid clinic to discuss management of cholesterol. Let me know when cath is scheduled for and I will place orders.  She has f/u scheduled 12/12 with Tessa as I did not have availability - will keep for now anticipating need for close post-cath kidney follow-up.

## 2024-03-04 NOTE — Telephone Encounter (Signed)
 Called patient and shared message from Dayna.  Patient is scheduled for right/left heart cath with Dr. Jordan on 03/07/24 at 10:30 AM.  Reviewed cath instructions and medication holds with patient (also sent via MyChart). Patient verbalized understanding.  Patient states she has anxiety and panic around procedure and would like to know if it would be OK for her to take 2-3 tabs of her Xanax  prior to coming in for cath on Friday.   Patient also asked if she would need to come in early on day of procedure for IV hydration.   Will forward to Dayna to review.

## 2024-03-05 ENCOUNTER — Telehealth: Payer: Self-pay | Admitting: *Deleted

## 2024-03-05 NOTE — Telephone Encounter (Signed)
 Cardiac Catheterization scheduled at [facility] for: Friday March 07, 2024 10:30 AM Arrival time [facility] Main Entrance A at: 5:30 AM-pre-procedure hydration  Diet: -Nothing to eat after midnight.  Hydration: -May drink clear liquids until 2 hours before the procedure. Approved liquids: Water, clear tea, black coffee, fruit juices-non-citric and without pulp,Gatorade, plain Jello/popsicles. No PO hydration (bottle of water)-going in early for IVF  Medication instructions: -Hold:  Lasix -day before and day of procedure -per protocol eGFR < 60 (26)  Jardiance -AM of procedure for cath. -Other usual morning medications can be taken including aspirin  81 mg.  Plan to go home the same day, you will only stay overnight if medically necessary.  You must have responsible adult to drive you home.  Someone must be with you the first 24 hours after you arrive home.  Reviewed procedure instructions/pre-procedure hydration with patient.   See Phone Note dated 03/04/24. I spoke with patient and she is aware that per Dr Jordan she can take one of her 0.25mg  Xanax  tablets prior to coming to the hospital. She will be getting Fentanyl  and Versed  for the cath - Versed  is in the same class as Xanax  so do not want her to take too much together.

## 2024-03-05 NOTE — Telephone Encounter (Signed)
 Yes, thank you so much for reaching back out to clarify - would advise to present 5 hours early to allow for 4 hours of IV fluids beforehand, no need for oral hydration prior to arrival to the hospital.  Arlean had a great message about this in the old 02/19/24 phone note and would advise duplicating this plan - ----- Message from Nurse Arlean CROME sent at 02/19/2024  9:43 AM EST ----- Regarding: RE: cath fyi No-if going in early for IV hydration, I do not ask them to extra drink water (16 or 8 oz) prior to arrival at hospital. They can still have clear liquids that morning until they leave home to go to hospital.   I also ask the cath lab to make a note that they are arriving early for pre-procedure hydration. Cath Lab asks that patients going in early for hydration arrive 5 hours before the procedure to allow 1 hour for registration and IV start and ~ 4 hours IV fluids before the procedure.   Thanks! Arlean

## 2024-03-06 NOTE — Telephone Encounter (Signed)
 I spoke with patient and she is aware that per Dr Jordan she can take one of her 0.25mg  Xanax  tablets prior to coming to the hospital. She will be getting Fentanyl  and Versed  for the cath - Versed  is in the same class as Xanax  so do not want her to take too much together.

## 2024-03-06 NOTE — Telephone Encounter (Addendum)
 Regina [name] - I also discussed Xanax  question with Dr. Jordan. Her Xanax  is written as 0.25mg  TID PRN anxiety. She had asked about taking 2-3 tablets before the cath.   Dr. Jordan said she can take one of her 0.25mg  tablets prior to coming to the hospital. She will be getting Fentanyl  and Versed  for the cath - Versed  is in the same class as Xanax  so do not want her to take too much together.

## 2024-03-07 ENCOUNTER — Encounter: Payer: Self-pay | Admitting: Physician Assistant

## 2024-03-07 ENCOUNTER — Telehealth: Payer: Self-pay | Admitting: Internal Medicine

## 2024-03-07 ENCOUNTER — Other Ambulatory Visit: Payer: Self-pay | Admitting: Physician Assistant

## 2024-03-07 ENCOUNTER — Other Ambulatory Visit: Payer: Self-pay

## 2024-03-07 ENCOUNTER — Telehealth: Payer: Self-pay | Admitting: Physician Assistant

## 2024-03-07 ENCOUNTER — Ambulatory Visit (HOSPITAL_COMMUNITY)
Admission: RE | Admit: 2024-03-07 | Discharge: 2024-03-07 | Disposition: A | Attending: Cardiology | Admitting: Cardiology

## 2024-03-07 ENCOUNTER — Encounter (HOSPITAL_COMMUNITY): Admission: RE | Disposition: A | Payer: Self-pay | Source: Home / Self Care | Attending: Cardiology

## 2024-03-07 DIAGNOSIS — D649 Anemia, unspecified: Secondary | ICD-10-CM

## 2024-03-07 DIAGNOSIS — R0609 Other forms of dyspnea: Secondary | ICD-10-CM

## 2024-03-07 DIAGNOSIS — N184 Chronic kidney disease, stage 4 (severe): Secondary | ICD-10-CM

## 2024-03-07 DIAGNOSIS — R9439 Abnormal result of other cardiovascular function study: Secondary | ICD-10-CM

## 2024-03-07 DIAGNOSIS — I2510 Atherosclerotic heart disease of native coronary artery without angina pectoris: Secondary | ICD-10-CM

## 2024-03-07 HISTORY — PX: RIGHT/LEFT HEART CATH AND CORONARY ANGIOGRAPHY: CATH118266

## 2024-03-07 LAB — POCT I-STAT EG7
Acid-base deficit: 3 mmol/L — ABNORMAL HIGH (ref 0.0–2.0)
Acid-base deficit: 4 mmol/L — ABNORMAL HIGH (ref 0.0–2.0)
Bicarbonate: 21.5 mmol/L (ref 20.0–28.0)
Bicarbonate: 22 mmol/L (ref 20.0–28.0)
Calcium, Ion: 1.12 mmol/L — ABNORMAL LOW (ref 1.15–1.40)
Calcium, Ion: 1.13 mmol/L — ABNORMAL LOW (ref 1.15–1.40)
HCT: 31 % — ABNORMAL LOW (ref 36.0–46.0)
HCT: 32 % — ABNORMAL LOW (ref 36.0–46.0)
Hemoglobin: 10.5 g/dL — ABNORMAL LOW (ref 12.0–15.0)
Hemoglobin: 10.9 g/dL — ABNORMAL LOW (ref 12.0–15.0)
O2 Saturation: 55 %
O2 Saturation: 61 %
Potassium: 3.9 mmol/L (ref 3.5–5.1)
Potassium: 4.1 mmol/L (ref 3.5–5.1)
Sodium: 136 mmol/L (ref 135–145)
Sodium: 138 mmol/L (ref 135–145)
TCO2: 23 mmol/L (ref 22–32)
TCO2: 23 mmol/L (ref 22–32)
pCO2, Ven: 38.2 mmHg — ABNORMAL LOW (ref 44–60)
pCO2, Ven: 39 mmHg — ABNORMAL LOW (ref 44–60)
pH, Ven: 7.348 (ref 7.25–7.43)
pH, Ven: 7.368 (ref 7.25–7.43)
pO2, Ven: 30 mmHg — CL (ref 32–45)
pO2, Ven: 33 mmHg (ref 32–45)

## 2024-03-07 LAB — POCT I-STAT 7, (LYTES, BLD GAS, ICA,H+H)
Acid-base deficit: 3 mmol/L — ABNORMAL HIGH (ref 0.0–2.0)
Bicarbonate: 21 mmol/L (ref 20.0–28.0)
Calcium, Ion: 1.09 mmol/L — ABNORMAL LOW (ref 1.15–1.40)
HCT: 30 % — ABNORMAL LOW (ref 36.0–46.0)
Hemoglobin: 10.2 g/dL — ABNORMAL LOW (ref 12.0–15.0)
O2 Saturation: 92 %
Potassium: 4 mmol/L (ref 3.5–5.1)
Sodium: 139 mmol/L (ref 135–145)
TCO2: 22 mmol/L (ref 22–32)
pCO2 arterial: 34.5 mmHg (ref 32–48)
pH, Arterial: 7.392 (ref 7.35–7.45)
pO2, Arterial: 65 mmHg — ABNORMAL LOW (ref 83–108)

## 2024-03-07 SURGERY — RIGHT/LEFT HEART CATH AND CORONARY ANGIOGRAPHY
Anesthesia: LOCAL

## 2024-03-07 MED ORDER — SODIUM CHLORIDE 0.9 % WEIGHT BASED INFUSION: 1.0000 mL/kg/h | INTRAVENOUS | Status: DC

## 2024-03-07 MED ORDER — LIDOCAINE HCL (PF) 1 % IJ SOLN
INTRAMUSCULAR | Status: DC | PRN
  Administered 2024-03-07: 5 mL via INTRADERMAL

## 2024-03-07 MED ORDER — SODIUM CHLORIDE 0.9% FLUSH: 3.0000 mL | Freq: Two times a day (BID) | INTRAVENOUS | Status: DC

## 2024-03-07 MED ORDER — CLOPIDOGREL BISULFATE 75 MG PO TABS: 75.0000 mg | ORAL_TABLET | Freq: Every day | ORAL | 11 refills | Status: AC

## 2024-03-07 MED ORDER — HYDRALAZINE HCL 20 MG/ML IJ SOLN: 10.0000 mg | INTRAMUSCULAR | Status: DC | PRN

## 2024-03-07 MED ORDER — MIDAZOLAM HCL 2 MG/2ML IJ SOLN
INTRAMUSCULAR | Status: AC
  Filled 2024-03-07: qty 2

## 2024-03-07 MED ORDER — HEPARIN (PORCINE) IN NACL 1000-0.9 UT/500ML-% IV SOLN
INTRAVENOUS | Status: DC | PRN
  Administered 2024-03-07: 1000 mL

## 2024-03-07 MED ORDER — IOHEXOL 350 MG/ML SOLN
INTRAVENOUS | Status: DC | PRN
  Administered 2024-03-07: 22 mL

## 2024-03-07 MED ORDER — ONDANSETRON HCL 4 MG/2ML IJ SOLN: 4.0000 mg | Freq: Four times a day (QID) | INTRAMUSCULAR | Status: DC | PRN

## 2024-03-07 MED ORDER — SODIUM CHLORIDE 0.9 % IV SOLN: 250.0000 mL | INTRAVENOUS | Status: DC | PRN

## 2024-03-07 MED ORDER — VERAPAMIL HCL 2.5 MG/ML IV SOLN
INTRAVENOUS | Status: DC | PRN
  Administered 2024-03-07: 10 mL via INTRA_ARTERIAL

## 2024-03-07 MED ORDER — ASPIRIN 81 MG PO TBEC: 81.0000 mg | DELAYED_RELEASE_TABLET | Freq: Every day | ORAL | 12 refills | Status: AC

## 2024-03-07 MED ORDER — SODIUM CHLORIDE 0.9% FLUSH: 3.0000 mL | INTRAVENOUS | Status: DC | PRN

## 2024-03-07 MED ORDER — FENTANYL CITRATE (PF) 100 MCG/2ML IJ SOLN
INTRAMUSCULAR | Status: DC | PRN
  Administered 2024-03-07 (×2): 25 ug via INTRAVENOUS

## 2024-03-07 MED ORDER — MIDAZOLAM HCL (PF) 2 MG/2ML IJ SOLN
INTRAMUSCULAR | Status: DC | PRN
  Administered 2024-03-07 (×2): 1 mg via INTRAVENOUS

## 2024-03-07 MED ORDER — HEPARIN SODIUM (PORCINE) 1000 UNIT/ML IJ SOLN
INTRAMUSCULAR | Status: AC
  Filled 2024-03-07: qty 10

## 2024-03-07 MED ORDER — HEPARIN SODIUM (PORCINE) 1000 UNIT/ML IJ SOLN
INTRAMUSCULAR | Status: DC | PRN
  Administered 2024-03-07: 3500 [IU] via INTRAVENOUS

## 2024-03-07 MED ORDER — ACETAMINOPHEN 325 MG PO TABS: 650.0000 mg | ORAL_TABLET | ORAL | Status: DC | PRN

## 2024-03-07 MED ORDER — VERAPAMIL HCL 2.5 MG/ML IV SOLN
INTRAVENOUS | Status: AC
  Filled 2024-03-07: qty 2

## 2024-03-07 MED ORDER — ASPIRIN 81 MG PO CHEW
324.0000 mg | CHEWABLE_TABLET | ORAL | Status: AC
  Administered 2024-03-07: 324 mg via ORAL
  Filled 2024-03-07: qty 4

## 2024-03-07 MED ORDER — LIDOCAINE HCL (PF) 1 % IJ SOLN
INTRAMUSCULAR | Status: AC
  Filled 2024-03-07: qty 30

## 2024-03-07 MED ORDER — FENTANYL CITRATE (PF) 100 MCG/2ML IJ SOLN
INTRAMUSCULAR | Status: AC
  Filled 2024-03-07: qty 2

## 2024-03-07 MED ORDER — SODIUM CHLORIDE 0.9 % WEIGHT BASED INFUSION
3.0000 mL/kg/h | INTRAVENOUS | Status: AC
  Administered 2024-03-07: 3 mL/kg/h via INTRAVENOUS

## 2024-03-07 SURGICAL SUPPLY — 11 items
CATH 5FR JL3.5 JR4 ANG PIG MP (CATHETERS) IMPLANT
CATH BALLN WEDGE 5F 110CM (CATHETERS) IMPLANT
DEVICE RAD COMP TR BAND LRG (VASCULAR PRODUCTS) IMPLANT
GLIDESHEATH SLEND SS 6F .021 (SHEATH) IMPLANT
GUIDEWIRE .025 260CM (WIRE) IMPLANT
GUIDEWIRE INQWIRE 1.5J.035X260 (WIRE) IMPLANT
PACK CARDIAC CATHETERIZATION (CUSTOM PROCEDURE TRAY) ×1 IMPLANT
SET ATX-X65L (MISCELLANEOUS) IMPLANT
SHEATH GLIDE SLENDER 4/5FR (SHEATH) IMPLANT
SHEATH PROBE COVER 6X72 (BAG) IMPLANT
WIRE HI TORQ VERSACORE-J 145CM (WIRE) IMPLANT

## 2024-03-07 NOTE — Telephone Encounter (Signed)
 Pt is requesting a callback regarding her wanting to discuss her medications with a nurse and get clarity. Please advise

## 2024-03-07 NOTE — Progress Notes (Signed)
 Opened in error. See phone note instead.

## 2024-03-07 NOTE — Interval H&P Note (Signed)
 History and Physical Interval Note:  03/07/2024 9:18 AM  Regina [name]  has presented today for surgery, with the diagnosis of abd normal stress test ; SOB.  The various methods of treatment have been discussed with the patient and family. After consideration of risks, benefits and other options for treatment, the patient has consented to  Procedure(s): RIGHT/LEFT HEART CATH AND CORONARY ANGIOGRAPHY (N/A) as a surgical intervention.  The patient's history has been reviewed, patient examined, no change in status, stable for surgery.  I have reviewed the patient's chart and labs.  Questions were answered to the patient's satisfaction.   Cath Lab Visit (complete for each Cath Lab visit)  Clinical Evaluation Leading to the Procedure:   ACS: No.  Non-ACS:    Anginal Classification: CCS II  Anti-ischemic medical therapy: Maximal Therapy (2 or more classes of medications)  Non-Invasive Test Results: High-risk stress test findings: cardiac mortality >3%/year  Prior CABG: No previous CABG        Maude [facility] 03/07/2024 9:19 AM

## 2024-03-07 NOTE — Telephone Encounter (Signed)
 I received update from Dr. Jordan that patient had diagnostic cath today and will be returning for staged PCI on Friday 03/14/24. He would like her to have repeat labs the day that she returns for this in short stay - will order BMET/CBC with pre-cath labs but Carley will also ensure Delon Decent is aware for team to draw when patient arrives. Dr. Jordan recommends arriving 4 hours prior to cath for 3 hours of IV hydration. Dr. Jordan reports that he advised the patient to hold her Lasix  and Jardiance  until then, to change ASA to 81mg  daily with addition of Plavix  75mg  daily - he is doing her med rec prior to her leaving the hospital. He has consented her for the staged PCI. We had also already discussed risks, benefits and alternatives in prior conversation. Will cc to Arlean Lipps as FYI when she calls patient with instructions prior to procedure.  Since patient is still in short stay, I asked Trish to let patient know I rescheduled her 12/12 APP appointment to Dr. Okey on 03/21/24 at 3pm, arrival time 2:40pm.  Trish reports she's already taken care of sending cath request to pre-cert team.

## 2024-03-07 NOTE — Discharge Instructions (Addendum)
 Take ASA 81 mg daily  Add Plavix  75 mg daily

## 2024-03-09 ENCOUNTER — Encounter (HOSPITAL_COMMUNITY): Payer: Self-pay | Admitting: Cardiology

## 2024-03-10 ENCOUNTER — Emergency Department (HOSPITAL_BASED_OUTPATIENT_CLINIC_OR_DEPARTMENT_OTHER)
Admission: EM | Admit: 2024-03-10 | Discharge: 2024-03-10 | Disposition: A | Discharge status: Home / Self Care | Attending: Emergency Medicine | Admitting: Emergency Medicine

## 2024-03-10 ENCOUNTER — Other Ambulatory Visit: Payer: Self-pay

## 2024-03-10 ENCOUNTER — Other Ambulatory Visit: Payer: Self-pay | Admitting: Physician Assistant

## 2024-03-10 ENCOUNTER — Emergency Department (HOSPITAL_BASED_OUTPATIENT_CLINIC_OR_DEPARTMENT_OTHER): Admitting: Radiology

## 2024-03-10 DIAGNOSIS — D649 Anemia, unspecified: Secondary | ICD-10-CM

## 2024-03-10 DIAGNOSIS — R0602 Shortness of breath: Secondary | ICD-10-CM

## 2024-03-10 DIAGNOSIS — R600 Localized edema: Secondary | ICD-10-CM

## 2024-03-10 DIAGNOSIS — N184 Chronic kidney disease, stage 4 (severe): Secondary | ICD-10-CM

## 2024-03-10 LAB — COMPREHENSIVE METABOLIC PANEL WITH GFR
ALT: 9 U/L (ref 0–44)
AST: 17 U/L (ref 15–41)
Albumin: 4.2 g/dL (ref 3.5–5.0)
Alkaline Phosphatase: 93 U/L (ref 38–126)
Anion gap: 15 (ref 5–15)
BUN: 26 mg/dL — ABNORMAL HIGH (ref 8–23)
CO2: 20 mmol/L — ABNORMAL LOW (ref 22–32)
Calcium: 9.8 mg/dL (ref 8.9–10.3)
Chloride: 103 mmol/L (ref 98–111)
Creatinine, Ser: 1.71 mg/dL — ABNORMAL HIGH (ref 0.44–1.00)
GFR, Estimated: 29 mL/min — ABNORMAL LOW (ref >60)
Glucose, Bld: 103 mg/dL — ABNORMAL HIGH (ref 70–99)
Potassium: 4.2 mmol/L (ref 3.5–5.1)
Sodium: 139 mmol/L (ref 135–145)
Total Bilirubin: 0.6 mg/dL (ref 0.0–1.2)
Total Protein: 7 g/dL (ref 6.5–8.1)

## 2024-03-10 LAB — CBC WITH DIFFERENTIAL/PLATELET
Abs Immature Granulocytes: 0.03 K/uL (ref 0.00–0.07)
Basophils Absolute: 0.1 K/uL (ref 0.0–0.1)
Basophils Relative: 1 %
Eosinophils Absolute: 0.3 K/uL (ref 0.0–0.5)
Eosinophils Relative: 3 %
HCT: 26.7 % — ABNORMAL LOW (ref 36.0–46.0)
Hemoglobin: 8.5 g/dL — ABNORMAL LOW (ref 12.0–15.0)
Immature Granulocytes: 0 %
Lymphocytes Relative: 20 %
Lymphs Abs: 1.8 K/uL (ref 0.7–4.0)
MCH: 28.3 pg (ref 26.0–34.0)
MCHC: 31.8 g/dL (ref 30.0–36.0)
MCV: 89 fL (ref 80.0–100.0)
Monocytes Absolute: 1 K/uL (ref 0.1–1.0)
Monocytes Relative: 11 %
Neutro Abs: 6 K/uL (ref 1.7–7.7)
Neutrophils Relative %: 65 %
Platelets: 508 K/uL — ABNORMAL HIGH (ref 150–400)
RBC: 3 MIL/uL — ABNORMAL LOW (ref 3.87–5.11)
RDW: 14.3 % (ref 11.5–15.5)
WBC: 9.2 K/uL (ref 4.0–10.5)
nRBC: 0 % (ref 0.0–0.2)

## 2024-03-10 LAB — PRO BRAIN NATRIURETIC PEPTIDE: Pro Brain Natriuretic Peptide: 427 pg/mL — ABNORMAL HIGH (ref <300.0)

## 2024-03-10 LAB — TROPONIN T, HIGH SENSITIVITY
Troponin T High Sensitivity: 53 ng/L — ABNORMAL HIGH (ref 0–19)
Troponin T High Sensitivity: 52 ng/L — ABNORMAL HIGH (ref 0–19)

## 2024-03-10 LAB — OCCULT BLOOD X 1 CARD TO LAB, STOOL: Fecal Occult Bld: NEGATIVE

## 2024-03-10 MED ORDER — FUROSEMIDE 20 MG PO TABS
20.0000 mg | ORAL_TABLET | Freq: Once | ORAL | Status: AC
  Administered 2024-03-10: 20 mg via ORAL
  Filled 2024-03-10: qty 1

## 2024-03-10 NOTE — ED Provider Notes (Signed)
 [place] [facility]ERGENCY DEPARTMENT AT [facility] Provider Note   CSN: [id number] Arrival date & time: 03/10/24  1545     Patient presents with: Shortness of Breath   Regina [name] is a 83 y.o. female with history of PAD, hypothyroidism, CKD, type 2 diabetes, CAD scheduled for stent placement on 03/14/2024, presents with concern for bilateral leg swelling that has been ongoing for the past 5 days.  She reports that her cardiologist stopped her Lasix  in preparation for the coronary stent placement, and since then, she has have been having increased leg swelling.  She denies any associated pain with this.  Does report some increased shortness of breath with exertion that started 3 days ago.  Denies any chest pain.  No cough, fever, or chills.  She has been compliant with her Plavix .  Patient recently had a heart catheterization performed on 03/07/2024 with Cardiologist Dr. Peter [name]    Shortness of Breath      Prior to Admission medications   Medication Sig Start Date End Date Taking? Authorizing Provider  albuterol  (VENTOLIN  HFA) 108 (90 Base) MCG/ACT inhaler Inhale 2 puffs into the lungs every 6 (six) hours as needed for wheezing or shortness of breath (asthma/coughing). 10/03/23   [name], Regina SAUNDERS, MD  ALPRAZolam  (XANAX ) 0.25 MG tablet Take 1 tablet (0.25 mg total) by mouth 3 (three) times daily as needed for anxiety. Patient taking differently: Take 0.25 mg by mouth 3 (three) times daily as needed for anxiety. Patient usually takes one tablet at night 12/20/17   [name], Regina J, PA-C  aspirin  EC 81 MG tablet Take 1 tablet (81 mg total) by mouth daily. Swallow whole. 03/07/24   [name], Regina M, MD  atorvastatin  (LIPITOR) 20 MG tablet Take 20 mg by mouth daily.    [provider]  budesonide -glycopyrrolate -formoterol  (BREZTRI  AEROSPHERE) 160-9-4.8 MCG/ACT AERO inhaler Inhale 2 puffs into the lungs in the morning and at bedtime. 01/07/24   [name],  Regina SAUNDERS, MD  cholecalciferol  (VITAMIN D3) 25 MCG (1000 UNIT) tablet Take 1,000 Units by mouth daily.    [provider]  clopidogrel  (PLAVIX ) 75 MG tablet Take 1 tablet (75 mg total) by mouth daily. 03/07/24 03/07/25  [name], Regina M, MD  fexofenadine (ALLEGRA) 180 MG tablet Take 180 mg by mouth daily.    [provider]  fluticasone  (FLONASE ) 50 MCG/ACT nasal spray Place 1 spray into both nostrils daily as needed for allergies.    [provider]  gabapentin  (NEURONTIN ) 100 MG capsule Take 100 mg by mouth 2 (two) times daily. Patient taking differently: Take 100 mg by mouth daily. Per patient taking 100 and a 300 mg at night.    [provider]  levothyroxine  (SYNTHROID ) 75 MCG tablet Take 1 tablet (75 mcg total) by mouth daily before breakfast. 12/20/17   [name], Regina PARAS, PA-C  LOKELMA  10 g PACK packet Take 10 g by mouth 3 (three) times a week. 01/18/24   [provider]  Magnesium  200 MG TABS Take 200 mg by mouth daily.    [provider]  montelukast  (SINGULAIR ) 10 MG tablet TAKE 1 TABLET BY MOUTH AT BEDTIME 01/14/24   [name], Regina SAUNDERS, MD  polyethylene glycol (MIRALAX  / GLYCOLAX ) 17 g packet Take 17 g by mouth daily as needed for mild constipation.    [provider]  senna-docusate (SENOKOT-S) 8.6-50 MG tablet Take 1 tablet by mouth 2 (two) times daily. Patient taking differently: Take 1 tablet by mouth daily as needed for  mild constipation or moderate constipation. 12/20/17   [name], Regina PARAS, PA-C    Allergies: Ceclor [cefaclor], Naproxen, Sulfa antibiotics, Amlodipine, Augmentin [amoxicillin-pot clavulanate], Other, and Valsartan    Review of Systems  Respiratory:  Positive for shortness of breath.     Updated Vital Signs BP (!) 170/69   Pulse 83   Temp 98.6 F (37 C) (Oral)   Resp 14   SpO2 98%   Physical Exam Vitals and nursing note reviewed.  Constitutional:      General: She is not in acute distress.     Appearance: She is well-developed.  HENT:     Head: Normocephalic and atraumatic.  Eyes:     Conjunctiva/sclera: Conjunctivae normal.  Cardiovascular:     Rate and Rhythm: Normal rate and regular rhythm.     Heart sounds: No murmur heard.    Comments: Pedal pulses obtained bilaterally with use of Doppler.  Feet are appropriately warm to touch bilaterally.  Brisk cap refill in the toes of the right and left feet Pulmonary:     Effort: Pulmonary effort is normal. No respiratory distress.     Breath sounds: Normal breath sounds.     Comments: Talks in full sentences on room air without difficulty Abdominal:     Palpations: Abdomen is soft.     Tenderness: There is no abdominal tenderness.  Genitourinary:    Comments: RN performed with RN Marry present  Patient with brown appearing stool, no active rectal bleeding or melanotic stool Musculoskeletal:        General: No swelling.     Cervical back: Neck supple.     Comments: 2+ pitting edema in the legs bilaterally that extends from the feet up to the mid calf.  No overlying skin erythema or wounds.  No calf tenderness to palpation bilaterally.  Moves lower extremities without difficulty.  Skin:    General: Skin is warm and dry.     Capillary Refill: Capillary refill takes less than 2 seconds.  Neurological:     Mental Status: She is alert.  Psychiatric:        Mood and Affect: Mood normal.     (all labs ordered are listed, but only abnormal results are displayed) Labs Reviewed  CBC WITH DIFFERENTIAL/PLATELET - Abnormal; Notable for the following components:      Result Value   RBC 3.00 (*)    Hemoglobin 8.5 (*)    HCT 26.7 (*)    Platelets 508 (*)    All other components within normal limits  COMPREHENSIVE METABOLIC PANEL WITH GFR - Abnormal; Notable for the following components:   CO2 20 (*)    Glucose, Bld 103 (*)    BUN 26 (*)    Creatinine, Ser 1.71 (*)    GFR, Estimated 29 (*)    All other components within  normal limits  PRO BRAIN NATRIURETIC PEPTIDE - Abnormal; Notable for the following components:   Pro Brain Natriuretic Peptide 427.0 (*)    All other components within normal limits  TROPONIN T, HIGH SENSITIVITY - Abnormal; Notable for the following components:   Troponin T High Sensitivity 53 (*)    All other components within normal limits  OCCULT BLOOD X 1 CARD TO LAB, STOOL  TROPONIN T, HIGH SENSITIVITY    EKG: EKG Interpretation Date/Time:  Monday March 10 2024 16:02:43 EST Ventricular Rate:  87 PR Interval:  180 QRS Duration:  102 QT Interval:  353 QTC Calculation: 425 R Axis:   21  Text Interpretation: Sinus rhythm Low voltage, precordial leads nonspecific ST/T changes similar to Nov 2025 Confirmed by Freddi Hamilton [phone]) on 03/10/2024 4:18:22 PM  Radiology: DG Chest 2 View Result Date: 03/10/2024 CLINICAL DATA:  Short of breath, coronary artery disease EXAM: CHEST - 2 VIEW COMPARISON:  02/21/2024 FINDINGS: Frontal and lateral views of the chest demonstrate an unremarkable cardiac silhouette. No acute airspace disease, effusion, or pneumothorax. No acute bony abnormalities. IMPRESSION: 1. No acute intrathoracic process. Electronically Signed   By: Ozell Daring M.D.   On: 03/10/2024 17:28     Procedures   Medications Ordered in the ED  furosemide  (LASIX ) tablet 20 mg (20 mg Oral Given 03/10/24 1924)    Clinical Course as of 03/10/24 1924  Mon Mar 10, 2024  1854 Consulted with Dr. Tobie does not feel patient needs admission for her hemoglobin at this time.  He recommends reaching out to cardiology see if they would recommend admission for managing patient's fluid status/Lasix  inpatient [AF]  1907 Dr. Edison with cardiology was consulted, he recommends restarting patient's Lasix  at 20 mg daily and having her repeat an outpatient BMP to recheck her creatinine [AF]    Clinical Course User Index [AF] Veta Palma, PA-C                                 Medical  Decision Making Amount and/or Complexity of Data Reviewed Labs: ordered. Radiology: ordered.     Differential diagnosis includes but is not limited to CHF exacerbation from being off of lasix , pulmonary edema ACS, arrhythmia, aortic aneurysm, pericarditis, myocarditis, pericardial effusion, cardiac tamponade, musculoskeletal pain, GERD, Boerhaave's syndrome, DVT/PE, pneumonia, pleural effusion   ED Course:  Upon initial evaluation, patient is well-appearing, no acute distress.  Normal vitals aside from her elevated blood pressure 184/70.  She does have 2+ pitting edema bilaterally.  Pulses obtained bilaterally with Doppler, feet are warm and well-perfused bilaterally, no clinical concern for ischemia.  Lungs are clear to auscultation bilaterally   Labs Ordered: I Ordered, and personally interpreted labs.  The pertinent results include:   CBC with hemoglobin 8.5, this is a drop from 10.5 taken 3 days ago CMP with creatinine 1.71, at baseline.  Otherwise unremarkable proBNP elevated at 427 Initial troponin elevated 53, repeat pending Hemoccult negative  Imaging Studies ordered: I ordered imaging studies including chest x-ray I independently visualized the imaging with scope of interpretation limited to determining acute life threatening conditions related to emergency care. Imaging showed no acute abnormality I agree with the radiologist interpretation   Cardiac Monitoring: / EKG: The patient was maintained on a cardiac monitor.  I personally viewed and interpreted the cardiac monitored which showed an underlying rhythm of: Normal sinus rhythm   Consultations Obtained: I requested consultation with the hospitalist Dr. Tobie,  and discussed lab and imaging findings as well as pertinent plan - they recommend: He does not recommend admission for monitoring of hemoglobin at this time given no active source of bleeding. I requested consultation with the cardiology Dr. Lavona,  and  discussed lab and imaging findings as well as pertinent plan - they recommend: They recommend starting patient's Lasix  at 20 mg daily and rechecking CBC and BMP in 2 days to monitor hemoglobin and creatinine  Medications Given: Lasix   Upon re-evaluation, patient remains well appearing with stable vital.   Low concern for ACS at this time given no active chest pain.  However, initial  troponin was elevated at 53, awaiting repeat.  EKG with normal sinus rhythm and no ST changes. Chest x-ray without any acute abnormality. Low clinical concern for DVT or PE at this time given shortness of breath can be explained with her low hemoglobin and increased fluid on exam.  Patient without any pleuritic chest pain, hypoxia, or tachycardia to suggest PE. Patient appears clinically fluid overloaded on exam today with bilateral pitting edema.  This seems secondary to her Lasix  being discontinued.  Will restart patient's Lasix  at 20 mg daily per cardiology recommendations.  Do not feel she needs admission for fluid overload at this time given she is not hypoxic, no signs of pulmonary edema on chest x-ray. Patient's hemoglobin dropped to 8.5 down from 10.5 taken 3 days ago.  This could be contributing to patient's shortness of breath, but fairly close to baseline.  Feel her shortness of breath is more likely due to fluid overload.  No source of active bleeding identified.  Hemoccult negative.  We will have her closely monitor this with an patient CBC.    Impression: Bilateral pitting edema Anemia  Disposition:  Care of this patient signed out to oncoming ED provider Dr. Freddi to follow-up on repeat troponin. Disposition and treatment plan pending imaging results and clinical judgment of oncoming ED team.      Record Review: External records from outside source obtained and reviewed including cardiology notes     This chart was dictated using voice recognition software, Dragon. Despite the best efforts of  this provider to proofread and correct errors, errors may still occur which can change documentation meaning.       Final diagnoses:  Shortness of breath  Hemoglobin low  Edema of both legs    ED Discharge Orders     None          Veta Palma, [place] 03/10/24 1924    Freddi Hamilton, MD 03/10/24 [phone]

## 2024-03-10 NOTE — Telephone Encounter (Signed)
 Dr. Lavona (DOD) spoke with Dr. Freddi regarding ED workup. Per their discussion and Dr. Denver recommendation, patient is restarting Lasix  20mg  daily today, tomorrow and Wednesday with repeat BMET/CBC on Wednesday 12/10 with holding Lasix  Thursday onward and hopefully still plan for cath on Friday 12/12 as scheduled. Dr. Freddi will relay instructions to patient. Orders entered and released for labs.

## 2024-03-10 NOTE — Telephone Encounter (Signed)
 So sorry to hear. Unfortunately needs to return to ER for evaluation and labwork. Difficult to evaluate outpatient given history of severe kidney disease and recent medication adjustment post-cath. Need to exclude acute kidney issues post-cath.

## 2024-03-10 NOTE — Telephone Encounter (Signed)
 Patient reports her feet and legs are swelling, and she has swelling in her abdomen. She states she is elevating her legs as much as possible. She reports SOB with minimal activity.  Per note from Hyacinth Bring, PA-C:  Dr. Jordan recommends arriving 4 hours prior to cath for 3 hours of IV hydration. Dr. Jordan reports that he advised the patient to hold her Lasix  and Jardiance  until then, to change ASA to 81mg  daily with addition of Plavix  75mg  daily   Reviewed procedure date and time, and arrival time with patient. Reviewed medication holds. Patient is concerned about holding Lasix  with the swelling she is experiencing. She reports the swelling started 03/06/24 and has progressively gotten worse. She states it is pitting, and she cannot wear her shoes.  Will forward to Dr. Okey, Dr. Jordan and Dayna to review and advise.

## 2024-03-10 NOTE — Telephone Encounter (Signed)
 Patient returned RN's call.

## 2024-03-10 NOTE — Discharge Instructions (Addendum)
 The swelling in your legs is due to fluid buildup since you have been off of your Lasix .  The cardiologist on-call recommended restarting your Lasix .  Please take 1 tablet (20 mg) of Lasix  daily to help with your leg swelling.  You were given your first dose here tonight. Take 20 mg tomorrow (Tuesday) and Wednesday. Do not take any after that unless instructed by cardiology.  One of your blood counts called the hemoglobin is slightly lower than your baseline today.  The Cardiology team is arranging for you to get repeat bloodwork on Wednesday (12/10).  Please return to the ER for any worsening chest pain, shortness of breath, any other new or concerning symptoms

## 2024-03-10 NOTE — Telephone Encounter (Signed)
 Pt returning call

## 2024-03-10 NOTE — ED Triage Notes (Signed)
 Pt caox4 ambulatory c/o lower leg edema and SOB over the past few days since having cardiac cath done, scheduled to have stents placed this coming Friday. Was taken off Lasix  approx 10 days ago. NAD in triage.

## 2024-03-10 NOTE — Telephone Encounter (Signed)
 Left message for patient to call back

## 2024-03-10 NOTE — Telephone Encounter (Signed)
 Spoke with patient, she states she is not thrilled to have to go to ER but will go to [facility] ED for evaluation.

## 2024-03-10 NOTE — Telephone Encounter (Signed)
 Left message for patient with Dayna's recommendation to go to ER. Requested callback to confirm she received message.

## 2024-03-12 LAB — CBC
Hematocrit: 34.1 % (ref 34.0–46.6)
Hemoglobin: 10.9 g/dL — ABNORMAL LOW (ref 11.1–15.9)
MCH: 29 pg (ref 26.6–33.0)
MCHC: 32 g/dL (ref 31.5–35.7)
MCV: 91 fL (ref 79–97)
Platelets: 446 x10E3/uL (ref 150–450)
RBC: 3.76 x10E6/uL — ABNORMAL LOW (ref 3.77–5.28)
RDW: 13.4 % (ref 11.7–15.4)
WBC: 7.8 x10E3/uL (ref 3.4–10.8)

## 2024-03-13 ENCOUNTER — Ambulatory Visit: Payer: Self-pay | Admitting: Physician Assistant

## 2024-03-13 ENCOUNTER — Telehealth: Payer: Self-pay | Admitting: *Deleted

## 2024-03-13 LAB — BASIC METABOLIC PANEL WITH GFR
BUN/Creatinine Ratio: 15 (ref 12–28)
BUN: 29 mg/dL — ABNORMAL HIGH (ref 8–27)
CO2: 20 mmol/L (ref 20–29)
Calcium: 9.5 mg/dL (ref 8.7–10.3)
Chloride: 102 mmol/L (ref 96–106)
Creatinine, Ser: 1.89 mg/dL — ABNORMAL HIGH (ref 0.57–1.00)
Glucose: 110 mg/dL — ABNORMAL HIGH (ref 70–99)
Potassium: 4.4 mmol/L (ref 3.5–5.2)
Sodium: 138 mmol/L (ref 134–144)
eGFR: 26 mL/min/1.73 — ABNORMAL LOW (ref >59)

## 2024-03-13 NOTE — Telephone Encounter (Addendum)
 Coronary Stent scheduled at [facility] for: Friday March 14, 2024 12 Noon Arrival time [facility] Main Entrance A at:8 AM-pre-procedure hydration  Diet: -May have light meal until 6 AM. (6 hours before procedure time) Approved light meal consists of plain toast, fruit, light soups, crackers.  Hydration: -May drink clear liquids until 2 hours before the procedure.  Approved liquids: Water, clear tea, black coffee, fruit juices-non-citric and without pulp,Gatorade, plain Jello/popsicles. No PO hydration (bottle of water) -going in early for IVF   Medication instructions: -Hold:  Lasix -day before and day of procedure- per protocol eGFR (26)  Jardiance -has been on hold since previous cath 03/07/24 -Other usual morning medications can be taken including aspirin  81 mg and Plavix  75 mg.  Plan to go home the same day, you will only stay overnight if medically necessary.  You must have responsible adult to drive you home.  Someone must be with you the first 24 hours after you arrive home.  Reviewed procedure instructions with patient.

## 2024-03-14 ENCOUNTER — Ambulatory Visit: Admitting: Physician Assistant

## 2024-03-14 ENCOUNTER — Other Ambulatory Visit: Payer: Self-pay

## 2024-03-14 ENCOUNTER — Encounter (HOSPITAL_COMMUNITY): Payer: Self-pay | Admitting: Cardiology

## 2024-03-14 ENCOUNTER — Ambulatory Visit (HOSPITAL_COMMUNITY)
Admission: RE | Admit: 2024-03-14 | Discharge: 2024-03-15 | Disposition: A | Attending: Cardiology | Admitting: Cardiology

## 2024-03-14 ENCOUNTER — Encounter (HOSPITAL_COMMUNITY): Admission: RE | Disposition: A | Payer: Self-pay | Attending: Cardiology

## 2024-03-14 DIAGNOSIS — D631 Anemia in chronic kidney disease: Secondary | ICD-10-CM | POA: Insufficient documentation

## 2024-03-14 DIAGNOSIS — I272 Pulmonary hypertension, unspecified: Secondary | ICD-10-CM | POA: Insufficient documentation

## 2024-03-14 DIAGNOSIS — Z7902 Long term (current) use of antithrombotics/antiplatelets: Secondary | ICD-10-CM | POA: Insufficient documentation

## 2024-03-14 DIAGNOSIS — N184 Chronic kidney disease, stage 4 (severe): Secondary | ICD-10-CM | POA: Diagnosis present

## 2024-03-14 DIAGNOSIS — Z79899 Other long term (current) drug therapy: Secondary | ICD-10-CM | POA: Insufficient documentation

## 2024-03-14 DIAGNOSIS — M791 Myalgia, unspecified site: Secondary | ICD-10-CM | POA: Insufficient documentation

## 2024-03-14 DIAGNOSIS — I25119 Atherosclerotic heart disease of native coronary artery with unspecified angina pectoris: Secondary | ICD-10-CM

## 2024-03-14 DIAGNOSIS — I129 Hypertensive chronic kidney disease with stage 1 through stage 4 chronic kidney disease, or unspecified chronic kidney disease: Secondary | ICD-10-CM | POA: Insufficient documentation

## 2024-03-14 DIAGNOSIS — I209 Angina pectoris, unspecified: Secondary | ICD-10-CM | POA: Diagnosis present

## 2024-03-14 DIAGNOSIS — I10 Essential (primary) hypertension: Secondary | ICD-10-CM | POA: Diagnosis present

## 2024-03-14 DIAGNOSIS — Z955 Presence of coronary angioplasty implant and graft: Secondary | ICD-10-CM

## 2024-03-14 DIAGNOSIS — Z7982 Long term (current) use of aspirin: Secondary | ICD-10-CM | POA: Insufficient documentation

## 2024-03-14 DIAGNOSIS — I340 Nonrheumatic mitral (valve) insufficiency: Secondary | ICD-10-CM | POA: Insufficient documentation

## 2024-03-14 HISTORY — PX: CORONARY STENT INTERVENTION: CATH118234

## 2024-03-14 LAB — POCT ACTIVATED CLOTTING TIME
Activated Clotting Time: 194 s
Activated Clotting Time: 281 s
Activated Clotting Time: 301 s
Activated Clotting Time: 194 s
Activated Clotting Time: 173 s

## 2024-03-14 MED ORDER — SODIUM CHLORIDE 0.9 % WEIGHT BASED INFUSION
3.0000 mL/kg/h | INTRAVENOUS | Status: AC
  Administered 2024-03-14: 3 mL/kg/h via INTRAVENOUS

## 2024-03-14 MED ORDER — GABAPENTIN 100 MG PO CAPS: 100.0000 mg | ORAL_CAPSULE | Freq: Two times a day (BID) | ORAL | Status: DC

## 2024-03-14 MED ORDER — FENTANYL CITRATE (PF) 100 MCG/2ML IJ SOLN
INTRAMUSCULAR | Status: DC | PRN
  Administered 2024-03-14 (×2): 25 ug via INTRAVENOUS

## 2024-03-14 MED ORDER — SODIUM CHLORIDE 0.9 % IV SOLN: 250.0000 mL | INTRAVENOUS | Status: DC | PRN

## 2024-03-14 MED ORDER — MIDAZOLAM HCL 2 MG/2ML IJ SOLN
INTRAMUSCULAR | Status: AC
  Filled 2024-03-14: qty 2

## 2024-03-14 MED ORDER — GABAPENTIN 300 MG PO CAPS
300.0000 mg | ORAL_CAPSULE | Freq: Every day | ORAL | Status: DC
  Administered 2024-03-14: 300 mg via ORAL
  Filled 2024-03-14: qty 1

## 2024-03-14 MED ORDER — SODIUM CHLORIDE 0.9 % WEIGHT BASED INFUSION: 1.0000 mL/kg/h | INTRAVENOUS | Status: DC

## 2024-03-14 MED ORDER — ALPRAZOLAM 0.25 MG PO TABS: 0.2500 mg | ORAL_TABLET | Freq: Three times a day (TID) | ORAL | Status: DC | PRN

## 2024-03-14 MED ORDER — HYDRALAZINE HCL 20 MG/ML IJ SOLN: 10.0000 mg | INTRAMUSCULAR | Status: AC | PRN

## 2024-03-14 MED ORDER — SODIUM CHLORIDE 0.9% FLUSH: 3.0000 mL | INTRAVENOUS | Status: DC | PRN

## 2024-03-14 MED ORDER — MAGNESIUM OXIDE -MG SUPPLEMENT 400 (240 MG) MG PO TABS
200.0000 mg | ORAL_TABLET | Freq: Every day | ORAL | Status: DC
  Administered 2024-03-15: 200 mg via ORAL
  Filled 2024-03-14: qty 1

## 2024-03-14 MED ORDER — SODIUM CHLORIDE 0.9 % WEIGHT BASED INFUSION
1.0000 mL/kg/h | INTRAVENOUS | Status: AC
  Administered 2024-03-14: 1 mL/kg/h via INTRAVENOUS

## 2024-03-14 MED ORDER — NITROGLYCERIN 1 MG/10 ML FOR IR/CATH LAB
INTRA_ARTERIAL | Status: AC
  Filled 2024-03-14: qty 10

## 2024-03-14 MED ORDER — MAGNESIUM 200 MG PO TABS: 200.0000 mg | ORAL_TABLET | Freq: Every day | ORAL | Status: DC

## 2024-03-14 MED ORDER — HEPARIN SODIUM (PORCINE) 1000 UNIT/ML IJ SOLN
INTRAMUSCULAR | Status: DC | PRN
  Administered 2024-03-14: 7000 [IU] via INTRAVENOUS
  Administered 2024-03-14: 3000 [IU] via INTRAVENOUS

## 2024-03-14 MED ORDER — ACETAMINOPHEN 325 MG PO TABS: 650.0000 mg | ORAL_TABLET | ORAL | Status: DC | PRN

## 2024-03-14 MED ORDER — LEVOTHYROXINE SODIUM 75 MCG PO TABS
75.0000 ug | ORAL_TABLET | Freq: Every day | ORAL | Status: DC
  Administered 2024-03-15: 75 ug via ORAL
  Filled 2024-03-14: qty 1

## 2024-03-14 MED ORDER — BUDESON-GLYCOPYRROL-FORMOTEROL 160-9-4.8 MCG/ACT IN AERO
2.0000 | INHALATION_SPRAY | Freq: Every day | RESPIRATORY_TRACT | Status: DC
  Administered 2024-03-14: 2 via RESPIRATORY_TRACT
  Filled 2024-03-14: qty 5.9

## 2024-03-14 MED ORDER — ASPIRIN 81 MG PO CHEW: 81.0000 mg | CHEWABLE_TABLET | ORAL | Status: DC

## 2024-03-14 MED ORDER — LORATADINE 10 MG PO TABS
10.0000 mg | ORAL_TABLET | Freq: Every day | ORAL | Status: DC
  Administered 2024-03-14 – 2024-03-15 (×2): 10 mg via ORAL
  Filled 2024-03-14 (×2): qty 1

## 2024-03-14 MED ORDER — NITROGLYCERIN 1 MG/10 ML FOR IR/CATH LAB
INTRA_ARTERIAL | Status: DC | PRN
  Administered 2024-03-14 (×2): 200 ug via INTRACORONARY

## 2024-03-14 MED ORDER — ONDANSETRON HCL 4 MG/2ML IJ SOLN: 4.0000 mg | Freq: Four times a day (QID) | INTRAMUSCULAR | Status: DC | PRN

## 2024-03-14 MED ORDER — CLOPIDOGREL BISULFATE 75 MG PO TABS
75.0000 mg | ORAL_TABLET | Freq: Every day | ORAL | Status: DC
  Administered 2024-03-15: 75 mg via ORAL
  Filled 2024-03-14: qty 1

## 2024-03-14 MED ORDER — IOHEXOL 350 MG/ML SOLN
INTRAVENOUS | Status: DC | PRN
  Administered 2024-03-14: 75 mL

## 2024-03-14 MED ORDER — GABAPENTIN 100 MG PO CAPS
100.0000 mg | ORAL_CAPSULE | Freq: Every day | ORAL | Status: DC
  Administered 2024-03-15: 100 mg via ORAL
  Filled 2024-03-14: qty 1

## 2024-03-14 MED ORDER — ASPIRIN 81 MG PO TBEC
81.0000 mg | DELAYED_RELEASE_TABLET | Freq: Every day | ORAL | Status: DC
  Administered 2024-03-15: 81 mg via ORAL
  Filled 2024-03-14: qty 1

## 2024-03-14 MED ORDER — LABETALOL HCL 5 MG/ML IV SOLN: 10.0000 mg | INTRAVENOUS | Status: AC | PRN

## 2024-03-14 MED ORDER — SODIUM CHLORIDE 0.9% FLUSH
3.0000 mL | Freq: Two times a day (BID) | INTRAVENOUS | Status: DC
  Administered 2024-03-14: 3 mL via INTRAVENOUS

## 2024-03-14 MED ORDER — FENTANYL CITRATE (PF) 100 MCG/2ML IJ SOLN
INTRAMUSCULAR | Status: AC
  Filled 2024-03-14: qty 2

## 2024-03-14 MED ADMIN — Midazolam HCl Inj PF 2 MG/2ML (Base Equivalent): 1 mg | INTRAVENOUS | NDC 00409000125

## 2024-03-14 MED ADMIN — Heparin Sod (Porcine)-NaCl IV Soln 1000 Unit/500ML-0.9%: 500 mL | NDC 00409762013

## 2024-03-14 MED ADMIN — Montelukast Sodium Tab 10 MG (Base Equiv): 10 mg | ORAL | NDC 29300022019

## 2024-03-14 MED ADMIN — Lidocaine HCl Local Preservative Free (PF) Inj 1%: 10 mL | INTRADERMAL | NDC 55150016330

## 2024-03-14 MED ADMIN — Atorvastatin Calcium Tab 10 MG (Base Equivalent): 20 mg | ORAL | NDC 00071015540

## 2024-03-14 MED FILL — Lidocaine HCl Local Preservative Free (PF) Inj 1%: INTRAMUSCULAR | Qty: 30 | Status: AC

## 2024-03-14 MED FILL — Atorvastatin Calcium Tab 10 MG (Base Equivalent): 20.0000 mg | ORAL | Qty: 2 | Status: AC

## 2024-03-14 MED FILL — Heparin Sodium (Porcine) Inj 1000 Unit/ML: INTRAMUSCULAR | Qty: 10 | Status: AC

## 2024-03-14 MED FILL — Montelukast Sodium Tab 10 MG (Base Equiv): 10.0000 mg | ORAL | Qty: 1 | Status: AC

## 2024-03-14 NOTE — Discharge Summary (Signed)
 Discharge Summary   Patient ID: Regina [name] MRN: [id number]; DOB: [birth year]  Admit date: 03/14/2024 Discharge date: 03/15/2024  PCP:  Dyane Anthony RAMAN, FNP   [facility] Providers Cardiologist:  Vina Gull, MD     Discharge Diagnoses  Principal Problem:   Angina pectoris Active Problems:   CAD (coronary artery disease)   PAD (peripheral artery disease)   Essential hypertension   Hyperlipidemia   CKD (chronic kidney disease), stage IV (HCC)   Chronic anemia   Diagnostic Studies/Procedures   Coronary Stent Intervention 03/14/2024:   Prox Cx to Mid Cx lesion is 90% stenosed.   Mid RCA lesion is 90% stenosed.   A drug-eluting stent was successfully placed using a STENT SYNERGY XD 2.50X12.   A drug-eluting stent was successfully placed using a STENT SYNERGY XD 2.75X16.   Post intervention, there is a 0% residual stenosis.   Post intervention, there is a 0% residual stenosis.   Recommend uninterrupted dual antiplatelet therapy with Aspirin  81mg  daily and Clopidogrel  75mg  daily for a minimum of 6 months (stable ischemic heart disease-Class I recommendation).   Successful PCI of the proximal LCx with DES Successful PCI of the mid RCA with DES   Plan: DAPT for at least 6 months. Anticipate DC in am. Can resume lasix  and Farxiga on DC.   Diagnostic Dominance: Right  Intervention      _____________   History of Present Illness   Regina [name] is a 83 y.o. female with CAD, vasovagal syncope, mild mitral regurgitation,  PAD s/p right common femoral artery endarterectomy in 08/2023, hypertension, hyperlipidemia, CKD stage IV, anemia, arthritis, and depression. She was referred to Dr. Gull in 11/2023 for further evaluation of dyspnea on exertion. Echo in 01/2024 showed LVEF of 65-70% with normal wall motion and diastolic parameters, normal RV function with moderately elevated RVSP of 47.9 mmHg, and mild MR. Cardiac PET stress test showed a  moderate fixed defect in the basal to apical inferior/ inferolateral segments consistent with infarction and peri-infarct ischemia. This led to a R/ LHC on 03/07/2024 which showed 90% stenosis of mid RCA and 90% stenosis of proximal to mid LCX as well as normal right and left filling pressures. Staged PCI was recommended given underlying CKD. This was scheduled for 03/14/2024.  Hospital Course   Consultants: None   Patient [facility] as planned and underwent successful PCI with DES to both the mid RCA and proximal LCx. She tolerated the procedure well but was admitted overnight for observation. Plan is for uninterrupted DAPT with Aspirin  81mg  daily and Plavix  75mg  daily for at least 6 months. She was previously on a statin but this was recently stopped due to myalgias and elevated CK level that resolved off statin. Will continue to hold statin for now and will refer to [facility] to discuss alternative options as previously planned. Will resume home Lasix  and Lokelma  at discharge. Continue all other home medications. Instructions/ precautions regarding cath site care were given prior to discharge. Will repeat BMET on Monday 03/17/2024 to recheck renal function and potassium level after contrast dye load.  Patient was seen and examined by Dr. Pietro today and felt to be stable for discharge. Outpatient follow-up arranged. Medications as below.  Did the patient have an acute coronary syndrome (MI, NSTEMI, STEMI, etc) this admission?:  No  Did the patient have a percutaneous coronary intervention (stent / angioplasty)?:  Yes.     Cath/PCI Registry Performance & Quality Measures: Aspirin  prescribed? - Yes ADP Receptor Inhibitor (Plavix /Clopidogrel , Brilinta/Ticagrelor or Effient/Prasugrel) prescribed (includes medically managed patients)? - Yes High Intensity Statin (Lipitor 40-80mg  or Crestor 20-40mg ) prescribed? - No - statin  recently stopped due to myalgias and elevated CK. Will refer to Lipid Clinic. For EF <40%, was ACEI/ARB prescribed? - Not Applicable (EF >/= 40%) For EF <40%, Aldosterone Antagonist (Spironolactone or Eplerenone) prescribed? - Not Applicable (EF >/= 40%) Cardiac Rehab Phase II ordered? - Yes  _____________  Discharge Vitals Blood pressure (!) 149/52, pulse 71, temperature 97.9 F (36.6 C), temperature source Oral, resp. rate 16, height 5' 2 (1.575 m), weight 70.3 kg, SpO2 99%.  Filed Weights   03/14/24 0843  Weight: 70.3 kg    Labs & Radiologic Studies  CBC Recent Labs    03/12/24 1432 03/15/24 0331  WBC 7.8 7.2  HGB 10.9* 9.1*  HCT 34.1 28.0*  MCV 91 88.9  PLT 446 344   Basic Metabolic Panel Recent Labs    87/89/74 1436 03/15/24 0331  NA 138 137  K 4.4 4.5  CL 102 110  CO2 20 23  GLUCOSE 110* 99  BUN 29* 26*  CREATININE 1.89* 1.80*  CALCIUM  9.5 8.7*   Liver Function Tests No results for input(s): AST, ALT, ALKPHOS, BILITOT, PROT, ALBUMIN  in the last 72 hours. No results for input(s): LIPASE, AMYLASE in the last 72 hours. High Sensitivity Troponin:   No results for input(s): TROPONINIHS in the last 720 hours.  Recent Labs  Lab 03/10/24 1629 03/10/24 1823  TRNPT 53* 52*    BNP Invalid input(s): POCBNP No results for input(s): PROBNP in the last 72 hours.  No results for input(s): BNP in the last 72 hours.  D-Dimer No results for input(s): DDIMER in the last 72 hours. Hemoglobin A1C No results for input(s): HGBA1C in the last 72 hours. Fasting Lipid Panel No results for input(s): CHOL, HDL, LDLCALC, TRIG, CHOLHDL, LDLDIRECT in the last 72 hours. Lipoprotein (a)  Date/Time Value Ref Range Status  02/18/2024 04:37 PM <8.4 <75.0 nmol/L Final    Comment:    **Results verified by repeat testing** Note:  Values greater than or equal to 75.0 nmol/L may        indicate an independent risk factor for CHD,        but  must be evaluated with caution when applied        to non-Caucasian populations due to the        influence of genetic factors on Lp(a) across        ethnicities.     Thyroid Function Tests No results for input(s): TSH, T4TOTAL, T3FREE, THYROIDAB in the last 72 hours.  Invalid input(s): FREET3 _____________  CARDIAC CATHETERIZATION Result Date: 03/14/2024   Prox Cx to Mid Cx lesion is 90% stenosed.   Mid RCA lesion is 90% stenosed.   A drug-eluting stent was successfully placed using a STENT SYNERGY XD 2.50X12.   A drug-eluting stent was successfully placed using a STENT SYNERGY XD 2.75X16.   Post intervention, there is a 0% residual stenosis.   Post intervention, there is a 0% residual stenosis.   Recommend uninterrupted dual antiplatelet therapy with Aspirin  81mg  daily and Clopidogrel  75mg  daily for a minimum of 6 months (stable ischemic heart disease-Class I recommendation). Successful PCI of the proximal LCx with DES Successful PCI of the mid  RCA with DES Plan: DAPT for at least 6 months. Anticipate DC in am. Can resume lasix  and Farxiga on DC.   DG Chest 2 View Result Date: 03/10/2024 CLINICAL DATA:  Short of breath, coronary artery disease EXAM: CHEST - 2 VIEW COMPARISON:  02/21/2024 FINDINGS: Frontal and lateral views of the chest demonstrate an unremarkable cardiac silhouette. No acute airspace disease, effusion, or pneumothorax. No acute bony abnormalities. IMPRESSION: 1. No acute intrathoracic process. Electronically Signed   By: Ozell Daring M.D.   On: 03/10/2024 17:28   CARDIAC CATHETERIZATION Result Date: 03/07/2024   Mid RCA lesion is 90% stenosed.   Prox Cx to Mid Cx lesion is 90% stenosed.   LV end diastolic pressure is normal. Severe 2 vessel obstructive CAD with focal lesions in the mid RCA and proximal LCx Normal LV filling pressures. LVEDP 10 mm Hg. PCWP 13/10, mean 9 mm Hg Normal right heart pressures. PAP 44/14, mean 24 mm Hg Cardiac output 5.48 L/min, index 3.15  Plan: recommend staged PCI of the RCA and LCx given renal function. Will plan on Friday Dec 12. Add Plavix  75 mg daily. Hold lasix  and Farxiga for now. Will repeat labs prior to PCI and hydrate prior. Will need femoral access for PCI since radial artery is quite small.   US  RENAL Result Date: 02/23/2024 EXAM: US  Retroperitoneum Complete, Renal. 02/23/2024 10:06:37 AM TECHNIQUE: Real-time ultrasonography of the retroperitoneum renal was performed. COMPARISON: None available CLINICAL HISTORY: [id number] Acute renal failure superimposed on stage 4 chronic kidney disease (HCC) 8596368 Acute renal failure superimposed on stage 4 chronic kidney disease (HCC) FINDINGS: FINDINGS: RIGHT KIDNEY/URETER: Right kidney measures 8.8 x 4.3 x 4.5 cm with a volume of 89.04 cc. Increased parenchymal echogenicity. There are cysts in the interpolar and lower pole of the right kidney. The largest is in the interpolar region, measuring 1.8 x 1.4 x 1.3 cm. No hydronephrosis. No calculus. No mass. LEFT KIDNEY/URETER: Left kidney measures 1.0 x 5.2 x 3.7 cm with a volume of 101.4 cc. Increased parenchymal echogenicity. Lower pole cyst measures 0.8 x 0.8 x 0.6 cm. No hydronephrosis. No calculus. No mass. BLADDER: The bladder appears normal with bilateral ureteral jets visualized. IMPRESSION: 1. Increased parenchymal echogenicity in both kidneys, compatible with chronic medical renal disease. 2. No signs of obstructive uropathy. Hey siri turn on the espresso machine. Electronically signed by: Waddell Calk MD 02/23/2024 10:28 AM EST RP Workstation: [id number]   DG Chest 2 View Result Date: 02/21/2024 CLINICAL DATA:  Shortness of breath EXAM: CHEST - 2 VIEW COMPARISON:  Chest x-ray 06/14/2023 FINDINGS: The heart size and mediastinal contours are within normal limits. Both lungs are clear. The visualized skeletal structures are unremarkable. IMPRESSION: No active cardiopulmonary disease. Electronically Signed   By: Greig Pique M.D.   On:  02/21/2024 19:32    Disposition Patient is being discharged home today in good condition.  Follow-up Plans & Appointments  Follow-up Information     Okey Vina GAILS, MD Follow up.   Specialty: Cardiology Why: Hospital follow-up with Dr. Okey scheduled for 03/21/2024 at 3:00pm. Please arrive 20 minute early for check-in. If this date/ time does not work for you, please call our office to reschedule. Contact information: [address] [place] [address] [phone]         [facility] A Dept of [facility] [employer] Follow up.   Specialty: Cardiology Why: Please come by office on Monday 03/17/2024 for repeat lab work so that  we can recheck your kidney function and potassium level. You do not have to have an appointment for this and do not have to be fasting. Contact information: [address] [place]  [address] [phone]               Discharge Instructions     AMB Referral to [facility]   Complete by: As directed    Developed myalgias and elevated CK with Lipitor. Need to discuss other options. Possible PCSK9 inhibitor.   Reason for referral: Patients with statin intolerance (failed 2 statins, one of which must be a high potency statin)   Provider to see patient: PharmD   Internal Lipid Clinic Referral Scheduling  Internal lipid clinic referrals are providers within [facility], who wish to refer established patients for routine management (help in starting PCSK9 inhibitor therapy) or advanced therapies.  Internal MD referral criteria:              1. All patients with LDL>190 mg/dL  2. All patients with Triglycerides >500 mg/dL  3. Patients with suspected or confirmed heterozygous familial hyperlipidemia (HeFH) or homozygous familial hyperlipidemia (HoFH)  4. Patients with family history of suspicious for genetic dyslipidemia desiring genetic testing  5. Patients refractory to standard guideline based  therapy  6. Patients with statin intolerance (failed 2 statins, one of which must be a high potency statin)  7. Patients who the provider desires to be seen by MD   Internal PharmD referral criteria:   1. Follow-up patients for medication management  2. Follow-up for compliance monitoring  3. Patients for drug education  4. Patients with statin intolerance  5. PCSK9 inhibitor education and prior authorization approvals  6. Patients with triglycerides <500 mg/dL  External Lipid Clinic Referral  External lipid clinic referrals are for providers outside of [facility], considered new clinic patients - automatically routed to MD schedule   Increase activity slowly   Complete by: As directed        Discharge Medications Allergies as of 03/15/2024       Reactions   Ceclor [cefaclor] Rash   Naproxen Hives, Swelling   Mouth swelling   Sulfa Antibiotics Nausea And Vomiting, Other (See Comments)   Syncope/headaches    Amlodipine Cough   Augmentin [amoxicillin-pot Clavulanate] Nausea And Vomiting   Has patient had a PCN reaction causing immediate rash, facial/tongue/throat swelling, SOB or lightheadedness with hypotension: No Has patient had a PCN reaction causing severe rash involving mucus membranes or skin necrosis: No Has patient had a PCN reaction that required hospitalization: No Has patient had a PCN reaction occurring within the last 10 years: No If all of the above answers are NO, then may proceed with Cephalosporin use.   Other Nausea And Vomiting   pain medications or opoids/Anesthesia   Valsartan Cough        Medication List     STOP taking these medications    atorvastatin  20 MG tablet Commonly known as: LIPITOR       TAKE these medications    albuterol  108 (90 Base) MCG/ACT inhaler Commonly known as: VENTOLIN  HFA Inhale 2 puffs into the lungs every 6 (six) hours as needed for wheezing or shortness of breath (asthma/coughing).   ALPRAZolam  0.25  MG tablet Commonly known as: Xanax  Take 1 tablet (0.25 mg total) by mouth 3 (three) times daily as needed for anxiety. What changed: additional instructions   aspirin  EC 81 MG tablet Take 1 tablet (81 mg total) by  mouth daily. Swallow whole.   Breztri  Aerosphere 160-9-4.8 MCG/ACT Aero inhaler Generic drug: budesonide -glycopyrrolate -formoterol  Inhale 2 puffs into the lungs in the morning and at bedtime.   cholecalciferol  25 MCG (1000 UNIT) tablet Commonly known as: VITAMIN D3 Take 1,000 Units by mouth daily.   clopidogrel  75 MG tablet Commonly known as: Plavix  Take 1 tablet (75 mg total) by mouth daily.   fexofenadine 180 MG tablet Commonly known as: ALLEGRA Take 180 mg by mouth daily.   fluticasone  50 MCG/ACT nasal spray Commonly known as: FLONASE  Place 1 spray into both nostrils daily as needed for allergies.   furosemide  40 MG tablet Commonly known as: LASIX  Take 20 mg by mouth daily.   gabapentin  100 MG capsule Commonly known as: NEURONTIN  Take 100 mg by mouth 2 (two) times daily. What changed:  when to take this additional instructions   levothyroxine  75 MCG tablet Commonly known as: Synthroid  Take 1 tablet (75 mcg total) by mouth daily before breakfast.   Lokelma  10 g Pack packet Generic drug: sodium zirconium cyclosilicate  Take 10 g by mouth 3 (three) times a week.   Magnesium  200 MG Tabs Take 200 mg by mouth daily.   montelukast  10 MG tablet Commonly known as: SINGULAIR  TAKE 1 TABLET BY MOUTH AT BEDTIME   nitroGLYCERIN  0.4 MG SL tablet Commonly known as: Nitrostat  Place 1 tablet (0.4 mg total) under the tongue every 5 (five) minutes as needed for chest pain.   polyethylene glycol 17 g packet Commonly known as: MIRALAX  / GLYCOLAX  Take 17 g by mouth daily as needed for mild constipation.   senna-docusate 8.6-50 MG tablet Commonly known as: Senokot-S Take 1 tablet by mouth 2 (two) times daily. What changed:  when to take this reasons to take  this         Outstanding Labs/Studies Will repeat BMET on Monday 03/17/2024.  Duration of Discharge Encounter: APP Time: 20 minutes   Signed, Benjamin Merrihew E Meldon Hanzlik, PA-C 03/15/2024, 7:43 AM

## 2024-03-14 NOTE — Progress Notes (Signed)
 Site area: Left Femoral Site Prior to Removal:  Level 0 Pressure Applied For: 40 min Manual: Yes Patient Status During Pull: Stable Post Pull Site:  Level 0 Post Pull Instructions Given: 4 Hr Post Pull Pulses Present: Yes left DP pulses present Dressing Applied: Yes Gauze and Tegaderm  Bedrest begins @ 1510 Comments:

## 2024-03-14 NOTE — Interval H&P Note (Signed)
 History and Physical Interval Note:  03/14/2024 10:55 AM  Regina [name]  has presented today for surgery, with the diagnosis of Schedule PCI.  The various methods of treatment have been discussed with the patient and family. After consideration of risks, benefits and other options for treatment, the patient has consented to  Procedures: CORONARY STENT INTERVENTION (N/A) as a surgical intervention.  The patient's history has been reviewed, patient examined, no change in status, stable for surgery.  I have reviewed the patient's chart and labs.  Questions were answered to the patient's satisfaction.   Cath Lab Visit (complete for each Cath Lab visit)  Clinical Evaluation Leading to the Procedure:   ACS: No.  Non-ACS:    Anginal Classification: CCS III  Anti-ischemic medical therapy: Minimal Therapy (1 class of medications)  Non-Invasive Test Results: High-risk stress test findings: cardiac mortality >3%/year  Prior CABG: No previous CABG        [facility] 03/14/2024 10:55 AM

## 2024-03-15 ENCOUNTER — Encounter (HOSPITAL_COMMUNITY): Payer: Self-pay | Admitting: Cardiology

## 2024-03-15 ENCOUNTER — Other Ambulatory Visit: Payer: Self-pay | Admitting: Student

## 2024-03-15 ENCOUNTER — Other Ambulatory Visit (HOSPITAL_COMMUNITY): Payer: Self-pay

## 2024-03-15 DIAGNOSIS — I2510 Atherosclerotic heart disease of native coronary artery without angina pectoris: Secondary | ICD-10-CM | POA: Diagnosis not present

## 2024-03-15 DIAGNOSIS — N184 Chronic kidney disease, stage 4 (severe): Secondary | ICD-10-CM

## 2024-03-15 DIAGNOSIS — E785 Hyperlipidemia, unspecified: Secondary | ICD-10-CM | POA: Insufficient documentation

## 2024-03-15 DIAGNOSIS — I739 Peripheral vascular disease, unspecified: Secondary | ICD-10-CM | POA: Insufficient documentation

## 2024-03-15 DIAGNOSIS — D649 Anemia, unspecified: Secondary | ICD-10-CM | POA: Insufficient documentation

## 2024-03-15 LAB — BASIC METABOLIC PANEL WITH GFR
Anion gap: 4 — ABNORMAL LOW (ref 5–15)
BUN: 26 mg/dL — ABNORMAL HIGH (ref 8–23)
CO2: 23 mmol/L (ref 22–32)
Calcium: 8.7 mg/dL — ABNORMAL LOW (ref 8.9–10.3)
Chloride: 110 mmol/L (ref 98–111)
Creatinine, Ser: 1.8 mg/dL — ABNORMAL HIGH (ref 0.44–1.00)
GFR, Estimated: 28 mL/min — ABNORMAL LOW (ref >60)
Glucose, Bld: 99 mg/dL (ref 70–99)
Potassium: 4.5 mmol/L (ref 3.5–5.1)
Sodium: 137 mmol/L (ref 135–145)

## 2024-03-15 LAB — CBC
HCT: 28 % — ABNORMAL LOW (ref 36.0–46.0)
Hemoglobin: 9.1 g/dL — ABNORMAL LOW (ref 12.0–15.0)
MCH: 28.9 pg (ref 26.0–34.0)
MCHC: 32.5 g/dL (ref 30.0–36.0)
MCV: 88.9 fL (ref 80.0–100.0)
Platelets: 344 K/uL (ref 150–400)
RBC: 3.15 MIL/uL — ABNORMAL LOW (ref 3.87–5.11)
RDW: 14.4 % (ref 11.5–15.5)
WBC: 7.2 K/uL (ref 4.0–10.5)
nRBC: 0 % (ref 0.0–0.2)

## 2024-03-15 MED ORDER — NITROGLYCERIN 0.4 MG SL SUBL: 0.4000 mg | SUBLINGUAL_TABLET | SUBLINGUAL | 2 refills | Status: AC | PRN

## 2024-03-15 MED ORDER — ORAL CARE MOUTH RINSE: 15.0000 mL | OROMUCOSAL | Status: DC | PRN

## 2024-03-15 MED ADMIN — Atorvastatin Calcium Tab 10 MG (Base Equivalent): 20 mg | ORAL | NDC 50268009311

## 2024-03-15 NOTE — Progress Notes (Signed)
 Rounding Note   Patient Name: Regina [name] Date of Encounter: 03/15/2024  [facility] Cardiologist: Vina Gull, MD   Subjective No CP or dyspnea  Scheduled Meds:  aspirin  EC  81 mg Oral Daily   atorvastatin   20 mg Oral Daily   budesonide -glycopyrrolate -formoterol   2 puff Inhalation QHS   clopidogrel   75 mg Oral Daily   gabapentin   100 mg Oral Daily   And   gabapentin   300 mg Oral QHS   levothyroxine   75 mcg Oral QAC breakfast   loratadine   10 mg Oral Daily   magnesium  oxide  200 mg Oral Daily   montelukast   10 mg Oral QHS   sodium chloride  flush  3 mL Intravenous Q12H   Continuous Infusions:  sodium chloride      PRN Meds: sodium chloride , acetaminophen , ALPRAZolam , ondansetron  (ZOFRAN ) IV, sodium chloride  flush   Vital Signs  Vitals:   03/14/24 1609 03/14/24 2011 03/14/24 2311 03/15/24 0416  BP: (!) 165/78 (!) 132/55 (!) 125/45 (!) 149/52  Pulse: 78  75 71  Resp: 16 18 16 16   Temp: 98 F (36.7 C) 98.9 F (37.2 C) 98.9 F (37.2 C) 97.9 F (36.6 C)  TempSrc: Oral Oral Oral Oral  SpO2: 100% 100% 97% 99%  Weight:      Height:        Intake/Output Summary (Last 24 hours) at 03/15/2024 0548 Last data filed at 03/14/2024 2321 Gross per 24 hour  Intake 360 ml  Output --  Net 360 ml      03/14/2024    8:43 AM 03/07/2024    8:00 AM 02/23/2024    4:49 AM  Last 3 Weights  Weight (lbs) 155 lb 158 lb 159 lb 8 oz  Weight (kg) 70.308 kg 71.668 kg 72.349 kg      Telemetry Sinus - Personally Reviewed  Physical Exam  GEN: No acute distress.   Neck: No JVD Cardiac: RRR, no murmurs, rubs, or gallops.  Respiratory: Clear to auscultation bilaterally. GI: Soft, nontender, non-distended  MS: No edema; femoral cath site with no hematoma. Neuro:  Nonfocal  Psych: Normal affect   Labs  Recent Labs  Lab 03/10/24 1629 03/10/24 1823  TRNPT 53* 52*       Chemistry Recent Labs  Lab 03/10/24 1629 03/12/24 1436 03/15/24 0331  NA 139  138 137  K 4.2 4.4 4.5  CL 103 102 110  CO2 20* 20 23  GLUCOSE 103* 110* 99  BUN 26* 29* 26*  CREATININE 1.71* 1.89* 1.80*  CALCIUM  9.8 9.5 8.7*  PROT 7.0  --   --   ALBUMIN  4.2  --   --   AST 17  --   --   ALT 9  --   --   ALKPHOS 93  --   --   BILITOT 0.6  --   --   GFRNONAA 29*  --  28*  ANIONGAP 15  --  4*     Hematology Recent Labs  Lab 03/10/24 1629 03/12/24 1432 03/15/24 0331  WBC 9.2 7.8 7.2  RBC 3.00* 3.76* 3.15*  HGB 8.5* 10.9* 9.1*  HCT 26.7* 34.1 28.0*  MCV 89.0 91 88.9  MCH 28.3 29.0 28.9  MCHC 31.8 32.0 32.5  RDW 14.3 13.4 14.4  PLT 508* 446 344    BNP Recent Labs  Lab 03/10/24 1629  PROBNP 427.0*     Radiology  CARDIAC CATHETERIZATION Result Date: 03/14/2024   Prox Cx to Mid Cx lesion is  90% stenosed.   Mid RCA lesion is 90% stenosed.   A drug-eluting stent was successfully placed using a STENT SYNERGY XD 2.50X12.   A drug-eluting stent was successfully placed using a STENT SYNERGY XD 2.75X16.   Post intervention, there is a 0% residual stenosis.   Post intervention, there is a 0% residual stenosis.   Recommend uninterrupted dual antiplatelet therapy with Aspirin  81mg  daily and Clopidogrel  75mg  daily for a minimum of 6 months (stable ischemic heart disease-Class I recommendation). Successful PCI of the proximal LCx with DES Successful PCI of the mid RCA with DES Plan: DAPT for at least 6 months. Anticipate DC in am. Can resume lasix  and Farxiga on DC.      Patient Profile   83 y.o. female with past medical history of chronic stage IV kidney disease, hypertension, hyperlipidemia, peripheral vascular disease, anemia admitted for cardiac catheterization.  Echocardiogram October 2025 showed normal LV function, mild left atrial enlargement, mild mitral regurgitation and moderate pulmonary hypertension.  Cardiac PET October 2025 high risk.  Patient admitted for cardiac catheterization given history of chronic stage IV kidney disease.  Cardiac  catheterization March 07, 2024 showed 90% mid RCA and 90% circumflex.  Patient underwent staged PCI on December 12 with PCI of both circumflex and mid right coronary artery with drug-eluting stents.  Assessment & Plan  1 coronary artery disease-patient is status post PCI of the left circumflex and right coronary artery.  Continue aspirin , Plavix  and statin.  2 chronic stage IV kidney disease-creatinine unchanged this morning.  Would recheck Monday morning given recent dye load.  Resume home dose of Lasix  and Lokelma  at discharge.  3 hyperlipidemia-continue Lipitor.  4 anemia-likely secondary to renal insufficiency.  5 hypertension-lisinopril  has been discontinued.  Resume home dose of Lasix  at discharge and follow-up.  Discharge today.  Follow-up with APP 2 weeks and Dr. Okey 3 months.  Check potassium and renal function on Monday with results to Dr. Okey.  Greater than 30 minutes physician time. D2   For questions or updates, please contact [facility] Please consult [email or web address] for contact info under       Signed, Redell Shallow, MD  03/15/2024, 5:48 AM

## 2024-03-15 NOTE — Plan of Care (Signed)
°  Problem: Activity: Goal: Ability to return to baseline activity level will improve Outcome: Progressing   Problem: Cardiovascular: Goal: Ability to achieve and maintain adequate cardiovascular perfusion will improve Outcome: Progressing Goal: Vascular access site(s) Level 0-1 will be maintained Outcome: Progressing   Problem: Clinical Measurements: Goal: Respiratory complications will improve Outcome: Progressing Goal: Cardiovascular complication will be avoided Outcome: Progressing   Problem: Activity: Goal: Risk for activity intolerance will decrease Outcome: Progressing   Problem: Nutrition: Goal: Adequate nutrition will be maintained Outcome: Progressing   Problem: Elimination: Goal: Will not experience complications related to urinary retention Outcome: Progressing   Problem: Safety: Goal: Ability to remain free from injury will improve Outcome: Progressing

## 2024-03-15 NOTE — Progress Notes (Signed)
 CARDIAC REHAB PHASE I   PRE:  Rate/Rhythm: Sinus 72  BP:  Supine: 136\52    SaO2: 985  MODE:  Ambulation: 400 ft   POST:  Rate/Rhythem: 81  BP:   Sitting: 148/96     SaO2: 95%  0820-0916 Ambulated in hallway using rollator. Tolerated well. Assisted back to chair with call bell within reach. Reviewed exercise instructions, temperature precautions. End points to exercise. Interested in [place] outpatient cardiac rehab. Has Stent card daughter took home. Reviewed heart healthy diet, plavix  sublingual nitroglycerin  and when to call 911.  Hadassah Elpidio Quan RN

## 2024-03-15 NOTE — Discharge Instructions (Signed)
 Post Cardiac Catheterization: NO HEAVY LIFTING OR SEXUAL ACTIVITY X 7 DAYS. NO DRIVING X 3-5 DAYS. NO SOAKING BATHS, HOT TUBS, POOLS, ETC., X 7 DAYS.   Groin Site Care: Refer to this sheet in the next few weeks. These instructions provide you with information on caring for yourself after your procedure. Your caregiver may also give you more specific instructions. Your treatment has been planned according to current medical practices, but problems sometimes occur. Call your caregiver if you have any problems or questions after your procedure. HOME CARE INSTRUCTIONS You may shower 24 hours after the procedure. Remove the bandage (dressing) and gently wash the site with plain soap and water. Gently pat the site dry.  Do not apply powder or lotion to the site.  Do not sit in a bathtub, swimming pool, or whirlpool for 5 to 7 days.  No bending, squatting, or lifting anything over 10 pounds (4.5 kg) as directed by your caregiver.  Inspect the site at least twice daily.  Do not drive home if you are discharged the same day of the procedure. Have someone else drive you.  You may drive 24 hours after the procedure unless otherwise instructed by your caregiver.  What to expect: Any bruising will usually fade within 1 to 2 weeks.  Blood that collects in the tissue (hematoma) may be painful to the touch. It should usually decrease in size and tenderness within 1 to 2 weeks.  SEEK IMMEDIATE MEDICAL CARE IF: You have unusual pain at the groin site or down the affected leg.  You have redness, warmth, swelling, or pain at the groin site.  You have drainage (other than a small amount of blood on the dressing).  You have chills.  You have a fever or persistent symptoms for more than 72 hours.  You have a fever and your symptoms suddenly get worse.  Your leg becomes pale, cool, tingly, or numb.  You have heavy bleeding from the site. Hold pressure on the site.

## 2024-03-18 ENCOUNTER — Ambulatory Visit: Payer: Self-pay | Admitting: Internal Medicine

## 2024-03-18 LAB — BASIC METABOLIC PANEL WITH GFR
BUN/Creatinine Ratio: 14 (ref 12–28)
BUN: 25 mg/dL (ref 8–27)
CO2: 17 mmol/L — ABNORMAL LOW (ref 20–29)
Calcium: 9.2 mg/dL (ref 8.7–10.3)
Chloride: 103 mmol/L (ref 96–106)
Creatinine, Ser: 1.74 mg/dL — ABNORMAL HIGH (ref 0.57–1.00)
Glucose: 98 mg/dL (ref 70–99)
Potassium: 4.6 mmol/L (ref 3.5–5.2)
Sodium: 138 mmol/L (ref 134–144)
eGFR: 29 mL/min/1.73 — ABNORMAL LOW (ref >59)

## 2024-03-19 ENCOUNTER — Telehealth (HOSPITAL_COMMUNITY): Payer: Self-pay

## 2024-03-19 NOTE — Telephone Encounter (Signed)
 Called patient to see if she was interested in cardiac Rehab. LVMTCB

## 2024-03-21 ENCOUNTER — Ambulatory Visit: Attending: Internal Medicine | Admitting: Internal Medicine

## 2024-03-21 ENCOUNTER — Encounter: Payer: Self-pay | Admitting: Internal Medicine

## 2024-03-21 VITALS — BP 138/64 | HR 74 | Ht 62.0 in | Wt 151.0 lb

## 2024-03-21 DIAGNOSIS — N184 Chronic kidney disease, stage 4 (severe): Secondary | ICD-10-CM | POA: Insufficient documentation

## 2024-03-21 DIAGNOSIS — R748 Abnormal levels of other serum enzymes: Secondary | ICD-10-CM | POA: Insufficient documentation

## 2024-03-21 DIAGNOSIS — R0609 Other forms of dyspnea: Secondary | ICD-10-CM | POA: Insufficient documentation

## 2024-03-21 MED ORDER — LISINOPRIL 10 MG PO TABS: 10.0000 mg | ORAL_TABLET | Freq: Every day | ORAL | 3 refills | Status: AC

## 2024-03-21 MED ORDER — FUROSEMIDE 20 MG PO TABS: ORAL_TABLET | ORAL | 3 refills | Status: DC

## 2024-03-21 NOTE — Progress Notes (Unsigned)
 "   Cardiology Office Note   Date:  03/21/2024   ID:  Regina [name], DOB September 17, 1940, MRN [id number]  PCP:  Dyane Anthony RAMAN, FNP  Cardiologist:   Vina Gull, MD   Pt presents for follow up of CAD   History of Present Illness: Regina [name] is a 83 y.o. female with a history of  CAD, mitral regurg, HTN, HL, CKD stage 3 and extensive PAD    Followed in [facility] with C Orval Splinter is s/p R common femoral endarterectomy   2019   Echo LVEF normal  Moderate MR Aug 2025  First visit to Cardiology  Noted increased SOB and fatigue    Set up for echo and Lexiscan  PET/CT Oct 2025  Echo   LVEF normal   Mild MR Oct 2025   Lexiscan  PET/CT  Abnormal  LVEF dropped with stress  TID present   Evid of inferior infarct with periinfarct ischemia    Flow after stress only 1.47 ml/g/min  Pt seen by renal    Had appt prior to R/L heart cath   Mar 07 2024  R/L heart cath    Normal filling pressures.  90% mRCA.  90% prox to mid LCx Mar 14 2024  Pt underwent PTCA/DES to LCx and mid RCA  Recomm ASA/Plavix  for a minimum of 6 months   Since seen she denies CP  Has some SOB since d/c Does says she feels tired out    She has not resumed lisinopril  yet    Current Meds  Medication Sig   albuterol  (VENTOLIN  HFA) 108 (90 Base) MCG/ACT inhaler Inhale 2 puffs into the lungs every 6 (six) hours as needed for wheezing or shortness of breath (asthma/coughing).   ALPRAZolam  (XANAX ) 0.25 MG tablet Take 1 tablet (0.25 mg total) by mouth 3 (three) times daily as needed for anxiety. (Patient taking differently: Take 0.25 mg by mouth 3 (three) times daily as needed for anxiety. Patient usually takes one tablet at night)   aspirin  EC 81 MG tablet Take 1 tablet (81 mg total) by mouth daily. Swallow whole.   budesonide -glycopyrrolate -formoterol  (BREZTRI  AEROSPHERE) 160-9-4.8 MCG/ACT AERO inhaler Inhale 2 puffs into the lungs in the morning and at bedtime.   cholecalciferol  (VITAMIN D3) 25 MCG (1000 UNIT)  tablet Take 1,000 Units by mouth daily.   clopidogrel  (PLAVIX ) 75 MG tablet Take 1 tablet (75 mg total) by mouth daily.   fexofenadine (ALLEGRA) 180 MG tablet Take 180 mg by mouth daily.   fluticasone  (FLONASE ) 50 MCG/ACT nasal spray Place 1 spray into both nostrils daily as needed for allergies.   furosemide  (LASIX ) 40 MG tablet Take 20 mg by mouth daily.   gabapentin  (NEURONTIN ) 100 MG capsule Take 100 mg by mouth 2 (two) times daily. (Patient taking differently: Take 100 mg by mouth daily. Per patient taking 100 and a 300 mg at night.)   levothyroxine  (SYNTHROID ) 75 MCG tablet Take 1 tablet (75 mcg total) by mouth daily before breakfast.   lisinopril  (ZESTRIL ) 10 MG tablet Take 10 mg by mouth daily.   LOKELMA  10 g PACK packet Take 10 g by mouth 3 (three) times a week.   Magnesium  200 MG TABS Take 200 mg by mouth daily.   montelukast  (SINGULAIR ) 10 MG tablet TAKE 1 TABLET BY MOUTH AT BEDTIME   nitroGLYCERIN  (NITROSTAT ) 0.4 MG SL tablet Place 1 tablet (0.4 mg total) under the tongue every 5 (five) minutes as needed for chest pain.   polyethylene  glycol (MIRALAX  / GLYCOLAX ) 17 g packet Take 17 g by mouth daily as needed for mild constipation.   senna-docusate (SENOKOT-S) 8.6-50 MG tablet Take 1 tablet by mouth 2 (two) times daily. (Patient taking differently: Take 1 tablet by mouth daily as needed for mild constipation or moderate constipation.)     Allergies:   Ceclor [cefaclor], Naproxen, Sulfa antibiotics, Amlodipine, Augmentin [amoxicillin-pot clavulanate], Other, and Valsartan   Past Medical History:  Diagnosis Date   Anxiety    Arthritis    neck   Asthma    Cervical spondylolysis    Chronic kidney disease    Stage IIIB   Depression    Hashimoto's disease    Hyperlipidemia    Hypertension    Insulin  resistance    PAD (peripheral artery disease)    Polycythemia    PONV (postoperative nausea and vomiting)    hasn't had surgery since 1993   Vasovagal syncope     Past  Surgical History:  Procedure Laterality Date   ABDOMINAL AORTOGRAM W/LOWER EXTREMITY N/A 07/19/2023   Procedure: ABDOMINAL AORTOGRAM W/LOWER EXTREMITY;  Surgeon: Gretta Lonni PARAS, MD;  Location: [facility] INVASIVE CV LAB;  Service: Cardiovascular;  Laterality: N/A;   ANTERIOR CERVICAL DECOMP/DISCECTOMY FUSION N/A 12/06/2017   Procedure: ANTERIOR CERVICAL DECOMPRESSION/DISCECTOMY FUSION, CERVICAL 3- CERVICAL 4, CERVICAL 4- CERVICAL 5, CERVICAL 5- CERVICAL 6;  Surgeon: Lanis Pupa, MD;  Location: [facility] OR;  Service: Neurosurgery;  Laterality: N/A;  ANTERIOR CERVICAL DECOMPRESSION/DISCECTOMY FUSION, CERVICAL THREE- CERVICAL FOUR, CERVICAL FOUR- CERVICAL FIVE, CERVICAL FIVE- CERVICAL SIX   APPENDECTOMY     BREAST SURGERY     left biopsy   CESAREAN SECTION     x2   CHOLECYSTECTOMY     CORONARY STENT INTERVENTION N/A 03/14/2024   Procedure: CORONARY STENT INTERVENTION;  Surgeon: Jordan, Peter M, MD;  Location: [facility] INVASIVE CV LAB;  Service: Cardiovascular;  Laterality: N/A;   DENTAL SURGERY     teeth removal   ENDARTERECTOMY FEMORAL Right 08/06/2023   Procedure: ENDARTERECTOMY, FEMORAL, RIGHT;  Surgeon: Gretta Lonni PARAS, MD;  Location: [facility] OR;  Service: Vascular;  Laterality: Right;   LOWER EXTREMITY ANGIOGRAPHY N/A 07/19/2023   Procedure: Lower Extremity Angiography;  Surgeon: Gretta Lonni PARAS, MD;  Location: [facility] INVASIVE CV LAB;  Service: Cardiovascular;  Laterality: N/A;   OVARY SURGERY     ovarian wedge   PATCH ANGIOPLASTY Right 08/06/2023   Procedure: PROFUNDOPLASTY, USING BOVINE PATCH GRAFT, RIGHT;  Surgeon: Gretta Lonni PARAS, MD;  Location: [facility] OR;  Service: Vascular;  Laterality: Right;   RIGHT/LEFT HEART CATH AND CORONARY ANGIOGRAPHY N/A 03/07/2024   Procedure: RIGHT/LEFT HEART CATH AND CORONARY ANGIOGRAPHY;  Surgeon: Jordan, Peter M, MD;  Location: [facility] INVASIVE CV LAB;  Service: Cardiovascular;  Laterality: N/A;   TONSILLECTOMY       Social History:  The patient  reports that she quit  smoking about 43 years ago. Her smoking use included cigarettes. She has never used smokeless tobacco. She reports current alcohol use. She reports that she does not use drugs.   Family History:  The patient's family history includes Asthma in her father and paternal grandfather; COPD in her mother; Heart disease in her father.    ROS:  Please see the history of present illness. All other systems are reviewed and  Negative to the above problem except as noted.    PHYSICAL EXAM: VS:  BP 138/64 (BP Location: Left Arm, Patient Position: Sitting, Cuff Size: Normal)   Pulse 74   Ht 5'  2 (1.575 m)   Wt 151 lb (68.5 kg)   SpO2 98%   BMI 27.62 kg/m   GEN: Well nourished, well developed, in no acute distress  HEENT: normal  Neck: no JVD, Cardiac: RRR;   I-II/VI systolic murmur LSB   Respiratory:  clear to auscultation bilaterally  No rales  GI: soft, nontender, no masses  No hepatomegaly  Ext  Tr LE edema  EKG:  EKG is not ordered today.  LHC   Nov 2025  Coronary Stent Intervention 03/14/2024:   Prox Cx to Mid Cx lesion is 90% stenosed.   Mid RCA lesion is 90% stenosed.   A drug-eluting stent was successfully placed using a STENT SYNERGY XD 2.50X12.   A drug-eluting stent was successfully placed using a STENT SYNERGY XD 2.75X16.   Post intervention, there is a 0% residual stenosis.   Post intervention, there is a 0% residual stenosis.   Recommend uninterrupted dual antiplatelet therapy with Aspirin  81mg  daily and Clopidogrel  75mg  daily for a minimum of 6 months (stable ischemic heart disease-Class I recommendation).   Successful PCI of the proximal LCx with DES Successful PCI of the mid RCA with DES   Plan: DAPT for at least 6 months.  Dec 2025  R/L heart cath    Mid RCA lesion is 90% stenosed.   Prox Cx to Mid Cx lesion is 90% stenosed.   LV end diastolic pressure is normal.   Severe 2 vessel obstructive CAD with focal lesions in the mid RCA and proximal LCx Normal LV filling  pressures. LVEDP 10 mm Hg. PCWP 13/10, mean 9 mm Hg Normal right heart pressures. PAP 44/14, mean 24 mm Hg Cardiac output 5.48 L/min, index 3.15   Plan: recommend staged PCI of the RCA and LCx given renal function.    Oct 2025  Lexiscan  PET/CT       Medium size, moderate, fixed defect in the basal to apical inferior/inferolateral segments which is worse on stress consistent with infarction and peri-infarct ischemia. LVEF drops with stress (74%->67%). TID is present (1.18). MBFR is globally reduced. Severe coronary calcifications. Findings are high risk and concerning for multivessel disease.   LV perfusion is abnormal. There is evidence of ischemia. There is evidence of infarction. Defect 1: There is a medium defect with moderate reduction in uptake present in the apical to basal inferior and inferolateral location(s) that is partially reversible. There is abnormal wall motion in the defect area. Consistent with infarction and peri-infarct ischemia.   Rest left ventricular function is normal. Rest EF: 74%. Stress left ventricular function is normal. Stress EF: 67%. End diastolic cavity size is normal.   Myocardial blood flow was computed to be 1.79ml/g/min at rest and 1.47ml/g/min at stress. Global myocardial blood flow reserve was 1.41 and was highly abnormal. MBF was corrected for baseline BP/HR.   Coronary calcium  was present on the attenuation correction CT images. Severe coronary calcifications were present. Coronary calcifications were present in the left anterior descending artery, left circumflex artery and right coronary artery distribution(s).   Findings are consistent with infarction with peri-infarct ischemia. The study is high risk.   Electronically signed by Darryle Decent, MD  Echo   Oct 2025   1. Left ventricular ejection fraction, by estimation, is 65 to 70%. Left  ventricular ejection fraction by 3D volume is 70 %. The left ventricle has  normal function. The left ventricle has  no regional wall motion  abnormalities. Left ventricular diastolic   parameters were normal.  2. Right ventricular systolic function is normal. The right ventricular  size is normal. There is moderately elevated pulmonary artery systolic  pressure. The estimated right ventricular systolic pressure is 47.9 mmHg.   3. Left atrial size was mildly dilated.   4. The mitral valve is normal in structure. Mild mitral valve  regurgitation. No evidence of mitral stenosis.   5. The aortic valve is tricuspid. Aortic valve regurgitation is not  visualized. No aortic stenosis is present.   6. The inferior vena cava is normal in size with greater than 50%  respiratory variability, suggesting right atrial pressure of 3 mmHg.   Lipid Panel    Component Value Date/Time   CHOL 102 08/07/2023 0500   TRIG 72 08/07/2023 0500   HDL 48 08/07/2023 0500   CHOLHDL 2.1 08/07/2023 0500   VLDL 14 08/07/2023 0500   LDLCALC 40 08/07/2023 0500      Wt Readings from Last 3 Encounters:  03/21/24 151 lb (68.5 kg)  03/14/24 155 lb (70.3 kg)  03/07/24 158 lb (71.7 kg)      ASSESSMENT AND PLAN:  1  CAD  Pt is now s/p PTCA/DES to LCx and RCA     Keep on ASA and Plavix       Will get labs on Wednesday Volume does appear to be a bit up   Will give 40 alt with 20 daily of lasix  for now  Check BMET and BNP next week  Wednesday    2   Mitral regurgitation  Mild on echo in Oct 2025  3  HTN  BP is up  Will add back lisinopril    Follow up labs next week Mitral regurgitation.   Moderate on echo in 2019  Murmur is not impressive. With previous echo would recomm repeat echo to reevaluate  4  HL  In May 2025  LDL was 40  HDL 48  Trig 72  Now in Nov LDL 75  HDL 80 Trig 52  WiIl need to review  ? What lead to change  5  Metabolics   A1C 5.8 in Aug  Reviwed diet  Cut back on carbs     Follow up in 4 wks   Current medicines are reviewed at length with the patient today.  The patient does not have concerns regarding  medicines.  Signed, Vina Gull, MD   "

## 2024-03-21 NOTE — Patient Instructions (Signed)
 Medication Instructions:   CHANGE FUROSEMIDE  TO 20 MG ALTERNATING WITH 40 MG EVERY OTHER DAY=1 TABLET ALTERNATING WITH 2 TABLETS EVERY OTHER DAY  RESTART LISINOPRIL  10 MG ONCE DAILY  *If you need a refill on your cardiac medications before your next appointment, please call your pharmacy*  Lab Work:  Your physician recommends that you return for lab work 03/26/24  If you have labs (blood work) drawn today and your tests are completely normal, you will receive your results only by: MyChart Message (if you have MyChart) OR A paper copy in the mail If you have any lab test that is abnormal or we need to change your treatment, we will call you to review the results.  Follow-Up: At [facility], you and your health needs are our priority.  As part of our continuing mission to provide you with exceptional heart care, our providers are all part of one team.  This team includes your primary Cardiologist (physician) and Advanced Practice Providers or APPs (Physician Assistants and Nurse Practitioners) who all work together to provide you with the care you need, when you need it.  Your next appointment:   4 week(s)  Provider:   Vina Gull, MD

## 2024-03-27 ENCOUNTER — Ambulatory Visit: Payer: Self-pay | Admitting: Internal Medicine

## 2024-03-27 DIAGNOSIS — N184 Chronic kidney disease, stage 4 (severe): Secondary | ICD-10-CM

## 2024-03-27 LAB — CBC
Hematocrit: 31.1 % — ABNORMAL LOW (ref 34.0–46.6)
Hemoglobin: 9.9 g/dL — ABNORMAL LOW (ref 11.1–15.9)
MCH: 28.4 pg (ref 26.6–33.0)
MCHC: 31.8 g/dL (ref 31.5–35.7)
MCV: 89 fL (ref 79–97)
Platelets: 332 x10E3/uL (ref 150–450)
RBC: 3.48 x10E6/uL — ABNORMAL LOW (ref 3.77–5.28)
RDW: 13.2 % (ref 11.7–15.4)
WBC: 6.9 x10E3/uL (ref 3.4–10.8)

## 2024-03-27 LAB — BASIC METABOLIC PANEL WITH GFR
BUN/Creatinine Ratio: 19 (ref 12–28)
BUN: 43 mg/dL — AB (ref 8–27)
CO2: 22 mmol/L (ref 20–29)
Calcium: 9.1 mg/dL (ref 8.7–10.3)
Chloride: 97 mmol/L (ref 96–106)
Creatinine, Ser: 2.31 mg/dL — AB (ref 0.57–1.00)
Glucose: 93 mg/dL (ref 70–99)
Potassium: 4.8 mmol/L (ref 3.5–5.2)
Sodium: 136 mmol/L (ref 134–144)
eGFR: 20 mL/min/1.73 — AB

## 2024-03-27 LAB — CK: Total CK: 60 U/L (ref 26–161)

## 2024-03-27 LAB — PRO B NATRIURETIC PEPTIDE: NT-Pro BNP: 274 pg/mL (ref 0–738)

## 2024-03-28 MED ORDER — FUROSEMIDE 20 MG PO TABS: 20.0000 mg | ORAL_TABLET | Freq: Every day | ORAL | Status: AC

## 2024-03-28 NOTE — Telephone Encounter (Signed)
 Okey Vina GAILS, MD to [facility] Triage  (Selected Message)    03/27/24 10:16 PM Result Note CBC is stable  Hgb 9.9 Cr has bumped to   2.3 (was 1.74)    Go back to 20 mg lasix  daily WOuld check BMET in 7 to 10 days   Left the information above over pt's voicemail.  S/w the pt's husband and went over the information above. He verbalized understanding.  Med list updated and order placed for BMET.

## 2024-04-04 NOTE — Telephone Encounter (Signed)
 Reviewed as Raphael Bring, PA out of office. Reasonable to have labs 04/09/23. Would recommend doing after her visit with PharmD in case additional labs needed. Appt notes updated as a reminder.   Will defer scheduling follow up with Dr. Okey to triage team.  Reche GORMAN Finder, NP

## 2024-04-20 NOTE — Progress Notes (Unsigned)
 "   Cardiology Office Note   Date:  04/22/2024   ID:  [name], [name] 07-13-1940, MRN [id number]  PCP:  Dyane Anthony RAMAN, FNP  Cardiologist:   Vina Gull, MD   Pt presents for follow up of CAD   History of Present Illness: Regina [name] is a 84 y.o. female with a history of  CAD, mitral regurg, HTN, HL, CKD stage 3 and extensive PAD    Followed in [facility] with C Clark (s/p R common femoral endarterectomy)    2019   Echo LVEF normal  Moderate MR Aug 2025  First visit to Cardiology  Noted increased SOB and fatigue    Set up for echo and Lexiscan  PET/CT Oct 2025  Echo   LVEF normal   Mild MR Oct 2025   Lexiscan  PET/CT  Abnormal  LVEF dropped with stress  TID present   Evid of inferior infarct with periinfarct ischemia    Flow after stress only 1.47 ml/g/mi  Mar 07 2024  R/L heart cath    Normal filling pressures.  90% mRCA.  90% prox to mid LCx  Given renal insuff plan for staged PCI Mar 14 2024  Pt underwent PTCA/DES to LCx and mid RCA  Recomm ASA/Plavix  for a minimum of 6 months    I saw the pt in Dec 2025  At that visit volume appeared to be up a bit   BP also up   Lasix  increased to 40 alt with 20 mg per day for a limited time    Back to 20 mg daily  Overall the pt says she is doing better   She deneis CP  Breathing is OK   Denies palpitations    Would like to enlist in cardiac rehab   She says that Dr Tobie stopped her atorvastatin     Not sure why   Current Meds  Medication Sig   albuterol  (VENTOLIN  HFA) 108 (90 Base) MCG/ACT inhaler Inhale 2 puffs into the lungs every 6 (six) hours as needed for wheezing or shortness of breath (asthma/coughing).   ALPRAZolam  (XANAX ) 0.25 MG tablet Take 1 tablet (0.25 mg total) by mouth 3 (three) times daily as needed for anxiety. (Patient taking differently: Take 0.25 mg by mouth 3 (three) times daily as needed for anxiety. Patient usually takes one tablet at night)   aspirin  EC 81 MG tablet Take 1 tablet (81 mg total)  by mouth daily. Swallow whole.   budesonide -glycopyrrolate -formoterol  (BREZTRI  AEROSPHERE) 160-9-4.8 MCG/ACT AERO inhaler Inhale 2 puffs into the lungs in the morning and at bedtime.   cholecalciferol  (VITAMIN D3) 25 MCG (1000 UNIT) tablet Take 1,000 Units by mouth daily.   clopidogrel  (PLAVIX ) 75 MG tablet Take 1 tablet (75 mg total) by mouth daily.   diphenhydrAMINE (BENADRYL ALLERGY) 25 mg capsule Take 2 capsules by mouth as needed. Taking for Allergies   empagliflozin  (JARDIANCE ) 10 MG TABS tablet Take 10 mg by mouth daily.   fexofenadine (ALLEGRA) 180 MG tablet Take 180 mg by mouth daily.   fluticasone  (FLONASE ) 50 MCG/ACT nasal spray Place 1 spray into both nostrils daily as needed for allergies.   furosemide  (LASIX ) 20 MG tablet Take 1 tablet (20 mg total) by mouth daily. TAKE 1 TABLET ALTERNATING WITH 2 TABLETS EVERY OTHER DAY   gabapentin  (NEURONTIN ) 100 MG capsule Take 100 mg by mouth 2 (two) times daily. (Patient taking differently: Take 100 mg by mouth 2 (two) times daily. [name])   levothyroxine  (SYNTHROID ) 75 MCG  tablet Take 1 tablet (75 mcg total) by mouth daily before breakfast.   lisinopril  (ZESTRIL ) 10 MG tablet Take 1 tablet (10 mg total) by mouth daily.   LOKELMA  10 g PACK packet Take 10 g by mouth 3 (three) times a week.   Magnesium  200 MG TABS Take 200 mg by mouth daily.   montelukast  (SINGULAIR ) 10 MG tablet TAKE 1 TABLET BY MOUTH AT BEDTIME   nitroGLYCERIN  (NITROSTAT ) 0.4 MG SL tablet Place 1 tablet (0.4 mg total) under the tongue every 5 (five) minutes as needed for chest pain.   polyethylene glycol (MIRALAX  / GLYCOLAX ) 17 g packet Take 17 g by mouth daily as needed for mild constipation.   senna-docusate (SENOKOT-S) 8.6-50 MG tablet Take 1 tablet by mouth 2 (two) times daily. (Patient taking differently: Take 1 tablet by mouth daily as needed for mild constipation or moderate constipation.)     Allergies:   Ceclor [cefaclor], Naproxen, Sulfa antibiotics, Amlodipine,  Augmentin [amoxicillin-pot clavulanate], Other, and Valsartan   Past Medical History:  Diagnosis Date   Anxiety    Arthritis    neck   Asthma    Cervical spondylolysis    Chronic kidney disease    Stage IIIB   Depression    Hashimoto's disease    Hyperlipidemia    Hypertension    Insulin  resistance    PAD (peripheral artery disease)    Polycythemia    PONV (postoperative nausea and vomiting)    hasn't had surgery since 1993   Vasovagal syncope     Past Surgical History:  Procedure Laterality Date   ABDOMINAL AORTOGRAM W/LOWER EXTREMITY N/A 07/19/2023   Procedure: ABDOMINAL AORTOGRAM W/LOWER EXTREMITY;  Surgeon: Gretta Lonni PARAS, MD;  Location: [facility] INVASIVE CV LAB;  Service: Cardiovascular;  Laterality: N/A;   ANTERIOR CERVICAL DECOMP/DISCECTOMY FUSION N/A 12/06/2017   Procedure: ANTERIOR CERVICAL DECOMPRESSION/DISCECTOMY FUSION, CERVICAL 3- CERVICAL 4, CERVICAL 4- CERVICAL 5, CERVICAL 5- CERVICAL 6;  Surgeon: Lanis Pupa, MD;  Location: [facility] OR;  Service: Neurosurgery;  Laterality: N/A;  ANTERIOR CERVICAL DECOMPRESSION/DISCECTOMY FUSION, CERVICAL THREE- CERVICAL FOUR, CERVICAL FOUR- CERVICAL FIVE, CERVICAL FIVE- CERVICAL SIX   APPENDECTOMY     BREAST SURGERY     left biopsy   CESAREAN SECTION     x2   CHOLECYSTECTOMY     CORONARY STENT INTERVENTION N/A 03/14/2024   Procedure: CORONARY STENT INTERVENTION;  Surgeon: Jordan, Peter M, MD;  Location: [facility] INVASIVE CV LAB;  Service: Cardiovascular;  Laterality: N/A;   DENTAL SURGERY     teeth removal   ENDARTERECTOMY FEMORAL Right 08/06/2023   Procedure: ENDARTERECTOMY, FEMORAL, RIGHT;  Surgeon: Gretta Lonni PARAS, MD;  Location: [facility] OR;  Service: Vascular;  Laterality: Right;   LOWER EXTREMITY ANGIOGRAPHY N/A 07/19/2023   Procedure: Lower Extremity Angiography;  Surgeon: Gretta Lonni PARAS, MD;  Location: [facility] INVASIVE CV LAB;  Service: Cardiovascular;  Laterality: N/A;   OVARY SURGERY     ovarian wedge   PATCH ANGIOPLASTY  Right 08/06/2023   Procedure: PROFUNDOPLASTY, USING BOVINE PATCH GRAFT, RIGHT;  Surgeon: Gretta Lonni PARAS, MD;  Location: [facility] OR;  Service: Vascular;  Laterality: Right;   RIGHT/LEFT HEART CATH AND CORONARY ANGIOGRAPHY N/A 03/07/2024   Procedure: RIGHT/LEFT HEART CATH AND CORONARY ANGIOGRAPHY;  Surgeon: Jordan, Peter M, MD;  Location: [facility] INVASIVE CV LAB;  Service: Cardiovascular;  Laterality: N/A;   TONSILLECTOMY       Social History:  The patient  reports that she quit smoking about 44 years ago. Her smoking use included  cigarettes. She has never used smokeless tobacco. She reports current alcohol use. She reports that she does not use drugs.   Family History:  The patient's family history includes Asthma in her father and paternal grandfather; COPD in her mother; Heart disease in her father.    ROS:  Please see the history of present illness. All other systems are reviewed and  Negative to the above problem except as noted.    PHYSICAL EXAM: VS:  BP (!) 144/80 (BP Location: Left Arm, Patient Position: Sitting, Cuff Size: Normal)   Pulse 62   Ht 5' 2 (1.575 m)   Wt 157 lb 9.6 oz (71.5 kg)   SpO2 98%   BMI 28.83 kg/m   GEN: Well nourished, well developed, in no acute distress  HEENT: normal  Neck: JVP is normal  Cardiac: RRR;   I-II/VI systolic murmur LSB   Respiratory:  clear to auscultation GI: soft, nontenderNo hepatomegaly  Ext  No  LE edema  EKG:  EKG is not ordered today.  LHC   Nov 2025  Coronary Stent Intervention 03/14/2024:   Prox Cx to Mid Cx lesion is 90% stenosed.   Mid RCA lesion is 90% stenosed.   A drug-eluting stent was successfully placed using a STENT SYNERGY XD 2.50X12.   A drug-eluting stent was successfully placed using a STENT SYNERGY XD 2.75X16.   Post intervention, there is a 0% residual stenosis.   Post intervention, there is a 0% residual stenosis.   Recommend uninterrupted dual antiplatelet therapy with Aspirin  81mg  daily and Clopidogrel  75mg   daily for a minimum of 6 months (stable ischemic heart disease-Class I recommendation).   Successful PCI of the proximal LCx with DES Successful PCI of the mid RCA with DES   Plan: DAPT for at least 6 months.  Dec 2025  R/L heart cath    Mid RCA lesion is 90% stenosed.   Prox Cx to Mid Cx lesion is 90% stenosed.   LV end diastolic pressure is normal.   Severe 2 vessel obstructive CAD with focal lesions in the mid RCA and proximal LCx Normal LV filling pressures. LVEDP 10 mm Hg. PCWP 13/10, mean 9 mm Hg Normal right heart pressures. PAP 44/14, mean 24 mm Hg Cardiac output 5.48 L/min, index 3.15   Plan: recommend staged PCI of the RCA and LCx given renal function.    Oct 2025  Lexiscan  PET/CT       Medium size, moderate, fixed defect in the basal to apical inferior/inferolateral segments which is worse on stress consistent with infarction and peri-infarct ischemia. LVEF drops with stress (74%->67%). TID is present (1.18). MBFR is globally reduced. Severe coronary calcifications. Findings are high risk and concerning for multivessel disease.   LV perfusion is abnormal. There is evidence of ischemia. There is evidence of infarction. Defect 1: There is a medium defect with moderate reduction in uptake present in the apical to basal inferior and inferolateral location(s) that is partially reversible. There is abnormal wall motion in the defect area. Consistent with infarction and peri-infarct ischemia.   Rest left ventricular function is normal. Rest EF: 74%. Stress left ventricular function is normal. Stress EF: 67%. End diastolic cavity size is normal.   Myocardial blood flow was computed to be 1.62ml/g/min at rest and 1.47ml/g/min at stress. Global myocardial blood flow reserve was 1.41 and was highly abnormal. MBF was corrected for baseline BP/HR.   Coronary calcium  was present on the attenuation correction CT images. Severe coronary calcifications were present.  Coronary calcifications were  present in the left anterior descending artery, left circumflex artery and right coronary artery distribution(s).   Findings are consistent with infarction with peri-infarct ischemia. The study is high risk.   Electronically signed by Darryle Decent, MD  Echo   Oct 2025   1. Left ventricular ejection fraction, by estimation, is 65 to 70%. Left  ventricular ejection fraction by 3D volume is 70 %. The left ventricle has  normal function. The left ventricle has no regional wall motion  abnormalities. Left ventricular diastolic   parameters were normal.   2. Right ventricular systolic function is normal. The right ventricular  size is normal. There is moderately elevated pulmonary artery systolic  pressure. The estimated right ventricular systolic pressure is 47.9 mmHg.   3. Left atrial size was mildly dilated.   4. The mitral valve is normal in structure. Mild mitral valve  regurgitation. No evidence of mitral stenosis.   5. The aortic valve is tricuspid. Aortic valve regurgitation is not  visualized. No aortic stenosis is present.   6. The inferior vena cava is normal in size with greater than 50%  respiratory variability, suggesting right atrial pressure of 3 mmHg.   Lipid Panel    Component Value Date/Time   CHOL 102 08/07/2023 0500   TRIG 72 08/07/2023 0500   HDL 48 08/07/2023 0500   CHOLHDL 2.1 08/07/2023 0500   VLDL 14 08/07/2023 0500   LDLCALC 40 08/07/2023 0500      Wt Readings from Last 3 Encounters:  04/22/24 157 lb 9.6 oz (71.5 kg)  03/21/24 151 lb (68.5 kg)  03/14/24 155 lb (70.3 kg)      ASSESSMENT AND PLAN:  1  CAD  Pt is now s/p PTCA/DES to LCx and RCA     Keep on ASA and Plavix       Doing good  No CP  Breathing is OK  Volume status looks good  Keep on ASA and Plavix     2   Mitral regurgitation  Mild on echo in Oct 2025  3  HTN  BP is mildly increased   I would follow for now   No changes in meds   Will get notes from Dr Tobie   4  HL LIpids in Dec LDL  75  HDL 80  Trig 52   Not sure what lead to stopping atorvastatin    WIll get records   5  Metabolics   A1C 5.8 in Aug  Reviwed diet again   Follow up in  May   Refer to cardiac rehab     Current medicines are reviewed at length with the patient today.  The patient does not have concerns regarding medicines.  Signed, Vina Gull, MD   "

## 2024-04-22 ENCOUNTER — Ambulatory Visit: Attending: Internal Medicine | Admitting: Internal Medicine

## 2024-04-22 ENCOUNTER — Encounter: Payer: Self-pay | Admitting: Internal Medicine

## 2024-04-22 VITALS — BP 144/80 | HR 62 | Ht 62.0 in | Wt 157.6 lb

## 2024-04-22 DIAGNOSIS — I2510 Atherosclerotic heart disease of native coronary artery without angina pectoris: Secondary | ICD-10-CM | POA: Diagnosis not present

## 2024-04-22 NOTE — Patient Instructions (Signed)
 Medication Instructions:  Your physician recommends that you continue on your current medications as directed. Please refer to the Current Medication list given to you today.  *If you need a refill on your cardiac medications before your next appointment, please call your pharmacy*  Lab Work: NONE    Testing/Procedures: Your physician has referred you to Cardiac Rehab.   Follow-Up: At [facility], you and your health needs are our priority.  As part of our continuing mission to provide you with exceptional heart care, our providers are all part of one team.  This team includes your primary Cardiologist (physician) and Advanced Practice Providers or APPs (Physician Assistants and Nurse Practitioners) who all work together to provide you with the care you need, when you need it.  Your next appointment:   4 month(s)  Provider:   Vina Gull, MD      Other Instructions

## 2024-04-30 NOTE — Progress Notes (Unsigned)
 Patient ID: Regina [name]                 DOB: 1940-06-30                    MRN: [id number]      HPI: Regina [name] is a 84 y.o. female patient referred to lipid clinic by Dr.Dunn. PMH is significant for  CAD, s/p PCI in Dec 2025, HTN, CKD stage 3, PAD  Patient was previously on Atorvastatin  20mg  daily but was  stopped in November 2025 due to elevated CK (462 U/L) and myalgias. A repeat CK lab in Dec 2025 showed CK decreased to 60 U/L. LDL 75 (Nov 20205)   Today she present to lipid clinic with her husband. Reports hse  has been off of atorvastatin  since last Nov. Her Reviewed options for lowering LDL cholesterol, including ezetimibe, PCSK-9 inhibitors, bempedoic acid and inclisiran.  Discussed mechanisms of action, dosing, side effects and potential decreases in LDL cholesterol.  Also reviewed cost information and potential options for patient assistance.  Given the patients CK elevation  from statin and history of stage 4 CKD it would be reasonable to select a lipid-lowering option with minimal impact on CK levels. A PCSK9 inhibitor is an appropriate alternative, as it has a low risk of myopathy and is generally safe in advanced CKD.  Current Medications: none  Intolerances: atorvastatin  20 mg daily - CK elevation  Risk Factors: CAD, s/p PCI in Dec 2025, PAD, HTN, CKD  LDL goal: <55  Diet: on restricted diet due to stage 4 CKD - follows nutritionist   Exercise: none due to lot of back and lower leg issue. Will be stating PT session   Family History:  Relation Problem Comments  Mother (Deceased) COPD     Father (Deceased) Asthma   Heart disease     Sister  Hert valve issue   Paternal Grandfather   Asthma      Social History:  Alcohol: none  Smoking: none   Labs:  Lipid Panel  LDL: 75 (Nov 2025)     Component Value Date/Time   CHOL 102 08/07/2023 0500   TRIG 72 08/07/2023 0500   HDL 48 08/07/2023 0500   CHOLHDL 2.1 08/07/2023 0500   VLDL 14  08/07/2023 0500   LDLCALC 40 08/07/2023 0500    Past Medical History:  Diagnosis Date   Anxiety    Arthritis    neck   Asthma    Cervical spondylolysis    Chronic kidney disease    Stage IIIB   Depression    Hashimoto's disease    Hyperlipidemia    Hypertension    Insulin  resistance    PAD (peripheral artery disease)    Polycythemia    PONV (postoperative nausea and vomiting)    hasn't had surgery since 1993   Vasovagal syncope     Medications Ordered Prior to Encounter[1]  Allergies[2]  Assessment/Plan:  1. Hyperlipidemia -    Assessment: LDL goal: < 55 mg/dl if not <44 given multiple risk factors  last LDLc 75 while pt was on moderate intensity statin ( atorvastatin  20 gm daily) Was not abel to tolerate atorvastatin  due to CK elevation  Discussed next potential options (PCSK-9 inhibitors, bempedoic acid and inclisiran); cost, dosing efficacy, side effects  Due to CKD on restricted diet and back pain restrict her mobility looking forward to start PT session soon   Plan: Will apply for PA for PCSK9i; will inform patient  upon approval (prefers MyChart message) Lipid lab due in 2-3 months after starting PCSK9i    Thank you,  Regina [name], Pharm.D [place] Regina [name]. [facility] [address], [place], [place] [address] Phone: [phone]; Fax: [phone]        [1]  Current Outpatient Medications on File Prior to Visit  Medication Sig Dispense Refill   albuterol  (VENTOLIN  HFA) 108 (90 Base) MCG/ACT inhaler Inhale 2 puffs into the lungs every 6 (six) hours as needed for wheezing or shortness of breath (asthma/coughing). 1 each 11   ALPRAZolam  (XANAX ) 0.25 MG tablet Take 1 tablet (0.25 mg total) by mouth 3 (three) times daily as needed for anxiety. (Patient taking differently: Take 0.25 mg by mouth 3 (three) times daily as needed for anxiety. Patient usually takes one tablet at night) 30 tablet 0   aspirin  EC 81 MG  tablet Take 1 tablet (81 mg total) by mouth daily. Swallow whole. 30 tablet 12   budesonide -glycopyrrolate -formoterol  (BREZTRI  AEROSPHERE) 160-9-4.8 MCG/ACT AERO inhaler Inhale 2 puffs into the lungs in the morning and at bedtime. 1 each 12   cholecalciferol  (VITAMIN D3) 25 MCG (1000 UNIT) tablet Take 1,000 Units by mouth daily.     clopidogrel  (PLAVIX ) 75 MG tablet Take 1 tablet (75 mg total) by mouth daily. 30 tablet 11   diphenhydrAMINE (BENADRYL ALLERGY) 25 mg capsule Take 2 capsules by mouth as needed. Taking for Allergies     empagliflozin  (JARDIANCE ) 10 MG TABS tablet Take 10 mg by mouth daily.     fexofenadine (ALLEGRA) 180 MG tablet Take 180 mg by mouth daily.     fluticasone  (FLONASE ) 50 MCG/ACT nasal spray Place 1 spray into both nostrils daily as needed for allergies.     furosemide  (LASIX ) 20 MG tablet Take 1 tablet (20 mg total) by mouth daily. TAKE 1 TABLET ALTERNATING WITH 2 TABLETS EVERY OTHER DAY     gabapentin  (NEURONTIN ) 100 MG capsule Take 100 mg by mouth 2 (two) times daily. (Patient taking differently: Take 100 mg by mouth 2 (two) times daily. [name])     levothyroxine  (SYNTHROID ) 75 MCG tablet Take 1 tablet (75 mcg total) by mouth daily before breakfast. 30 tablet 0   lisinopril  (ZESTRIL ) 10 MG tablet Take 1 tablet (10 mg total) by mouth daily. 90 tablet 3   LOKELMA  10 g PACK packet Take 10 g by mouth 3 (three) times a week.     Magnesium  200 MG TABS Take 200 mg by mouth daily.     montelukast  (SINGULAIR ) 10 MG tablet TAKE 1 TABLET BY MOUTH AT BEDTIME 30 tablet 3   nitroGLYCERIN  (NITROSTAT ) 0.4 MG SL tablet Place 1 tablet (0.4 mg total) under the tongue every 5 (five) minutes as needed for chest pain. 25 tablet 2   polyethylene glycol (MIRALAX  / GLYCOLAX ) 17 g packet Take 17 g by mouth daily as needed for mild constipation.     senna-docusate (SENOKOT-S) 8.6-50 MG tablet Take 1 tablet by mouth 2 (two) times daily. (Patient taking differently: Take 1 tablet by mouth daily as needed  for mild constipation or moderate constipation.)     No current facility-administered medications on file prior to visit.  [2]  Allergies Allergen Reactions   Ceclor [Cefaclor] Rash   Naproxen Hives and Swelling    Mouth swelling   Sulfa Antibiotics Nausea And Vomiting and Other (See Comments)    Syncope/headaches    Amlodipine Cough   Augmentin [Amoxicillin-Pot Clavulanate]  Nausea And Vomiting    Has patient had a PCN reaction causing immediate rash, facial/tongue/throat swelling, SOB or lightheadedness with hypotension: No Has patient had a PCN reaction causing severe rash involving mucus membranes or skin necrosis: No Has patient had a PCN reaction that required hospitalization: No Has patient had a PCN reaction occurring within the last 10 years: No If all of the above answers are NO, then may proceed with Cephalosporin use.    Other Nausea And Vomiting    pain medications or opoids/Anesthesia   Valsartan Cough

## 2024-05-01 ENCOUNTER — Telehealth: Payer: Self-pay | Admitting: Pharmacist

## 2024-05-01 ENCOUNTER — Ambulatory Visit: Attending: Cardiovascular Disease | Admitting: Pharmacist

## 2024-05-01 ENCOUNTER — Encounter: Payer: Self-pay | Admitting: Pharmacist

## 2024-05-01 DIAGNOSIS — E7849 Other hyperlipidemia: Secondary | ICD-10-CM

## 2024-05-01 DIAGNOSIS — I2510 Atherosclerotic heart disease of native coronary artery without angina pectoris: Secondary | ICD-10-CM | POA: Insufficient documentation

## 2024-05-02 ENCOUNTER — Other Ambulatory Visit (HOSPITAL_COMMUNITY): Payer: Self-pay

## 2024-05-02 ENCOUNTER — Telehealth: Payer: Self-pay | Admitting: Pharmacy Technician

## 2024-05-02 MED ORDER — REPATHA SURECLICK 140 MG/ML ~~LOC~~ SOAJ: 140.0000 mg | SUBCUTANEOUS | 3 refills | Status: AC

## 2024-05-02 NOTE — Telephone Encounter (Signed)
 Pt informed about PA approval. F/u lab due Aug 11, 2024

## 2024-05-02 NOTE — Telephone Encounter (Signed)
" ° °  Pharmacy Patient Advocate Encounter   Received notification from Pt Calls Messages that prior authorization for repatha  is required/requested.   Insurance verification completed.   The patient is insured through [employer] [facility].   Per test claim: PA required; PA submitted to above mentioned insurance via Latent Key/confirmation #/EOC [id number] Status is pending   Repatha  pa looked like she had to have praluent but praluent pa said:   So submitted repatha  first  Pharmacy Patient Advocate Encounter   Received notification from Pt Calls Messages that prior authorization for praluent is required/requested.   Insurance verification completed.   The patient is insured through [employer] [facility].   Per test claim: PA required; PA started via CoverMyMeds. KEY [id number] . Please see clinical question(s) below that I am not finding the answer to in their chart and advise.   Waiting on repatha  first "

## 2024-05-09 ENCOUNTER — Encounter (HOSPITAL_COMMUNITY): Payer: Self-pay

## 2024-05-09 ENCOUNTER — Telehealth (HOSPITAL_COMMUNITY): Payer: Self-pay

## 2024-05-09 NOTE — Telephone Encounter (Signed)
 Pt returned phone call and is interested in the cardiac rehab program. Will contact pt once March schedule is complete.

## 2024-05-09 NOTE — Telephone Encounter (Signed)
 Called pt for a 2nd time to see if interested in the cardiac rehab program and to see if she wants to schedule.   Sent letter

## 2024-08-04 ENCOUNTER — Ambulatory Visit: Admitting: Internal Medicine
